# Patient Record
Sex: Female | Born: 1965 | Race: Black or African American | Hispanic: No | Smoking: Former smoker
Health system: Southern US, Community
[De-identification: ages and names within clinical notes are randomized; demographics above are authoritative.]

## PROBLEM LIST (undated history)

## (undated) ENCOUNTER — Ambulatory Visit: Payer: Medicare Other | Source: Home / Self Care

## (undated) DIAGNOSIS — I5020 Unspecified systolic (congestive) heart failure: Secondary | ICD-10-CM

## (undated) DIAGNOSIS — I2510 Atherosclerotic heart disease of native coronary artery without angina pectoris: Secondary | ICD-10-CM

## (undated) DIAGNOSIS — I10 Essential (primary) hypertension: Secondary | ICD-10-CM

## (undated) DIAGNOSIS — E785 Hyperlipidemia, unspecified: Secondary | ICD-10-CM

## (undated) DIAGNOSIS — I471 Supraventricular tachycardia, unspecified: Secondary | ICD-10-CM

## (undated) DIAGNOSIS — I639 Cerebral infarction, unspecified: Secondary | ICD-10-CM

## (undated) HISTORY — PX: KNEE SURGERY: SHX244

## (undated) HISTORY — DX: Supraventricular tachycardia: I47.1

## (undated) HISTORY — PX: CARDIAC CATHETERIZATION: SHX172

## (undated) HISTORY — DX: Supraventricular tachycardia, unspecified: I47.10

---

## 2003-11-26 ENCOUNTER — Other Ambulatory Visit: Payer: Self-pay

## 2005-12-11 ENCOUNTER — Emergency Department: Payer: Self-pay | Admitting: Emergency Medicine

## 2007-06-30 ENCOUNTER — Ambulatory Visit: Payer: Self-pay | Admitting: General Practice

## 2007-10-03 ENCOUNTER — Inpatient Hospital Stay (HOSPITAL_COMMUNITY): Admission: AD | Admit: 2007-10-03 | Discharge: 2007-10-03 | Payer: Self-pay | Admitting: Obstetrics and Gynecology

## 2008-07-05 ENCOUNTER — Ambulatory Visit: Payer: Self-pay | Admitting: General Practice

## 2009-01-14 ENCOUNTER — Emergency Department: Payer: Self-pay | Admitting: Unknown Physician Specialty

## 2009-05-10 ENCOUNTER — Emergency Department (HOSPITAL_COMMUNITY): Admission: EM | Admit: 2009-05-10 | Discharge: 2009-05-10 | Payer: Self-pay | Admitting: Emergency Medicine

## 2009-07-11 ENCOUNTER — Ambulatory Visit: Payer: Self-pay | Admitting: General Practice

## 2010-11-24 ENCOUNTER — Emergency Department (HOSPITAL_COMMUNITY)
Admission: EM | Admit: 2010-11-24 | Discharge: 2010-11-24 | Disposition: A | Discharge status: Home / Self Care | Payer: BC Managed Care – PPO | Attending: Emergency Medicine | Admitting: Emergency Medicine

## 2010-11-24 ENCOUNTER — Emergency Department (HOSPITAL_COMMUNITY): Payer: BC Managed Care – PPO

## 2010-11-24 ENCOUNTER — Encounter (HOSPITAL_COMMUNITY): Payer: Self-pay | Admitting: Radiology

## 2010-11-24 DIAGNOSIS — F3289 Other specified depressive episodes: Secondary | ICD-10-CM | POA: Insufficient documentation

## 2010-11-24 DIAGNOSIS — E119 Type 2 diabetes mellitus without complications: Secondary | ICD-10-CM | POA: Insufficient documentation

## 2010-11-24 DIAGNOSIS — E785 Hyperlipidemia, unspecified: Secondary | ICD-10-CM | POA: Insufficient documentation

## 2010-11-24 DIAGNOSIS — F329 Major depressive disorder, single episode, unspecified: Secondary | ICD-10-CM | POA: Insufficient documentation

## 2010-11-24 DIAGNOSIS — Z79899 Other long term (current) drug therapy: Secondary | ICD-10-CM | POA: Insufficient documentation

## 2010-11-24 DIAGNOSIS — Z7982 Long term (current) use of aspirin: Secondary | ICD-10-CM | POA: Insufficient documentation

## 2010-11-24 DIAGNOSIS — R109 Unspecified abdominal pain: Secondary | ICD-10-CM | POA: Insufficient documentation

## 2010-11-24 DIAGNOSIS — R198 Other specified symptoms and signs involving the digestive system and abdomen: Secondary | ICD-10-CM | POA: Insufficient documentation

## 2010-11-24 LAB — CBC
HCT: 35.4 % — ABNORMAL LOW (ref 36.0–46.0)
Hemoglobin: 11.8 g/dL — ABNORMAL LOW (ref 12.0–15.0)
MCH: 27.4 pg (ref 26.0–34.0)
MCHC: 33.3 g/dL (ref 30.0–36.0)
MCV: 82.1 fL (ref 78.0–100.0)
Platelets: 367 10*3/uL (ref 150–400)
RBC: 4.31 MIL/uL (ref 3.87–5.11)
RDW: 13.6 % (ref 11.5–15.5)
WBC: 9.6 10*3/uL (ref 4.0–10.5)

## 2010-11-24 LAB — COMPREHENSIVE METABOLIC PANEL
ALT: 14 U/L (ref 0–35)
AST: 19 U/L (ref 0–37)
Albumin: 4 g/dL (ref 3.5–5.2)
Alkaline Phosphatase: 52 U/L (ref 39–117)
BUN: 4 mg/dL — ABNORMAL LOW (ref 6–23)
CO2: 26 mEq/L (ref 19–32)
Calcium: 8.9 mg/dL (ref 8.4–10.5)
Chloride: 105 mEq/L (ref 96–112)
Creatinine, Ser: 0.55 mg/dL (ref 0.4–1.2)
GFR calc Af Amer: >60 mL/min (ref >60)
GFR calc non Af Amer: >60 mL/min (ref >60)
Glucose, Bld: 137 mg/dL — ABNORMAL HIGH (ref 70–99)
Potassium: 3.8 mEq/L (ref 3.5–5.1)
Sodium: 137 mEq/L (ref 135–145)
Total Bilirubin: 0.3 mg/dL (ref 0.3–1.2)
Total Protein: 7.6 g/dL (ref 6.0–8.3)

## 2010-11-24 LAB — DIFFERENTIAL
Basophils Absolute: 0 10*3/uL (ref 0.0–0.1)
Basophils Relative: 0 % (ref 0–1)
Eosinophils Absolute: 0.3 10*3/uL (ref 0.0–0.7)
Eosinophils Relative: 3 % (ref 0–5)
Lymphocytes Relative: 22 % (ref 12–46)
Lymphs Abs: 2.2 10*3/uL (ref 0.7–4.0)
Monocytes Absolute: 0.5 10*3/uL (ref 0.1–1.0)
Monocytes Relative: 5 % (ref 3–12)
Neutro Abs: 6.6 10*3/uL (ref 1.7–7.7)
Neutrophils Relative %: 69 % (ref 43–77)

## 2010-11-24 LAB — URINE MICROSCOPIC-ADD ON

## 2010-11-24 LAB — URINALYSIS, ROUTINE W REFLEX MICROSCOPIC
Bilirubin Urine: NEGATIVE
Glucose, UA: NEGATIVE mg/dL
Ketones, ur: NEGATIVE mg/dL
Nitrite: NEGATIVE
Protein, ur: 30 mg/dL — AB
Specific Gravity, Urine: 1.017 (ref 1.005–1.030)
Urobilinogen, UA: 0.2 mg/dL (ref 0.0–1.0)
pH: 6 (ref 5.0–8.0)

## 2010-11-24 LAB — LIPASE, BLOOD: Lipase: 33 U/L (ref 11–59)

## 2010-11-24 LAB — PREGNANCY, URINE: Preg Test, Ur: NEGATIVE

## 2010-11-24 MED ORDER — IOHEXOL 300 MG/ML  SOLN
100.0000 mL | Freq: Once | INTRAMUSCULAR | Status: AC | PRN
  Administered 2010-11-24: 100 mL via INTRAVENOUS

## 2011-05-31 LAB — CBC
HCT: 38.4
Hemoglobin: 13.2
MCHC: 34.3
MCV: 84.3
Platelets: 427 — ABNORMAL HIGH
RBC: 4.55
RDW: 12.9
WBC: 9

## 2011-05-31 LAB — GC/CHLAMYDIA PROBE AMP, GENITAL
Chlamydia, DNA Probe: NEGATIVE
GC Probe Amp, Genital: NEGATIVE

## 2011-05-31 LAB — WET PREP, GENITAL
Clue Cells Wet Prep HPF POC: NONE SEEN
Trich, Wet Prep: NONE SEEN
Yeast Wet Prep HPF POC: NONE SEEN

## 2011-05-31 LAB — DIFFERENTIAL
Basophils Absolute: 0.1
Basophils Relative: 1
Eosinophils Absolute: 0.2
Eosinophils Relative: 3
Lymphocytes Relative: 20
Lymphs Abs: 1.8
Monocytes Absolute: 0.4
Monocytes Relative: 5
Neutro Abs: 6.5
Neutrophils Relative %: 72

## 2011-05-31 LAB — URINE MICROSCOPIC-ADD ON

## 2011-05-31 LAB — POCT PREGNANCY, URINE
Operator id: 11741
Preg Test, Ur: NEGATIVE

## 2011-05-31 LAB — URINALYSIS, ROUTINE W REFLEX MICROSCOPIC
Bilirubin Urine: NEGATIVE
Glucose, UA: NEGATIVE
Ketones, ur: NEGATIVE
Nitrite: NEGATIVE
Protein, ur: NEGATIVE
Specific Gravity, Urine: >1.03 — ABNORMAL HIGH
Urobilinogen, UA: 0.2
pH: 5.5

## 2011-07-02 ENCOUNTER — Ambulatory Visit: Payer: Self-pay

## 2012-06-30 ENCOUNTER — Ambulatory Visit: Payer: Self-pay

## 2014-01-18 ENCOUNTER — Emergency Department: Payer: Self-pay | Admitting: Emergency Medicine

## 2014-01-24 ENCOUNTER — Emergency Department: Payer: Self-pay | Admitting: Emergency Medicine

## 2015-06-21 ENCOUNTER — Encounter: Payer: Self-pay | Admitting: *Deleted

## 2015-06-21 ENCOUNTER — Emergency Department
Admission: EM | Admit: 2015-06-21 | Discharge: 2015-06-21 | Disposition: A | Discharge status: Home / Self Care | Payer: Self-pay | Attending: Emergency Medicine | Admitting: Emergency Medicine

## 2015-06-21 DIAGNOSIS — E119 Type 2 diabetes mellitus without complications: Secondary | ICD-10-CM | POA: Insufficient documentation

## 2015-06-21 DIAGNOSIS — Z87891 Personal history of nicotine dependence: Secondary | ICD-10-CM | POA: Insufficient documentation

## 2015-06-21 DIAGNOSIS — I10 Essential (primary) hypertension: Secondary | ICD-10-CM | POA: Insufficient documentation

## 2015-06-21 DIAGNOSIS — M62830 Muscle spasm of back: Secondary | ICD-10-CM | POA: Insufficient documentation

## 2015-06-21 HISTORY — DX: Essential (primary) hypertension: I10

## 2015-06-21 MED ORDER — DIAZEPAM 2 MG PO TABS: 2.0000 mg | ORAL_TABLET | Freq: Three times a day (TID) | ORAL | Status: AC | PRN

## 2015-06-21 MED ORDER — KETOROLAC TROMETHAMINE 60 MG/2ML IM SOLN
60.0000 mg | Freq: Once | INTRAMUSCULAR | Status: AC
  Administered 2015-06-21: 60 mg via INTRAMUSCULAR
  Filled 2015-06-21: qty 2

## 2015-06-21 MED ORDER — KETOROLAC TROMETHAMINE 10 MG PO TABS: 10.0000 mg | ORAL_TABLET | Freq: Four times a day (QID) | ORAL | Status: DC | PRN

## 2015-06-21 NOTE — ED Notes (Signed)
Pt reports pain in the middle of her back. Started last night while laying.

## 2015-06-21 NOTE — ED Provider Notes (Signed)
[facility] Emergency Department Provider Note ____________________________________________  Time seen: Approximately 5:07 PM  I have reviewed the triage vital signs and the nursing notes.   HISTORY  Chief Complaint Back Pain    HPI Stacey Richardson is a 49 y.o. female who presents to the emergency department for acute onset of right-sided mid back pain. She states that she was awakened by the pain last night, and the pain has not improved throughout the day. She denies injury. She denies increase in activity level. She states that the pain is worse with moving and lying down. She's never had this type of pain in the past.   Past Medical History  Diagnosis Date  . Diabetes mellitus   . Hypertension     There are no active problems to display for this patient.   Past Surgical History  Procedure Laterality Date  . Knee surgery      Current Outpatient Rx  Name  Route  Sig  Dispense  Refill  . diazepam (VALIUM) 2 MG tablet   Oral   Take 1 tablet (2 mg total) by mouth every 8 (eight) hours as needed for anxiety.   30 tablet   0   . ketorolac (TORADOL) 10 MG tablet   Oral   Take 1 tablet (10 mg total) by mouth every 6 (six) hours as needed.   20 tablet   0     Allergies Review of patient's allergies indicates no known allergies.  No family history on file.  Social History Social History  Substance Use Topics  . Smoking status: Former Research scientist (life sciences)  . Smokeless tobacco: None  . Alcohol Use: Yes    Review of Systems Constitutional: No recent illness. Eyes: No visual changes. ENT: No sore throat. Cardiovascular: Denies chest pain or palpitations. Respiratory: Denies shortness of breath. Gastrointestinal: No abdominal pain.  Genitourinary: Negative for dysuria. Musculoskeletal: Pain in right mid back. Skin: Negative for rash. Neurological: Negative for headaches, focal weakness or numbness. 10-point ROS otherwise  negative.  ____________________________________________   PHYSICAL EXAM:  VITAL SIGNS: ED Triage Vitals  Enc Vitals Group     BP 06/21/15 1709 148/92 mmHg     Pulse Rate 06/21/15 1709 107     Resp 06/21/15 1709 16     Temp 06/21/15 1709 98.1 F (36.7 C)     Temp Source 06/21/15 1709 Oral     SpO2 06/21/15 1709 99 %     Weight 06/21/15 1709 207 lb (93.895 kg)     Height 06/21/15 1709 5\' 6"  (1.676 m)     Head Cir --      Peak Flow --      Pain Score 06/21/15 1710 8     Pain Loc --      Pain Edu? --      Excl. in [place]? --     Constitutional: Alert and oriented. Well appearing and in no acute distress. Eyes: Conjunctivae are normal. EOMI. Head: Atraumatic. Nose: No congestion/rhinnorhea. Neck: No stridor.  Respiratory: Normal respiratory effort.   Musculoskeletal: Palpable muscle spasm noted in the trapezius area on the right in comparison with the left. Neurologic:  Normal speech and language. No gross focal neurologic deficits are appreciated. Speech is normal. No gait instability. Skin:  Skin is warm, dry and intact. Atraumatic. Psychiatric: Mood and affect are normal. Speech and behavior are normal.  ____________________________________________   LABS (all labs ordered are listed, but only abnormal results are displayed)  Labs Reviewed -  No data to display ____________________________________________  RADIOLOGY  Not indicated ____________________________________________   PROCEDURES  Procedure(s) performed: None   ____________________________________________   INITIAL IMPRESSION / ASSESSMENT AND PLAN / ED COURSE  Pertinent labs & imaging results that were available during my care of the patient were reviewed by me and considered in my medical decision making (see chart for details).  IM Toradol was given while in the emergency department. Patient is to take medications as prescribed and follow up with primary care for symptoms that are not improving over  the next 2 days. ____________________________________________   FINAL CLINICAL IMPRESSION(S) / ED DIAGNOSES  Final diagnoses:  Muscle spasm of back       Victorino Dike, FNP 06/21/15 1821  Nance Pear, MD 06/21/15 [phone]

## 2017-07-09 ENCOUNTER — Encounter (HOSPITAL_COMMUNITY): Payer: Self-pay | Admitting: Emergency Medicine

## 2017-07-09 ENCOUNTER — Emergency Department (HOSPITAL_COMMUNITY): Payer: Self-pay

## 2017-07-09 ENCOUNTER — Emergency Department (HOSPITAL_COMMUNITY)
Admission: EM | Admit: 2017-07-09 | Discharge: 2017-07-09 | Disposition: A | Discharge status: Home / Self Care | Payer: Self-pay | Attending: Emergency Medicine | Admitting: Emergency Medicine

## 2017-07-09 DIAGNOSIS — R42 Dizziness and giddiness: Secondary | ICD-10-CM

## 2017-07-09 DIAGNOSIS — E119 Type 2 diabetes mellitus without complications: Secondary | ICD-10-CM | POA: Insufficient documentation

## 2017-07-09 DIAGNOSIS — I10 Essential (primary) hypertension: Secondary | ICD-10-CM | POA: Insufficient documentation

## 2017-07-09 DIAGNOSIS — Z87891 Personal history of nicotine dependence: Secondary | ICD-10-CM | POA: Insufficient documentation

## 2017-07-09 LAB — BASIC METABOLIC PANEL
Anion gap: 10 (ref 5–15)
BUN: 8 mg/dL (ref 6–20)
CO2: 25 mmol/L (ref 22–32)
Calcium: 9.5 mg/dL (ref 8.9–10.3)
Chloride: 103 mmol/L (ref 101–111)
Creatinine, Ser: 0.56 mg/dL (ref 0.44–1.00)
GFR calc Af Amer: >60 mL/min (ref >60)
GFR calc non Af Amer: >60 mL/min (ref >60)
Glucose, Bld: 95 mg/dL (ref 65–99)
Potassium: 3.7 mmol/L (ref 3.5–5.1)
Sodium: 138 mmol/L (ref 135–145)

## 2017-07-09 LAB — CBC WITH DIFFERENTIAL/PLATELET
Basophils Absolute: 0 10*3/uL (ref 0.0–0.1)
Basophils Relative: 0 %
Eosinophils Absolute: 0.3 10*3/uL (ref 0.0–0.7)
Eosinophils Relative: 3 %
HCT: 33.9 % — ABNORMAL LOW (ref 36.0–46.0)
Hemoglobin: 10.8 g/dL — ABNORMAL LOW (ref 12.0–15.0)
Lymphocytes Relative: 29 %
Lymphs Abs: 2.8 10*3/uL (ref 0.7–4.0)
MCH: 24.8 pg — ABNORMAL LOW (ref 26.0–34.0)
MCHC: 31.9 g/dL (ref 30.0–36.0)
MCV: 77.9 fL — ABNORMAL LOW (ref 78.0–100.0)
Monocytes Absolute: 0.7 10*3/uL (ref 0.1–1.0)
Monocytes Relative: 7 %
Neutro Abs: 5.8 10*3/uL (ref 1.7–7.7)
Neutrophils Relative %: 61 %
Platelets: 443 10*3/uL — ABNORMAL HIGH (ref 150–400)
RBC: 4.35 MIL/uL (ref 3.87–5.11)
RDW: 14.2 % (ref 11.5–15.5)
WBC: 9.6 10*3/uL (ref 4.0–10.5)

## 2017-07-09 LAB — URINALYSIS, ROUTINE W REFLEX MICROSCOPIC
Bilirubin Urine: NEGATIVE
Glucose, UA: NEGATIVE mg/dL
Hgb urine dipstick: NEGATIVE
Ketones, ur: NEGATIVE mg/dL
Nitrite: NEGATIVE
Protein, ur: NEGATIVE mg/dL
Specific Gravity, Urine: 1.02 (ref 1.005–1.030)
pH: 6 (ref 5.0–8.0)

## 2017-07-09 LAB — CBG MONITORING, ED: Glucose-Capillary: 133 mg/dL — ABNORMAL HIGH (ref 65–99)

## 2017-07-09 LAB — TROPONIN I: Troponin I: <0.03 ng/mL (ref <0.03)

## 2017-07-09 LAB — PREGNANCY, URINE: Preg Test, Ur: NEGATIVE

## 2017-07-09 NOTE — ED Triage Notes (Signed)
Pt reports dizziness x3 days. Pt reports has been on blood pressure medication in the past but was taken off it "years ago". Pt reports high blood pressure readings for last several weeks. Pt denies pain,shortness of breath. nad noted.

## 2017-07-09 NOTE — Discharge Instructions (Signed)
Take your usual prescriptions as previously directed.  Take your blood pressure only once per day, a few days per week, either in the morning or in the evening before you go to bed.  Always sit quietly for at least 15 to 20 minutes before taking your blood pressure.  Keep a diary of your blood pressures to show your doctor at your follow up office visit.  Call your regular medical doctor tomorrow morning to schedule a follow up appointment in the next 2 to 3 days.  Return to the Emergency Department immediately sooner if worsening.

## 2017-07-09 NOTE — ED Provider Notes (Signed)
[facility] EMERGENCY DEPARTMENT Provider Note   CSN: [id number] Arrival date & time: 07/09/17  1743     History   Chief Complaint Chief Complaint  Patient presents with  . Dizziness    HPI Stacey Richardson is a 51 y.o. female.  HPI  Pt was seen at 1830. Per pt, c/o gradual onset and persistence of multiple intermittent episodes of "high blood pressure" for the past several weeks. Pt states she "used to be on BP meds," but stopped taking them several years ago. 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    Past Medical History:  Diagnosis Date  . Diabetes mellitus   . Hypertension     There are no active problems to display for this patient.   Past Surgical History:  Procedure Laterality Date  . KNEE SURGERY      OB History    No data available       Home Medications    Prior to Admission medications   Medication Sig Start Date End Date Taking? Authorizing Provider  ketorolac (TORADOL) 10 MG tablet Take 1 tablet (10 mg total) by mouth every 6 (six) hours as needed. 06/21/15   Victorino Dike, FNP    Family History History reviewed. No pertinent family history.  Social History Social History  Substance Use Topics  . Smoking status: Former Research scientist (life sciences)  . Smokeless tobacco: Never Used  . Alcohol use Yes     Allergies   Patient has no known allergies.   Review of Systems Review of Systems ROS: Statement: All systems negative except as marked or noted in the HPI; Constitutional: Negative for fever and chills. ; ; Eyes: Negative for eye pain, redness and discharge. ; ; ENMT: Negative for ear pain, hoarseness, nasal  congestion, sinus pressure and sore throat. ; ; Cardiovascular: Negative for chest pain, palpitations, diaphoresis, dyspnea and peripheral edema. ; ; Respiratory: Negative for cough, wheezing and stridor. ; ; Gastrointestinal: Negative for nausea, vomiting, diarrhea, abdominal pain, blood in stool, hematemesis, jaundice and rectal bleeding. . ; ; Genitourinary: Negative for dysuria, flank pain and hematuria. ; ; Musculoskeletal: Negative for back pain and neck pain. Negative for swelling and trauma.; ; Skin: Negative for pruritus, rash, abrasions, blisters, bruising and skin lesion.; ; Neuro: +lightheadedness. Negative for headache and neck stiffness. Negative for weakness, altered level of consciousness, altered mental status, extremity weakness, paresthesias, involuntary movement, seizure and syncope.       Physical Exam Updated Vital Signs BP (!) 154/83 (BP Location: Right Arm)   Pulse (!) 103   Temp 98.5 F (36.9 C) (Oral)   Resp 18   Ht 5\' 5"  (1.651 m)   Wt 89.4 kg (197 lb)   LMP 06/29/2017   SpO2 100%   BMI 32.78 kg/m   BP (!) 141/80 (BP Location: Right Arm)   Pulse (!) 104   Temp 98.2 F (36.8 C) (Oral)   Resp 18   Ht 5\' 5"  (1.651 m)   Wt 89.4 kg (197 lb)   LMP 06/29/2017   SpO2 98%   BMI 32.78 kg/m    Physical Exam 1835: Physical examination:  Nursing  notes reviewed; Vital signs and O2 SAT reviewed;  Constitutional: Well developed, Well nourished, Well hydrated, In no acute distress; Head:  Normocephalic, atraumatic; Eyes: EOMI, PERRL, No scleral icterus; ENMT: TM's clear bilat. Mouth and pharynx normal, Mucous membranes moist; Neck: Supple, Full range of motion, No lymphadenopathy; Cardiovascular: Regular rate and rhythm, No gallop; Respiratory: Breath sounds clear & equal bilaterally, No wheezes.  Speaking full sentences with ease, Normal respiratory effort/excursion; Chest: Nontender, Movement normal; Abdomen: Soft, Nontender, Nondistended, Normal bowel sounds;  Genitourinary: No CVA tenderness; Extremities: Pulses normal, No tenderness, No edema, No calf edema or asymmetry.; Neuro: AA&Ox3, Major CN grossly intact.  No facial droop. Speech clear. No gross focal motor or sensory deficits in extremities.; Skin: Color normal, Warm, Dry.   ED Treatments / Results  Labs (all labs ordered are listed, but only abnormal results are displayed)   EKG  EKG Interpretation None       Radiology   Procedures Procedures (including critical care time)  Medications Ordered in ED Medications - No data to display   Initial Impression / Assessment and Plan / ED Course  I have reviewed the triage vital signs and the nursing notes.  Pertinent labs & imaging results that were available during my care of the patient were reviewed by me and considered in my medical decision making (see chart for details).  MDM Reviewed: previous chart, nursing note and vitals Reviewed previous: labs and ECG Interpretation: labs, ECG, x-ray and CT scan   ED ECG REPORT   Date: 07/09/2017  Rate: 109  Rhythm: sinus tachycardia  QRS Axis: normal  Intervals: normal  ST/T Wave abnormalities: normal  Conduction Disutrbances:none  Narrative Interpretation:   Old EKG Reviewed: changes noted; compared to EKG dated 11/26/2003, rate is faster. I have personally reviewed the EKG tracing and agree with the computerized printout as noted.   Results for orders placed or performed during the hospital encounter of 79/02/40  Basic metabolic panel  Result Value Ref Range   Sodium 138 135 - 145 mmol/L   Potassium 3.7 3.5 - 5.1 mmol/L   Chloride 103 101 - 111 mmol/L   CO2 25 22 - 32 mmol/L   Glucose, Bld 95 65 - 99 mg/dL   BUN 8 6 - 20 mg/dL   Creatinine, Ser 0.56 0.44 - 1.00 mg/dL   Calcium 9.5 8.9 - 10.3 mg/dL   GFR calc non Af Amer >60 >60 mL/min   GFR calc Af Amer >60 >60 mL/min   Anion gap 10 5 - 15  CBC with Differential  Result Value Ref Range   WBC 9.6 4.0 - 10.5  K/uL   RBC 4.35 3.87 - 5.11 MIL/uL   Hemoglobin 10.8 (L) 12.0 - 15.0 g/dL   HCT 33.9 (L) 36.0 - 46.0 %   MCV 77.9 (L) 78.0 - 100.0 fL   MCH 24.8 (L) 26.0 - 34.0 pg   MCHC 31.9 30.0 - 36.0 g/dL   RDW 14.2 11.5 - 15.5 %   Platelets 443 (H) 150 - 400 K/uL   Neutrophils Relative % 61 %   Neutro Abs 5.8 1.7 - 7.7 K/uL   Lymphocytes Relative 29 %   Lymphs Abs 2.8 0.7 - 4.0 K/uL   Monocytes Relative 7 %   Monocytes Absolute 0.7 0.1 - 1.0 K/uL   Eosinophils Relative 3 %   Eosinophils Absolute 0.3 0.0 - 0.7 K/uL   Basophils Relative 0 %   Basophils Absolute 0.0 0.0 - 0.1 K/uL  Troponin I  Result Value Ref Range   Troponin I <0.03 <0.03 ng/mL  Urinalysis, Routine w reflex microscopic  Result Value Ref Range   Color, Urine YELLOW YELLOW   APPearance HAZY (A) CLEAR   Specific Gravity, Urine 1.020 1.005 - 1.030   pH 6.0 5.0 - 8.0   Glucose, UA NEGATIVE NEGATIVE mg/dL   Hgb urine dipstick NEGATIVE NEGATIVE   Bilirubin Urine NEGATIVE NEGATIVE   Ketones, ur NEGATIVE NEGATIVE mg/dL   Protein, ur NEGATIVE NEGATIVE mg/dL   Nitrite NEGATIVE NEGATIVE   Leukocytes, UA TRACE (A) NEGATIVE   RBC / HPF 0-5 0 - 5 RBC/hpf   WBC, UA 0-5 0 - 5 WBC/hpf   Bacteria, UA RARE (A) NONE SEEN   Squamous Epithelial / LPF 0-5 (A) NONE SEEN   Mucus PRESENT   Pregnancy, urine  Result Value Ref Range   Preg Test, Ur NEGATIVE NEGATIVE  CBG monitoring, ED  Result Value Ref Range   Glucose-Capillary 133 (H) 65 - 99 mg/dL   Dg Chest 2 View Result Date: 07/09/2017 CLINICAL DATA:  51 year old female with hypertension and chest complaints. EXAM: CHEST  2 VIEW COMPARISON:  None. FINDINGS: The heart size and mediastinal contours are within normal limits. Both lungs are clear. The visualized skeletal structures are unremarkable. IMPRESSION: No active cardiopulmonary disease. Electronically Signed   By: Anner Crete M.D.   On: 07/09/2017 19:42   Ct Head Wo Contrast Result Date: 07/09/2017 CLINICAL DATA:  51 y/o   F; 3 days of dizziness. EXAM: CT HEAD WITHOUT CONTRAST TECHNIQUE: Contiguous axial images were obtained from the base of the skull through the vertex without intravenous contrast. COMPARISON:  None. FINDINGS: Brain: No evidence of acute infarction, hemorrhage, hydrocephalus, extra-axial collection or mass lesion/mass effect. Vascular: No hyperdense vessel or unexpected calcification. Skull: Normal. Negative for fracture or focal lesion. Sinuses/Orbits: No acute finding. Other: None. IMPRESSION: No acute intracranial abnormality identified. Unremarkable CT of the head. Electronically Signed   By: Kristine Garbe M.D.   On: 07/09/2017 19:47    2100:  BP stable in ED. Workup reassuring. Long d/w pt regarding BP control, including, but not limited to:  Checking home BP monitor at doctor's office to compare readings, not taking BP multiple times per day/every day, taking same time of day after a period of sitting quietly, etc. Pt verb understanding, states she is ready to go home now. Dx and testing d/w pt.  Questions answered.  Verb understanding, agreeable to d/c home with outpt f/u.   Final Clinical Impressions(s) / ED Diagnoses   Final diagnoses:  None    New Prescriptions New Prescriptions   No medications on file     Francine Graven, DO 07/13/17 1322

## 2018-10-06 ENCOUNTER — Other Ambulatory Visit: Payer: Self-pay | Admitting: Family Medicine

## 2018-10-06 DIAGNOSIS — Z1231 Encounter for screening mammogram for malignant neoplasm of breast: Secondary | ICD-10-CM

## 2019-01-20 ENCOUNTER — Ambulatory Visit: Payer: Medicaid Other

## 2019-01-22 ENCOUNTER — Other Ambulatory Visit: Payer: Self-pay

## 2019-01-22 ENCOUNTER — Inpatient Hospital Stay (HOSPITAL_COMMUNITY)
Admission: EM | Admit: 2019-01-22 | Discharge: 2019-01-24 | DRG: 195 | Disposition: A | Discharge status: Home / Self Care | Payer: Self-pay | Attending: Family Medicine | Admitting: Family Medicine

## 2019-01-22 ENCOUNTER — Emergency Department (HOSPITAL_COMMUNITY): Payer: Self-pay

## 2019-01-22 ENCOUNTER — Encounter (HOSPITAL_COMMUNITY): Payer: Self-pay | Admitting: Emergency Medicine

## 2019-01-22 DIAGNOSIS — Z87891 Personal history of nicotine dependence: Secondary | ICD-10-CM

## 2019-01-22 DIAGNOSIS — A419 Sepsis, unspecified organism: Secondary | ICD-10-CM

## 2019-01-22 DIAGNOSIS — J189 Pneumonia, unspecified organism: Principal | ICD-10-CM | POA: Diagnosis present

## 2019-01-22 DIAGNOSIS — E119 Type 2 diabetes mellitus without complications: Secondary | ICD-10-CM

## 2019-01-22 DIAGNOSIS — I10 Essential (primary) hypertension: Secondary | ICD-10-CM | POA: Diagnosis present

## 2019-01-22 DIAGNOSIS — R0902 Hypoxemia: Secondary | ICD-10-CM | POA: Diagnosis present

## 2019-01-22 DIAGNOSIS — Z79899 Other long term (current) drug therapy: Secondary | ICD-10-CM

## 2019-01-22 DIAGNOSIS — Z20828 Contact with and (suspected) exposure to other viral communicable diseases: Secondary | ICD-10-CM | POA: Diagnosis present

## 2019-01-22 LAB — SARS CORONAVIRUS 2 BY RT PCR (HOSPITAL ORDER, PERFORMED IN ~~LOC~~ HOSPITAL LAB): SARS Coronavirus 2: NEGATIVE

## 2019-01-22 LAB — COMPREHENSIVE METABOLIC PANEL
ALT: 14 U/L (ref 0–44)
AST: 14 U/L — ABNORMAL LOW (ref 15–41)
Albumin: 4.1 g/dL (ref 3.5–5.0)
Alkaline Phosphatase: 52 U/L (ref 38–126)
Anion gap: 11 (ref 5–15)
BUN: 8 mg/dL (ref 6–20)
CO2: 20 mmol/L — ABNORMAL LOW (ref 22–32)
Calcium: 9.1 mg/dL (ref 8.9–10.3)
Chloride: 106 mmol/L (ref 98–111)
Creatinine, Ser: 0.54 mg/dL (ref 0.44–1.00)
GFR calc Af Amer: >60 mL/min (ref >60)
GFR calc non Af Amer: >60 mL/min (ref >60)
Glucose, Bld: 100 mg/dL — ABNORMAL HIGH (ref 70–99)
Potassium: 3.7 mmol/L (ref 3.5–5.1)
Sodium: 137 mmol/L (ref 135–145)
Total Bilirubin: 0.3 mg/dL (ref 0.3–1.2)
Total Protein: 7.6 g/dL (ref 6.5–8.1)

## 2019-01-22 LAB — GLUCOSE, CAPILLARY: Glucose-Capillary: 136 mg/dL — ABNORMAL HIGH (ref 70–99)

## 2019-01-22 LAB — CBC WITH DIFFERENTIAL/PLATELET
Abs Immature Granulocytes: 0.15 10*3/uL — ABNORMAL HIGH (ref 0.00–0.07)
Basophils Absolute: 0.1 10*3/uL (ref 0.0–0.1)
Basophils Relative: 1 %
Eosinophils Absolute: 0.3 10*3/uL (ref 0.0–0.5)
Eosinophils Relative: 4 %
HCT: 34.9 % — ABNORMAL LOW (ref 36.0–46.0)
Hemoglobin: 9.5 g/dL — ABNORMAL LOW (ref 12.0–15.0)
Immature Granulocytes: 2 %
Lymphocytes Relative: 19 %
Lymphs Abs: 1.4 10*3/uL (ref 0.7–4.0)
MCH: 20.5 pg — ABNORMAL LOW (ref 26.0–34.0)
MCHC: 27.2 g/dL — ABNORMAL LOW (ref 30.0–36.0)
MCV: 75.4 fL — ABNORMAL LOW (ref 80.0–100.0)
Monocytes Absolute: 0.4 10*3/uL (ref 0.1–1.0)
Monocytes Relative: 6 %
Neutro Abs: 5 10*3/uL (ref 1.7–7.7)
Neutrophils Relative %: 68 %
Platelets: 465 10*3/uL — ABNORMAL HIGH (ref 150–400)
RBC: 4.63 MIL/uL (ref 3.87–5.11)
RDW: 25.2 % — ABNORMAL HIGH (ref 11.5–15.5)
WBC: 7.3 10*3/uL (ref 4.0–10.5)
nRBC: 0 % (ref 0.0–0.2)

## 2019-01-22 LAB — URINALYSIS, ROUTINE W REFLEX MICROSCOPIC
Bilirubin Urine: NEGATIVE
Glucose, UA: NEGATIVE mg/dL
Hgb urine dipstick: NEGATIVE
Ketones, ur: NEGATIVE mg/dL
Leukocytes,Ua: NEGATIVE
Nitrite: NEGATIVE
Protein, ur: NEGATIVE mg/dL
Specific Gravity, Urine: 1.033 — ABNORMAL HIGH (ref 1.005–1.030)
pH: 5 (ref 5.0–8.0)

## 2019-01-22 LAB — LACTIC ACID, PLASMA: Lactic Acid, Venous: 1.2 mmol/L (ref 0.5–1.9)

## 2019-01-22 LAB — TROPONIN I: Troponin I: <0.03 ng/mL (ref <0.03)

## 2019-01-22 LAB — SEDIMENTATION RATE: Sed Rate: 12 mm/hr (ref 0–22)

## 2019-01-22 LAB — D-DIMER, QUANTITATIVE: D-Dimer, Quant: 1.7 ug/mL-FEU — ABNORMAL HIGH (ref 0.00–0.50)

## 2019-01-22 LAB — C-REACTIVE PROTEIN: CRP: <0.8 mg/dL (ref <1.0)

## 2019-01-22 LAB — BRAIN NATRIURETIC PEPTIDE: B Natriuretic Peptide: 150 pg/mL — ABNORMAL HIGH (ref 0.0–100.0)

## 2019-01-22 LAB — STREP PNEUMONIAE URINARY ANTIGEN: Strep Pneumo Urinary Antigen: NEGATIVE

## 2019-01-22 LAB — PROCALCITONIN: Procalcitonin: <0.1 ng/mL

## 2019-01-22 MED ORDER — IOHEXOL 350 MG/ML SOLN
75.0000 mL | Freq: Once | INTRAVENOUS | Status: AC | PRN
  Administered 2019-01-22: 75 mL via INTRAVENOUS

## 2019-01-22 MED ORDER — INSULIN ASPART 100 UNIT/ML ~~LOC~~ SOLN: 0.0000 [IU] | Freq: Every day | SUBCUTANEOUS | Status: DC

## 2019-01-22 MED ORDER — SODIUM CHLORIDE 0.9 % IV SOLN
1.0000 g | INTRAVENOUS | Status: DC
  Administered 2019-01-23: 1 g via INTRAVENOUS
  Filled 2019-01-22: qty 10
  Filled 2019-01-22: qty 1

## 2019-01-22 MED ORDER — SODIUM CHLORIDE 0.9 % IV SOLN
1.0000 g | Freq: Once | INTRAVENOUS | Status: AC
  Administered 2019-01-22: 1 g via INTRAVENOUS
  Filled 2019-01-22: qty 10

## 2019-01-22 MED ORDER — SODIUM CHLORIDE 0.9 % IV SOLN
500.0000 mg | INTRAVENOUS | Status: DC
  Administered 2019-01-23: 500 mg via INTRAVENOUS
  Filled 2019-01-22: qty 500

## 2019-01-22 MED ORDER — ENOXAPARIN SODIUM 40 MG/0.4ML ~~LOC~~ SOLN
40.0000 mg | SUBCUTANEOUS | Status: DC
  Administered 2019-01-22 – 2019-01-23 (×2): 40 mg via SUBCUTANEOUS
  Filled 2019-01-22 (×2): qty 0.4

## 2019-01-22 MED ORDER — INSULIN ASPART 100 UNIT/ML ~~LOC~~ SOLN
0.0000 [IU] | Freq: Three times a day (TID) | SUBCUTANEOUS | Status: DC
  Administered 2019-01-23: 2 [IU] via SUBCUTANEOUS

## 2019-01-22 MED ORDER — AZITHROMYCIN 250 MG PO TABS
500.0000 mg | ORAL_TABLET | Freq: Once | ORAL | Status: AC
  Administered 2019-01-22: 500 mg via ORAL
  Filled 2019-01-22: qty 2

## 2019-01-22 NOTE — H&P (Signed)
History and Physical  Stacey Richardson IZT:[id number] DOB: [birth year] DOA: 01/22/2019  Referring physician: Dr Sabra Heck, ED physician PCP: Theotis Burrow, MD  Outpatient Specialists:   Patient Coming From: home  Chief Complaint: SOB, cough  HPI: Stacey Richardson is a 53 y.o. female with a history of diabetes, hypertension who presents to the hospital with mild shortness of breath and cough that started this morning and is rapidly progressed.  Shortness of breath worse with exertion and improved with rest.  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  There have been no reported cases at her work.  She denies any close contacts with known COVID 19 patients.  Cough is mildly productive.  She reports no fever, ills, nausea, vomiting, diarrhea, constipation, diminished sense of smell or taste.  Does report fatigue.  Emergency Department Course: The emergency department, her oxygen saturation ranges from 88 to 94% on room air.  With exertion or long conversations, her oxygen saturation decreases to 88 to 92%.  COVID-19 test negative.  Chest x-ray positive for bilateral pneumonia.  CTA done which shows multifocal pneumonia with groundglass opacities.  Additionally, no PE was seen.  White count is 7.3.  D-dimer slightly elevated.  Review of Systems:   Pt denies any fevers, chills, nausea, vomiting, diarrhea, constipation, abdominal pain, shortness of breath, dyspnea on exertion, orthopnea, cough, wheezing, palpitations, headache, vision changes, lightheadedness, dizziness, melena, rectal bleeding.  Review of systems are otherwise negative  Past Medical History:  Diagnosis Date   Diabetes mellitus    Hypertension    Past Surgical History:  Procedure Laterality Date   KNEE SURGERY     Social History:  reports that she has quit smoking. She has never used smokeless tobacco. She reports current alcohol use. She  reports that she does not use drugs. Patient lives at home  No Known Allergies  History reviewed. No pertinent family history.  No family history of lung disease.  Prior to Admission medications   Medication Sig Start Date End Date Taking? Authorizing Provider  ketorolac (TORADOL) 10 MG tablet Take 1 tablet (10 mg total) by mouth every 6 (six) hours as needed. 06/21/15   Victorino Dike, FNP    Physical Exam: BP (!) 120/97 (BP Location: Left Arm)    Pulse (!) 102    Temp 98.4 F (36.9 C) (Oral)    Resp 18    Ht 5' 5" (1.651 m)    Wt 89.8 kg    LMP 12/30/2018 (Approximate)    SpO2 94%    BMI 32.95 kg/m    General: Middle-aged black female. Awake and alert and oriented x3. No acute cardiopulmonary distress.   HEENT: Normocephalic atraumatic.  Right and left ears normal in appearance.  Pupils equal, round, reactive to light. Extraocular muscles are intact. Sclerae anicteric and noninjected.  Moist mucosal membranes. No mucosal lesions.   Neck: Neck supple without lymphadenopathy. No carotid bruits. No masses palpated.   Cardiovascular: Regular rate with normal S1-S2 sounds. No murmurs, rubs, gallops auscultated. No JVD.   Respiratory: Rales in bases bilaterally.  No wheezing or rhonchi.  No accessory muscle use.  Abdomen: Soft, nontender, nondistended. Active bowel sounds. No masses or hepatosplenomegaly   Skin: No rashes, lesions, or ulcerations.  Dry, warm to touch. 2+ dorsalis pedis and radial pulses.  Musculoskeletal: No calf or leg pain. All major joints not erythematous nontender.  No  upper or lower joint deformation.  Good ROM.  No contractures   Psychiatric: Intact judgment and insight. Pleasant and cooperative.  Neurologic: No focal neurological deficits. Strength is 5/5 and symmetric in upper and lower extremities.  Cranial nerves II through XII are grossly intact.           Labs on Admission: I have personally reviewed following labs and imaging  studies  CBC: Recent Labs  Lab 01/22/19 1302  WBC 7.3  NEUTROABS 5.0  HGB 9.5*  HCT 34.9*  MCV 75.4*  PLT 983*   Basic Metabolic Panel: Recent Labs  Lab 01/22/19 1302  NA 137  K 3.7  CL 106  CO2 20*  GLUCOSE 100*  BUN 8  CREATININE 0.54  CALCIUM 9.1   GFR: Estimated Creatinine Clearance: 92.1 mL/min (by C-G formula based on SCr of 0.54 mg/dL). Liver Function Tests: Recent Labs  Lab 01/22/19 1302  AST 14*  ALT 14  ALKPHOS 52  BILITOT 0.3  PROT 7.6  ALBUMIN 4.1   No results for input(s): LIPASE, AMYLASE in the last 168 hours. No results for input(s): AMMONIA in the last 168 hours. Coagulation Profile: No results for input(s): INR, PROTIME in the last 168 hours. Cardiac Enzymes: Recent Labs  Lab 01/22/19 1302  TROPONINI <0.03   BNP (last 3 results) No results for input(s): PROBNP in the last 8760 hours. HbA1C: No results for input(s): HGBA1C in the last 72 hours. CBG: No results for input(s): GLUCAP in the last 168 hours. Lipid Profile: No results for input(s): CHOL, HDL, LDLCALC, TRIG, CHOLHDL, LDLDIRECT in the last 72 hours. Thyroid Function Tests: No results for input(s): TSH, T4TOTAL, FREET4, T3FREE, THYROIDAB in the last 72 hours. Anemia Panel: No results for input(s): VITAMINB12, FOLATE, FERRITIN, TIBC, IRON, RETICCTPCT in the last 72 hours. Urine analysis:    Component Value Date/Time   COLORURINE YELLOW 07/09/2017 1920   APPEARANCEUR HAZY (A) 07/09/2017 1920   LABSPEC 1.020 07/09/2017 1920   PHURINE 6.0 07/09/2017 1920   GLUCOSEU NEGATIVE 07/09/2017 1920   HGBUR NEGATIVE 07/09/2017 1920   BILIRUBINUR NEGATIVE 07/09/2017 1920   KETONESUR NEGATIVE 07/09/2017 1920   PROTEINUR NEGATIVE 07/09/2017 1920   UROBILINOGEN 0.2 11/24/2010 1509   NITRITE NEGATIVE 07/09/2017 1920   LEUKOCYTESUR TRACE (A) 07/09/2017 1920   Sepsis Labs: _0 (procalcitonin:4,lacticidven:4) ) Recent Results (from the past 240 hour(s))  SARS Coronavirus 2  (CEPHEID- Performed in [facility] hospital lab), Hosp Order     Status: None   Collection Time: 01/22/19  1:03 PM  Result Value Ref Range Status   SARS Coronavirus 2 NEGATIVE NEGATIVE Final    Comment: (NOTE) If result is NEGATIVE SARS-CoV-2 target nucleic acids are NOT DETECTED. The SARS-CoV-2 RNA is generally detectable in upper and lower  respiratory specimens during the acute phase of infection. The lowest  concentration of SARS-CoV-2 viral copies this assay can detect is 250  copies / mL. A negative result does not preclude SARS-CoV-2 infection  and should not be used as the sole basis for treatment or other  patient management decisions.  A negative result may occur with  improper specimen collection / handling, submission of specimen other  than nasopharyngeal swab, presence of viral mutation(s) within the  areas targeted by this assay, and inadequate number of viral copies  (<250 copies / mL). A negative result must be combined with clinical  observations, patient history, and epidemiological information. If result is POSITIVE SARS-CoV-2 target nucleic acids are DETECTED. The SARS-CoV-2 RNA is generally  detectable in upper and lower  respiratory specimens dur ing the acute phase of infection.  Positive  results are indicative of active infection with SARS-CoV-2.  Clinical  correlation with patient history and other diagnostic information is  necessary to determine patient infection status.  Positive results do  not rule out bacterial infection or co-infection with other viruses. If result is PRESUMPTIVE POSTIVE SARS-CoV-2 nucleic acids MAY BE PRESENT.   A presumptive positive result was obtained on the submitted specimen  and confirmed on repeat testing.  While 2019 novel coronavirus  (SARS-CoV-2) nucleic acids may be present in the submitted sample  additional confirmatory testing may be necessary for epidemiological  and / or clinical management purposes  to differentiate  between  SARS-CoV-2 and other Sarbecovirus currently known to infect humans.  If clinically indicated additional testing with an alternate test  methodology [phone]) is advised. The SARS-CoV-2 RNA is generally  detectable in upper and lower respiratory sp ecimens during the acute  phase of infection. The expected result is Negative. Fact Sheet for Patients:  StrictlyIdeas.no Fact Sheet for Healthcare Providers: BankingDealers.co.za This test is not yet approved or cleared by the [place] FDA and has been authorized for detection and/or diagnosis of SARS-CoV-2 by FDA under an Emergency Use Authorization (EUA).  This EUA will remain in effect (meaning this test can be used) for the duration of the COVID-19 declaration under Section 564(b)(1) of the Act, 21 U.S.C. section 360bbb-3(b)(1), unless the authorization is terminated or revoked sooner. Performed at [facility], [address], [place], [place] [address]   Blood Culture (routine x 2)     Status: None (Preliminary result)   Collection Time: 01/22/19  3:25 PM  Result Value Ref Range Status   Specimen Description BLOOD RIGHT ARM  Final   Special Requests   Final    BOTTLES DRAWN AEROBIC AND ANAEROBIC Blood Culture adequate volume Performed at [facility], [address] [place], [place] [address]    Culture PENDING  Incomplete   Report Status PENDING  Incomplete  Blood Culture (routine x 2)     Status: None (Preliminary result)   Collection Time: 01/22/19  3:26 PM  Result Value Ref Range Status   Specimen Description LEFT ANTECUBITAL  Final   Special Requests   Final    BOTTLES DRAWN AEROBIC AND ANAEROBIC Blood Culture adequate volume Performed at [facility], [address], [place], [place] [address]    Culture PENDING  Incomplete   Report Status PENDING  Incomplete     Radiological Exams on Admission: Ct Angio Chest Pe W And/or Wo Contrast  Result Date:  01/22/2019 CLINICAL DATA:  Shortness of breath EXAM: CT ANGIOGRAPHY CHEST WITH CONTRAST TECHNIQUE: Multidetector CT imaging of the chest was performed using the standard protocol during bolus administration of intravenous contrast. Multiplanar CT image reconstructions and MIPs were obtained to evaluate the vascular anatomy. CONTRAST:  59m OMNIPAQUE IOHEXOL 350 MG/ML SOLN COMPARISON:  Chest radiograph Jan 22, 2019 FINDINGS: Cardiovascular: There is no demonstrable pulmonary embolus. There is no appreciable thoracic aortic aneurysm or dissection. The visualized great vessels. Note that the right innominate and left common carotid arteries arise as a common trunk, an anatomic variant. Heart is mildly enlarged. There is no pericardial effusion or pericardial thickening. Mediastinum/Nodes: Thyroid appears normal. There are multiple subcentimeter axillary lymph nodes. There are occasional subcentimeter mediastinal lymph nodes, a few of which contain mild calcification. There is no frank adenopathy in the thoracic region by size criteria. No  esophageal lesions are evident. Lungs/Pleura: There is extensive airspace opacity with ground-glass type appearance throughout the right upper lobe as well as in both lower lobes, more on the right than on the left. There is no appreciable pleural effusion or pleural thickening. Upper Abdomen: Visualized upper abdominal structures appear normal. Musculoskeletal: There are no blastic or lytic bone lesions. There is no evident chest wall lesion. Review of the MIP images confirms the above findings. IMPRESSION: 1. No demonstrable pulmonary embolus. No thoracic aortic aneurysm or dissection. Mild cardiac enlargement. 2. Multifocal pneumonia, more severe on the right than on the left, with ground-glass type opacity throughout the regions of infiltrate. No appreciable volume loss or consolidation. Viral as well as bacterial pneumonitis can present in this manner; both types of organisms may  be present concurrently. 3. Multiple subcentimeter lymph nodes, particularly in the axillary regions without adenopathy by size criteria. Electronically Signed   By: Lowella Grip III M.D.   On: 01/22/2019 15:01   Dg Chest Port 1 View  Result Date: 01/22/2019 CLINICAL DATA:  Cough and shortness of breath for the past 2 weeks. EXAM: PORTABLE CHEST 1 VIEW COMPARISON:  Chest x-ray dated July 09, 2017. FINDINGS: Mild cardiomegaly. Normal mediastinal contours. Normal pulmonary vascularity. Hazy opacity in the right mid and lower lung. No pleural effusion or pneumothorax. No acute osseous abnormality. IMPRESSION: Hazy opacity in the right mid and lower lung, concerning for pneumonia. Electronically Signed   By: Titus Dubin M.D.   On: 01/22/2019 13:39    EKG: Independently reviewed.  Discussed tachycardia with low voltage.  No acute ST changes.  Assessment/Plan: Principal Problem:   Multifocal pneumonia Active Problems:   Type 2 diabetes mellitus without complication ([place])   Hypertension    This patient was discussed with the ED physician, including pertinent vitals, physical exam findings, labs, and imaging.  We also discussed care given by the ED provider.  1. Multifocal pneumonia a. Observation due to rapid progression and mild hypoxia b. Despite the patient's COVID-19 test being negative, I am concerned that this is a false negative.  Will repeat this to feed COVID-19 test at [facility].  Will transport the patient to [facility] long for cohorting suspected individuals. Check CRP, ESR Respiratory virus panel Antibiotics: Rocephin and azithromycin Robitussin Blood cultures drawn in the emergency department Sputum cultures CBC, d-dimer, CRP, ESR tomorrow Strep antigen by urine Influenza screen 2. Type 2 diabetes a. Hold metformin secondary to IV contrast b. Sliding scale insulin 3. Hypertension a. Continue home medication  DVT prophylaxis: Lovenox Consultants:  None Code Status: Full code Family Communication: None Disposition Plan: She should be able to return home following admission at this point.   Truett Mainland, DO

## 2019-01-22 NOTE — ED Notes (Signed)
[facility] arrived to transport patient.  

## 2019-01-22 NOTE — ED Triage Notes (Signed)
C/o SOB this am.  SOB is better now, however was started on new medication 4/23 Amlodipine by PCP.  Denies any or issues or pain.

## 2019-01-22 NOTE — ED Provider Notes (Signed)
[facility] EMERGENCY DEPARTMENT Provider Note   CSN: [id number] Arrival date & time: 01/22/19  1225    History   Chief Complaint Chief Complaint  Patient presents with  . Shortness of Breath    HPI Stacey Richardson is a 53 y.o. female.     HPI  The patient is a pleasant 53 year old female, she has a known history of diabetes and hypertension currently taking metformin, baby aspirin and amlodipine.  She presents to the hospital with shortness of breath today.  She reports this initially occurred a couple of weeks ago but she brushed it off and did not seek any help, and improved spontaneously and she has done well for the last couple of weeks however today she has noticed that she is more short of breath when she lays down, she has had some intermittent cough with some phlegm and presents very tearful, tachypneic and states that she is worried that her lungs are filling up with fluid though she cannot give me a reason why.  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  She denies fevers, chills, nausea, vomiting, diarrhea and has had a normal appetite.  She denies any swelling of her legs and up until this morning she had been doing fine.  She has no exertional symptoms, no history of pulmonary embolism or risk factors for pulmonary embolism either.  She reports her blood sugars have been ranging less than 200 for the last couple of weeks and she is taking her metformin as prescribed.  Past Medical History:  Diagnosis Date  . Diabetes mellitus   . Hypertension     There are no active problems to display for this patient.   Past Surgical History:  Procedure Laterality Date  . KNEE SURGERY       OB History   No obstetric history on file.      Home Medications    Prior to Admission medications   Medication Sig Start Date End Date Taking? Authorizing Provider  ketorolac (TORADOL) 10 MG tablet Take 1 tablet (10 mg total) by mouth every 6 (six) hours as needed. 06/21/15   Victorino Dike, FNP    Family History History reviewed. No pertinent family history.  Social History Social History   Tobacco Use  . Smoking status: Former Research scientist (life sciences)  . Smokeless tobacco: Never Used  Substance Use Topics  . Alcohol use: Yes  . Drug use: No     Allergies   Patient has no known allergies.   Review of Systems Review of Systems  All other systems reviewed and are negative.    Physical Exam Updated Vital Signs BP (!) 120/97 (BP Location: Left Arm)   Pulse (!) 102   Temp 98.4 F (36.9 C) (Oral)   Resp 18   Ht 1.651 m (5\' 5" )   Wt 89.8 kg   LMP 12/30/2018 (Approximate)   SpO2 94%   BMI 32.95 kg/m   Physical Exam Vitals signs and nursing note reviewed.  Constitutional:      General: She is not in acute distress.    Appearance: She is well-developed.     Comments: Anxious appearing and intermittently tearful  HENT:     Head: Normocephalic and atraumatic.     Mouth/Throat:  Pharynx: No oropharyngeal exudate.  Eyes:     General: No scleral icterus.       Right eye: No discharge.        Left eye: No discharge.     Conjunctiva/sclera: Conjunctivae normal.     Pupils: Pupils are equal, round, and reactive to light.  Neck:     Musculoskeletal: Normal range of motion and neck supple.     Thyroid: No thyromegaly.     Vascular: No JVD.  Cardiovascular:     Rate and Rhythm: Regular rhythm. Tachycardia present.     Heart sounds: Normal heart sounds. No murmur. No friction rub. No gallop.      Comments: Heart rate of 110, ranges between 101 115, sinus rhythm, occasional PVCs on monitor Pulmonary:     Effort: Pulmonary effort is normal. No respiratory distress.     Breath sounds: Normal breath sounds. No stridor. No  wheezing, rhonchi or rales.  Abdominal:     General: Bowel sounds are normal. There is no distension.     Palpations: Abdomen is soft. There is no mass.     Tenderness: There is no abdominal tenderness.  Musculoskeletal: Normal range of motion.        General: No tenderness.  Lymphadenopathy:     Cervical: No cervical adenopathy.  Skin:    General: Skin is warm and dry.     Findings: No erythema or rash.  Neurological:     Mental Status: She is alert.     Coordination: Coordination normal.  Psychiatric:        Behavior: Behavior normal.      ED Treatments / Results  Labs (all labs ordered are listed, but only abnormal results are displayed) Labs Reviewed  SARS CORONAVIRUS 2 ([facility])  CBC WITH DIFFERENTIAL/PLATELET  D-DIMER, QUANTITATIVE (NOT AT [facility])  TROPONIN I  COMPREHENSIVE METABOLIC PANEL    EKG None  Radiology No results found.  Procedures .Critical Care Performed by: Noemi Chapel, MD Authorized by: Noemi Chapel, MD   Critical care provider statement:    Critical care time (minutes):  35   Critical care time was exclusive of:  Separately billable procedures and treating other patients and teaching time   Critical care was necessary to treat or prevent imminent or life-threatening deterioration of the following conditions:  Sepsis and respiratory failure   Critical care was time spent personally by me on the following activities:  Blood draw for specimens, development of treatment plan with patient or surrogate, discussions with consultants, evaluation of patient's response to treatment, examination of patient, obtaining history from patient or surrogate, ordering and performing treatments and interventions, ordering and review of laboratory studies, ordering and review of radiographic studies, pulse oximetry, re-evaluation of patient's condition and review of old charts   (including critical care time)   Medications Ordered in ED Medications - No data to display   Initial Impression / Assessment and Plan / ED Course  I have reviewed the triage vital signs and the nursing notes.  Pertinent labs & imaging results that were available during my care of the patient were reviewed by me and considered in my medical decision making (see chart for details).  Clinical Course as of Jan 21 1525  Fri Jan 22, 2019  1451 I have personally looked at the CT scan, there is some asymmetric hazy opacities throughout both lungs right greater than left, this appears to be groundglass in appearance.   [BM]  Clinical Course User Index [BM] Noemi Chapel, MD       There is no JVD, no peripheral edema, no rales and no wheezing.  She does appear to be tachycardic, she is subjectively dyspneic and has an oxygen ranging between 92 and 94%.  Given the tachycardia with a slight hypoxia she will need an x-ray and possibly a PE work-up.  Also given that she works in a healthcare facility she may need testing for coronavirus.  The patient is agreeable to this plan.  She is not hypotensive and there is no signs of acute ischemia on her EKG.  The patient has had persistent hypoxia ranging between 86 and 94%, because of tachycardia and hypoxia and tachypnea I have opted to activate a code sepsis.  There is no leukocytosis, coronavirus is negative however her CT scan looks fairly consistent with this with groundglass opacities diffusely through both lungs but right greater than left.  This could be nothing more than a pneumonia however without leukocytosis or fever it is unclear whether this is going to be viral or bacterial.  I discussed the care with the hospitalist, Dr. Nehemiah Settle who will assist with admission and ongoing treatment of this patient.  She definitely meets criteria for sepsis though she is not in shock.    Final Clinical Impressions(s) / ED Diagnoses   Final diagnoses:  Sepsis, due to unspecified  organism, unspecified whether acute organ dysfunction present [facility])  Community acquired pneumonia, unspecified laterality      Noemi Chapel, MD 01/22/19 1528

## 2019-01-23 LAB — CBC
HCT: 34.8 % — ABNORMAL LOW (ref 36.0–46.0)
Hemoglobin: 9.2 g/dL — ABNORMAL LOW (ref 12.0–15.0)
MCH: 20.4 pg — ABNORMAL LOW (ref 26.0–34.0)
MCHC: 26.4 g/dL — ABNORMAL LOW (ref 30.0–36.0)
MCV: 77 fL — ABNORMAL LOW (ref 80.0–100.0)
Platelets: 485 10*3/uL — ABNORMAL HIGH (ref 150–400)
RBC: 4.52 MIL/uL (ref 3.87–5.11)
RDW: 25.9 % — ABNORMAL HIGH (ref 11.5–15.5)
WBC: 8.6 10*3/uL (ref 4.0–10.5)
nRBC: 0 % (ref 0.0–0.2)

## 2019-01-23 LAB — BASIC METABOLIC PANEL
Anion gap: 10 (ref 5–15)
BUN: 9 mg/dL (ref 6–20)
CO2: 22 mmol/L (ref 22–32)
Calcium: 9 mg/dL (ref 8.9–10.3)
Chloride: 107 mmol/L (ref 98–111)
Creatinine, Ser: 0.65 mg/dL (ref 0.44–1.00)
GFR calc Af Amer: >60 mL/min (ref >60)
GFR calc non Af Amer: >60 mL/min (ref >60)
Glucose, Bld: 114 mg/dL — ABNORMAL HIGH (ref 70–99)
Potassium: 3.7 mmol/L (ref 3.5–5.1)
Sodium: 139 mmol/L (ref 135–145)

## 2019-01-23 LAB — GLUCOSE, CAPILLARY
Glucose-Capillary: 116 mg/dL — ABNORMAL HIGH (ref 70–99)
Glucose-Capillary: 142 mg/dL — ABNORMAL HIGH (ref 70–99)
Glucose-Capillary: 104 mg/dL — ABNORMAL HIGH (ref 70–99)
Glucose-Capillary: 114 mg/dL — ABNORMAL HIGH (ref 70–99)

## 2019-01-23 LAB — C-REACTIVE PROTEIN: CRP: <0.8 mg/dL (ref <1.0)

## 2019-01-23 LAB — INFLUENZA PANEL BY PCR (TYPE A & B)
Influenza A By PCR: NEGATIVE
Influenza B By PCR: NEGATIVE

## 2019-01-23 LAB — SEDIMENTATION RATE: Sed Rate: 16 mm/hr (ref 0–22)

## 2019-01-23 LAB — PROCALCITONIN: Procalcitonin: <0.1 ng/mL

## 2019-01-23 LAB — D-DIMER, QUANTITATIVE: D-Dimer, Quant: 1.06 ug/mL-FEU — ABNORMAL HIGH (ref 0.00–0.50)

## 2019-01-23 LAB — HIV ANTIBODY (ROUTINE TESTING W REFLEX): HIV Screen 4th Generation wRfx: NONREACTIVE

## 2019-01-23 NOTE — Progress Notes (Signed)
PROGRESS NOTE    Stacey Richardson  PRF:[id number] DOB: [birth year] DOA: 01/22/2019 PCP: Theotis Burrow, MD   Brief Narrative:  Stacey Richardson is a 53 year old African-American female with a past medical history of type 2 diabetes mellitus and hypertension who presented to [facility] emergency department with mild shortness of breath and cough for past couple of days.  She works as Quarry manager at a nursing home but denied any close contact with COVID-19 positive patients.  Cough is mildly productive.  No fever, nausea, vomiting diarrhea or any other complaint.  She was tested negative for COVID-19 however chest x-ray showed bilateral groundglass opacity and pneumonia this was confirmed on CT chest as well.  Despite of negative COVID-19, due to having high suspicion, she was transferred to [facility] under hospitalist service for further evaluation.  He was started on IV Rocephin and Zithromax.  Influenza and respiratory viral panel were ordered but they are not done for some reason.  Consultants:   None  Procedures:   None  Antimicrobials:   IV Rocephin and Zithromax started on 01/22/2019   Subjective: Patient seen and examined.  She states that she feels much better today.  Denies any shortness of breath or any fever.  Objective: Vitals:   01/22/19 1242 01/22/19 1815 01/22/19 2049 01/23/19 1100  BP:  (!) 142/62 135/79 120/65  Pulse:  (!) 111 98 100  Resp:  (!) 21 20 18   Temp:  99.3 F (37.4 C) 98.6 F (37 C) 98.7 F (37.1 C)  TempSrc:  Oral Oral Oral  SpO2:  97% 98% 100%  Weight: 89.8 kg     Height: 5\' 5"  (1.651 m)       Intake/Output Summary (Last 24 hours) at 01/23/2019 1459 Last data filed at 01/23/2019 0900 Gross per 24 hour  Intake 460 ml  Output --  Net 460 ml   Filed Weights   01/22/19 1242  Weight: 89.8 kg    Examination:  General exam: Appears calm and comfortable  Respiratory system: Clear to auscultation. Respiratory effort  normal. Cardiovascular system: S1 & S2 heard, RRR. No JVD, murmurs, rubs, gallops or clicks. No pedal edema. Gastrointestinal system: Abdomen is nondistended, soft and nontender. No organomegaly or masses felt. Normal bowel sounds heard. Central nervous system: Alert and oriented. No focal neurological deficits. Extremities: Symmetric 5 x 5 power. Skin: No rashes, lesions or ulcers Psychiatry: Judgement and insight appear normal. Mood & affect appropriate.    Data Reviewed: I have personally reviewed following labs and imaging studies  CBC: Recent Labs  Lab 01/22/19 1302 01/23/19 0601  WBC 7.3 8.6  NEUTROABS 5.0  --   HGB 9.5* 9.2*  HCT 34.9* 34.8*  MCV 75.4* 77.0*  PLT 465* 935*   Basic Metabolic Panel: Recent Labs  Lab 01/22/19 1302 01/23/19 0601  NA 137 139  K 3.7 3.7  CL 106 107  CO2 20* 22  GLUCOSE 100* 114*  BUN 8 9  CREATININE 0.54 0.65  CALCIUM 9.1 9.0   GFR: Estimated Creatinine Clearance: 92.1 mL/min (by C-G formula based on SCr of 0.65 mg/dL). Liver Function Tests: Recent Labs  Lab 01/22/19 1302  AST 14*  ALT 14  ALKPHOS 52  BILITOT 0.3  PROT 7.6  ALBUMIN 4.1   No results for input(s): LIPASE, AMYLASE in the last 168 hours. No results for input(s): AMMONIA in the last 168 hours. Coagulation Profile: No results for input(s): INR, PROTIME in the last 168 hours. Cardiac Enzymes: Recent  Labs  Lab 01/22/19 1302  TROPONINI <0.03   BNP (last 3 results) No results for input(s): PROBNP in the last 8760 hours. HbA1C: No results for input(s): HGBA1C in the last 72 hours. CBG: Recent Labs  Lab 01/22/19 2044 01/23/19 0759 01/23/19 1158  GLUCAP 136* 116* 142*   Lipid Profile: No results for input(s): CHOL, HDL, LDLCALC, TRIG, CHOLHDL, LDLDIRECT in the last 72 hours. Thyroid Function Tests: No results for input(s): TSH, T4TOTAL, FREET4, T3FREE, THYROIDAB in the last 72 hours. Anemia Panel: No results for input(s): VITAMINB12, FOLATE, FERRITIN,  TIBC, IRON, RETICCTPCT in the last 72 hours. Sepsis Labs: Recent Labs  Lab 01/22/19 1521 01/22/19 1814 01/23/19 0601  PROCALCITON  --  <0.10 <0.10  LATICACIDVEN 1.2  --   --     Recent Results (from the past 240 hour(s))  SARS Coronavirus 2 (CEPHEID- Performed in [place] hospital lab), Hosp Order     Status: None   Collection Time: 01/22/19  1:03 PM  Result Value Ref Range Status   SARS Coronavirus 2 NEGATIVE NEGATIVE Final    Comment: (NOTE) If result is NEGATIVE SARS-CoV-2 target nucleic acids are NOT DETECTED. The SARS-CoV-2 RNA is generally detectable in upper and lower  respiratory specimens during the acute phase of infection. The lowest  concentration of SARS-CoV-2 viral copies this assay can detect is 250  copies / mL. A negative result does not preclude SARS-CoV-2 infection  and should not be used as the sole basis for treatment or other  patient management decisions.  A negative result may occur with  improper specimen collection / handling, submission of specimen other  than nasopharyngeal swab, presence of viral mutation(s) within the  areas targeted by this assay, and inadequate number of viral copies  (<250 copies / mL). A negative result must be combined with clinical  observations, patient history, and epidemiological information. If result is POSITIVE SARS-CoV-2 target nucleic acids are DETECTED. The SARS-CoV-2 RNA is generally detectable in upper and lower  respiratory specimens dur ing the acute phase of infection.  Positive  results are indicative of active infection with SARS-CoV-2.  Clinical  correlation with patient history and other diagnostic information is  necessary to determine patient infection status.  Positive results do  not rule out bacterial infection or co-infection with other viruses. If result is PRESUMPTIVE POSTIVE SARS-CoV-2 nucleic acids MAY BE PRESENT.   A presumptive positive result was obtained on the submitted specimen  and  confirmed on repeat testing.  While 2019 novel coronavirus  (SARS-CoV-2) nucleic acids may be present in the submitted sample  additional confirmatory testing may be necessary for epidemiological  and / or clinical management purposes  to differentiate between  SARS-CoV-2 and other Sarbecovirus currently known to infect humans.  If clinically indicated additional testing with an alternate test  methodology [phone]) is advised. The SARS-CoV-2 RNA is generally  detectable in upper and lower respiratory sp ecimens during the acute  phase of infection. The expected result is Negative. Fact Sheet for Patients:  StrictlyIdeas.no Fact Sheet for Healthcare Providers: BankingDealers.co.za This test is not yet approved or cleared by the [place] FDA and has been authorized for detection and/or diagnosis of SARS-CoV-2 by FDA under an Emergency Use Authorization (EUA).  This EUA will remain in effect (meaning this test can be used) for the duration of the COVID-19 declaration under Section 564(b)(1) of the Act, 21 U.S.C. section 360bbb-3(b)(1), unless the authorization is terminated or revoked sooner. Performed at [facility],  [address], [place], [place] [address]   Blood Culture (routine x 2)     Status: None (Preliminary result)   Collection Time: 01/22/19  3:25 PM  Result Value Ref Range Status   Specimen Description BLOOD RIGHT ARM  Final   Special Requests   Final    BOTTLES DRAWN AEROBIC AND ANAEROBIC Blood Culture adequate volume   Culture   Final    NO GROWTH < 24 HOURS Performed at [facility], [address] [place], [place] [address]    Report Status PENDING  Incomplete  Blood Culture (routine x 2)     Status: None (Preliminary result)   Collection Time: 01/22/19  3:26 PM  Result Value Ref Range Status   Specimen Description LEFT ANTECUBITAL  Final   Special Requests   Final    BOTTLES DRAWN AEROBIC AND ANAEROBIC Blood  Culture adequate volume   Culture   Final    NO GROWTH < 24 HOURS Performed at [facility], [address], [place], [place] [address]    Report Status PENDING  Incomplete      Radiology Studies: Ct Angio Chest Pe W And/or Wo Contrast  Result Date: 01/22/2019 CLINICAL DATA:  Shortness of breath EXAM: CT ANGIOGRAPHY CHEST WITH CONTRAST TECHNIQUE: Multidetector CT imaging of the chest was performed using the standard protocol during bolus administration of intravenous contrast. Multiplanar CT image reconstructions and MIPs were obtained to evaluate the vascular anatomy. CONTRAST:  40mL OMNIPAQUE IOHEXOL 350 MG/ML SOLN COMPARISON:  Chest radiograph Jan 22, 2019 FINDINGS: Cardiovascular: There is no demonstrable pulmonary embolus. There is no appreciable thoracic aortic aneurysm or dissection. The visualized great vessels. Note that the right innominate and left common carotid arteries arise as a common trunk, an anatomic variant. Heart is mildly enlarged. There is no pericardial effusion or pericardial thickening. Mediastinum/Nodes: Thyroid appears normal. There are multiple subcentimeter axillary lymph nodes. There are occasional subcentimeter mediastinal lymph nodes, a few of which contain mild calcification. There is no frank adenopathy in the thoracic region by size criteria. No esophageal lesions are evident. Lungs/Pleura: There is extensive airspace opacity with ground-glass type appearance throughout the right upper lobe as well as in both lower lobes, more on the right than on the left. There is no appreciable pleural effusion or pleural thickening. Upper Abdomen: Visualized upper abdominal structures appear normal. Musculoskeletal: There are no blastic or lytic bone lesions. There is no evident chest wall lesion. Review of the MIP images confirms the above findings. IMPRESSION: 1. No demonstrable pulmonary embolus. No thoracic aortic aneurysm or dissection. Mild cardiac enlargement. 2. Multifocal  pneumonia, more severe on the right than on the left, with ground-glass type opacity throughout the regions of infiltrate. No appreciable volume loss or consolidation. Viral as well as bacterial pneumonitis can present in this manner; both types of organisms may be present concurrently. 3. Multiple subcentimeter lymph nodes, particularly in the axillary regions without adenopathy by size criteria. Electronically Signed   By: Lowella Grip III M.D.   On: 01/22/2019 15:01   Dg Chest Port 1 View  Result Date: 01/22/2019 CLINICAL DATA:  Cough and shortness of breath for the past 2 weeks. EXAM: PORTABLE CHEST 1 VIEW COMPARISON:  Chest x-ray dated July 09, 2017. FINDINGS: Mild cardiomegaly. Normal mediastinal contours. Normal pulmonary vascularity. Hazy opacity in the right mid and lower lung. No pleural effusion or pneumothorax. No acute osseous abnormality. IMPRESSION: Hazy opacity in the right mid and lower lung, concerning for pneumonia. Electronically Signed  By: Titus Dubin M.D.   On: 01/22/2019 13:39    Scheduled Meds:  enoxaparin (LOVENOX) injection  40 mg Subcutaneous Q24H   insulin aspart  0-15 Units Subcutaneous TID WC   insulin aspart  0-5 Units Subcutaneous QHS   Continuous Infusions:  azithromycin     cefTRIAXone (ROCEPHIN)  IV 1 g (01/23/19 1451)     LOS: 0 days   Assessment & Plan:   Principal Problem:   Multifocal pneumonia Active Problems:   Type 2 diabetes mellitus without complication (HCC)   Hypertension  Multifocal community-acquired pneumonia: Patient is not hypoxic.  Afebrile.  No leukocytosis.  Tested negative for COVID-19 x2 so far.  We will continue IV Rocephin and Zithromax and follow cultures and continue antibiotics.  Will check for respiratory viral panel and influenza.  Type 2 diabetes mellitus: Blood sugar stable.  Continue sliding scale screen.  Continue to hold metformin.  Hypertension: Controlled.  Continue current medications.  DVT  prophylaxis: Lovenox Code Status: Full code Family Communication: Discussed with patient Disposition Plan: Potential discharge in next 1 to 2 days  Time spent: 29 minutes  Darliss Cheney, MD [facility] Pager [phone]  If 7PM-7AM, please contact night-coverage [email or web address] Password [facility] 01/23/2019, 2:59 PM

## 2019-01-24 ENCOUNTER — Inpatient Hospital Stay (HOSPITAL_COMMUNITY): Payer: Self-pay

## 2019-01-24 LAB — RESPIRATORY PANEL BY PCR
Adenovirus: NOT DETECTED
Adenovirus: NOT DETECTED
Bordetella pertussis: NOT DETECTED
Bordetella pertussis: NOT DETECTED
Chlamydophila pneumoniae: NOT DETECTED
Chlamydophila pneumoniae: NOT DETECTED
Coronavirus 229E: NOT DETECTED
Coronavirus 229E: NOT DETECTED
Coronavirus HKU1: NOT DETECTED
Coronavirus HKU1: NOT DETECTED
Coronavirus NL63: NOT DETECTED
Coronavirus NL63: NOT DETECTED
Coronavirus OC43: NOT DETECTED
Coronavirus OC43: NOT DETECTED
Influenza A: NOT DETECTED
Influenza A: NOT DETECTED
Influenza B: NOT DETECTED
Influenza B: NOT DETECTED
Metapneumovirus: NOT DETECTED
Metapneumovirus: NOT DETECTED
Mycoplasma pneumoniae: NOT DETECTED
Mycoplasma pneumoniae: NOT DETECTED
Parainfluenza Virus 1: NOT DETECTED
Parainfluenza Virus 1: NOT DETECTED
Parainfluenza Virus 2: NOT DETECTED
Parainfluenza Virus 2: NOT DETECTED
Parainfluenza Virus 3: NOT DETECTED
Parainfluenza Virus 3: NOT DETECTED
Parainfluenza Virus 4: NOT DETECTED
Parainfluenza Virus 4: NOT DETECTED
Respiratory Syncytial Virus: NOT DETECTED
Respiratory Syncytial Virus: NOT DETECTED
Rhinovirus / Enterovirus: NOT DETECTED
Rhinovirus / Enterovirus: NOT DETECTED

## 2019-01-24 LAB — COMPREHENSIVE METABOLIC PANEL
ALT: 13 U/L (ref 0–44)
AST: 15 U/L (ref 15–41)
Albumin: 4 g/dL (ref 3.5–5.0)
Alkaline Phosphatase: 51 U/L (ref 38–126)
Anion gap: 7 (ref 5–15)
BUN: 9 mg/dL (ref 6–20)
CO2: 25 mmol/L (ref 22–32)
Calcium: 8.8 mg/dL — ABNORMAL LOW (ref 8.9–10.3)
Chloride: 106 mmol/L (ref 98–111)
Creatinine, Ser: 0.57 mg/dL (ref 0.44–1.00)
GFR calc Af Amer: >60 mL/min (ref >60)
GFR calc non Af Amer: >60 mL/min (ref >60)
Glucose, Bld: 147 mg/dL — ABNORMAL HIGH (ref 70–99)
Potassium: 3.8 mmol/L (ref 3.5–5.1)
Sodium: 138 mmol/L (ref 135–145)
Total Bilirubin: 0.6 mg/dL (ref 0.3–1.2)
Total Protein: 7.5 g/dL (ref 6.5–8.1)

## 2019-01-24 LAB — CBC WITH DIFFERENTIAL/PLATELET
Abs Immature Granulocytes: 0.06 10*3/uL (ref 0.00–0.07)
Basophils Absolute: 0.1 10*3/uL (ref 0.0–0.1)
Basophils Relative: 1 %
Eosinophils Absolute: 0.4 10*3/uL (ref 0.0–0.5)
Eosinophils Relative: 6 %
HCT: 34.2 % — ABNORMAL LOW (ref 36.0–46.0)
Hemoglobin: 9.3 g/dL — ABNORMAL LOW (ref 12.0–15.0)
Immature Granulocytes: 1 %
Lymphocytes Relative: 26 %
Lymphs Abs: 1.6 10*3/uL (ref 0.7–4.0)
MCH: 20.8 pg — ABNORMAL LOW (ref 26.0–34.0)
MCHC: 27.2 g/dL — ABNORMAL LOW (ref 30.0–36.0)
MCV: 76.5 fL — ABNORMAL LOW (ref 80.0–100.0)
Monocytes Absolute: 0.5 10*3/uL (ref 0.1–1.0)
Monocytes Relative: 8 %
Neutro Abs: 3.6 10*3/uL (ref 1.7–7.7)
Neutrophils Relative %: 58 %
Platelets: 425 10*3/uL — ABNORMAL HIGH (ref 150–400)
RBC: 4.47 MIL/uL (ref 3.87–5.11)
RDW: 26.1 % — ABNORMAL HIGH (ref 11.5–15.5)
WBC: 6.2 10*3/uL (ref 4.0–10.5)
nRBC: 0 % (ref 0.0–0.2)

## 2019-01-24 LAB — PROCALCITONIN: Procalcitonin: <0.1 ng/mL

## 2019-01-24 LAB — GLUCOSE, CAPILLARY
Glucose-Capillary: 96 mg/dL (ref 70–99)
Glucose-Capillary: 130 mg/dL — ABNORMAL HIGH (ref 70–99)

## 2019-01-24 LAB — MAGNESIUM: Magnesium: 1.9 mg/dL (ref 1.7–2.4)

## 2019-01-24 MED ORDER — CEFDINIR 300 MG PO CAPS: 300.0000 mg | ORAL_CAPSULE | Freq: Two times a day (BID) | ORAL | 0 refills | Status: AC

## 2019-01-24 MED ORDER — AZITHROMYCIN 250 MG PO TABS
500.0000 mg | ORAL_TABLET | Freq: Every day | ORAL | Status: DC
  Administered 2019-01-24: 500 mg via ORAL
  Filled 2019-01-24: qty 2

## 2019-01-24 NOTE — Discharge Instructions (Signed)
Community-Acquired Pneumonia, Adult  Pneumonia is an infection of the lungs. It causes swelling in the airways of the lungs. Mucus and fluid may also build up inside the airways.  One type of pneumonia can happen while a person is in a hospital. A different type can happen when a person is not in a hospital (community-acquired pneumonia).   What are the causes?    This condition is caused by germs (viruses, bacteria, or fungi). Some types of germs can be passed from one person to another. This can happen when you breathe in droplets from the cough or sneeze of an infected person.  What increases the risk?  You are more likely to develop this condition if you:   Have a long-term (chronic) disease, such as:  ? Chronic obstructive pulmonary disease (COPD).  ? Asthma.  ? Cystic fibrosis.  ? Congestive heart failure.  ? Diabetes.  ? Kidney disease.   Have HIV.   Have sickle cell disease.   Have had your spleen removed.   Do not take good care of your teeth and mouth (poor dental hygiene).   Have a medical condition that increases the risk of breathing in droplets from your own mouth and nose.   Have a weakened body defense system (immune system).   Are a smoker.   Travel to areas where the germs that cause this illness are common.   Are around certain animals or the places they live.  What are the signs or symptoms?   A dry cough.   A wet (productive) cough.   Fever.   Sweating.   Chest pain. This often happens when breathing deeply or coughing.   Fast breathing or trouble breathing.   Shortness of breath.   Shaking chills.   Feeling tired (fatigue).   Muscle aches.  How is this treated?  Treatment for this condition depends on many things. Most adults can be treated at home. In some cases, treatment must happen in a hospital. Treatment may include:   Medicines given by mouth or through an IV tube.   Being given extra oxygen.   Respiratory therapy.  In rare cases, treatment for very bad pneumonia  may include:   Using a machine to help you breathe.   Having a procedure to remove fluid from around your lungs.  Follow these instructions at home:  Medicines   Take over-the-counter and prescription medicines only as told by your doctor.  ? Only take cough medicine if you are losing sleep.   If you were prescribed an antibiotic medicine, take it as told by your doctor. Do not stop taking the antibiotic even if you start to feel better.  General instructions     Sleep with your head and neck raised (elevated). You can do this by sleeping in a recliner or by putting a few pillows under your head.   Rest as needed. Get at least 8 hours of sleep each night.   Drink enough water to keep your pee (urine) pale yellow.   Eat a healthy diet that includes plenty of vegetables, fruits, whole grains, low-fat dairy products, and lean protein.   Do not use any products that contain nicotine or tobacco. These include cigarettes, e-cigarettes, and chewing tobacco. If you need help quitting, ask your doctor.   Keep all follow-up visits as told by your doctor. This is important.  How is this prevented?  A shot (vaccine) can help prevent pneumonia. Shots are often suggested for:   People   older than 51 years of age.   People older than 51 years of age who:  ? Are having cancer treatment.  ? Have long-term (chronic) lung disease.  ? Have problems with their body's defense system.  You may also prevent pneumonia if you take these actions:   Get the flu (influenza) shot every year.   Go to the dentist as often as told.   Wash your hands often. If you cannot use soap and water, use hand sanitizer.  Contact a doctor if:   You have a fever.   You lose sleep because your cough medicine does not help.  Get help right away if:   You are short of breath and it gets worse.   You have more chest pain.   Your sickness gets worse. This is very serious if:  ? You are an older adult.  ? Your body's defense system is weak.   You  cough up blood.  Summary   Pneumonia is an infection of the lungs.   Most adults can be treated at home. Some will need treatment in a hospital.   Drink enough water to keep your pee pale yellow.   Get at least 8 hours of sleep each night.  This information is not intended to replace advice given to you by your health care provider. Make sure you discuss any questions you have with your health care provider.  Document Released: 02/12/2008 Document Revised: 04/23/2018 Document Reviewed: 04/23/2018  Elsevier Interactive Patient Education  2019 [employer]vier Inc[employer]

## 2019-01-24 NOTE — Plan of Care (Signed)
Discharge instructions reviewed with patient, questions answered, verbalized understanding.  Patient transported to main entrance to be taken home by significant other.

## 2019-01-24 NOTE — Progress Notes (Signed)
PHARMACIST - PHYSICIAN COMMUNICATION DR:  Doristine Bosworth CONCERNING: Antibiotic IV to Oral Route Change Policy  RECOMMENDATION: This patient is receiving Zithromax by the intravenous route.  Based on criteria approved by the Pharmacy and Therapeutics Committee, the antibiotic(s) is/are being converted to the equivalent oral dose form(s).   DESCRIPTION: These criteria include:  Patient being treated for a respiratory tract infection, urinary tract infection, cellulitis or clostridium difficile associated diarrhea if on metronidazole  The patient is not neutropenic and does not exhibit a GI malabsorption state  The patient is eating (either orally or via tube) and/or has been taking other orally administered medications for a least 24 hours  The patient is improving clinically and has a Tmax < 100.5  If you have questions about this conversion, please contact the Pharmacy Department  []   [phone] )  [facility] []   [phone] )  [facility] []   [phone] )  [facility] []   [phone] )  [facility] [x]   [phone] )  [place], PharmD, BCPS 01/24/2019@7 :36 AM

## 2019-01-24 NOTE — Plan of Care (Signed)
Pt had a good appetite this shift and denied pain.

## 2019-01-24 NOTE — Discharge Summary (Signed)
Physician Discharge Summary  [name] ERX:[id number] DOB: 04-27-1966 DOA: 01/22/2019  PCP: Theotis Burrow, MD  Admit date: 01/22/2019 Discharge date: 01/24/2019  Admitted From: Home Disposition: Home  Recommendations for Outpatient Follow-up:  1. Follow up with PCP in 1-2 weeks 2. Please obtain BMP/CBC in one week 3. Please follow up on the following pending results:  Home Health: None Equipment/Devices: None  Discharge Condition: Stable CODE STATUS: Full code Diet recommendation: Cardiac diet  Subjective: Patient seen and examined.  She has no complaints.  Denies any shortness of breath.  Brief/Interim Summary: Stacey Richardson is a 53 year old African-American female with a past medical history of type 2 diabetes mellitus and hypertension who presented to [facility] emergency department with mild shortness of breath and cough for past couple of days.  She works as Quarry manager at a nursing home but denied any close contact with COVID-19 positive patients.  Cough was mildly productive.  No fever, nausea, vomiting diarrhea or any other complaint.  She was tested negative for COVID-19 however chest x-ray showed bilateral groundglass opacity and pneumonia and this was confirmed on CT chest as well.  Despite of negative COVID-19, due to having high suspicion, she was transferred to [facility] long hospital under hospitalist service for further evaluation.    She was started on IV Rocephin and Zithromax.  Influenza and respiratory viral panel were negative as well.  She was tested again for COVID-19 and was negative again.  She did not require any oxygen.  She remained hemodynamically stable and afebrile.  Repeat chest x-ray today shows significant improvement and she has no symptoms so she will be discharged today.  About to prescribe her 7 days of cefdinir and she will follow with PCP within 7 days.  Discharge Diagnoses:  Principal Problem:   Multifocal pneumonia Active Problems:   Type 2  diabetes mellitus without complication [facility])   Hypertension    Discharge Instructions  Discharge Instructions    Discharge patient   Complete by:  As directed    Discharge disposition:  01-Home or Self Care   Discharge patient date:  01/24/2019     Allergies as of 01/24/2019   No Known Allergies     Medication List    TAKE these medications   amLODipine 10 MG tablet Commonly known as:  NORVASC Take 10 mg by mouth daily.   aspirin EC 81 MG tablet Take 81 mg by mouth daily.   cefdinir 300 MG capsule Commonly known as:  OMNICEF Take 1 capsule (300 mg total) by mouth 2 (two) times daily for 7 days.   ferrous sulfate 325 (65 FE) MG tablet Take 325 mg by mouth 3 (three) times daily with meals.   ketorolac 10 MG tablet Commonly known as:  TORADOL Take 1 tablet (10 mg total) by mouth every 6 (six) hours as needed.   metFORMIN 500 MG tablet Commonly known as:  GLUCOPHAGE Take 1,000 mg by mouth 2 (two) times daily with a meal.   multivitamin tablet Take 1 tablet by mouth daily.      Follow-up Information    [name], [name], MD Follow up in 1 week(s).   Specialty:  Family Medicine Contact information: [place] [address] [phone]          No Known Allergies  Consultations: None   Procedures/Studies: Ct Angio Chest Pe W And/or Wo Contrast  Result Date: 01/22/2019 CLINICAL DATA:  Shortness of breath EXAM: CT ANGIOGRAPHY CHEST WITH  CONTRAST TECHNIQUE: Multidetector CT imaging of the chest was performed using the standard protocol during bolus administration of intravenous contrast. Multiplanar CT image reconstructions and MIPs were obtained to evaluate the vascular anatomy. CONTRAST:  53mL OMNIPAQUE IOHEXOL 350 MG/ML SOLN COMPARISON:  Chest radiograph Jan 22, 2019 FINDINGS: Cardiovascular: There is no demonstrable pulmonary embolus. There is no appreciable thoracic aortic aneurysm or dissection. The visualized great vessels.  Note that the right innominate and left common carotid arteries arise as a common trunk, an anatomic variant. Heart is mildly enlarged. There is no pericardial effusion or pericardial thickening. Mediastinum/Nodes: Thyroid appears normal. There are multiple subcentimeter axillary lymph nodes. There are occasional subcentimeter mediastinal lymph nodes, a few of which contain mild calcification. There is no frank adenopathy in the thoracic region by size criteria. No esophageal lesions are evident. Lungs/Pleura: There is extensive airspace opacity with ground-glass type appearance throughout the right upper lobe as well as in both lower lobes, more on the right than on the left. There is no appreciable pleural effusion or pleural thickening. Upper Abdomen: Visualized upper abdominal structures appear normal. Musculoskeletal: There are no blastic or lytic bone lesions. There is no evident chest wall lesion. Review of the MIP images confirms the above findings. IMPRESSION: 1. No demonstrable pulmonary embolus. No thoracic aortic aneurysm or dissection. Mild cardiac enlargement. 2. Multifocal pneumonia, more severe on the right than on the left, with ground-glass type opacity throughout the regions of infiltrate. No appreciable volume loss or consolidation. Viral as well as bacterial pneumonitis can present in this manner; both types of organisms may be present concurrently. 3. Multiple subcentimeter lymph nodes, particularly in the axillary regions without adenopathy by size criteria. Electronically Signed   By: Lowella Grip III M.D.   On: 01/22/2019 15:01   Dg Chest Port 1 View  Result Date: 01/22/2019 CLINICAL DATA:  Cough and shortness of breath for the past 2 weeks. EXAM: PORTABLE CHEST 1 VIEW COMPARISON:  Chest x-ray dated July 09, 2017. FINDINGS: Mild cardiomegaly. Normal mediastinal contours. Normal pulmonary vascularity. Hazy opacity in the right mid and lower lung. No pleural effusion or  pneumothorax. No acute osseous abnormality. IMPRESSION: Hazy opacity in the right mid and lower lung, concerning for pneumonia. Electronically Signed   By: Titus Dubin M.D.   On: 01/22/2019 13:39      Discharge Exam: Vitals:   01/23/19 2137 01/24/19 0449  BP: 136/77 112/75  Pulse: 97 84  Resp: 16 16  Temp: 98.5 F (36.9 C) 97.8 F (36.6 C)  SpO2: 100% 99%   Vitals:   01/22/19 2049 01/23/19 1100 01/23/19 2137 01/24/19 0449  BP: 135/79 120/65 136/77 112/75  Pulse: 98 100 97 84  Resp: 20 18 16 16   Temp: 98.6 F (37 C) 98.7 F (37.1 C) 98.5 F (36.9 C) 97.8 F (36.6 C)  TempSrc: Oral Oral Oral Oral  SpO2: 98% 100% 100% 99%  Weight:      Height:        General: Pt is alert, awake, not in acute distress Cardiovascular: RRR, S1/S2 +, no rubs, no gallops Respiratory: CTA bilaterally, no wheezing, no rhonchi Abdominal: Soft, NT, ND, bowel sounds + Extremities: no edema, no cyanosis    The results of significant diagnostics from this hospitalization (including imaging, microbiology, ancillary and laboratory) are listed below for reference.     Microbiology: Recent Results (from the past 240 hour(s))  SARS Coronavirus 2 (CEPHEID- Performed in [place] hospital lab), [facility]  Status: None   Collection Time: 01/22/19  1:03 PM  Result Value Ref Range Status   SARS Coronavirus 2 NEGATIVE NEGATIVE Final    Comment: (NOTE) If result is NEGATIVE SARS-CoV-2 target nucleic acids are NOT DETECTED. The SARS-CoV-2 RNA is generally detectable in upper and lower  respiratory specimens during the acute phase of infection. The lowest  concentration of SARS-CoV-2 viral copies this assay can detect is 250  copies / mL. A negative result does not preclude SARS-CoV-2 infection  and should not be used as the sole basis for treatment or other  patient management decisions.  A negative result may occur with  improper specimen collection / handling, submission of specimen other   than nasopharyngeal swab, presence of viral mutation(s) within the  areas targeted by this assay, and inadequate number of viral copies  (<250 copies / mL). A negative result must be combined with clinical  observations, patient history, and epidemiological information. If result is POSITIVE SARS-CoV-2 target nucleic acids are DETECTED. The SARS-CoV-2 RNA is generally detectable in upper and lower  respiratory specimens dur ing the acute phase of infection.  Positive  results are indicative of active infection with SARS-CoV-2.  Clinical  correlation with patient history and other diagnostic information is  necessary to determine patient infection status.  Positive results do  not rule out bacterial infection or co-infection with other viruses. If result is PRESUMPTIVE POSTIVE SARS-CoV-2 nucleic acids MAY BE PRESENT.   A presumptive positive result was obtained on the submitted specimen  and confirmed on repeat testing.  While 2019 novel coronavirus  (SARS-CoV-2) nucleic acids may be present in the submitted sample  additional confirmatory testing may be necessary for epidemiological  and / or clinical management purposes  to differentiate between  SARS-CoV-2 and other Sarbecovirus currently known to infect humans.  If clinically indicated additional testing with an alternate test  methodology [phone]) is advised. The SARS-CoV-2 RNA is generally  detectable in upper and lower respiratory sp ecimens during the acute  phase of infection. The expected result is Negative. Fact Sheet for Patients:  StrictlyIdeas.no Fact Sheet for Healthcare Providers: BankingDealers.co.za This test is not yet approved or cleared by the [place] FDA and has been authorized for detection and/or diagnosis of SARS-CoV-2 by FDA under an Emergency Use Authorization (EUA).  This EUA will remain in effect (meaning this test can be used) for the duration of  the COVID-19 declaration under Section 564(b)(1) of the Act, 21 U.S.C. section 360bbb-3(b)(1), unless the authorization is terminated or revoked sooner. Performed at [facility], [address] [place], [place] [address]   Blood Culture (routine x 2)     Status: None (Preliminary result)   Collection Time: 01/22/19  3:25 PM  Result Value Ref Range Status   Specimen Description BLOOD RIGHT ARM  Final   Special Requests   Final    BOTTLES DRAWN AEROBIC AND ANAEROBIC Blood Culture adequate volume   Culture   Final    NO GROWTH 2 DAYS Performed at [facility], [address], [place],  [address]    Report Status PENDING  Incomplete  Blood Culture (routine x 2)     Status: None (Preliminary result)   Collection Time: 01/22/19  3:26 PM  Result Value Ref Range Status   Specimen Description LEFT ANTECUBITAL  Final   Special Requests   Final    BOTTLES DRAWN AEROBIC AND ANAEROBIC Blood Culture adequate volume   Culture   Final  NO GROWTH 2 DAYS Performed at [facility], [address] [place], [place] [address]    Report Status PENDING  Incomplete  Respiratory Panel by PCR     Status: None   Collection Time: 01/23/19  3:10 PM  Result Value Ref Range Status   Adenovirus NOT DETECTED NOT DETECTED Final   Coronavirus 229E NOT DETECTED NOT DETECTED Final    Comment: (NOTE) The Coronavirus on the Respiratory Panel, DOES NOT test for the novel  Coronavirus (2019 nCoV)    Coronavirus HKU1 NOT DETECTED NOT DETECTED Final   Coronavirus NL63 NOT DETECTED NOT DETECTED Final   Coronavirus OC43 NOT DETECTED NOT DETECTED Final   Metapneumovirus NOT DETECTED NOT DETECTED Final   Rhinovirus / Enterovirus NOT DETECTED NOT DETECTED Final   Influenza A NOT DETECTED NOT DETECTED Final   Influenza B NOT DETECTED NOT DETECTED Final   Parainfluenza Virus 1 NOT DETECTED NOT DETECTED Final   Parainfluenza Virus 2 NOT DETECTED NOT DETECTED Final   Parainfluenza Virus 3 NOT DETECTED NOT DETECTED  Final   Parainfluenza Virus 4 NOT DETECTED NOT DETECTED Final   Respiratory Syncytial Virus NOT DETECTED NOT DETECTED Final   Bordetella pertussis NOT DETECTED NOT DETECTED Final   Chlamydophila pneumoniae NOT DETECTED NOT DETECTED Final   Mycoplasma pneumoniae NOT DETECTED NOT DETECTED Final    Comment: Performed at [facility], [place]. [address], [place], [place] [address]     Labs: BNP (last 3 results) Recent Labs    01/22/19 1521  BNP 732.2*   Basic Metabolic Panel: Recent Labs  Lab 01/22/19 1302 01/23/19 0601 01/24/19 1017  NA 137 139 138  K 3.7 3.7 3.8  CL 106 107 106  CO2 20* 22 25  GLUCOSE 100* 114* 147*  BUN 8 9 9   CREATININE 0.54 0.65 0.57  CALCIUM 9.1 9.0 8.8*  MG  --   --  1.9   Liver Function Tests: Recent Labs  Lab 01/22/19 1302 01/24/19 1017  AST 14* 15  ALT 14 13  ALKPHOS 52 51  BILITOT 0.3 0.6  PROT 7.6 7.5  ALBUMIN 4.1 4.0   No results for input(s): LIPASE, AMYLASE in the last 168 hours. No results for input(s): AMMONIA in the last 168 hours. CBC: Recent Labs  Lab 01/22/19 1302 01/23/19 0601 01/24/19 1017  WBC 7.3 8.6 6.2  NEUTROABS 5.0  --  PENDING  HGB 9.5* 9.2* 9.3*  HCT 34.9* 34.8* 34.2*  MCV 75.4* 77.0* 76.5*  PLT 465* 485* 425*   Cardiac Enzymes: Recent Labs  Lab 01/22/19 1302  TROPONINI <0.03   BNP: Invalid input(s): POCBNP CBG: Recent Labs  Lab 01/23/19 0759 01/23/19 1158 01/23/19 1652 01/23/19 2134 01/24/19 0744  GLUCAP 116* 142* 104* 114* 96   D-Dimer Recent Labs    01/22/19 1302 01/23/19 0601  DDIMER 1.70* 1.06*   Hgb A1c No results for input(s): HGBA1C in the last 72 hours. Lipid Profile No results for input(s): CHOL, HDL, LDLCALC, TRIG, CHOLHDL, LDLDIRECT in the last 72 hours. Thyroid function studies No results for input(s): TSH, T4TOTAL, T3FREE, THYROIDAB in the last 72 hours.  Invalid input(s): FREET3 Anemia work up No results for input(s): VITAMINB12, FOLATE, FERRITIN, TIBC, IRON,  RETICCTPCT in the last 72 hours. Urinalysis    Component Value Date/Time   COLORURINE STRAW (A) 01/22/2019 1455   APPEARANCEUR CLEAR 01/22/2019 1455   LABSPEC 1.033 (H) 01/22/2019 1455   PHURINE 5.0 01/22/2019 1455   GLUCOSEU NEGATIVE 01/22/2019 1455   HGBUR  NEGATIVE 01/22/2019 1455   [place] 01/22/2019 1455   [place] 01/22/2019 1455   PROTEINUR NEGATIVE 01/22/2019 1455   UROBILINOGEN 0.2 11/24/2010 1509   NITRITE NEGATIVE 01/22/2019 1455   LEUKOCYTESUR NEGATIVE 01/22/2019 1455   Sepsis Labs Invalid input(s): PROCALCITONIN,  WBC,  LACTICIDVEN Microbiology Recent Results (from the past 240 hour(s))  SARS Coronavirus 2 (CEPHEID- Performed in [place] hospital lab), Hosp Order     Status: None   Collection Time: 01/22/19  1:03 PM  Result Value Ref Range Status   SARS Coronavirus 2 NEGATIVE NEGATIVE Final    Comment: (NOTE) If result is NEGATIVE SARS-CoV-2 target nucleic acids are NOT DETECTED. The SARS-CoV-2 RNA is generally detectable in upper and lower  respiratory specimens during the acute phase of infection. The lowest  concentration of SARS-CoV-2 viral copies this assay can detect is 250  copies / mL. A negative result does not preclude SARS-CoV-2 infection  and should not be used as the sole basis for treatment or other  patient management decisions.  A negative result may occur with  improper specimen collection / handling, submission of specimen other  than nasopharyngeal swab, presence of viral mutation(s) within the  areas targeted by this assay, and inadequate number of viral copies  (<250 copies / mL). A negative result must be combined with clinical  observations, patient history, and epidemiological information. If result is POSITIVE SARS-CoV-2 target nucleic acids are DETECTED. The SARS-CoV-2 RNA is generally detectable in upper and lower  respiratory specimens dur ing the acute phase of infection.  Positive  results are indicative  of active infection with SARS-CoV-2.  Clinical  correlation with patient history and other diagnostic information is  necessary to determine patient infection status.  Positive results do  not rule out bacterial infection or co-infection with other viruses. If result is PRESUMPTIVE POSTIVE SARS-CoV-2 nucleic acids MAY BE PRESENT.   A presumptive positive result was obtained on the submitted specimen  and confirmed on repeat testing.  While 2019 novel coronavirus  (SARS-CoV-2) nucleic acids may be present in the submitted sample  additional confirmatory testing may be necessary for epidemiological  and / or clinical management purposes  to differentiate between  SARS-CoV-2 and other Sarbecovirus currently known to infect humans.  If clinically indicated additional testing with an alternate test  methodology [phone]) is advised. The SARS-CoV-2 RNA is generally  detectable in upper and lower respiratory sp ecimens during the acute  phase of infection. The expected result is Negative. Fact Sheet for Patients:  StrictlyIdeas.no Fact Sheet for Healthcare Providers: BankingDealers.co.za This test is not yet approved or cleared by the [place] FDA and has been authorized for detection and/or diagnosis of SARS-CoV-2 by FDA under an Emergency Use Authorization (EUA).  This EUA will remain in effect (meaning this test can be used) for the duration of the COVID-19 declaration under Section 564(b)(1) of the Act, 21 U.S.C. section 360bbb-3(b)(1), unless the authorization is terminated or revoked sooner. Performed at [facility], [address], [place], [place] [address]   Blood Culture (routine x 2)     Status: None (Preliminary result)   Collection Time: 01/22/19  3:25 PM  Result Value Ref Range Status   Specimen Description BLOOD RIGHT ARM  Final   Special Requests   Final    BOTTLES DRAWN AEROBIC AND ANAEROBIC Blood Culture adequate volume    Culture   Final    NO GROWTH 2 DAYS Performed at [facility], [address],  [place], [place] [address]    Report Status PENDING  Incomplete  Blood Culture (routine x 2)     Status: None (Preliminary result)   Collection Time: 01/22/19  3:26 PM  Result Value Ref Range Status   Specimen Description LEFT ANTECUBITAL  Final   Special Requests   Final    BOTTLES DRAWN AEROBIC AND ANAEROBIC Blood Culture adequate volume   Culture   Final    NO GROWTH 2 DAYS Performed at [facility], [address] [place], [place] [address]    Report Status PENDING  Incomplete  Respiratory Panel by PCR     Status: None   Collection Time: 01/23/19  3:10 PM  Result Value Ref Range Status   Adenovirus NOT DETECTED NOT DETECTED Final   Coronavirus 229E NOT DETECTED NOT DETECTED Final    Comment: (NOTE) The Coronavirus on the Respiratory Panel, DOES NOT test for the novel  Coronavirus (2019 nCoV)    Coronavirus HKU1 NOT DETECTED NOT DETECTED Final   Coronavirus NL63 NOT DETECTED NOT DETECTED Final   Coronavirus OC43 NOT DETECTED NOT DETECTED Final   Metapneumovirus NOT DETECTED NOT DETECTED Final   Rhinovirus / Enterovirus NOT DETECTED NOT DETECTED Final   Influenza A NOT DETECTED NOT DETECTED Final   Influenza B NOT DETECTED NOT DETECTED Final   Parainfluenza Virus 1 NOT DETECTED NOT DETECTED Final   Parainfluenza Virus 2 NOT DETECTED NOT DETECTED Final   Parainfluenza Virus 3 NOT DETECTED NOT DETECTED Final   Parainfluenza Virus 4 NOT DETECTED NOT DETECTED Final   Respiratory Syncytial Virus NOT DETECTED NOT DETECTED Final   Bordetella pertussis NOT DETECTED NOT DETECTED Final   Chlamydophila pneumoniae NOT DETECTED NOT DETECTED Final   Mycoplasma pneumoniae NOT DETECTED NOT DETECTED Final    Comment: Performed at [facility], [place] [address] [place], [place] [address]     Time coordinating discharge: 30 minutes  SIGNED:   Darliss Cheney, MD  [facility] 01/24/2019, 11:30  AM Pager [phone]  If 7PM-7AM, please contact night-coverage [email or web address] Password [email or web address]

## 2019-01-27 LAB — CULTURE, BLOOD (ROUTINE X 2)
Culture: NO GROWTH
Culture: NO GROWTH
Special Requests: ADEQUATE
Special Requests: ADEQUATE

## 2019-02-23 ENCOUNTER — Other Ambulatory Visit: Payer: Self-pay

## 2019-02-24 ENCOUNTER — Ambulatory Visit: Payer: Self-pay | Attending: Oncology

## 2019-03-02 ENCOUNTER — Ambulatory Visit: Payer: Medicaid Other

## 2020-06-12 ENCOUNTER — Emergency Department: Payer: 59

## 2020-06-12 ENCOUNTER — Observation Stay
Admission: EM | Admit: 2020-06-12 | Discharge: 2020-06-13 | Disposition: A | Discharge status: Home / Self Care | Payer: 59 | Attending: Internal Medicine | Admitting: Internal Medicine

## 2020-06-12 ENCOUNTER — Other Ambulatory Visit: Payer: Self-pay

## 2020-06-12 ENCOUNTER — Observation Stay: Payer: 59

## 2020-06-12 ENCOUNTER — Encounter: Payer: Self-pay | Admitting: Family Medicine

## 2020-06-12 DIAGNOSIS — Z20822 Contact with and (suspected) exposure to covid-19: Secondary | ICD-10-CM | POA: Diagnosis not present

## 2020-06-12 DIAGNOSIS — Z7982 Long term (current) use of aspirin: Secondary | ICD-10-CM | POA: Diagnosis not present

## 2020-06-12 DIAGNOSIS — Z7984 Long term (current) use of oral hypoglycemic drugs: Secondary | ICD-10-CM | POA: Diagnosis not present

## 2020-06-12 DIAGNOSIS — R739 Hyperglycemia, unspecified: Secondary | ICD-10-CM

## 2020-06-12 DIAGNOSIS — E1165 Type 2 diabetes mellitus with hyperglycemia: Secondary | ICD-10-CM | POA: Diagnosis not present

## 2020-06-12 DIAGNOSIS — Z79899 Other long term (current) drug therapy: Secondary | ICD-10-CM | POA: Insufficient documentation

## 2020-06-12 DIAGNOSIS — G459 Transient cerebral ischemic attack, unspecified: Secondary | ICD-10-CM | POA: Diagnosis not present

## 2020-06-12 DIAGNOSIS — R29810 Facial weakness: Secondary | ICD-10-CM | POA: Diagnosis present

## 2020-06-12 DIAGNOSIS — I10 Essential (primary) hypertension: Secondary | ICD-10-CM | POA: Diagnosis not present

## 2020-06-12 DIAGNOSIS — Z87891 Personal history of nicotine dependence: Secondary | ICD-10-CM | POA: Diagnosis not present

## 2020-06-12 DIAGNOSIS — I639 Cerebral infarction, unspecified: Secondary | ICD-10-CM | POA: Diagnosis not present

## 2020-06-12 HISTORY — DX: Hyperlipidemia, unspecified: E78.5

## 2020-06-12 HISTORY — DX: Unspecified systolic (congestive) heart failure: I50.20

## 2020-06-12 HISTORY — DX: Cerebral infarction, unspecified: I63.9

## 2020-06-12 LAB — CBC
HCT: 38.4 % (ref 36.0–46.0)
Hemoglobin: 12.6 g/dL (ref 12.0–15.0)
MCH: 27.9 pg (ref 26.0–34.0)
MCHC: 32.8 g/dL (ref 30.0–36.0)
MCV: 85.1 fL (ref 80.0–100.0)
Platelets: 404 10*3/uL — ABNORMAL HIGH (ref 150–400)
RBC: 4.51 MIL/uL (ref 3.87–5.11)
RDW: 12.6 % (ref 11.5–15.5)
WBC: 9.1 10*3/uL (ref 4.0–10.5)
nRBC: 0 % (ref 0.0–0.2)

## 2020-06-12 LAB — COMPREHENSIVE METABOLIC PANEL
ALT: 13 U/L (ref 0–44)
AST: 17 U/L (ref 15–41)
Albumin: 4.2 g/dL (ref 3.5–5.0)
Alkaline Phosphatase: 49 U/L (ref 38–126)
Anion gap: 13 (ref 5–15)
BUN: 10 mg/dL (ref 6–20)
CO2: 21 mmol/L — ABNORMAL LOW (ref 22–32)
Calcium: 9.3 mg/dL (ref 8.9–10.3)
Chloride: 100 mmol/L (ref 98–111)
Creatinine, Ser: 0.74 mg/dL (ref 0.44–1.00)
GFR calc Af Amer: >60 mL/min (ref >60)
GFR calc non Af Amer: >60 mL/min (ref >60)
Glucose, Bld: 248 mg/dL — ABNORMAL HIGH (ref 70–99)
Potassium: 3.4 mmol/L — ABNORMAL LOW (ref 3.5–5.1)
Sodium: 134 mmol/L — ABNORMAL LOW (ref 135–145)
Total Bilirubin: 0.8 mg/dL (ref 0.3–1.2)
Total Protein: 8 g/dL (ref 6.5–8.1)

## 2020-06-12 LAB — URINE DRUG SCREEN, QUALITATIVE (ARMC ONLY)
Amphetamines, Ur Screen: NOT DETECTED
Barbiturates, Ur Screen: NOT DETECTED
Benzodiazepine, Ur Scrn: NOT DETECTED
Cannabinoid 50 Ng, Ur ~~LOC~~: NOT DETECTED
Cocaine Metabolite,Ur ~~LOC~~: NOT DETECTED
MDMA (Ecstasy)Ur Screen: NOT DETECTED
Methadone Scn, Ur: NOT DETECTED
Opiate, Ur Screen: NOT DETECTED
Phencyclidine (PCP) Ur S: NOT DETECTED
Tricyclic, Ur Screen: NOT DETECTED

## 2020-06-12 LAB — RESPIRATORY PANEL BY RT PCR (FLU A&B, COVID)
Influenza A by PCR: NEGATIVE
Influenza B by PCR: NEGATIVE
SARS Coronavirus 2 by RT PCR: NEGATIVE

## 2020-06-12 LAB — TSH: TSH: 1.251 u[IU]/mL (ref 0.350–4.500)

## 2020-06-12 LAB — GLUCOSE, CAPILLARY
Glucose-Capillary: 209 mg/dL — ABNORMAL HIGH (ref 70–99)
Glucose-Capillary: 205 mg/dL — ABNORMAL HIGH (ref 70–99)

## 2020-06-12 LAB — TROPONIN I (HIGH SENSITIVITY)
Troponin I (High Sensitivity): 5 ng/L (ref <18)
Troponin I (High Sensitivity): 4 ng/L (ref <18)

## 2020-06-12 LAB — DIFFERENTIAL
Abs Immature Granulocytes: 0.13 10*3/uL — ABNORMAL HIGH (ref 0.00–0.07)
Basophils Absolute: 0.1 10*3/uL (ref 0.0–0.1)
Basophils Relative: 1 %
Eosinophils Absolute: 0.3 10*3/uL (ref 0.0–0.5)
Eosinophils Relative: 3 %
Immature Granulocytes: 1 %
Lymphocytes Relative: 24 %
Lymphs Abs: 2.2 10*3/uL (ref 0.7–4.0)
Monocytes Absolute: 0.6 10*3/uL (ref 0.1–1.0)
Monocytes Relative: 7 %
Neutro Abs: 5.9 10*3/uL (ref 1.7–7.7)
Neutrophils Relative %: 64 %

## 2020-06-12 LAB — PROTIME-INR
INR: 0.9 (ref 0.8–1.2)
Prothrombin Time: 12.2 seconds (ref 11.4–15.2)

## 2020-06-12 LAB — APTT: aPTT: 28 seconds (ref 24–36)

## 2020-06-12 LAB — MAGNESIUM: Magnesium: 1.4 mg/dL — ABNORMAL LOW (ref 1.7–2.4)

## 2020-06-12 LAB — POCT PREGNANCY, URINE: Preg Test, Ur: NEGATIVE

## 2020-06-12 LAB — HEMOGLOBIN A1C
Hgb A1c MFr Bld: 7 % — ABNORMAL HIGH (ref 4.8–5.6)
Mean Plasma Glucose: 154.2 mg/dL

## 2020-06-12 MED ORDER — SENNOSIDES-DOCUSATE SODIUM 8.6-50 MG PO TABS: 1.0000 | ORAL_TABLET | Freq: Every evening | ORAL | Status: DC | PRN

## 2020-06-12 MED ORDER — POTASSIUM CHLORIDE CRYS ER 20 MEQ PO TBCR
40.0000 meq | EXTENDED_RELEASE_TABLET | Freq: Once | ORAL | Status: AC
  Administered 2020-06-12: 40 meq via ORAL
  Filled 2020-06-12: qty 2

## 2020-06-12 MED ORDER — ADULT MULTIVITAMIN W/MINERALS CH
1.0000 | ORAL_TABLET | Freq: Every day | ORAL | Status: DC
  Administered 2020-06-13: 1 via ORAL
  Filled 2020-06-12: qty 1

## 2020-06-12 MED ORDER — ASPIRIN 81 MG PO CHEW
81.0000 mg | CHEWABLE_TABLET | Freq: Every day | ORAL | Status: DC
  Administered 2020-06-13: 81 mg via ORAL
  Filled 2020-06-12: qty 1

## 2020-06-12 MED ORDER — ASPIRIN 325 MG PO TABS
325.0000 mg | ORAL_TABLET | Freq: Once | ORAL | Status: AC
  Administered 2020-06-12: 325 mg via ORAL
  Filled 2020-06-12 (×2): qty 1

## 2020-06-12 MED ORDER — ACETAMINOPHEN 650 MG RE SUPP: 650.0000 mg | RECTAL | Status: DC | PRN

## 2020-06-12 MED ORDER — SODIUM CHLORIDE 0.9% FLUSH: 3.0000 mL | Freq: Once | INTRAVENOUS | Status: DC

## 2020-06-12 MED ORDER — FERROUS SULFATE 325 (65 FE) MG PO TABS
325.0000 mg | ORAL_TABLET | Freq: Three times a day (TID) | ORAL | Status: DC
  Administered 2020-06-13 (×3): 325 mg via ORAL
  Filled 2020-06-12 (×5): qty 1

## 2020-06-12 MED ORDER — LACTATED RINGERS IV BOLUS
1000.0000 mL | Freq: Once | INTRAVENOUS | Status: AC
  Administered 2020-06-12: 1000 mL via INTRAVENOUS

## 2020-06-12 MED ORDER — STROKE: EARLY STAGES OF RECOVERY BOOK: Freq: Once | Status: DC

## 2020-06-12 MED ORDER — GADOBUTROL 1 MMOL/ML IV SOLN
8.0000 mL | Freq: Once | INTRAVENOUS | Status: AC | PRN
  Administered 2020-06-12: 8 mL via INTRAVENOUS

## 2020-06-12 MED ORDER — ACETAMINOPHEN 160 MG/5ML PO SOLN
650.0000 mg | ORAL | Status: DC | PRN
  Filled 2020-06-12: qty 20.3

## 2020-06-12 MED ORDER — AMLODIPINE BESYLATE 5 MG PO TABS
10.0000 mg | ORAL_TABLET | Freq: Every day | ORAL | Status: DC
  Administered 2020-06-12: 10 mg via ORAL
  Filled 2020-06-12: qty 2

## 2020-06-12 MED ORDER — LABETALOL HCL 5 MG/ML IV SOLN
10.0000 mg | Freq: Once | INTRAVENOUS | Status: AC
  Administered 2020-06-12: 10 mg via INTRAVENOUS
  Filled 2020-06-12: qty 4

## 2020-06-12 MED ORDER — SODIUM CHLORIDE 0.9 % IV SOLN: INTRAVENOUS | Status: DC

## 2020-06-12 MED ORDER — ASPIRIN 325 MG PO TABS: 325.0000 mg | ORAL_TABLET | Freq: Every day | ORAL | Status: DC

## 2020-06-12 MED ORDER — ASPIRIN EC 81 MG PO TBEC: 81.0000 mg | DELAYED_RELEASE_TABLET | Freq: Every day | ORAL | Status: DC

## 2020-06-12 MED ORDER — MAGNESIUM SULFATE IN D5W 1-5 GM/100ML-% IV SOLN
1.0000 g | Freq: Once | INTRAVENOUS | Status: AC
  Administered 2020-06-13: 1 g via INTRAVENOUS
  Filled 2020-06-12: qty 100

## 2020-06-12 MED ORDER — INSULIN ASPART 100 UNIT/ML ~~LOC~~ SOLN
0.0000 [IU] | SUBCUTANEOUS | Status: DC
  Administered 2020-06-12 (×2): 5 [IU] via SUBCUTANEOUS
  Administered 2020-06-13: 3 [IU] via SUBCUTANEOUS
  Administered 2020-06-13: 2 [IU] via SUBCUTANEOUS
  Administered 2020-06-13 (×3): 3 [IU] via SUBCUTANEOUS
  Filled 2020-06-12 (×7): qty 1

## 2020-06-12 MED ORDER — ACETAMINOPHEN 325 MG PO TABS: 650.0000 mg | ORAL_TABLET | ORAL | Status: DC | PRN

## 2020-06-12 NOTE — ED Notes (Signed)
Pt to CT scan at this time.
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
## 2020-06-12 NOTE — ED Notes (Signed)
Pt talking on cell phone to family without diff.  Pt tearful at times, states i'm stressed.

## 2020-06-12 NOTE — ED Notes (Signed)
poct pregnancy Negative 
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
## 2020-06-12 NOTE — Progress Notes (Signed)
CODE STROKE- PHARMACY COMMUNICATION   Time CODE STROKE called/page received:[id number]  Time response to CODE STROKE was made (in person or via phone): followed chart  Time Stroke Kit retrieved from [place] (only if needed): NA  Name of Provider/Nurse contacted: no tPA per neurology note, symptoms fully resolved   Tawnya Crook ,PharmD Clinical Pharmacist  06/12/2020  7:56 PM

## 2020-06-12 NOTE — ED Notes (Signed)
Pt alert  Speech clear.  Iv fluids infusing  nsr on monitor.

## 2020-06-12 NOTE — ED Notes (Signed)
Pt to ED 26 RN Amy at bedside, MD Tamala Julian at bedside, [place] at bedside starting IV

## 2020-06-12 NOTE — ED Notes (Signed)
Report off to chrissy rn  

## 2020-06-12 NOTE — ED Notes (Signed)
Pt transported to MRI 
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
## 2020-06-12 NOTE — ED Notes (Signed)
Pt to xray after meds given.

## 2020-06-13 ENCOUNTER — Other Ambulatory Visit: Payer: Self-pay

## 2020-06-13 ENCOUNTER — Observation Stay (HOSPITAL_BASED_OUTPATIENT_CLINIC_OR_DEPARTMENT_OTHER)
Admit: 2020-06-13 | Discharge: 2020-06-13 | Disposition: A | Discharge status: Home / Self Care | Payer: 59 | Attending: Neurology | Admitting: Neurology

## 2020-06-13 ENCOUNTER — Encounter: Payer: Self-pay | Admitting: Family Medicine

## 2020-06-13 DIAGNOSIS — I10 Essential (primary) hypertension: Secondary | ICD-10-CM | POA: Diagnosis not present

## 2020-06-13 DIAGNOSIS — I6389 Other cerebral infarction: Secondary | ICD-10-CM

## 2020-06-13 DIAGNOSIS — R739 Hyperglycemia, unspecified: Secondary | ICD-10-CM

## 2020-06-13 DIAGNOSIS — I429 Cardiomyopathy, unspecified: Secondary | ICD-10-CM | POA: Diagnosis not present

## 2020-06-13 DIAGNOSIS — I639 Cerebral infarction, unspecified: Secondary | ICD-10-CM | POA: Diagnosis not present

## 2020-06-13 DIAGNOSIS — E785 Hyperlipidemia, unspecified: Secondary | ICD-10-CM

## 2020-06-13 DIAGNOSIS — I340 Nonrheumatic mitral (valve) insufficiency: Secondary | ICD-10-CM | POA: Diagnosis not present

## 2020-06-13 LAB — URINALYSIS, DIPSTICK ONLY
Bilirubin Urine: NEGATIVE
Glucose, UA: 150 mg/dL — AB
Hgb urine dipstick: NEGATIVE
Ketones, ur: 20 mg/dL — AB
Nitrite: NEGATIVE
Protein, ur: 30 mg/dL — AB
Specific Gravity, Urine: 1.026 (ref 1.005–1.030)
pH: 5 (ref 5.0–8.0)

## 2020-06-13 LAB — GLUCOSE, CAPILLARY
Glucose-Capillary: 147 mg/dL — ABNORMAL HIGH (ref 70–99)
Glucose-Capillary: 156 mg/dL — ABNORMAL HIGH (ref 70–99)
Glucose-Capillary: 158 mg/dL — ABNORMAL HIGH (ref 70–99)
Glucose-Capillary: 161 mg/dL — ABNORMAL HIGH (ref 70–99)
Glucose-Capillary: 167 mg/dL — ABNORMAL HIGH (ref 70–99)

## 2020-06-13 LAB — SEDIMENTATION RATE: Sed Rate: 35 mm/hr — ABNORMAL HIGH (ref 0–30)

## 2020-06-13 LAB — ECHOCARDIOGRAM COMPLETE: S' Lateral: 4.99 cm

## 2020-06-13 LAB — LIPID PANEL
Cholesterol: 293 mg/dL — ABNORMAL HIGH (ref 0–200)
HDL: 60 mg/dL (ref >40)
LDL Cholesterol: 207 mg/dL — ABNORMAL HIGH (ref 0–99)
Total CHOL/HDL Ratio: 4.9 RATIO
Triglycerides: 128 mg/dL (ref <150)
VLDL: 26 mg/dL (ref 0–40)

## 2020-06-13 LAB — RPR: RPR Ser Ql: NONREACTIVE

## 2020-06-13 LAB — HIV ANTIBODY (ROUTINE TESTING W REFLEX): HIV Screen 4th Generation wRfx: NONREACTIVE

## 2020-06-13 MED ORDER — CARVEDILOL 3.125 MG PO TABS
3.1250 mg | ORAL_TABLET | Freq: Two times a day (BID) | ORAL | 0 refills | Status: DC
Start: 2020-06-13 — End: 2020-06-19

## 2020-06-13 MED ORDER — CLOPIDOGREL BISULFATE 75 MG PO TABS
300.0000 mg | ORAL_TABLET | Freq: Once | ORAL | Status: AC
  Administered 2020-06-13: 300 mg via ORAL
  Filled 2020-06-13: qty 4

## 2020-06-13 MED ORDER — MELATONIN 5 MG PO TABS
5.0000 mg | ORAL_TABLET | Freq: Every day | ORAL | Status: DC
  Administered 2020-06-13: 5 mg via ORAL
  Filled 2020-06-13 (×2): qty 1

## 2020-06-13 MED ORDER — CLOPIDOGREL BISULFATE 75 MG PO TABS: 75.0000 mg | ORAL_TABLET | Freq: Every day | ORAL | Status: DC

## 2020-06-13 MED ORDER — CARVEDILOL 3.125 MG PO TABS
3.1250 mg | ORAL_TABLET | Freq: Two times a day (BID) | ORAL | Status: DC
  Administered 2020-06-13: 3.125 mg via ORAL
  Filled 2020-06-13: qty 1

## 2020-06-13 MED ORDER — LOSARTAN POTASSIUM 25 MG PO TABS
25.0000 mg | ORAL_TABLET | Freq: Every day | ORAL | 0 refills | Status: DC
Start: 2020-06-13 — End: 2020-07-12

## 2020-06-13 MED ORDER — ROSUVASTATIN CALCIUM 10 MG PO TABS
40.0000 mg | ORAL_TABLET | Freq: Every day | ORAL | Status: DC
  Administered 2020-06-13: 40 mg via ORAL
  Filled 2020-06-13: qty 2

## 2020-06-13 MED ORDER — ROSUVASTATIN CALCIUM 40 MG PO TABS
40.0000 mg | ORAL_TABLET | Freq: Every day | ORAL | 0 refills | Status: DC
Start: 2020-06-14 — End: 2020-07-18

## 2020-06-13 MED ORDER — CLOPIDOGREL BISULFATE 75 MG PO TABS
75.0000 mg | ORAL_TABLET | Freq: Every day | ORAL | 0 refills | Status: DC
Start: 2020-06-14 — End: 2020-12-13

## 2020-06-13 MED ORDER — LOSARTAN POTASSIUM 25 MG PO TABS
12.5000 mg | ORAL_TABLET | Freq: Every day | ORAL | Status: DC
  Administered 2020-06-13: 12.5 mg via ORAL
  Filled 2020-06-13: qty 1

## 2020-06-13 NOTE — Progress Notes (Signed)
*  PRELIMINARY RESULTS* Echocardiogram 2D Echocardiogram has been performed.  Sherrie Sport 06/13/2020, 8:37 AM
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
## 2020-06-13 NOTE — ED Notes (Signed)
Dr. Ara Kussmaul at bedside.

## 2020-06-13 NOTE — Discharge Instructions (Signed)

## 2020-06-13 NOTE — ED Notes (Signed)
This RN spoke with Roselyn Reef RN to read chart and call back with questions

## 2020-06-13 NOTE — Progress Notes (Addendum)
Neurology Progress Note  Patient ID: Stacey Richardson is a 54 y.o. with PMHx of  has a past medical history of Diabetes mellitus and Hypertension.  Initially consulted for: Code stroke for aphasia  Major interval events:  -Work-up complete  Subjective: -Feels speech has largely improved, has some residual mild slowness of speech that she subjectively notices  Exam: Vitals:   06/13/20 0500 06/13/20 0600  BP: 135/73 128/72  Pulse: 83 80  Resp: 19 12  Temp:    SpO2: 99% 99%   Gen: In bed, comfortable  Resp: non-labored breathing, no grossly audible wheezing Cardiac: Perfusing extremities well  Abd: soft, nt  Neuro: MS: Alert, awake, oriented to person/place/time/situation, able to follow complex commands, able to name and repeat, perhaps very mildly effortful speech at times CN: Face symmetric, tongue midline, external ocular movements grossly intact Motor: No pronator drift, moving all extremities equally  Pertinent Labs/Imaging: Utox negative   Lab Results  Component Value Date   CHOL 293 (H) 06/13/2020   HDL 60 06/13/2020   LDLCALC 207 (H) 06/13/2020   TRIG 128 06/13/2020   CHOLHDL 4.9 06/13/2020   Lab Results  Component Value Date   TSH 1.251 06/12/2020   Lab Results  Component Value Date   ESRSEDRATE 35 (H) 06/13/2020   Lab Results  Component Value Date   HGBA1C 7.0 (H) 06/12/2020   HIV and RPR both negative  MRI brain and MRA head and neck personally reviewed Agree with radiology that the small ICA stenosis bilaterally is most likely artifactual; she does not appear to have significant atherosclerosis.  Key image showing bright DWI lesion on the left with ADC correlate on the right   ECHO pending   Impression: This is a 53 year old woman with uncontrolled hyperlipidemia (cholesterol 293 and LDL 207), hypertension, and reasonably controlled diabetes (A1c 7.0% this admission), who presented with rapidly resolving symptoms for which tPA was not given due  to minimal symptoms.  The very small stroke on her MRI brain matches her currently asymptomatic examination.  The location of the stroke and description of the event suggests an embolic source, more likely cardioembolic than atheroembolic given her vessel imaging, and ECHO with EF of 35 to 40% with mildly dilated left ventricle.  However given no atrial fibrillation has been captured yet and she does not have any other indication for anticoagulation, will additionally load with Plavix and continue this medication for a 21-day course per CHANCE/POINT trials   Unfortunately her ECHO was technically challenging, without a clear view of atrial size  Recommendations: -300 mg Plavix load, then 75 mg daily for a 21-day course of DAPT -Continue aspirin 81 mg indefinitely -30-day event monitor at discharge -Continue Crestor 40 mg nightly -Consider referral to lipid clinic if her lipids are refractory to Crestor and dietary changes -Patient extensively counseled on lifestyle modifications by myself today -Outpatient stroke follow-up with neurology -Outpatient sleep study given reporting snoring (sleep apnea can contribute to stroke risk)  Lesleigh Noe MD-PhD [facility] [phone]

## 2020-06-13 NOTE — Discharge Summary (Signed)
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 06/13/20 0600 06/13/20 0940 06/13/20 1117  BP: 135/73 128/72 (!) 144/82 136/86  Pulse: 83 80  89  Resp: 19 12 (!) 23 19  Temp:    98.8 F (37.1 C)  TempSrc:    Oral  SpO2: 99% 99%  100%  Weight:    88.5 kg  Height:    5\' 5"  (1.651 m)    General: Pt is alert, awake, not in acute distress Cardiovascular: RRR, S1/S2 +, no rubs, no gallops Respiratory: CTA bilaterally, no wheezing, no rhonchi Abdominal: Soft, NT, ND, bowel sounds + Extremities: no edema, no cyanosis   The results of significant diagnostics from this hospitalization (including imaging, microbiology, ancillary and laboratory) are listed below for reference.    Microbiology: Recent Results (from the past 240 hour(s))  Respiratory Panel by RT PCR (Flu A&B, Covid) - Nasopharyngeal Swab     Status: None   Collection Time: 06/12/20  5:04 PM   Specimen: Nasopharyngeal Swab  Result Value Ref Range Status   SARS Coronavirus 2 by RT PCR NEGATIVE NEGATIVE Final    Comment: (NOTE) SARS-CoV-2 target nucleic acids are NOT DETECTED.  The SARS-CoV-2 RNA is  generally detectable in upper respiratoy specimens during the acute phase of infection. The lowest concentration of SARS-CoV-2 viral copies this assay can detect is 131 copies/mL. A negative result does not preclude SARS-Cov-2 infection and should not be used as the sole basis for treatment or other patient management decisions. A negative result may occur with  improper specimen collection/handling, submission of specimen other than nasopharyngeal swab, presence of viral mutation(s) within the areas targeted by this assay, and inadequate number of viral copies (<131 copies/mL). A negative result must be combined with clinical observations, patient history, and epidemiological information. The expected result is Negative.  Fact Sheet for Patients:  PinkCheek.be  Fact Sheet for Healthcare Providers:  GravelBags.it  This test is no t yet approved or cleared by the [place] FDA and  has been authorized for detection and/or diagnosis  of SARS-CoV-2 by FDA under an Emergency Use Authorization (EUA). This EUA will remain  in effect (meaning this test can be used) for the duration of the COVID-19 declaration under Section 564(b)(1) of the Act, 21 U.S.C. section 360bbb-3(b)(1), unless the authorization is terminated or revoked sooner.     Influenza A by PCR NEGATIVE NEGATIVE Final   Influenza B by PCR NEGATIVE NEGATIVE Final    Comment: (NOTE) The Xpert Xpress SARS-CoV-2/FLU/RSV assay is intended as an aid in  the diagnosis of influenza from Nasopharyngeal swab specimens and  should not be used as a sole basis for treatment. Nasal washings and  aspirates are unacceptable for Xpert Xpress SARS-CoV-2/FLU/RSV  testing.  Fact Sheet for Patients: PinkCheek.be  Fact Sheet for Healthcare Providers: GravelBags.it  This test is not yet approved or cleared by the [place] FDA and  has been authorized for detection and/or diagnosis of SARS-CoV-2 by  FDA under an Emergency Use Authorization (EUA). This EUA will remain  in effect (meaning this test can be used) for the duration of the  Covid-19 declaration under Section 564(b)(1) of the Act, 21  U.S.C. section 360bbb-3(b)(1), unless the authorization is  terminated or revoked. Performed at [facility], [address] [place], [place] [address]      Labs: BNP (last 3 results) No results for input(s): BNP in the last 8760 hours. Basic Metabolic Panel: Recent Labs  Lab 06/12/20 1704  NA 134*  K 3.4*  CL 100  CO2 21*  GLUCOSE 248*  BUN 10  CREATININE 0.74  CALCIUM 9.3  MG 1.4*   Liver Function Tests: Recent Labs  Lab 06/12/20 1704  AST 17  ALT 13  ALKPHOS 49  BILITOT 0.8  PROT 8.0  ALBUMIN 4.2   No results for input(s): LIPASE, AMYLASE in the last 168 hours. No results for input(s): AMMONIA in the last 168 hours. CBC: Recent Labs  Lab 06/12/20 1704  WBC 9.1  NEUTROABS 5.9  HGB 12.6  HCT 38.4  MCV 85.1  PLT 404*   Cardiac Enzymes: No results for input(s): CKTOTAL, CKMB, CKMBINDEX, TROPONINI in the last 168 hours. BNP: Invalid input(s): POCBNP CBG: Recent Labs  Lab 06/12/20 1936 06/13/20 0011 06/13/20 0315 06/13/20 0935 06/13/20 1117  GLUCAP 205* 156* 161* 147* 167*   D-Dimer No results for input(s): DDIMER in the last 72 hours. Hgb A1c Recent Labs    06/12/20 1704  HGBA1C 7.0*   Lipid Profile Recent Labs    06/13/20 0318  CHOL 293*  HDL 60  LDLCALC 207*  TRIG 128  CHOLHDL 4.9   Thyroid function studies Recent Labs    06/12/20 1704  TSH 1.251   Anemia work up No results for input(s): VITAMINB12, FOLATE, FERRITIN, TIBC, IRON, RETICCTPCT in the last 72 hours. Urinalysis    Component Value Date/Time   COLORURINE YELLOW (A) 06/12/2020 1704   APPEARANCEUR TURBID (A) 06/12/2020 1704   LABSPEC 1.026 06/12/2020 1704   PHURINE 5.0  06/12/2020 1704   GLUCOSEU 150 (A) 06/12/2020 1704   HGBUR NEGATIVE 06/12/2020 1704   BILIRUBINUR NEGATIVE 06/12/2020 1704   KETONESUR 20 (A) 06/12/2020 1704   PROTEINUR 30 (A) 06/12/2020 1704   UROBILINOGEN 0.2 11/24/2010 1509   NITRITE NEGATIVE 06/12/2020 1704   LEUKOCYTESUR TRACE (A) 06/12/2020 1704   Sepsis Labs Invalid input(s): PROCALCITONIN,  WBC,  LACTICIDVEN Microbiology Recent Results (from the past 240 hour(s))  Respiratory Panel by RT PCR (Flu A&B, Covid) - Nasopharyngeal Swab  Status: None   Collection Time: 06/12/20  5:04 PM   Specimen: Nasopharyngeal Swab  Result Value Ref Range Status   SARS Coronavirus 2 by RT PCR NEGATIVE NEGATIVE Final    Comment: (NOTE) SARS-CoV-2 target nucleic acids are NOT DETECTED.  The SARS-CoV-2 RNA is generally detectable in upper respiratoy specimens during the acute phase of infection. The lowest concentration of SARS-CoV-2 viral copies this assay can detect is 131 copies/mL. A negative result does not preclude SARS-Cov-2 infection and should not be used as the sole basis for treatment or other patient management decisions. A negative result may occur with  improper specimen collection/handling, submission of specimen other than nasopharyngeal swab, presence of viral mutation(s) within the areas targeted by this assay, and inadequate number of viral copies (<131 copies/mL). A negative result must be combined with clinical observations, patient history, and epidemiological information. The expected result is Negative.  Fact Sheet for Patients:  PinkCheek.be  Fact Sheet for Healthcare Providers:  GravelBags.it  This test is no t yet approved or cleared by the [place] FDA and  has been authorized for detection and/or diagnosis of SARS-CoV-2 by FDA under an Emergency Use Authorization (EUA). This EUA will remain  in effect (meaning this test can be used) for the  duration of the COVID-19 declaration under Section 564(b)(1) of the Act, 21 U.S.C. section 360bbb-3(b)(1), unless the authorization is terminated or revoked sooner.     Influenza A by PCR NEGATIVE NEGATIVE Final   Influenza B by PCR NEGATIVE NEGATIVE Final    Comment: (NOTE) The Xpert Xpress SARS-CoV-2/FLU/RSV assay is intended as an aid in  the diagnosis of influenza from Nasopharyngeal swab specimens and  should not be used as a sole basis for treatment. Nasal washings and  aspirates are unacceptable for Xpert Xpress SARS-CoV-2/FLU/RSV  testing.  Fact Sheet for Patients: PinkCheek.be  Fact Sheet for Healthcare Providers: GravelBags.it  This test is not yet approved or cleared by the [place] FDA and  has been authorized for detection and/or diagnosis of SARS-CoV-2 by  FDA under an Emergency Use Authorization (EUA). This EUA will remain  in effect (meaning this test can be used) for the duration of the  Covid-19 declaration under Section 564(b)(1) of the Act, 21  U.S.C. section 360bbb-3(b)(1), unless the authorization is  terminated or revoked. Performed at [facility], [address] [place], [place] [address]     Time coordinating discharge: Over 30 minutes  SIGNED:  Lorella Nimrod, MD  [facility] 06/13/2020, 4:50 PM  If 7PM-7AM, please contact night-coverage [email or web address]  This record has been created using Systems analyst. Errors have been sought and corrected,but may not always be located. Such creation errors do not reflect on the standard of care.

## 2020-06-14 ENCOUNTER — Telehealth: Payer: Self-pay

## 2020-06-14 NOTE — Telephone Encounter (Signed)
-----   Message from Rise Mu, PA-C sent at 06/13/2020  4:55 PM EDT ----- Please schedule patient for TCM follow-up in 1 to 2 weeks with APP or Dr. Saunders Revel.

## 2020-06-14 NOTE — Progress Notes (Cosign Needed)
[place]  Physical Therapy Certification  Patient Details  Name: Stacey Richardson MRN: [id number] Date of Birth: 08-21-1966 Medical Diagnosis: Active Problems:   TIA (transient ischemic attack)   Hyperglycemia   Cerebrovascular accident (CVA) ([place])  Visit Diagnosis: PT Visit Diagnosis: Other symptoms and signs involving the nervous system (R29.898) PT Problem: Decreased balance, Decreased mobility  Goals:  Eval only.  Duration: Services will be provided through the following date: 06/14/20  Frequency:  Eval only.  Amount: one treatment session per day unless otherwise indicated.    Certification Start Date: 64/02/8031 Certification End Date: 06/14/20   PT Treatments/Interventions: Gait training, Functional mobility training, Therapeutic activities, Neuromuscular re-education, Patient/family education  Ileene Allie H. Owens Shark, PT, DPT, NCS 06/14/20, 8:49 AM [phone]

## 2020-06-14 NOTE — Evaluation (Signed)
Physical Therapy Evaluation Patient Details Name: Stacey Richardson MRN: [id number] DOB: [birth year] Today's Date: 06/14/2020   History of Present Illness  Pt is a 54 y/o F with PMH: DM and HTN. 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 Pt also found to have elevated lipid levels.  Clinical Impression  Upon evaluation, patient alert and oriented; follows commands and demonstrates good effort with mobility tasks.  Reports improvement in speech since admission and feels R UE strength/control has returned to baseline.  Bilat UE/LE strength and ROM grossly symmetrical and WFL; no focal weakness, sensory or coordination deficit noted with isolated testing.  Able to complete bed mobility with indep; sit/stand, basic transfers and gait (200') without assist device, indep.  Demonstrates reciprocal stepping pattern with no buckling, LOB or safety concern; good cadence, gait speed and overall mechanics/fluidity; completes dynamic gait components without difficulty Appears to be at baseline level of functional ability; no acute PT needs identified at this time.  Patient voiced agreement.  Will complete initial order; please re-consult should needs change.    Follow Up Recommendations No PT follow up    Equipment Recommendations       Recommendations for Other Services       Precautions / Restrictions Precautions Precautions: None Restrictions Weight Bearing Restrictions: No      Mobility  Bed Mobility Overal bed mobility: Independent                Transfers Overall transfer level: Independent Equipment used: None             General transfer comment: good strength, power and control  Ambulation/Gait Ambulation/Gait assistance:  Independent Gait Distance (Feet): 200 Feet Assistive device: None       General Gait Details: reciprocal stepping pattern with no buckling, LOB or safety concern; good cadence, gait speed and overall mechanics/fluidity; completes dynamic gait components without difficulty  Stairs            Wheelchair Mobility    Modified Rankin (Stroke Patients Only)       Balance Overall balance assessment: Independent               Single Leg Stance - Right Leg: 15 Single Leg Stance - Left Leg: 15     [place]berg - Eyes Opened: 30 [place]berg - Eyes Closed: 30                 Pertinent Vitals/Pain Pain Assessment: No/denies pain    Home Living Family/patient expects to be discharged to:: Private residence Living Arrangements: Spouse/significant other Available Help at Discharge: Family Type of Home: House Home Access: Stairs to enter Entrance Stairs-Rails: Right;Left;Can reach both Technical brewer of Steps: 8 Home Layout: One level Home Equipment: None      Prior Function Level of Independence: Independent         Comments: 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, drives, performs all aspects of self care and mobility indep     Hand Dominance   Dominant Hand: Right    Extremity/Trunk Assessment   Upper Extremity Assessment Upper Extremity Assessment: Overall WFL for tasks assessed    Lower Extremity Assessment Lower Extremity Assessment: Overall WFL for tasks assessed (grossly 4+ to 5/5 throughout; no focal weakness, sensory or coordination deficit appreciated)  Communication   Communication: No difficulties  Cognition Arousal/Alertness: Awake/alert Behavior During Therapy: WFL for tasks assessed/performed Overall Cognitive Status: Within Functional Limits for tasks assessed                                        General Comments      Exercises     Assessment/Plan    PT Assessment Patent does not need any further PT services   PT Problem List         PT Treatment Interventions      PT Goals (Current goals can be found in the Care Plan section)  Acute Rehab PT Goals Patient Stated Goal: to go home and start working on a healthier lifestyle PT Goal Formulation: All assessment and education complete, DC therapy Time For Goal Achievement: 06/14/20 Potential to Achieve Goals: Good    Frequency     Barriers to discharge        Co-evaluation               AM-PAC PT "6 Clicks" Mobility  Outcome Measure Help needed turning from your back to your side while in a flat bed without using bedrails?: None Help needed moving from lying on your back to sitting on the side of a flat bed without using bedrails?: None Help needed moving to and from a bed to a chair (including a wheelchair)?: None Help needed standing up from a chair using your arms (e.g., wheelchair or bedside chair)?: None Help needed to walk in hospital room?: None Help needed climbing 3-5 steps with a railing? : None 6 Click Score: 24    End of Session Equipment Utilized During Treatment: Gait belt Activity Tolerance: Patient tolerated treatment well Patient left: in bed;with call bell/phone within reach   PT Visit Diagnosis: Other symptoms and signs involving the nervous system (R29.898)    Time: 8916-9450 PT Time Calculation (min) (ACUTE ONLY): 14 min   Charges:   PT Evaluation $PT Eval Low Complexity: 1 Low          Hiilei Gerst H. Owens Shark, PT, DPT, NCS 06/14/20, 8:48 AM [phone]  Late-entry documentation added 06/14/2020 to reflect session on 06/13/2020

## 2020-06-14 NOTE — Telephone Encounter (Signed)
l mom to schedule hosp f/u in 1-2 weeks

## 2020-06-19 ENCOUNTER — Other Ambulatory Visit: Payer: Self-pay

## 2020-06-19 ENCOUNTER — Ambulatory Visit: Payer: 59 | Admitting: Internal Medicine

## 2020-06-19 ENCOUNTER — Ambulatory Visit (INDEPENDENT_AMBULATORY_CARE_PROVIDER_SITE_OTHER): Payer: 59

## 2020-06-19 ENCOUNTER — Encounter: Payer: Self-pay | Admitting: Internal Medicine

## 2020-06-19 VITALS — BP 142/94 | HR 99 | Ht 65.0 in | Wt 199.0 lb

## 2020-06-19 DIAGNOSIS — I5020 Unspecified systolic (congestive) heart failure: Secondary | ICD-10-CM | POA: Diagnosis not present

## 2020-06-19 DIAGNOSIS — I639 Cerebral infarction, unspecified: Secondary | ICD-10-CM

## 2020-06-19 DIAGNOSIS — I10 Essential (primary) hypertension: Secondary | ICD-10-CM

## 2020-06-19 DIAGNOSIS — E785 Hyperlipidemia, unspecified: Secondary | ICD-10-CM

## 2020-06-19 DIAGNOSIS — I429 Cardiomyopathy, unspecified: Secondary | ICD-10-CM | POA: Diagnosis not present

## 2020-06-19 MED ORDER — CARVEDILOL 6.25 MG PO TABS: 6.2500 mg | ORAL_TABLET | Freq: Two times a day (BID) | ORAL | 1 refills | Status: DC

## 2020-06-19 NOTE — Patient Instructions (Signed)
Medication Instructions:  Your physician has recommended you make the following change in your medication:  1- INCREASE Carvedilol 6.25 mg by mouth two times a day.  *If you need a refill on your cardiac medications before your next appointment, please call your pharmacy*  Lab Work: none If you have labs (blood work) drawn today and your tests are completely normal, you will receive your results only by: Marland Kitchen MyChart Message (if you have MyChart) OR . A paper copy in the mail If you have any lab test that is abnormal or we need to change your treatment, we will call you to review the results.   Testing/Procedures:  ZIO MONITOR FOR 28 DAYS - You will wear 2 monitors for 14 days each. The second will be mailed to your house.  Your physician has recommended that you wear a Zio monitor. This monitor is a medical device that records the heart's electrical activity. Doctors most often use these monitors to diagnose arrhythmias. Arrhythmias are problems with the speed or rhythm of the heartbeat. The monitor is a small device applied to your chest. You can wear one while you do your normal daily activities. While wearing this monitor if you have any symptoms to push the button and record what you felt. Once you have worn this monitor for the period of time provider prescribed (Usually 14 days), you will return the monitor device in the postage paid box. Once it is returned they will download the data collected and provide [place] with a report which the provider will then review and we will call you with those results. Important tips:  1. Avoid showering during the first 24 hours of wearing the monitor. 2. Avoid excessive sweating to help maximize wear time. 3. Do not submerge the device, no hot tubs, and no swimming pools. 4. Keep any lotions or oils away from the patch. 5. After 24 hours you may shower with the patch on. Take brief showers with your back facing the shower head.  6. Do not remove patch  once it has been placed because that will interrupt data and decrease adhesive wear time. 7. Push the button when you have any symptoms and write down what you were feeling. 8. Once you have completed wearing your monitor, remove and place into box which has postage paid and place in your outgoing mailbox.  9. If for some reason you have misplaced your box then call our office and we can provide another box and/or mail it off for you.   Follow-Up: At [facility], you and your health needs are our priority.  As part of our continuing mission to provide you with exceptional heart care, we have created designated Provider Care Teams.  These Care Teams include your primary Cardiologist (physician) and Advanced Practice Providers (APPs -  Physician Assistants and Nurse Practitioners) who all work together to provide you with the care you need, when you need it.  We recommend signing up for the patient portal called "MyChart".  Sign up information is provided on this After Visit Summary.  MyChart is used to connect with patients for Virtual Visits (Telemedicine).  Patients are able to view lab/test results, encounter notes, upcoming appointments, etc.  Non-urgent messages can be sent to your provider as well.   To learn more about what you can do with MyChart, go to [email or web address].    Your next appointment:   6 week(s)  The format for your next appointment:   In Person  Provider:  You may see DR Harrell Gave END or one of the following Advanced Practice Providers on your designated Care Team:    Murray Hodgkins, NP  Christell Faith, PA-C  Marrianne Mood, PA-C  Cadence Conway, [place]

## 2020-06-19 NOTE — Progress Notes (Signed)
Follow-up Outpatient Visit Date: 06/19/2020  Primary Care Provider: Theotis Burrow, [place] [id number]  Chief Complaint: Follow-up heart failure and stroke  HPI:  Stacey Richardson is a 54 y.o. female with history of recent CVA, newly diagnosed HFrEF, hypertension, hyperlipidemia, type 2 diabetes mellitus, who presents for follow-up of HFrEF and stroke.  She was hospitalized earlier this month with acute speech difficulty and dysphagia.  She was diagnosed with a left frontal lobe infarct.  As part of her stroke work-up, echocardiogram revealed reduced LVEF of 35-40%.  Stacey Richardson was asymptomatic from a heart standpoint.  She was started on evidence-based heart failure therapy with carvedilol and losartan.  She was also discharged on secondary stroke prevention with aspirin, clopidogrel, and rosuvastatin.  Today, Stacey Richardson reports that she continues to struggle with her recent diagnoses and is nervous about whether she can return to work.  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  For the last several months, she reports shortness of breath with prolonged walking, which she is more aware of following her stroke last week.  She felt as though she was breathing heavily walking from her car to the office today.  She previously attributed that to deconditioning.  Stacey Richardson also reports occasional episodes during which she feels as though she cannot get enough air into her chest.  She denies frank chest pain, palpitations, lightheadedness, orthopnea, PND edema, and new neurologic deficits.  She is tolerating her recently started medications well.  --------------------------------------------------------------------------------------------------  Cardiovascular History & Procedures: Cardiovascular Problems:  Cardiomyopathy  Stroke  Risk Factors:  Stroke, hypertension, hyperlipidemia, diabetes mellitus,  and obesity  Cath/PCI:  None  CV Surgery:  None  EP Procedures and Devices:  None  Non-Invasive Evaluation(s):  TTE (06/13/2020): Mildly dilated left ventricle with mild LVH and LVEF of 35-40%.  Normal RV size and function.  Mild to moderate mitral regurgitation.  Recent CV Pertinent Labs: Lab Results  Component Value Date   CHOL 293 (H) 06/13/2020   HDL 60 06/13/2020   LDLCALC 207 (H) 06/13/2020   TRIG 128 06/13/2020   CHOLHDL 4.9 06/13/2020   INR 0.9 06/12/2020   BNP 150.0 (H) 01/22/2019   K 3.4 (L) 06/12/2020   MG 1.4 (L) 06/12/2020   BUN 10 06/12/2020   CREATININE 0.74 06/12/2020    Past medical and surgical history were reviewed and updated in EPIC.  Current Meds  Medication Sig  . aspirin EC 81 MG tablet Take 81 mg by mouth daily.   . carvedilol (COREG) 3.125 MG tablet Take 1 tablet (3.125 mg total) by mouth 2 (two) times daily with a meal.  . clopidogrel (PLAVIX) 75 MG tablet Take 1 tablet (75 mg total) by mouth daily.  . ferrous sulfate 325 (65 FE) MG tablet Take 325 mg by mouth 3 (three) times daily with meals.   Marland Kitchen losartan (COZAAR) 25 MG tablet Take 1 tablet (25 mg total) by mouth daily.  . metFORMIN (GLUCOPHAGE) 500 MG tablet Take 1,000 mg by mouth 2 (two) times daily with a meal.   . Multiple Vitamin (MULTIVITAMIN) tablet Take 1 tablet by mouth daily.  . rosuvastatin (CRESTOR) 40 MG tablet Take 1 tablet (40 mg total) by mouth daily.    Allergies: Lisinopril  Social History   Tobacco Use  . Smoking status: Former Research scientist (life sciences)  . Smokeless tobacco: Never Used  Substance Use Topics  . Alcohol  use: Yes  . Drug use: No    Family History  Problem Relation Age of Onset  . Heart disease Mother        a. ?valve    Review of Systems: A 12-system review of systems was performed and was negative except as noted in the HPI.  --------------------------------------------------------------------------------------------------  Physical Exam: BP (!) 142/94    Pulse 99   Ht 5\' 5"  (1.651 m)   Wt 199 lb (90.3 kg)   BMI 33.12 kg/m   General: NAD. HEENT: No conjunctival pallor or scleral icterus. Facemask in place. Neck: Supple without lymphadenopathy, thyromegaly, JVD, or HJR. Lungs: Normal work of breathing. Clear to auscultation bilaterally without wheezes or crackles. Heart: Regular rate and rhythm without murmurs, rubs, or gallops. Abd: Bowel sounds present. Soft, NT/ND. Ext: No lower extremity edema. Radial, PT, and DP pulses are 2+ bilaterally.  EKG: Normal sinus rhythm with left atrial enlargement and poor R wave progression.  Compared with prior tracing from 06/12/2020, heart rate has decreased.  PVCs are no longer present.  Lab Results  Component Value Date   WBC 9.1 06/12/2020   HGB 12.6 06/12/2020   HCT 38.4 06/12/2020   MCV 85.1 06/12/2020   PLT 404 (H) 06/12/2020    Lab Results  Component Value Date   NA 134 (L) 06/12/2020   K 3.4 (L) 06/12/2020   CL 100 06/12/2020   CO2 21 (L) 06/12/2020   BUN 10 06/12/2020   CREATININE 0.74 06/12/2020   GLUCOSE 248 (H) 06/12/2020   ALT 13 06/12/2020    Lab Results  Component Value Date   CHOL 293 (H) 06/13/2020   HDL 60 06/13/2020   LDLCALC 207 (H) 06/13/2020   TRIG 128 06/13/2020   CHOLHDL 4.9 06/13/2020    --------------------------------------------------------------------------------------------------  ASSESSMENT AND PLAN: HFrEF and cardiomyopathy: Stacey Richardson reports exertional dyspnea with modest activity such as walking across the parking lot, consistent with NYHA class III heart failure.  She appears euvolemic on examination today and I suspect some degree of deconditioning is contributing to her symptoms.  She is tolerating carvedilol and losartan well.  Given mildly elevated blood pressure today as well as upper normal heart rate, we have agreed to increase carvedilol to 6.25 mg twice daily.  We will plan to check a basic metabolic panel at in about a week at the  patient's convenience to ensure stable renal function.  Once she has recovered from her acute stroke, we will need to proceed with ischemia testing.  I would favor cardiac CTA, though we may need to consider catheterization if we are unable to lower her heart rate enough to facilitate CTA.  Stroke: Unifocal left frontal stroke led to recent hospitalization.  Clear etiology was not identified during hospital stay.  EKG today shows sinus rhythm.  We have discussed work-up of cryptogenic stroke and will begin with back to back 14-day event monitors to exclude atrial fibrillation.  If this is unrevealing, we will need to consider TEE.  In the meantime, Stacey Richardson should continue aspirin and clopidogrel under the direction of neurology for secondary prevention, as well as blood pressure control and statin therapy.  Hypertension: Blood pressure mildly elevated today (goal < 130/80).  We will increase carvedilol as outlined above and continue with current dose of losartan.  Hyperlipidemia: Continue rosuvastatin 40 mg daily for target LDL less than 70.  Follow-up: Return to clinic in 6 weeks.  [name], MD 06/19/2020 3:09 PM

## 2020-06-20 ENCOUNTER — Other Ambulatory Visit: Payer: Self-pay | Admitting: Family Medicine

## 2020-06-20 ENCOUNTER — Encounter: Payer: Self-pay | Admitting: Internal Medicine

## 2020-06-20 DIAGNOSIS — Z1231 Encounter for screening mammogram for malignant neoplasm of breast: Secondary | ICD-10-CM

## 2020-06-20 DIAGNOSIS — I5020 Unspecified systolic (congestive) heart failure: Secondary | ICD-10-CM | POA: Insufficient documentation

## 2020-06-20 DIAGNOSIS — I429 Cardiomyopathy, unspecified: Secondary | ICD-10-CM | POA: Insufficient documentation

## 2020-06-20 DIAGNOSIS — E785 Hyperlipidemia, unspecified: Secondary | ICD-10-CM | POA: Insufficient documentation

## 2020-06-21 ENCOUNTER — Telehealth: Payer: Self-pay | Admitting: *Deleted

## 2020-06-21 DIAGNOSIS — Z79899 Other long term (current) drug therapy: Secondary | ICD-10-CM

## 2020-06-21 DIAGNOSIS — I10 Essential (primary) hypertension: Secondary | ICD-10-CM

## 2020-06-21 NOTE — Telephone Encounter (Signed)
-----   Message from [name], MD sent at 06/20/2020  7:34 PM EDT ----- Hi Carolann Brazell,I forgot to ask if we can get a BMP in about 1 week for Ms. Boykin Reaper.  Would you mind reaching out to see if she can accommodate this, given recent addition of losartan?  Thanks.Gerald Stabs

## 2020-06-21 NOTE — Telephone Encounter (Signed)
No answer. Left message to call back.  Lab order entered.

## 2020-06-21 NOTE — Telephone Encounter (Signed)
Patient calling back and verbalized understanding to get lab work sometime the first of next week at the [facility].

## 2020-06-29 ENCOUNTER — Other Ambulatory Visit: Payer: Self-pay

## 2020-06-29 ENCOUNTER — Telehealth: Payer: Self-pay | Admitting: Internal Medicine

## 2020-06-29 ENCOUNTER — Other Ambulatory Visit
Admission: RE | Admit: 2020-06-29 | Discharge: 2020-06-29 | Disposition: A | Discharge status: Home / Self Care | Payer: 59 | Attending: Internal Medicine | Admitting: Internal Medicine

## 2020-06-29 DIAGNOSIS — I10 Essential (primary) hypertension: Secondary | ICD-10-CM | POA: Diagnosis present

## 2020-06-29 DIAGNOSIS — Z79899 Other long term (current) drug therapy: Secondary | ICD-10-CM | POA: Insufficient documentation

## 2020-06-29 LAB — BASIC METABOLIC PANEL
Anion gap: 9 (ref 5–15)
BUN: 8 mg/dL (ref 6–20)
CO2: 24 mmol/L (ref 22–32)
Calcium: 8.9 mg/dL (ref 8.9–10.3)
Chloride: 104 mmol/L (ref 98–111)
Creatinine, Ser: 0.63 mg/dL (ref 0.44–1.00)
GFR, Estimated: >60 mL/min (ref >60)
Glucose, Bld: 127 mg/dL — ABNORMAL HIGH (ref 70–99)
Potassium: 4.2 mmol/L (ref 3.5–5.1)
Sodium: 137 mmol/L (ref 135–145)

## 2020-06-29 NOTE — Telephone Encounter (Signed)
Pt c/o Shortness Of Breath: STAT if SOB developed within the last 24 hours or pt is noticeably SOB on the phone  1. Are you currently SOB (can you hear that pt is SOB on the phone)? Yes, in the office  2. How long have you been experiencing SOB? About 1 week  3. Are you SOB when sitting or when up moving around? Mos of the time  4. Are you currently experiencing any other symptoms? 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 Patient states Dr. Saunders Revel told her to reach out if she had any concerns or issues relating to work and he would help to maybe take her out of work. Patient is hoping she can be written out of work for two weeks if at all possible  Please advise

## 2020-06-29 NOTE — Telephone Encounter (Signed)
Called patient back. She is still having residual symptoms that are affecting her ability to do activities of daily living. Tonight at 7 pm is supposed to be her first day back to work since hospitalization and she does not feel she will be able to perform her duties. She is dizzy at time with vision blurry. She recalls she did not have this prior to the "mini stroke." BP has been running in the 140's/ 70-80's, HR in the 80-90's. Experiences shortness of breath with exertion.  At last office visit with Dr End, they discussed patient's return to work date.  Dr End asked if she needed more time and at that time patient felt she would be able to return to work. Now patient feels she is not able to yet. She would like at least 2 more weeks to evaluation. She sees Neurology next week.  APP has opening this afternoon that I mentioned may be good for reevaluation. However, patient seems stable at this time. Routing to Dr End to advise.

## 2020-06-29 NOTE — Telephone Encounter (Signed)
Please move up Ms. Streety's f/u appointment with me or an APP to 2 weeks from now.  She should stay out of work until then; we can draft a letter with this recommendation.  In regard to her double vision, she should seek immediate medical attention if it worsens at all or she develops other neurologic deficits.  She should f/u as scheduled with her neurologist for ongoing management of her stroke.  [name], MD [facility]

## 2020-06-30 ENCOUNTER — Telehealth: Payer: Self-pay | Admitting: Internal Medicine

## 2020-06-30 NOTE — Telephone Encounter (Signed)
Patient states she needs a work note for her to be out another 2 weeks, states she is still experiencing some shortness of breath.

## 2020-06-30 NOTE — Telephone Encounter (Signed)
Spoke with the patient. Patient made aware of Dr. Darnelle Bos response and recommendation. Patients appt with CW moved up to 07/12/20. Work letter drafted and has been left at the front desk for the patient to be picked up.  Patient sts that she is scheduled to see her neurologist next week. Her blurry vision has been stable and is improce when she wears her glasses. Patient voiced appreciation for the assistance.   [name], MD to Annia Belt, RN     06/29/20 7:09 PM Note Please move up Ms. Kanan's f/u appointment with me or an APP to 2 weeks from now.  She should stay out of work until then; we can draft a letter with this recommendation.  In regard to her double vision, she should seek immediate medical attention if it worsens at all or she develops other neurologic deficits.  She should f/u as scheduled with her neurologist for ongoing management of her stroke.  [name], MD [facility]

## 2020-07-03 NOTE — Telephone Encounter (Signed)
Patient calling back. She verbalized understanding of the recommendations from Dr End. She is going to the neurologist tomorrow. Appointment was rescheduled. She mentioned she is still feeling dizzy and wonders if its from the medications. Advised her to discuss with neurologist tomorrow and call us if any other questions or concerns arise.

## 2020-07-03 NOTE — Telephone Encounter (Signed)
No answer. Left message to call back.   

## 2020-07-05 ENCOUNTER — Telehealth: Payer: Self-pay | Admitting: Internal Medicine

## 2020-07-05 DIAGNOSIS — Z8673 Personal history of transient ischemic attack (TIA), and cerebral infarction without residual deficits: Secondary | ICD-10-CM | POA: Insufficient documentation

## 2020-07-05 NOTE — Telephone Encounter (Signed)
Received FMLA forms from [employer] [facility] patient about signing release and fee Placed in binder

## 2020-07-06 DIAGNOSIS — Z0279 Encounter for issue of other medical certificate: Secondary | ICD-10-CM

## 2020-07-06 NOTE — Telephone Encounter (Signed)
FMLA forms have been received, patient payment has been received and ROI has been signed. FMLA forms placed in nurse box

## 2020-07-07 NOTE — Telephone Encounter (Signed)
Forms received and placed in Dr Darnelle Bos office basket to review and complete when in office again.

## 2020-07-11 ENCOUNTER — Telehealth: Payer: Self-pay | Admitting: Internal Medicine

## 2020-07-11 ENCOUNTER — Emergency Department: Payer: 59

## 2020-07-11 ENCOUNTER — Encounter: Payer: Self-pay | Admitting: *Deleted

## 2020-07-11 ENCOUNTER — Other Ambulatory Visit: Payer: Self-pay

## 2020-07-11 DIAGNOSIS — Z7982 Long term (current) use of aspirin: Secondary | ICD-10-CM | POA: Diagnosis not present

## 2020-07-11 DIAGNOSIS — I110 Hypertensive heart disease with heart failure: Secondary | ICD-10-CM | POA: Insufficient documentation

## 2020-07-11 DIAGNOSIS — I5089 Other heart failure: Secondary | ICD-10-CM | POA: Diagnosis not present

## 2020-07-11 DIAGNOSIS — E119 Type 2 diabetes mellitus without complications: Secondary | ICD-10-CM | POA: Diagnosis not present

## 2020-07-11 DIAGNOSIS — Z7902 Long term (current) use of antithrombotics/antiplatelets: Secondary | ICD-10-CM | POA: Insufficient documentation

## 2020-07-11 DIAGNOSIS — Z8673 Personal history of transient ischemic attack (TIA), and cerebral infarction without residual deficits: Secondary | ICD-10-CM | POA: Insufficient documentation

## 2020-07-11 DIAGNOSIS — Z7984 Long term (current) use of oral hypoglycemic drugs: Secondary | ICD-10-CM | POA: Diagnosis not present

## 2020-07-11 DIAGNOSIS — Z87891 Personal history of nicotine dependence: Secondary | ICD-10-CM | POA: Diagnosis not present

## 2020-07-11 DIAGNOSIS — R42 Dizziness and giddiness: Secondary | ICD-10-CM | POA: Diagnosis present

## 2020-07-11 LAB — BASIC METABOLIC PANEL
Anion gap: 12 (ref 5–15)
BUN: 11 mg/dL (ref 6–20)
CO2: 25 mmol/L (ref 22–32)
Calcium: 9.6 mg/dL (ref 8.9–10.3)
Chloride: 102 mmol/L (ref 98–111)
Creatinine, Ser: 0.57 mg/dL (ref 0.44–1.00)
GFR, Estimated: >60 mL/min (ref >60)
Glucose, Bld: 194 mg/dL — ABNORMAL HIGH (ref 70–99)
Potassium: 3.8 mmol/L (ref 3.5–5.1)
Sodium: 139 mmol/L (ref 135–145)

## 2020-07-11 LAB — CBC
HCT: 37.6 % (ref 36.0–46.0)
Hemoglobin: 12.3 g/dL (ref 12.0–15.0)
MCH: 28.2 pg (ref 26.0–34.0)
MCHC: 32.7 g/dL (ref 30.0–36.0)
MCV: 86.2 fL (ref 80.0–100.0)
Platelets: 371 10*3/uL (ref 150–400)
RBC: 4.36 MIL/uL (ref 3.87–5.11)
RDW: 13.2 % (ref 11.5–15.5)
WBC: 7.8 10*3/uL (ref 4.0–10.5)
nRBC: 0 % (ref 0.0–0.2)

## 2020-07-11 LAB — TROPONIN I (HIGH SENSITIVITY)
Troponin I (High Sensitivity): 4 ng/L (ref <18)
Troponin I (High Sensitivity): 4 ng/L (ref <18)

## 2020-07-11 NOTE — Telephone Encounter (Signed)
Pt c/o medication issue:  1. Name of Medication: losartan 25MG  - carvedilol 6.25 MG - clopidogrel 75 MG - rosuvastatin 40 MG   2. How are you currently taking this medication (dosage and times per day)?   3. Are you having a reaction (difficulty breathing--STAT)? Dizzy   4. What is your medication issue? Patient calling, states all these medications are making her dizzy, she feels like she may pass out but it comes and goes.  Would like to know if she should go to ER.  Has not been able to check HR or BP.  Please call to discuss.

## 2020-07-11 NOTE — Telephone Encounter (Signed)
Received completed forms from Dr End and faxed to [facility] Informed patient of completion - will be in office to pick up copy tomorrow

## 2020-07-11 NOTE — ED Triage Notes (Signed)
Pt to triage via wheelchair.  Pt has dizziness for 3-4 days.  Pt had a stroke 2 weeks ago and started on new meds by dr end.  Pt now having dizziness and states I feel like i'm going to pass out.  Pt alert  Speech clear. New meds are crestor,plavix,cozaar,coreg.

## 2020-07-12 ENCOUNTER — Emergency Department
Admission: EM | Admit: 2020-07-12 | Discharge: 2020-07-12 | Disposition: A | Discharge status: Home / Self Care | Payer: 59 | Attending: Emergency Medicine | Admitting: Emergency Medicine

## 2020-07-12 ENCOUNTER — Emergency Department: Payer: 59

## 2020-07-12 ENCOUNTER — Encounter: Payer: Self-pay | Admitting: Family

## 2020-07-12 ENCOUNTER — Ambulatory Visit: Payer: 59 | Admitting: Family

## 2020-07-12 VITALS — BP 148/76 | HR 98 | Ht 65.0 in | Wt 197.0 lb

## 2020-07-12 DIAGNOSIS — I441 Atrioventricular block, second degree: Secondary | ICD-10-CM

## 2020-07-12 DIAGNOSIS — R42 Dizziness and giddiness: Secondary | ICD-10-CM

## 2020-07-12 DIAGNOSIS — I10 Essential (primary) hypertension: Secondary | ICD-10-CM

## 2020-07-12 DIAGNOSIS — R55 Syncope and collapse: Secondary | ICD-10-CM

## 2020-07-12 DIAGNOSIS — E785 Hyperlipidemia, unspecified: Secondary | ICD-10-CM

## 2020-07-12 DIAGNOSIS — I429 Cardiomyopathy, unspecified: Secondary | ICD-10-CM

## 2020-07-12 DIAGNOSIS — E1165 Type 2 diabetes mellitus with hyperglycemia: Secondary | ICD-10-CM

## 2020-07-12 DIAGNOSIS — I5020 Unspecified systolic (congestive) heart failure: Secondary | ICD-10-CM

## 2020-07-12 DIAGNOSIS — Z8673 Personal history of transient ischemic attack (TIA), and cerebral infarction without residual deficits: Secondary | ICD-10-CM

## 2020-07-12 MED ORDER — LOSARTAN POTASSIUM 50 MG PO TABS: 50.0000 mg | ORAL_TABLET | Freq: Every day | ORAL | 0 refills | Status: DC

## 2020-07-12 MED ORDER — LORAZEPAM 2 MG/ML IJ SOLN
0.5000 mg | Freq: Once | INTRAMUSCULAR | Status: AC
  Administered 2020-07-12: 0.5 mg via INTRAVENOUS
  Filled 2020-07-12: qty 1

## 2020-07-12 MED ORDER — LORAZEPAM 1 MG PO TABS: 1.0000 mg | ORAL_TABLET | Freq: Once | ORAL | Status: DC

## 2020-07-12 NOTE — H&P (View-Only) (Signed)
Office Visit    Patient Name: Stacey Richardson Date of Encounter: 07/12/2020  Primary Care Provider:  Theotis Burrow, MD Primary Cardiologist:  No primary care provider on file. Electrophysiologist:  None   Chief Complaint    Stacey Richardson is a 54 y.o. female with a hx of CVA, HFrEF, HTN, hyperlipidemia, DM2 presents today for lightheadedness and near syncope  Past Medical History    Past Medical History:  Diagnosis Date  . CVA (cerebral vascular accident) ([place])   . Diabetes mellitus   . HFrEF (heart failure with reduced ejection fraction) ([place])   . HLD (hyperlipidemia)   . Hypertension    Past Surgical History:  Procedure Laterality Date  . KNEE SURGERY      Allergies  Allergies  Allergen Reactions  . Lisinopril Cough    History of Present Illness    Stacey Richardson is a 54 y.o. female with a hx of CVA, HFrEF, HTN, HLD, DM 2 last seen 06/19/2020 by Dr. Saunders Revel.  Hospitalized 06/12/2020 with acute speech difficulty and dysphagia.  Diagnosed with a left frontal lobe infarct.  Echo cardiogram revealed reduced LVEF 35-40%.  She was asymptomatic from a heart standpoint.  She was started on carvedilol, losartan, aspirin, clopidogrel, rosuvastatin.  She was seen in clinic 06/19/2020 by Dr. Saunders Revel.  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  She noted multiple month history of shortness of breath with prolonged walking.  She reported NYHA class III symptoms.  As her blood pressure was mildly elevated with upper normal heart rate her carvedilol was increased to 6.25 mg twice daily.  She will require ischemia testing but was deferred until recovery from acute stroke.  A 14-day ZIO monitor was placed with recommended dictation for 2 back-to-back 14-day monitors.  She called the office 06/29/2020 noting shortness of breath as well as dizziness.  She  requested letter to remain out of work and this was provided. She called the office again 07/11/2020 noting that all of her medications are making her dizzy with sensation that she is about to pass out but it comes and goes.  She then presented to the ED for evaluation.  MRI with small subacute appearing white matter infarct in left corona radiata new compared to previous, in same vicinity as prior ischemia as well as tiny area of cortical encephalomalacia corresponding to left operculum infarct last month. Reviewed by neurology with notation of subacute stroke recommended for outpatient follow up and to continue aspirin.   Tells me her dizziness precipitates her stroke diagnosis. Tells me the dizziness is like a lightheaded and near-syncopal experience. Happens both at rest and with activity. It is more noticeable with movement.   She stopped drinking soda one week ago. She has an occasional glass of tea. Drinks one bottle of water per day. She tells me she has been eating out a lot. We discussed that inadequate fluid intake might precipitate lightheadedness. Discussed <2L fluid restriction. She is walking around the track for exercise and was able to do this a few days ago and felt well.   Her BP at home has been 140-145 on her arm cuff.   Endorses lots of stress associated with her recent diagnosis.  Questions whether or not she needs to be on long-term disability. We discussed need for additional testing and will continue with plan for  short term FMLA.   Reviewed preliminary ZIO report with single episode of Wenckebach which was triggered episode. No evidence of atrial fibrillation or significant pause. She does snore but generally wakes feeling well rested. We discussed possibility of future sleep study.   EKGs/Labs/Other Studies Reviewed:   The following studies were reviewed today: MRI 07-23-2020 IMPRESSION: 1. Small subacute appearing white matter infarct in the left corona radiata is new since  06/12/2020, in the same vicinity as the prior ischemia. No associated hemorrhage or mass effect.   2. Tiny area of cortical encephalomalacia corresponding to the left operculum infarct last month.   3. No other acute intracranial abnormality.  CT head 07/23/2020 IMPRESSION: Negative head CT. The small cortical infarct on recent brain MRI is too small for CT visualization.  MRI 07/23/20 IMPRESSION: 1. Small subacute appearing white matter infarct in the left corona radiata is new since 06/12/2020, in the same vicinity as the prior ischemia. No associated hemorrhage or mass effect.   2. Tiny area of cortical encephalomalacia corresponding to the left operculum infarct last month.   3. No other acute intracranial abnormality.  EKG:  EKG is ordered today.  The ekg ordered today demonstrates NSR 98 bpm with LVH and repolarization abnormality. No acute ST/T wave changes.   Recent Labs: 06/12/2020: ALT 13; Magnesium 1.4; TSH 1.251 07/11/2020: BUN 11; Creatinine, Ser 0.57; Hemoglobin 12.3; Platelets 371; Potassium 3.8; Sodium 139  Recent Lipid Panel    Component Value Date/Time   CHOL 293 (H) 06/13/2020 0318   TRIG 128 06/13/2020 0318   HDL 60 06/13/2020 0318   CHOLHDL 4.9 06/13/2020 0318   VLDL 26 06/13/2020 0318   LDLCALC 207 (H) 06/13/2020 0318   Home Medications   Current Meds  Medication Sig  . aspirin EC 81 MG tablet Take 81 mg by mouth daily.   . clopidogrel (PLAVIX) 75 MG tablet Take 1 tablet (75 mg total) by mouth daily.  . ferrous sulfate 325 (65 FE) MG tablet Take 325 mg by mouth 3 (three) times daily with meals.   . metFORMIN (GLUCOPHAGE) 500 MG tablet Take 1,000 mg by mouth 2 (two) times daily with a meal.   . Multiple Vitamin (MULTIVITAMIN) tablet Take 1 tablet by mouth daily.  . rosuvastatin (CRESTOR) 40 MG tablet Take 1 tablet (40 mg total) by mouth daily.  . [DISCONTINUED] carvedilol (COREG) 6.25 MG tablet Take 1 tablet (6.25 mg total) by mouth 2 (two) times daily.   . [DISCONTINUED] losartan (COZAAR) 25 MG tablet Take 1 tablet (25 mg total) by mouth daily.     Review of Systems     Review of Systems  Constitutional: Positive for malaise/fatigue. Negative for chills and fever.  Cardiovascular: Positive for dyspnea on exertion and near-syncope. Negative for chest pain, irregular heartbeat, leg swelling, orthopnea, palpitations and syncope.  Respiratory: Negative for cough, shortness of breath and wheezing.   Gastrointestinal: Negative for melena, nausea and vomiting.  Genitourinary: Negative for hematuria.  Neurological: Positive for dizziness and light-headedness. Negative for weakness.  Psychiatric/Behavioral: The patient is nervous/anxious.    All other systems reviewed and are otherwise negative except as noted above.  Physical Exam    VS:  BP (!) 148/76 (BP Location: Left Arm, Patient Position: Sitting, Cuff Size: Normal)   Pulse 98   Ht 5\' 5"  (1.651 m)   Wt 197 lb (89.4 kg)   LMP 06/27/2020 (Approximate)   SpO2 98%   BMI 32.78 kg/m  , BMI Body mass index is  32.78 kg/m.  Wt Readings from Last 3 Encounters:  07/12/20 197 lb (89.4 kg)  07/11/20 197 lb (89.4 kg)  06/19/20 199 lb (90.3 kg)    GEN: Well nourished, overweight, well developed, in no acute distress. HEENT: normal. Neck: Supple, no JVD, carotid bruits, or masses. Cardiac: RRR, no murmurs, rubs, or gallops. No clubbing, cyanosis, edema.  Radials/DP/PT 2+ and equal bilaterally.  Respiratory:  Respirations regular and unlabored, clear to auscultation bilaterally. GI: Soft, nontender, nondistended. MS: No deformity or atrophy. Skin: Warm and dry, no rash. Neuro:  Strength and sensation are intact. Psych: Normal affect. Anxious.   Assessment & Plan   1. HFrEF and cardiomyopathy- Echo 06/13/20 EF 35-40%, mild LVH, LV mildly dilated, mild to moderate MR in setting of CVA. Euvolemic and well compensated on exam. Long discussion regarding sodium restriction and <2L fluid per day.  GDMT includes Losartan. Carvedilol discontinued due to Wenckebach. No indication for loop diuretic at this time. Will defer escalation of GDMT at this time in order to proceed with stroke workup. Will need ischemia eval by cardiac CTA or cardiac cath once stroke workup complete to assess for ischemic etiology of reduced EF.   2. Stroke- Admitted 0/4/21 with right sided facial drooping and MRI showing small 63mm left frontal lobe infarct. Neurology recommended DAPTx3 weeks and cardiac event monitor. Initial 2 week ZIO monitor preliminary report with predominantly NSR though [place] 1 Wenckebach) was also noted. No evidence of atrial fib/flutter. She was wearing her second monitor though it was removed in ED today for MRI. She presented with dizziness, CT head unremarkable,  MRI with small subacute appearing white matter infarct in left corona radiata new compared to previous, in same vicinity as prior ischemia as well as tiny area of cortical encephalomalacia corresponding to left operculum infarct last month. Encouraged to schedule prompt follow up with neurology. Due to recurrent subacute stroke will plan for TEE to rule out PFO. Case discussed with Dr. Saunders Revel in clinic.  3. Wenckebach / Lightheadedness / Near Syncope - Noted on preliminary report of ZIO monitor associated with patient triggered event and HR 40. Stop Carvedilol. She wore her second monitor for 9 days and was removed for MRI today - she will mail back - will defer additional monitor until results received. Anticipate this is contributory to dizziness though does not explain every episode of dizziness. Will need to continue follow up with neurology and consideration of referral to ENT.  4. HTN- BP elevated. Stop Carvedilol due to Wenkebach. Increase Losartan to 50mg  daily. Continue to monitor BP at home with BP goal <130/80.   5. HLD- Continue Rosuvastatin 40mg  daily. LDL goal <70 due to history of CVA. 06/13/20 LDL 207. Will need repeat lipid  panel at next clinic visit. 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  6. DM2- Continue to follow with PCP  Due to need for further testing her FMLA paperwork was updated and faxed to indicate return to work date 07/31/20 as this will allow for her to complete her TEE as well as follow up appointment in clinic.   Disposition: TEE scheduled for 07/18/20. Follow up after TEE with Dr. Saunders Revel or APP  Signed, Loel Dubonnet, NP 07/12/2020, 4:52 PM [place]

## 2020-07-12 NOTE — Telephone Encounter (Signed)
Patient currently in ED.

## 2020-07-12 NOTE — Progress Notes (Signed)
Office Visit    Patient Name: Stacey Richardson Date of Encounter: 07/12/2020  Primary Care Provider:  Theotis Burrow, MD Primary Cardiologist:  No primary care provider on file. Electrophysiologist:  None   Chief Complaint    Stacey Richardson is a 54 y.o. female with a hx of CVA, HFrEF, HTN, hyperlipidemia, DM2 presents today for lightheadedness and near syncope  Past Medical History    Past Medical History:  Diagnosis Date  . CVA (cerebral vascular accident) ([place])   . Diabetes mellitus   . HFrEF (heart failure with reduced ejection fraction) ([place])   . HLD (hyperlipidemia)   . Hypertension    Past Surgical History:  Procedure Laterality Date  . KNEE SURGERY      Allergies  Allergies  Allergen Reactions  . Lisinopril Cough    History of Present Illness    Stacey Richardson is a 54 y.o. female with a hx of CVA, HFrEF, HTN, HLD, DM 2 last seen 06/19/2020 by Dr. Saunders Revel.  Hospitalized 06/12/2020 with acute speech difficulty and dysphagia.  Diagnosed with a left frontal lobe infarct.  Echo cardiogram revealed reduced LVEF 35-40%.  She was asymptomatic from a heart standpoint.  She was started on carvedilol, losartan, aspirin, clopidogrel, rosuvastatin.  She was seen in clinic 06/19/2020 by Dr. Saunders Revel.  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  She noted multiple month history of shortness of breath with prolonged walking.  She reported NYHA class III symptoms.  As her blood pressure was mildly elevated with upper normal heart rate her carvedilol was increased to 6.25 mg twice daily.  She will require ischemia testing but was deferred until recovery from acute stroke.  A 14-day ZIO monitor was placed with recommended dictation for 2 back-to-back 14-day monitors.  She called the office 06/29/2020 noting shortness of breath as well as dizziness.  She  requested letter to remain out of work and this was provided. She called the office again 07/11/2020 noting that all of her medications are making her dizzy with sensation that she is about to pass out but it comes and goes.  She then presented to the ED for evaluation.  MRI with small subacute appearing white matter infarct in left corona radiata new compared to previous, in same vicinity as prior ischemia as well as tiny area of cortical encephalomalacia corresponding to left operculum infarct last month. Reviewed by neurology with notation of subacute stroke recommended for outpatient follow up and to continue aspirin.   Tells me her dizziness precipitates her stroke diagnosis. Tells me the dizziness is like a lightheaded and near-syncopal experience. Happens both at rest and with activity. It is more noticeable with movement.   She stopped drinking soda one week ago. She has an occasional glass of tea. Drinks one bottle of water per day. She tells me she has been eating out a lot. We discussed that inadequate fluid intake might precipitate lightheadedness. Discussed <2L fluid restriction. She is walking around the track for exercise and was able to do this a few days ago and felt well.   Her BP at home has been 140-145 on her arm cuff.   Endorses lots of stress associated with her recent diagnosis.  Questions whether or not she needs to be on long-term disability. We discussed need for additional testing and will continue with plan for  short term FMLA.   Reviewed preliminary ZIO report with single episode of Wenckebach which was triggered episode. No evidence of atrial fibrillation or significant pause. She does snore but generally wakes feeling well rested. We discussed possibility of future sleep study.   EKGs/Labs/Other Studies Reviewed:   The following studies were reviewed today: MRI August 01, 2020 IMPRESSION: 1. Small subacute appearing white matter infarct in the left corona radiata is new since  06/12/2020, in the same vicinity as the prior ischemia. No associated hemorrhage or mass effect.   2. Tiny area of cortical encephalomalacia corresponding to the left operculum infarct last month.   3. No other acute intracranial abnormality.  CT head 2020-08-01 IMPRESSION: Negative head CT. The small cortical infarct on recent brain MRI is too small for CT visualization.  MRI 08/01/2020 IMPRESSION: 1. Small subacute appearing white matter infarct in the left corona radiata is new since 06/12/2020, in the same vicinity as the prior ischemia. No associated hemorrhage or mass effect.   2. Tiny area of cortical encephalomalacia corresponding to the left operculum infarct last month.   3. No other acute intracranial abnormality.  EKG:  EKG is ordered today.  The ekg ordered today demonstrates NSR 98 bpm with LVH and repolarization abnormality. No acute ST/T wave changes.   Recent Labs: 06/12/2020: ALT 13; Magnesium 1.4; TSH 1.251 07/11/2020: BUN 11; Creatinine, Ser 0.57; Hemoglobin 12.3; Platelets 371; Potassium 3.8; Sodium 139  Recent Lipid Panel    Component Value Date/Time   CHOL 293 (H) 06/13/2020 0318   TRIG 128 06/13/2020 0318   HDL 60 06/13/2020 0318   CHOLHDL 4.9 06/13/2020 0318   VLDL 26 06/13/2020 0318   LDLCALC 207 (H) 06/13/2020 0318   Home Medications   Current Meds  Medication Sig  . aspirin EC 81 MG tablet Take 81 mg by mouth daily.   . clopidogrel (PLAVIX) 75 MG tablet Take 1 tablet (75 mg total) by mouth daily.  . ferrous sulfate 325 (65 FE) MG tablet Take 325 mg by mouth 3 (three) times daily with meals.   . metFORMIN (GLUCOPHAGE) 500 MG tablet Take 1,000 mg by mouth 2 (two) times daily with a meal.   . Multiple Vitamin (MULTIVITAMIN) tablet Take 1 tablet by mouth daily.  . rosuvastatin (CRESTOR) 40 MG tablet Take 1 tablet (40 mg total) by mouth daily.  . [DISCONTINUED] carvedilol (COREG) 6.25 MG tablet Take 1 tablet (6.25 mg total) by mouth 2 (two) times daily.   . [DISCONTINUED] losartan (COZAAR) 25 MG tablet Take 1 tablet (25 mg total) by mouth daily.     Review of Systems     Review of Systems  Constitutional: Positive for malaise/fatigue. Negative for chills and fever.  Cardiovascular: Positive for dyspnea on exertion and near-syncope. Negative for chest pain, irregular heartbeat, leg swelling, orthopnea, palpitations and syncope.  Respiratory: Negative for cough, shortness of breath and wheezing.   Gastrointestinal: Negative for melena, nausea and vomiting.  Genitourinary: Negative for hematuria.  Neurological: Positive for dizziness and light-headedness. Negative for weakness.  Psychiatric/Behavioral: The patient is nervous/anxious.    All other systems reviewed and are otherwise negative except as noted above.  Physical Exam    VS:  BP (!) 148/76 (BP Location: Left Arm, Patient Position: Sitting, Cuff Size: Normal)   Pulse 98   Ht 5\' 5"  (1.651 m)   Wt 197 lb (89.4 kg)   LMP 06/27/2020 (Approximate)   SpO2 98%   BMI 32.78 kg/m  , BMI Body mass index is  32.78 kg/m.  Wt Readings from Last 3 Encounters:  07/12/20 197 lb (89.4 kg)  07/11/20 197 lb (89.4 kg)  06/19/20 199 lb (90.3 kg)    GEN: Well nourished, overweight, well developed, in no acute distress. HEENT: normal. Neck: Supple, no JVD, carotid bruits, or masses. Cardiac: RRR, no murmurs, rubs, or gallops. No clubbing, cyanosis, edema.  Radials/DP/PT 2+ and equal bilaterally.  Respiratory:  Respirations regular and unlabored, clear to auscultation bilaterally. GI: Soft, nontender, nondistended. MS: No deformity or atrophy. Skin: Warm and dry, no rash. Neuro:  Strength and sensation are intact. Psych: Normal affect. Anxious.   Assessment & Plan   1. HFrEF and cardiomyopathy- Echo 06/13/20 EF 35-40%, mild LVH, LV mildly dilated, mild to moderate MR in setting of CVA. Euvolemic and well compensated on exam. Long discussion regarding sodium restriction and <2L fluid per day.  GDMT includes Losartan. Carvedilol discontinued due to Wenckebach. No indication for loop diuretic at this time. Will defer escalation of GDMT at this time in order to proceed with stroke workup. Will need ischemia eval by cardiac CTA or cardiac cath once stroke workup complete to assess for ischemic etiology of reduced EF.   2. Stroke- Admitted 0/4/21 with right sided facial drooping and MRI showing small 58mm left frontal lobe infarct. Neurology recommended DAPTx3 weeks and cardiac event monitor. Initial 2 week ZIO monitor preliminary report with predominantly NSR though [place] 1 Wenckebach) was also noted. No evidence of atrial fib/flutter. She was wearing her second monitor though it was removed in ED today for MRI. She presented with dizziness, CT head unremarkable,  MRI with small subacute appearing white matter infarct in left corona radiata new compared to previous, in same vicinity as prior ischemia as well as tiny area of cortical encephalomalacia corresponding to left operculum infarct last month. Encouraged to schedule prompt follow up with neurology. Due to recurrent subacute stroke will plan for TEE to rule out PFO. Case discussed with Dr. Saunders Revel in clinic.  3. Wenckebach / Lightheadedness / Near Syncope - Noted on preliminary report of ZIO monitor associated with patient triggered event and HR 40. Stop Carvedilol. She wore her second monitor for 9 days and was removed for MRI today - she will mail back - will defer additional monitor until results received. Anticipate this is contributory to dizziness though does not explain every episode of dizziness. Will need to continue follow up with neurology and consideration of referral to ENT.  4. HTN- BP elevated. Stop Carvedilol due to Wenkebach. Increase Losartan to 50mg  daily. Continue to monitor BP at home with BP goal <130/80.   5. HLD- Continue Rosuvastatin 40mg  daily. LDL goal <70 due to history of CVA. 06/13/20 LDL 207. Will need repeat lipid  panel at next clinic visit. 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  6. DM2- Continue to follow with PCP  Due to need for further testing her FMLA paperwork was updated and faxed to indicate return to work date 07/31/20 as this will allow for her to complete her TEE as well as follow up appointment in clinic.   Disposition: TEE scheduled for 07/18/20. Follow up after TEE with Dr. Saunders Revel or APP  Signed, Loel Dubonnet, NP 07/12/2020, 4:52 PM [place]

## 2020-07-12 NOTE — Telephone Encounter (Signed)
Patient added to APP schedule today.

## 2020-07-12 NOTE — ED Notes (Signed)
Patient transported to CT 

## 2020-07-12 NOTE — ED Notes (Signed)
EDP in room, pt c/o dizziness, had CVA in Oct 2021, pt reports dizziness has worsened over the past week. Speech clear, pt denies any balance issues.

## 2020-07-12 NOTE — ED Notes (Signed)
Returned from CT.

## 2020-07-12 NOTE — Patient Instructions (Addendum)
Medication Instructions:  DISCONTINUE carvedilol. INCREASE losartan to 50mg  daily.  *If you need a refill on your cardiac medications before your next appointment, please call your pharmacy*   Lab Work: None today.  If you have labs (blood work) drawn today and your tests are completely normal, you will receive your results only by: Marland Kitchen MyChart Message (if you have MyChart) OR . A paper copy in the mail If you have any lab test that is abnormal or we need to change your treatment, we will call you to review the results.   Testing/Procedures: You are scheduled for a TEE on 07/18/2020 at 7:30 AM with Dr. Saunders Revel. Please arrive at the [facility] at 6:30 AM a.m. on the day of your procedure.  DIET INSTRUCTIONS:  Nothing to eat or drink after midnight except your medications with a sip of water.       1) Labs: COVID testing 07/17/2020 at [place] thru only  2) Medications:  YOU MAY TAKE ALL of your remaining medications with a small amount of water.  3) Must have a responsible person to drive you home.  4) Bring a current list of your medications and current insurance cards.    If you have any questions after you get home, please call the office at [phone]   Follow-Up: At [facility], you and your health needs are our priority.  As part of our continuing mission to provide you with exceptional heart care, we have created designated Provider Care Teams.  These Care Teams include your primary Cardiologist (physician) and Advanced Practice Providers (APPs -  Physician Assistants and Nurse Practitioners) who all work together to provide you with the care you need, when you need it.  We recommend signing up for the patient portal called "MyChart".  Sign up information is provided on this After Visit Summary.  MyChart is used to connect with patients for Virtual Visits (Telemedicine).  Patients are able to view lab/test results, encounter notes, upcoming  appointments, etc.  Non-urgent messages can be sent to your provider as well.   To learn more about what you can do with MyChart, go to [email or web address].    Your next appointment:   Follow-up with Dr. Saunders Revel after TEE.  The format for your next appointment:   In Person  Provider:   You may see Dr. Harrell Gave End or one of the following Advanced Practice Providers on your designated Care Team:    Murray Hodgkins, NP  Christell Faith, PA-C  Marrianne Mood, PA-C  Cadence Kathlen Mody, [place]    Other Instructions  Recommend eating a low salt diet.   Recommend drinking less than 2 liters (64 ounces) of fluid per day.

## 2020-07-12 NOTE — ED Notes (Signed)
MRI called, informed patient has ZIO XT cardiac monitor on that is not compatible with MRI. Dr. Owens Shark also notified.

## 2020-07-12 NOTE — ED Notes (Signed)
ZIO XT cardiac monitor removed by Dr. Owens Shark to proceed with MRI.

## 2020-07-12 NOTE — ED Provider Notes (Signed)
[facility] Emergency Department Provider Note  ____________________________________________   First MD Initiated Contact with Patient 07/12/20 0408     (approximate)  I have reviewed the triage vital signs and the nursing notes.   HISTORY  Chief Complaint Dizziness    HPI Stacey Richardson is a 54 y.o. female with below list of previous medical conditions including hypertension diabetes heart failure hyperlipidemia and recent CVA on 06/12/2020 presents to the emergency department secondary to dizziness over the last 3 to 4 days.  Patient denies any weakness numbness gait instability or visual changes.  Patient states that when the dizziness occurs that she feels like "she is going to pass out".  Patient states that these episodes of dizziness preceded her stroke and is currently wearing a Holter monitor for which she is to follow-up with Dr. Saunders Revel cardiologist today.  Patient denies any chest pain or shortness of breath.  No lower extremity pain or swelling.       Past Medical History:  Diagnosis Date  . CVA (cerebral vascular accident) ([place])   . Diabetes mellitus   . HFrEF (heart failure with reduced ejection fraction) ([place])   . HLD (hyperlipidemia)   . Hypertension     Patient Active Problem List   Diagnosis Date Noted  . HFrEF (heart failure with reduced ejection fraction) ([place]) 06/20/2020  . Cardiomyopathy ([place]) 06/20/2020  . Hyperlipidemia LDL goal <70 06/20/2020  . Hyperglycemia   . Cerebrovascular accident (CVA) ([place])   . TIA (transient ischemic attack) 06/12/2020  . Multifocal pneumonia 01/22/2019  . Type 2 diabetes mellitus without complication ([place])   . Essential hypertension     Past Surgical History:  Procedure Laterality Date  . KNEE SURGERY      Prior to Admission medications   Medication Sig Start Date End Date Taking? Authorizing Provider  aspirin EC 81 MG tablet Take 81 mg by mouth daily.     [provider]   carvedilol (COREG) 6.25 MG tablet Take 1 tablet (6.25 mg total) by mouth 2 (two) times daily. 06/19/20   End, Harrell Gave, MD  clopidogrel (PLAVIX) 75 MG tablet Take 1 tablet (75 mg total) by mouth daily. 06/14/20   Lorella Nimrod, MD  ferrous sulfate 325 (65 FE) MG tablet Take 325 mg by mouth 3 (three) times daily with meals.     [provider]  losartan (COZAAR) 25 MG tablet Take 1 tablet (25 mg total) by mouth daily. 06/13/20   Lorella Nimrod, MD  metFORMIN (GLUCOPHAGE) 500 MG tablet Take 1,000 mg by mouth 2 (two) times daily with a meal.  09/08/13   [provider]  Multiple Vitamin (MULTIVITAMIN) tablet Take 1 tablet by mouth daily.    [provider]  rosuvastatin (CRESTOR) 40 MG tablet Take 1 tablet (40 mg total) by mouth daily. 06/14/20   Lorella Nimrod, MD    Allergies Lisinopril  Family History  Problem Relation Age of Onset  . Heart disease Mother        a. ?valve    Social History Social History   Tobacco Use  . Smoking status: Former Research scientist (life sciences)  . Smokeless tobacco: Never Used  Substance Use Topics  . Alcohol use: Yes  . Drug use: No    Review of Systems Constitutional: No fever/chills Eyes: No visual changes. ENT: No sore throat. Cardiovascular: Denies chest pain. Respiratory: Denies shortness of breath. Gastrointestinal: No abdominal pain.  No nausea, no vomiting.  No diarrhea.  No constipation. Genitourinary: Negative  for dysuria. Musculoskeletal: Negative for neck pain.  Negative for back pain. Integumentary: Negative for rash. Neurological: Positive for dizziness.  Negative for headaches, focal weakness or numbness.   ____________________________________________   PHYSICAL EXAM:  VITAL SIGNS: ED Triage Vitals  Enc Vitals Group     BP 07/11/20 1745 135/74     Pulse Rate 07/11/20 1745 82     Resp 07/11/20 1745 18     Temp 07/11/20 1745 98.9 F (37.2 C)     Temp Source 07/11/20 1745 Oral     SpO2 07/11/20 1745 100 %      Weight 07/11/20 1746 89.4 kg (197 lb)     Height 07/11/20 1746 1.651 m (5\' 5" )     Head Circumference --      Peak Flow --      Pain Score 07/11/20 1745 0     Pain Loc --      Pain Edu? --      Excl. in [place]? --     Constitutional: Alert and oriented.  Eyes: Conjunctivae are normal.  Head: Atraumatic. Mouth/Throat: Patient is wearing a mask. Neck: No stridor.  No meningeal signs.   Cardiovascular: Normal rate, regular rhythm. Good peripheral circulation. Grossly normal heart sounds. Respiratory: Normal respiratory effort.  No retractions. Gastrointestinal: Soft and nontender. No distention.  Musculoskeletal: No lower extremity tenderness nor edema. No gross deformities of extremities. Neurologic:  Normal speech and language. No gross focal neurologic deficits are appreciated.  Skin:  Skin is warm, dry and intact. Psychiatric: Mood and affect are normal. Speech and behavior are normal.  ____________________________________________   LABS (all labs ordered are listed, but only abnormal results are displayed)  Labs Reviewed  BASIC METABOLIC PANEL - Abnormal; Notable for the following components:      Result Value   Glucose, Bld 194 (*)    All other components within normal limits  CBC  TROPONIN I (HIGH SENSITIVITY)  TROPONIN I (HIGH SENSITIVITY)   ____________________________________________  EKG  ED ECG REPORT I, [place] N Astaria Nanez, the attending physician, personally viewed and interpreted this ECG.   Date: 07/12/2020  EKG Time: 5:39 PM  Rate: 87  Rhythm: Normal sinus rhythm  Axis: Normal  Intervals: Normal  ST&T Change: None    RADIOLOGY I, [place] N Jessiah Wojnar, personally viewed and evaluated these images (plain radiographs) as part of my medical decision making, as well as reviewing the written report by the radiologist.  ED MD interpretation: No active cardiopulmonary disease noted on chest x-ray per radiologist  Negative head CT per radiologist  Official  radiology report(s): DG Chest 2 View  Result Date: 07/11/2020 CLINICAL DATA:  Dizziness. EXAM: CHEST - 2 VIEW COMPARISON:  June 12, 2020. FINDINGS: Stable cardiomediastinal silhouette. No pneumothorax or pleural effusion is noted. Both lungs are clear. The visualized skeletal structures are unremarkable. IMPRESSION: No active cardiopulmonary disease. Electronically Signed   By: Marijo Conception M.D.   On: 07/11/2020 18:13   CT Head Wo Contrast  Result Date: 07/12/2020 CLINICAL DATA:  Nonspecific dizziness for 3-4 days. EXAM: CT HEAD WITHOUT CONTRAST TECHNIQUE: Contiguous axial images were obtained from the base of the skull through the vertex without intravenous contrast. COMPARISON:  Brain MRI from 06/12/2020 FINDINGS: Brain: No evidence of acute infarction, hemorrhage, hydrocephalus, extra-axial collection or mass lesion/mass effect. The small cortical infarct on comparison brain MRI is not detectable by CT. Vascular: No hyperdense vessel or unexpected calcification. Skull: Normal. Negative for fracture or focal lesion. Sinuses/Orbits: No acute  finding. IMPRESSION: Negative head CT. The small cortical infarct on recent brain MRI is too small for CT visualization. Electronically Signed   By: Monte Fantasia M.D.   On: 07/12/2020 05:51      Procedures   ____________________________________________   INITIAL IMPRESSION / MDM / [place] / ED COURSE  As part of my medical decision making, I reviewed the following data within the electronic MEDICAL RECORD NUMBER   54 year old female presented with above-stated history and physical exam secondary to dizziness following a recent small stroke on 06/12/2020.  Differential diagnosis including but not limited to arrhythmia, new CVA versus progression of previous CVA, vertigo.  Laboratory data unremarkable with exception of an elevated glucose of 194.  Troponin negative x2.  CT head revealed no acute findings.  Plan to obtain an MRI of the brain for  further evaluation. If MRI were negative I would encourage patient to follow-up with Dr. Saunders Revel as she is planning to do today.  Patient's care transferred to Dr. Corky Downs MRI pending ____________________________________________  FINAL CLINICAL IMPRESSION(S) / ED DIAGNOSES  Final diagnoses:  Dizziness     MEDICATIONS GIVEN DURING THIS VISIT:  Medications  LORazepam (ATIVAN) injection 0.5 mg (0.5 mg Intravenous Given 07/12/20 0458)     ED Discharge Orders    None      *Please note:  Angline L Schey was evaluated in Emergency Department on 07/12/2020 for the symptoms described in the history of present illness. She was evaluated in the context of the global COVID-19 pandemic, which necessitated consideration that the patient might be at risk for infection with the SARS-CoV-2 virus that causes COVID-19. Institutional protocols and algorithms that pertain to the evaluation of patients at risk for COVID-19 are in a state of rapid change based on information released by regulatory bodies including the CDC and federal and state organizations. These policies and algorithms were followed during the patient's care in the ED.  Some ED evaluations and interventions may be delayed as a result of limited staffing during and after the pandemic.*  Note:  This document was prepared using Dragon voice recognition software and may include unintentional dictation errors.   Gregor Hams, MD 07/12/20 [phone]

## 2020-07-12 NOTE — ED Notes (Signed)
Pt transported to MRI 

## 2020-07-13 ENCOUNTER — Telehealth: Payer: Self-pay | Admitting: Internal Medicine

## 2020-07-13 NOTE — Telephone Encounter (Signed)
Attempted to schedule.  LMOV to call office.    Needs fu TEE with End

## 2020-07-13 NOTE — ED Provider Notes (Signed)
Noted MRI finding. D/W Dr. Rory Percy of Neurology. He notes subacute stroke and patient has recently had stroke workup. Not the cause of her dizziness which has been going on for over a month. He recommends cardiology f/u which she has today and to continue aspirin. He does not feel she requires admission given recent workup nor additional medications at this time.    Lavonia Drafts, MD 07/13/20 [phone]

## 2020-07-13 NOTE — Telephone Encounter (Signed)
Spoke to patient.  States she just saw her neurologist and they asked why the cardiologist took her off Plavix.  Neurologist was concerned. Patient said the "carvedilol" was stopped yesterday.  She and neurologist thought that was the Plavix. Clarified that is not the Plavix and the generic for Plavix is Clopidogrel. Advised patient she should still be taking Plavix at this time. She verbalized understanding and will call her neurologist back.

## 2020-07-13 NOTE — Telephone Encounter (Signed)
Patient calling States her neurology doctor is concerned and wants to know why she is off Plavix medication Please call to discuss

## 2020-07-13 NOTE — Telephone Encounter (Signed)
Patients account has been posted for $29.00 fee and patient has been mailed a receipt

## 2020-07-14 ENCOUNTER — Other Ambulatory Visit: Payer: Self-pay

## 2020-07-14 ENCOUNTER — Emergency Department
Admission: EM | Admit: 2020-07-14 | Discharge: 2020-07-14 | Disposition: A | Discharge status: Home / Self Care | Payer: 59 | Attending: Emergency Medicine | Admitting: Emergency Medicine

## 2020-07-14 DIAGNOSIS — E119 Type 2 diabetes mellitus without complications: Secondary | ICD-10-CM | POA: Diagnosis not present

## 2020-07-14 DIAGNOSIS — Z87891 Personal history of nicotine dependence: Secondary | ICD-10-CM | POA: Diagnosis not present

## 2020-07-14 DIAGNOSIS — Z79899 Other long term (current) drug therapy: Secondary | ICD-10-CM | POA: Diagnosis not present

## 2020-07-14 DIAGNOSIS — Z7901 Long term (current) use of anticoagulants: Secondary | ICD-10-CM | POA: Insufficient documentation

## 2020-07-14 DIAGNOSIS — I10 Essential (primary) hypertension: Secondary | ICD-10-CM | POA: Insufficient documentation

## 2020-07-14 DIAGNOSIS — Z7984 Long term (current) use of oral hypoglycemic drugs: Secondary | ICD-10-CM | POA: Insufficient documentation

## 2020-07-14 DIAGNOSIS — Z7982 Long term (current) use of aspirin: Secondary | ICD-10-CM | POA: Insufficient documentation

## 2020-07-14 LAB — URINALYSIS, COMPLETE (UACMP) WITH MICROSCOPIC
Bacteria, UA: NONE SEEN
Bilirubin Urine: NEGATIVE
Glucose, UA: NEGATIVE mg/dL
Ketones, ur: 5 mg/dL — AB
Leukocytes,Ua: NEGATIVE
Nitrite: NEGATIVE
Protein, ur: NEGATIVE mg/dL
Specific Gravity, Urine: 1.021 (ref 1.005–1.030)
pH: 5 (ref 5.0–8.0)

## 2020-07-14 LAB — CBC
HCT: 38.5 % (ref 36.0–46.0)
Hemoglobin: 12.2 g/dL (ref 12.0–15.0)
MCH: 27.4 pg (ref 26.0–34.0)
MCHC: 31.7 g/dL (ref 30.0–36.0)
MCV: 86.3 fL (ref 80.0–100.0)
Platelets: 336 10*3/uL (ref 150–400)
RBC: 4.46 MIL/uL (ref 3.87–5.11)
RDW: 13.2 % (ref 11.5–15.5)
WBC: 9.1 10*3/uL (ref 4.0–10.5)
nRBC: 0 % (ref 0.0–0.2)

## 2020-07-14 LAB — BASIC METABOLIC PANEL
Anion gap: 13 (ref 5–15)
BUN: 12 mg/dL (ref 6–20)
CO2: 20 mmol/L — ABNORMAL LOW (ref 22–32)
Calcium: 9.6 mg/dL (ref 8.9–10.3)
Chloride: 101 mmol/L (ref 98–111)
Creatinine, Ser: 0.86 mg/dL (ref 0.44–1.00)
GFR, Estimated: >60 mL/min (ref >60)
Glucose, Bld: 170 mg/dL — ABNORMAL HIGH (ref 70–99)
Potassium: 4.1 mmol/L (ref 3.5–5.1)
Sodium: 134 mmol/L — ABNORMAL LOW (ref 135–145)

## 2020-07-14 LAB — POC URINE PREG, ED: Preg Test, Ur: NEGATIVE

## 2020-07-14 NOTE — ED Provider Notes (Signed)
[facility] Emergency Department Provider Note   ____________________________________________   First MD Initiated Contact with Patient 07/14/20 0750     (approximate)  I have reviewed the triage vital signs and the nursing notes.   HISTORY  Chief Complaint Hypertension    HPI Stacey Richardson is a 54 y.o. female with a stated past medical history of CVA, type 2 diabetes, hyperlipidemia, and hypertension who presents for hypertension.  Patient states that at approximately 430 this morning she felt "jittery" and was concerned "I am having another stroke".  Patient states that her blood pressure had systolics in the 425Z at home.  Patient states that she takes all of her medications on time and as prescribed.  Patient denies any weakness/numbness/paresthesias in any extremity, change in speech or vision, or hearing changes         Past Medical History:  Diagnosis Date  . CVA (cerebral vascular accident) ([place])   . Diabetes mellitus   . HFrEF (heart failure with reduced ejection fraction) ([place])   . HLD (hyperlipidemia)   . Hypertension     Patient Active Problem List   Diagnosis Date Noted  . HFrEF (heart failure with reduced ejection fraction) ([place]) 06/20/2020  . Cardiomyopathy ([place]) 06/20/2020  . Hyperlipidemia LDL goal <70 06/20/2020  . Hyperglycemia   . Cerebrovascular accident (CVA) ([place])   . TIA (transient ischemic attack) 06/12/2020  . Multifocal pneumonia 01/22/2019  . Type 2 diabetes mellitus without complication ([place])   . Essential hypertension     Past Surgical History:  Procedure Laterality Date  . KNEE SURGERY      Prior to Admission medications   Medication Sig Start Date End Date Taking? Authorizing Provider  Acetaminophen-Caff-Pyrilamine (MIDOL COMPLETE) 500-60-15 MG TABS Take 1-2 tablets by mouth every 8 (eight) hours as needed (menstrual cramps).    [provider]  aspirin EC 81 MG tablet Take 81 mg by mouth  daily.     [provider]  clopidogrel (PLAVIX) 75 MG tablet Take 1 tablet (75 mg total) by mouth daily. 06/14/20   Lorella Nimrod, MD  ferrous sulfate 325 (65 FE) MG tablet Take 325 mg by mouth 3 (three) times daily with meals.     [provider]  losartan (COZAAR) 50 MG tablet Take 1 tablet (50 mg total) by mouth daily. 07/12/20 10/10/20  Loel Dubonnet, NP  metFORMIN (GLUCOPHAGE) 500 MG tablet Take 1,000 mg by mouth 2 (two) times daily with a meal.  09/08/13   [provider]  rosuvastatin (CRESTOR) 40 MG tablet Take 1 tablet (40 mg total) by mouth daily. 06/14/20   Lorella Nimrod, MD  tetrahydrozoline 0.05 % ophthalmic solution Place 1-2 drops into both eyes 3 (three) times daily as needed (irritated eyes.).    [provider]    Allergies Lisinopril  Family History  Problem Relation Age of Onset  . Heart disease Mother        a. ?valve    Social History Social History   Tobacco Use  . Smoking status: Former Research scientist (life sciences)  . Smokeless tobacco: Never Used  Substance Use Topics  . Alcohol use: Yes  . Drug use: No    Review of Systems Constitutional: No fever/chills Eyes: No visual changes. ENT: No sore throat. Cardiovascular: Denies chest pain. Respiratory: Denies shortness of breath. Gastrointestinal: No abdominal pain.  No nausea, no vomiting.  No diarrhea. Genitourinary: Negative for dysuria. Musculoskeletal: Negative for acute arthralgias Skin: Negative for rash. Neurological: Negative  for headaches, weakness/numbness/paresthesias in any extremity Psychiatric: Negative for suicidal ideation/homicidal ideation   ____________________________________________   PHYSICAL EXAM:  VITAL SIGNS: ED Triage Vitals  Enc Vitals Group     BP 07/14/20 0708 (!) 161/83     Pulse Rate 07/14/20 0708 (!) 105     Resp 07/14/20 0708 17     Temp 07/14/20 0711 99 F (37.2 C)     Temp Source 07/14/20 0708 Oral     SpO2 07/14/20 0708 100 %     Weight  07/14/20 0708 197 lb (89.4 kg)     Height 07/14/20 0708 5\' 5"  (1.651 m)     Head Circumference --      Peak Flow --      Pain Score 07/14/20 0708 0     Pain Loc --      Pain Edu? --      Excl. in [place]? --    Constitutional: Alert and oriented. Well appearing and in no acute distress. Eyes: Conjunctivae are normal. PERRL. Head: Atraumatic. Nose: No congestion/rhinnorhea. Mouth/Throat: Mucous membranes are moist. Neck: No stridor Cardiovascular: Grossly normal heart sounds.  Good peripheral circulation. Respiratory: Normal respiratory effort.  No retractions. Gastrointestinal: Soft and nontender. No distention. Musculoskeletal: No obvious deformities Neurologic:  Normal speech and language. No gross focal neurologic deficits are appreciated. Skin:  Skin is warm and dry. No rash noted. Psychiatric: Mood and affect are normal. Speech and behavior are normal.  ____________________________________________   LABS (all labs ordered are listed, but only abnormal results are displayed)  Labs Reviewed  BASIC METABOLIC PANEL - Abnormal; Notable for the following components:      Result Value   Sodium 134 (*)    CO2 20 (*)    Glucose, Bld 170 (*)    All other components within normal limits  URINALYSIS, COMPLETE (UACMP) WITH MICROSCOPIC - Abnormal; Notable for the following components:   Color, Urine YELLOW (*)    APPearance CLEAR (*)    Hgb urine dipstick SMALL (*)    Ketones, ur 5 (*)    All other components within normal limits  CBC  POC URINE PREG, ED  CBG MONITORING, ED   ____________________________________________  EKG  ED ECG REPORT I, Naaman Plummer, the attending physician, personally viewed and interpreted this ECG.  Date: 07/14/2020 EKG Time: 0711 Rate: 102 Rhythm: normal sinus rhythm QRS Axis: normal Intervals: normal ST/T Wave abnormalities: normal Narrative Interpretation: no evidence of acute ischemia  PROCEDURES  Procedure(s) performed (including  Critical Care):  .1-3 Lead EKG Interpretation Performed by: Naaman Plummer, MD Authorized by: Naaman Plummer, MD     Interpretation: normal     ECG rate:  89   ECG rate assessment: normal     Rhythm: sinus rhythm     Ectopy: none     Conduction: normal       ____________________________________________   INITIAL IMPRESSION / ASSESSMENT AND PLAN / ED COURSE  As part of my medical decision making, I reviewed the following data within the [place] notes reviewed and incorporated, Labs reviewed, EKG interpreted, Old chart reviewed, Radiograph reviewed and Notes from prior ED visits reviewed and incorporated        Presents to the emergency department complaining of high blood pressure. Patient is otherwise asymptomatic without confusion, chest pain, hematuria, or SOB. - Nonadherence to antihypertensive regimen DDx: CV, AMI, heart failure, renal infarction or failure or other end organ damage.  Disposition: Discussed with patient  their elevated blood pressure and need for close outpatient management of their hypertension. Will provide a prescription for the patients previous antihypertensive medication and arrange for the patient to follow up in a primary care clinic      ____________________________________________   FINAL CLINICAL IMPRESSION(S) / ED DIAGNOSES  Final diagnoses:  Primary hypertension     ED Discharge Orders    None       Note:  This document was prepared using Dragon voice recognition software and may include unintentional dictation errors.   Naaman Plummer, MD 07/14/20 [phone]

## 2020-07-14 NOTE — ED Triage Notes (Signed)
Pt c/o waking around 430am this morning feeling "jittery" "I hope im not having another stroke". States her b/op was elevated at home. Denies any weakness or numbness, no changes with speech or vision, just feeling a little light headed,.

## 2020-07-14 NOTE — ED Notes (Signed)
Pt states that this morning her blood pressure was high, pt reports diastolic BP 051, pt was concerned due to recent stroke.

## 2020-07-15 ENCOUNTER — Other Ambulatory Visit: Payer: Self-pay

## 2020-07-15 ENCOUNTER — Encounter (HOSPITAL_COMMUNITY): Payer: Self-pay | Admitting: *Deleted

## 2020-07-15 ENCOUNTER — Emergency Department (HOSPITAL_COMMUNITY)
Admission: EM | Admit: 2020-07-15 | Discharge: 2020-07-15 | Disposition: A | Discharge status: Home / Self Care | Payer: 59 | Attending: Emergency Medicine | Admitting: Emergency Medicine

## 2020-07-15 ENCOUNTER — Emergency Department (HOSPITAL_COMMUNITY): Payer: 59

## 2020-07-15 DIAGNOSIS — Z87891 Personal history of nicotine dependence: Secondary | ICD-10-CM | POA: Insufficient documentation

## 2020-07-15 DIAGNOSIS — E1165 Type 2 diabetes mellitus with hyperglycemia: Secondary | ICD-10-CM | POA: Diagnosis not present

## 2020-07-15 DIAGNOSIS — Z7901 Long term (current) use of anticoagulants: Secondary | ICD-10-CM | POA: Insufficient documentation

## 2020-07-15 DIAGNOSIS — Z7982 Long term (current) use of aspirin: Secondary | ICD-10-CM | POA: Diagnosis not present

## 2020-07-15 DIAGNOSIS — Z79899 Other long term (current) drug therapy: Secondary | ICD-10-CM | POA: Insufficient documentation

## 2020-07-15 DIAGNOSIS — I110 Hypertensive heart disease with heart failure: Secondary | ICD-10-CM | POA: Insufficient documentation

## 2020-07-15 DIAGNOSIS — Z7984 Long term (current) use of oral hypoglycemic drugs: Secondary | ICD-10-CM | POA: Insufficient documentation

## 2020-07-15 DIAGNOSIS — R0602 Shortness of breath: Secondary | ICD-10-CM | POA: Diagnosis present

## 2020-07-15 DIAGNOSIS — Z91010 Allergy to peanuts: Secondary | ICD-10-CM | POA: Diagnosis not present

## 2020-07-15 DIAGNOSIS — I509 Heart failure, unspecified: Secondary | ICD-10-CM | POA: Insufficient documentation

## 2020-07-15 LAB — CBC WITH DIFFERENTIAL/PLATELET
Abs Immature Granulocytes: 0.05 10*3/uL (ref 0.00–0.07)
Basophils Absolute: 0.1 10*3/uL (ref 0.0–0.1)
Basophils Relative: 1 %
Eosinophils Absolute: 0.2 10*3/uL (ref 0.0–0.5)
Eosinophils Relative: 2 %
HCT: 39.5 % (ref 36.0–46.0)
Hemoglobin: 12.5 g/dL (ref 12.0–15.0)
Immature Granulocytes: 1 %
Lymphocytes Relative: 21 %
Lymphs Abs: 1.8 10*3/uL (ref 0.7–4.0)
MCH: 27.8 pg (ref 26.0–34.0)
MCHC: 31.6 g/dL (ref 30.0–36.0)
MCV: 88 fL (ref 80.0–100.0)
Monocytes Absolute: 0.6 10*3/uL (ref 0.1–1.0)
Monocytes Relative: 8 %
Neutro Abs: 5.8 10*3/uL (ref 1.7–7.7)
Neutrophils Relative %: 67 %
Platelets: 341 10*3/uL (ref 150–400)
RBC: 4.49 MIL/uL (ref 3.87–5.11)
RDW: 13.2 % (ref 11.5–15.5)
WBC: 8.6 10*3/uL (ref 4.0–10.5)
nRBC: 0 % (ref 0.0–0.2)

## 2020-07-15 LAB — COMPREHENSIVE METABOLIC PANEL
ALT: 15 U/L (ref 0–44)
AST: 14 U/L — ABNORMAL LOW (ref 15–41)
Albumin: 4.3 g/dL (ref 3.5–5.0)
Alkaline Phosphatase: 48 U/L (ref 38–126)
Anion gap: 11 (ref 5–15)
BUN: 10 mg/dL (ref 6–20)
CO2: 22 mmol/L (ref 22–32)
Calcium: 9.3 mg/dL (ref 8.9–10.3)
Chloride: 101 mmol/L (ref 98–111)
Creatinine, Ser: 0.56 mg/dL (ref 0.44–1.00)
GFR, Estimated: >60 mL/min (ref >60)
Glucose, Bld: 108 mg/dL — ABNORMAL HIGH (ref 70–99)
Potassium: 3.8 mmol/L (ref 3.5–5.1)
Sodium: 134 mmol/L — ABNORMAL LOW (ref 135–145)
Total Bilirubin: 0.6 mg/dL (ref 0.3–1.2)
Total Protein: 7.9 g/dL (ref 6.5–8.1)

## 2020-07-15 NOTE — Discharge Instructions (Addendum)
Follow-up with your family doctor this week for recheck 

## 2020-07-15 NOTE — ED Triage Notes (Signed)
C/o shortness of breath, very upset over  recent stroke and the procedures that are to follow

## 2020-07-15 NOTE — ED Notes (Signed)
PT educated with DC instructions and verbalized complete understanding of plan of care and denies questions at this time. PT VSS NAD PT remains a/o x 4 skin warm dry intact. Pt had no IV established. Pt self ambulated to exit with steady gait to be transported by family.

## 2020-07-15 NOTE — ED Provider Notes (Signed)
[facility] EMERGENCY DEPARTMENT Provider Note   CSN: [id number] Arrival date & time: 07/15/20  1755     History Chief Complaint  Patient presents with  . Shortness of Breath    Stacey Richardson is a 54 y.o. female.  Patient had some shortness of breath earlier today and also felt funny in her head.  She is recently had a stroke  The history is provided by the patient and a significant other.  Shortness of Breath Severity:  Mild Onset quality:  Sudden Timing:  Unable to specify Progression:  Improving Chronicity:  Recurrent Context: activity   Relieved by:  Nothing Worsened by:  Nothing Ineffective treatments:  None tried Associated symptoms: no abdominal pain, no chest pain, no cough, no headaches and no rash   Risk factors: no recent alcohol use        Past Medical History:  Diagnosis Date  . CVA (cerebral vascular accident) ([place])   . Diabetes mellitus   . HFrEF (heart failure with reduced ejection fraction) ([place])   . HLD (hyperlipidemia)   . Hypertension     Patient Active Problem List   Diagnosis Date Noted  . HFrEF (heart failure with reduced ejection fraction) ([place]) 06/20/2020  . Cardiomyopathy ([place]) 06/20/2020  . Hyperlipidemia LDL goal <70 06/20/2020  . Hyperglycemia   . Cerebrovascular accident (CVA) ([place])   . TIA (transient ischemic attack) 06/12/2020  . Multifocal pneumonia 01/22/2019  . Type 2 diabetes mellitus without complication ([place])   . Essential hypertension     Past Surgical History:  Procedure Laterality Date  . KNEE SURGERY       OB History   No obstetric history on file.     Family History  Problem Relation Age of Onset  . Heart disease Mother        a. ?valve    Social History   Tobacco Use  . Smoking status: Former Research scientist (life sciences)  . Smokeless tobacco: Never Used  Substance Use Topics  . Alcohol use: Yes  . Drug use: No    Home Medications Prior to Admission medications   Medication Sig Start Date End Date Taking?  Authorizing Provider  Acetaminophen-Caff-Pyrilamine (MIDOL COMPLETE) 500-60-15 MG TABS Take 1-2 tablets by mouth every 8 (eight) hours as needed (menstrual cramps).   Yes [provider]  aspirin EC 81 MG tablet Take 81 mg by mouth daily.    Yes [provider]  clopidogrel (PLAVIX) 75 MG tablet Take 1 tablet (75 mg total) by mouth daily. 06/14/20  Yes Lorella Nimrod, MD  ferrous sulfate 325 (65 FE) MG tablet Take 325 mg by mouth 3 (three) times daily with meals.    Yes [provider]  losartan (COZAAR) 50 MG tablet Take 1 tablet (50 mg total) by mouth daily. 07/12/20 10/10/20 Yes Loel Dubonnet, NP  metFORMIN (GLUCOPHAGE) 500 MG tablet Take 1,000 mg by mouth 2 (two) times daily with a meal.  09/08/13  Yes [provider]  rosuvastatin (CRESTOR) 40 MG tablet Take 1 tablet (40 mg total) by mouth daily. 06/14/20  Yes Lorella Nimrod, MD  tetrahydrozoline 0.05 % ophthalmic solution Place 1-2 drops into both eyes 3 (three) times daily as needed (irritated eyes.).   Yes [provider]    Allergies    Peanut butter flavor and Lisinopril  Review of Systems   Review of Systems  Constitutional: Negative for appetite change and fatigue.  HENT: Negative for congestion, ear discharge and sinus pressure.  Mild dyspnea  Eyes: Negative for discharge.  Respiratory: Positive for shortness of breath. Negative for cough.   Cardiovascular: Negative for chest pain.  Gastrointestinal: Negative for abdominal pain and diarrhea.  Genitourinary: Negative for frequency and hematuria.  Musculoskeletal: Negative for back pain.  Skin: Negative for rash.  Neurological: Negative for seizures and headaches.  Psychiatric/Behavioral: Negative for hallucinations.    Physical Exam Updated Vital Signs BP 127/77 (BP Location: Right Arm)   Pulse 79   Temp (!) 97.4 F (36.3 C) (Temporal)   Resp (!) 24   LMP 06/27/2020 (Approximate)   SpO2 100%   Physical Exam Vitals  reviewed.  Constitutional:      Appearance: She is well-developed.  HENT:     Head: Normocephalic.     Nose: Nose normal.  Eyes:     General: No scleral icterus.    Conjunctiva/sclera: Conjunctivae normal.  Neck:     Thyroid: No thyromegaly.  Cardiovascular:     Rate and Rhythm: Normal rate and regular rhythm.     Heart sounds: No murmur heard.  No friction rub. No gallop.   Pulmonary:     Breath sounds: No stridor. No wheezing or rales.  Chest:     Chest wall: No tenderness.  Abdominal:     General: There is no distension.     Tenderness: There is no abdominal tenderness. There is no rebound.  Musculoskeletal:        General: Normal range of motion.     Cervical back: Neck supple.  Lymphadenopathy:     Cervical: No cervical adenopathy.  Skin:    Findings: No erythema or rash.  Neurological:     Mental Status: She is alert and oriented to person, place, and time.     Motor: No abnormal muscle tone.     Coordination: Coordination normal.  Psychiatric:        Behavior: Behavior normal.     ED Results / Procedures / Treatments   Labs (all labs ordered are listed, but only abnormal results are displayed) Labs Reviewed  COMPREHENSIVE METABOLIC PANEL - Abnormal; Notable for the following components:      Result Value   Sodium 134 (*)    Glucose, Bld 108 (*)    AST 14 (*)    All other components within normal limits  CBC WITH DIFFERENTIAL/PLATELET    EKG None  Radiology DG Chest 2 View  Result Date: 07/15/2020 CLINICAL DATA:  Shortness of breath EXAM: CHEST - 2 VIEW COMPARISON:  July 11, 2020 FINDINGS: The heart size and mediastinal contours are within normal limits. Both lungs are clear. The visualized skeletal structures are unremarkable. IMPRESSION: No active cardiopulmonary disease. Electronically Signed   By: Prudencio Pair M.D.   On: 07/15/2020 22:28   CT Head Wo Contrast  Result Date: 07/15/2020 CLINICAL DATA:  Shortness of breath dizziness and anxiety  EXAM: CT HEAD WITHOUT CONTRAST TECHNIQUE: Contiguous axial images were obtained from the base of the skull through the vertex without intravenous contrast. COMPARISON:  July 12, 2020 FINDINGS: Brain: No evidence of acute territorial infarction, hemorrhage, hydrocephalus,extra-axial collection or mass lesion/mass effect. Normal gray-white differentiation. Ventricles are normal in size and contour. Vascular: No hyperdense vessel or unexpected calcification. Skull: The skull is intact. No fracture or focal lesion identified. Sinuses/Orbits: The visualized paranasal sinuses and mastoid air cells are clear. The orbits and globes intact. Other: None IMPRESSION: The recent prior left corona radiata infarct is not seen on this examination. No acute intracranial  pathology. Electronically Signed   By: Prudencio Pair M.D.   On: 07/15/2020 22:30    Procedures Procedures (including critical care time)  Medications Ordered in ED Medications - No data to display  ED Course  I have reviewed the triage vital signs and the nursing notes.  Pertinent labs & imaging results that were available during my care of the patient were reviewed by me and considered in my medical decision making (see chart for details).    MDM Rules/Calculators/A&P                          CBC chemistries CT head and chest x-ray are unremarkable.  Patient is no longer short of breath or has had funny feeling in her head.  Suspect stress related symptoms     This patient presents to the ED for concern of shortness of breath this involves an extensive number of treatment options, and is a complaint that carries with it a high risk of complications and morbidity.  The differential diagnosis includes pneumonia   Lab Tests:   I Ordered, reviewed, and interpreted labs, which included CBC chemistries unremarkable  Medicines ordered:     Imaging Studies ordered:   I ordered imaging studies which included CT head chest x-ray  negative  I independently visualized and interpreted imaging which showed negative  Additional history obtained:   Additional history obtained from records  Previous records obtained and reviewed.  Consultations Obtained:     Reevaluation:  After the interventions stated above, I reevaluated the patient and found improved  Critical Interventions:  .   Final Clinical Impression(s) / ED Diagnoses Final diagnoses:  SOB (shortness of breath)    Rx / DC Orders ED Discharge Orders    None       Milton Ferguson, MD 07/16/20 [phone]

## 2020-07-17 ENCOUNTER — Other Ambulatory Visit: Payer: Self-pay

## 2020-07-17 ENCOUNTER — Other Ambulatory Visit
Admission: RE | Admit: 2020-07-17 | Discharge: 2020-07-17 | Disposition: A | Discharge status: Home / Self Care | Payer: 59 | Source: Ambulatory Visit | Attending: Internal Medicine | Admitting: Internal Medicine

## 2020-07-17 DIAGNOSIS — Z01818 Encounter for other preprocedural examination: Secondary | ICD-10-CM | POA: Diagnosis present

## 2020-07-17 DIAGNOSIS — Z20822 Contact with and (suspected) exposure to covid-19: Secondary | ICD-10-CM | POA: Diagnosis not present

## 2020-07-18 ENCOUNTER — Ambulatory Visit (HOSPITAL_BASED_OUTPATIENT_CLINIC_OR_DEPARTMENT_OTHER)
Admission: RE | Admit: 2020-07-18 | Discharge: 2020-07-18 | Disposition: A | Discharge status: Home / Self Care | Payer: 59 | Source: Home / Self Care | Attending: Family | Admitting: Family

## 2020-07-18 ENCOUNTER — Other Ambulatory Visit: Payer: Self-pay

## 2020-07-18 ENCOUNTER — Encounter
Admission: RE | Disposition: A | Discharge status: Home / Self Care | Payer: Self-pay | Source: Home / Self Care | Attending: Internal Medicine

## 2020-07-18 ENCOUNTER — Telehealth: Payer: Self-pay | Admitting: Internal Medicine

## 2020-07-18 ENCOUNTER — Ambulatory Visit
Admission: RE | Admit: 2020-07-18 | Discharge: 2020-07-18 | Disposition: A | Discharge status: Home / Self Care | Payer: 59 | Attending: Internal Medicine | Admitting: Internal Medicine

## 2020-07-18 ENCOUNTER — Encounter: Payer: Self-pay | Admitting: Internal Medicine

## 2020-07-18 DIAGNOSIS — Z7902 Long term (current) use of antithrombotics/antiplatelets: Secondary | ICD-10-CM | POA: Insufficient documentation

## 2020-07-18 DIAGNOSIS — I441 Atrioventricular block, second degree: Secondary | ICD-10-CM | POA: Diagnosis not present

## 2020-07-18 DIAGNOSIS — I6389 Other cerebral infarction: Secondary | ICD-10-CM | POA: Diagnosis not present

## 2020-07-18 DIAGNOSIS — Z7982 Long term (current) use of aspirin: Secondary | ICD-10-CM | POA: Diagnosis not present

## 2020-07-18 DIAGNOSIS — E119 Type 2 diabetes mellitus without complications: Secondary | ICD-10-CM | POA: Diagnosis not present

## 2020-07-18 DIAGNOSIS — E785 Hyperlipidemia, unspecified: Secondary | ICD-10-CM | POA: Diagnosis not present

## 2020-07-18 DIAGNOSIS — Z8673 Personal history of transient ischemic attack (TIA), and cerebral infarction without residual deficits: Secondary | ICD-10-CM | POA: Insufficient documentation

## 2020-07-18 DIAGNOSIS — I639 Cerebral infarction, unspecified: Secondary | ICD-10-CM

## 2020-07-18 DIAGNOSIS — Z888 Allergy status to other drugs, medicaments and biological substances status: Secondary | ICD-10-CM | POA: Diagnosis not present

## 2020-07-18 DIAGNOSIS — I700 Atherosclerosis of aorta: Secondary | ICD-10-CM | POA: Diagnosis not present

## 2020-07-18 DIAGNOSIS — Z79899 Other long term (current) drug therapy: Secondary | ICD-10-CM | POA: Insufficient documentation

## 2020-07-18 DIAGNOSIS — Z7984 Long term (current) use of oral hypoglycemic drugs: Secondary | ICD-10-CM | POA: Diagnosis not present

## 2020-07-18 DIAGNOSIS — R42 Dizziness and giddiness: Secondary | ICD-10-CM | POA: Insufficient documentation

## 2020-07-18 DIAGNOSIS — I340 Nonrheumatic mitral (valve) insufficiency: Secondary | ICD-10-CM | POA: Insufficient documentation

## 2020-07-18 DIAGNOSIS — I5022 Chronic systolic (congestive) heart failure: Secondary | ICD-10-CM | POA: Insufficient documentation

## 2020-07-18 DIAGNOSIS — I110 Hypertensive heart disease with heart failure: Secondary | ICD-10-CM | POA: Insufficient documentation

## 2020-07-18 HISTORY — PX: TEE WITHOUT CARDIOVERSION: SHX5443

## 2020-07-18 LAB — SARS CORONAVIRUS 2 (TAT 6-24 HRS): SARS Coronavirus 2: NEGATIVE

## 2020-07-18 SURGERY — ECHOCARDIOGRAM, TRANSESOPHAGEAL
Anesthesia: Moderate Sedation

## 2020-07-18 MED ORDER — SODIUM CHLORIDE 0.9 % IV SOLN: INTRAVENOUS | Status: DC

## 2020-07-18 MED ORDER — MIDAZOLAM HCL 5 MG/5ML IJ SOLN
INTRAMUSCULAR | Status: AC | PRN
  Administered 2020-07-18 (×2): 1 mg via INTRAVENOUS
  Administered 2020-07-18: 2 mg via INTRAVENOUS

## 2020-07-18 MED ORDER — LIDOCAINE VISCOUS HCL 2 % MT SOLN
OROMUCOSAL | Status: AC
  Filled 2020-07-18: qty 15

## 2020-07-18 MED ORDER — MIDAZOLAM HCL 5 MG/5ML IJ SOLN
INTRAMUSCULAR | Status: AC
  Filled 2020-07-18: qty 5

## 2020-07-18 MED ORDER — SODIUM CHLORIDE FLUSH 0.9 % IV SOLN
INTRAVENOUS | Status: AC
  Filled 2020-07-18: qty 10

## 2020-07-18 MED ORDER — FENTANYL CITRATE (PF) 100 MCG/2ML IJ SOLN
INTRAMUSCULAR | Status: AC
  Filled 2020-07-18: qty 2

## 2020-07-18 MED ORDER — FENTANYL CITRATE (PF) 100 MCG/2ML IJ SOLN
INTRAMUSCULAR | Status: AC | PRN
Start: 2020-07-18 — End: 2020-07-18
  Administered 2020-07-18 (×3): 25 ug via INTRAVENOUS

## 2020-07-18 NOTE — Interval H&P Note (Signed)
History and Physical Interval Note:  07/18/2020 7:47 AM  Stacey Richardson  has presented today for surgery, with the diagnosis of stroke.  The various methods of treatment have been discussed with the patient and family. After consideration of risks, benefits and other options for treatment, the patient has consented to  Procedure(s): TRANSESOPHAGEAL ECHOCARDIOGRAM (TEE) (N/A) as a surgical intervention.  The patient's history has been reviewed, patient examined, no change in status, stable for surgery.  I have reviewed the patient's chart and labs.  Questions were answered to the patient's satisfaction.     [name]

## 2020-07-18 NOTE — CV Procedure (Signed)
    Transesophageal Echocardiogram Note  Stacey Richardson [id number] 09-26-1965  Procedure: Transesophageal Echocardiogram Indications: Cryptogenic stroke  Procedure Details Consent: Obtained Time Out: Verified patient identification, verified procedure, site/side was marked, verified correct patient position, special equipment/implants available, Radiology Safety Procedures followed,  medications/allergies/relevent history reviewed, required imaging and test results available.  Performed  Medications:  During this procedure the patient is administered a total of Versed 4 mg and Fentanyl 75 mcg  to achieve and maintain moderate conscious sedation.  The patient's heart rate, blood pressure, and oxygen saturation are monitored continuously during the procedure. The period of conscious sedation is 19 minutes, of which I was present face-to-face 100% of this time.  Left Ventrical:  Moderately dilated with LVEF 30-35%.  Mitral Valve: Normal with mild MR.  Aortic Valve: Trileaflet.  No AI.  Tricuspid Valve: Normal with trivial TR.  Pulmonic Valve: Not well-visualized.  No PR  Left Atrium/ Left atrial appendage: No thrombus  Atrial septum: Possible tunnel but no interatrial shunt by color Doppler or bubble study.  Aorta: Normal caliber.  Mild plaquing of descending aorta.   Complications: No apparent complications Patient did tolerate procedure well.   [name], MD 07/18/2020, 8:30 AM

## 2020-07-18 NOTE — Progress Notes (Signed)
*  PRELIMINARY RESULTS* Echocardiogram Echocardiogram Transesophageal has been performed.  Sherrie Sport 07/18/2020, 8:38 AM

## 2020-07-18 NOTE — Telephone Encounter (Signed)
Patient states she had a heart procedure and asks if she should take her heart medications , please call and advise.

## 2020-07-19 ENCOUNTER — Encounter: Payer: Self-pay | Admitting: Internal Medicine

## 2020-07-19 NOTE — Telephone Encounter (Signed)
Stacey Dubonnet, NP  07/19/2020 8:14 AM EST     TEE shows known moderately decreased heart pumping function. She did have plaque on her descending aorta which her Atorvastatin will help prevent from progressing. Her bubble study was negative which is a good result.   There is another test called a 'transcranial doppler' to be considered which would be performed by neurology but there is no need to do this urgently and it can be discussed at her follow up 07/26/20. I have forwarded these results to her neurologist office.  Discussed with Dr. Saunders Revel - recommend she continue her current medications until her upcoming visit 07/26/20. Please confirm that she discontinued her Carvedilol (Coreg) and continued her Clopidogrel (Plavix) - there was some confusion after her office visit.     Results called to pt. Pt verbalized understanding. The results answered her questions about the medications.

## 2020-07-20 ENCOUNTER — Other Ambulatory Visit: Payer: Self-pay

## 2020-07-20 ENCOUNTER — Emergency Department: Payer: 59

## 2020-07-20 ENCOUNTER — Emergency Department
Admission: EM | Admit: 2020-07-20 | Discharge: 2020-07-20 | Disposition: A | Discharge status: Home / Self Care | Payer: 59 | Attending: Emergency Medicine | Admitting: Emergency Medicine

## 2020-07-20 DIAGNOSIS — Z87891 Personal history of nicotine dependence: Secondary | ICD-10-CM | POA: Diagnosis not present

## 2020-07-20 DIAGNOSIS — E1165 Type 2 diabetes mellitus with hyperglycemia: Secondary | ICD-10-CM | POA: Diagnosis not present

## 2020-07-20 DIAGNOSIS — Z79899 Other long term (current) drug therapy: Secondary | ICD-10-CM | POA: Diagnosis not present

## 2020-07-20 DIAGNOSIS — Z91010 Allergy to peanuts: Secondary | ICD-10-CM | POA: Insufficient documentation

## 2020-07-20 DIAGNOSIS — R42 Dizziness and giddiness: Secondary | ICD-10-CM | POA: Diagnosis present

## 2020-07-20 DIAGNOSIS — I509 Heart failure, unspecified: Secondary | ICD-10-CM | POA: Diagnosis not present

## 2020-07-20 DIAGNOSIS — Z7982 Long term (current) use of aspirin: Secondary | ICD-10-CM | POA: Insufficient documentation

## 2020-07-20 DIAGNOSIS — Z7901 Long term (current) use of anticoagulants: Secondary | ICD-10-CM | POA: Diagnosis not present

## 2020-07-20 DIAGNOSIS — Z7984 Long term (current) use of oral hypoglycemic drugs: Secondary | ICD-10-CM | POA: Diagnosis not present

## 2020-07-20 DIAGNOSIS — I110 Hypertensive heart disease with heart failure: Secondary | ICD-10-CM | POA: Diagnosis not present

## 2020-07-20 LAB — BASIC METABOLIC PANEL
Anion gap: 9 (ref 5–15)
BUN: 11 mg/dL (ref 6–20)
CO2: 24 mmol/L (ref 22–32)
Calcium: 9.6 mg/dL (ref 8.9–10.3)
Chloride: 101 mmol/L (ref 98–111)
Creatinine, Ser: 0.84 mg/dL (ref 0.44–1.00)
GFR, Estimated: >60 mL/min (ref >60)
Glucose, Bld: 149 mg/dL — ABNORMAL HIGH (ref 70–99)
Potassium: 3.9 mmol/L (ref 3.5–5.1)
Sodium: 134 mmol/L — ABNORMAL LOW (ref 135–145)

## 2020-07-20 LAB — BRAIN NATRIURETIC PEPTIDE: B Natriuretic Peptide: 26.5 pg/mL (ref 0.0–100.0)

## 2020-07-20 LAB — CBC
HCT: 37.3 % (ref 36.0–46.0)
Hemoglobin: 12.4 g/dL (ref 12.0–15.0)
MCH: 28.4 pg (ref 26.0–34.0)
MCHC: 33.2 g/dL (ref 30.0–36.0)
MCV: 85.6 fL (ref 80.0–100.0)
Platelets: 310 10*3/uL (ref 150–400)
RBC: 4.36 MIL/uL (ref 3.87–5.11)
RDW: 13.2 % (ref 11.5–15.5)
WBC: 7.8 10*3/uL (ref 4.0–10.5)
nRBC: 0 % (ref 0.0–0.2)

## 2020-07-20 LAB — TROPONIN I (HIGH SENSITIVITY): Troponin I (High Sensitivity): 3 ng/L (ref <18)

## 2020-07-20 NOTE — ED Triage Notes (Addendum)
Pt comes via POV from home with c/o SOB, Dizziness and some chest soreness. Pt states she feels like she is not getting enough oxygen to her head.  Pt states this has been going on for couple hours today.  Pt denies any numbness, tingling or blurred vision. Pt states she takes Plavix and aspirin.

## 2020-07-20 NOTE — ED Provider Notes (Signed)
[facility] Emergency Department Provider Note  ____________________________________________   First MD Initiated Contact with Patient 07/20/20 1731     (approximate)  I have reviewed the triage vital signs and the nursing notes.   HISTORY  Chief Complaint Shortness of Breath and Chest Pain   HPI Stacey Richardson is a 54 y.o. female with past medical history of HTN, HDL, DM, and recent CVA without residual deficits as well as diagnosis of heart failure with reduced ejection fraction who presents for assessment of approximately 1 month of intermittent episodes of lightheadedness.  Patient states that she is coming emergency room today because she had an episode that lasted a couple hours where she felt like she might pass out but this resolved and she is feeling back to baseline.  She states these episodes usually last a couple of hours and have been occurring on almost every other day basis since she was discharged October 4.  She denies any other acute symptoms including headache, earache, sore throat, vision changes, vertigo, chest pain, cough, shortness of breath, nausea, vomiting, diarrhea, dysuria, abdominal pain, back pain, rash, focal extremity weakness numbness or tingling, EtOH use, drug use, tobacco abuse.  No clear alleviating aggravating factors.         Past Medical History:  Diagnosis Date  . CVA (cerebral vascular accident) ([place])   . Diabetes mellitus   . HFrEF (heart failure with reduced ejection fraction) ([place])   . HLD (hyperlipidemia)   . Hypertension     Patient Active Problem List   Diagnosis Date Noted  . HFrEF (heart failure with reduced ejection fraction) ([place]) 06/20/2020  . Cardiomyopathy ([place]) 06/20/2020  . Hyperlipidemia LDL goal <70 06/20/2020  . Hyperglycemia   . Cryptogenic stroke ([place])   . TIA (transient ischemic attack) 06/12/2020  . Multifocal pneumonia 01/22/2019  . Type 2 diabetes mellitus without complication ([place])    . Essential hypertension     Past Surgical History:  Procedure Laterality Date  . KNEE SURGERY    . TEE WITHOUT CARDIOVERSION N/A 07/18/2020   Procedure: TRANSESOPHAGEAL ECHOCARDIOGRAM (TEE);  Surgeon: [name], MD;  Location: [facility] ORS;  Service: Cardiovascular;  Laterality: N/A;    Prior to Admission medications   Medication Sig Start Date End Date Taking? Authorizing Provider  Acetaminophen-Caff-Pyrilamine (MIDOL COMPLETE) 500-60-15 MG TABS Take 1-2 tablets by mouth every 8 (eight) hours as needed (menstrual cramps).    [provider]  aspirin EC 81 MG tablet Take 81 mg by mouth daily.     [provider]  atorvastatin (LIPITOR) 40 MG tablet Take 40 mg by mouth.    [provider]  clopidogrel (PLAVIX) 75 MG tablet Take 1 tablet (75 mg total) by mouth daily. 06/14/20   Lorella Nimrod, MD  ferrous sulfate 325 (65 FE) MG tablet Take 325 mg by mouth 3 (three) times daily with meals.     [provider]  losartan (COZAAR) 50 MG tablet Take 1 tablet (50 mg total) by mouth daily. 07/12/20 10/10/20  Loel Dubonnet, NP  metFORMIN (GLUCOPHAGE) 500 MG tablet Take 1,000 mg by mouth 2 (two) times daily with a meal.  09/08/13   [provider]  tetrahydrozoline 0.05 % ophthalmic solution Place 1-2 drops into both eyes 3 (three) times daily as needed (irritated eyes.).    [provider]    Allergies Peanut butter flavor and Lisinopril  Family History  Problem Relation Age of Onset  . Heart disease Mother  a. ?valve    Social History Social History   Tobacco Use  . Smoking status: Former Research scientist (life sciences)  . Smokeless tobacco: Never Used  Vaping Use  . Vaping Use: Never used  Substance Use Topics  . Alcohol use: Yes    Comment: social  . Drug use: No    Review of Systems  Review of Systems  Constitutional: Negative for chills and fever.  HENT: Negative for sore throat.   Eyes: Negative for pain.  Respiratory: Negative for  cough and stridor.   Cardiovascular: Negative for chest pain.  Gastrointestinal: Negative for vomiting.  Genitourinary: Negative for dysuria.  Musculoskeletal: Negative for myalgias.  Skin: Negative for rash.  Neurological: Positive for dizziness. Negative for seizures, loss of consciousness and headaches.  Psychiatric/Behavioral: Negative for suicidal ideas.  All other systems reviewed and are negative.     ____________________________________________   PHYSICAL EXAM:  VITAL SIGNS: ED Triage Vitals  Enc Vitals Group     BP 07/20/20 1611 136/71     Pulse Rate 07/20/20 1611 94     Resp 07/20/20 1611 18     Temp 07/20/20 1611 98.9 F (37.2 C)     Temp Source 07/20/20 1611 Oral     SpO2 07/20/20 1611 99 %     Weight 07/20/20 1605 198 lb (89.8 kg)     Height 07/20/20 1605 5\' 5"  (1.651 m)     Head Circumference --      Peak Flow --      Pain Score 07/20/20 1605 3     Pain Loc --      Pain Edu? --      Excl. in [place]? --    Vitals:   07/20/20 1843 07/20/20 1845  BP: (!) 148/79 (!) 142/88  Pulse: 88 95  Resp: 13 18  Temp:    SpO2: 100% 98%   Physical Exam Vitals and nursing note reviewed.  Constitutional:      General: She is not in acute distress.    Appearance: She is well-developed.  HENT:     Head: Normocephalic and atraumatic.     Right Ear: External ear normal.     Left Ear: External ear normal.     Nose: Nose normal.     Mouth/Throat:     Mouth: Mucous membranes are moist.  Eyes:     Conjunctiva/sclera: Conjunctivae normal.  Cardiovascular:     Rate and Rhythm: Normal rate and regular rhythm.     Heart sounds: No murmur heard.   Pulmonary:     Effort: Pulmonary effort is normal. No respiratory distress.     Breath sounds: Normal breath sounds.  Abdominal:     Palpations: Abdomen is soft.     Tenderness: There is no abdominal tenderness.  Musculoskeletal:     Cervical back: Neck supple.     Right lower leg: No edema.     Left lower leg: No edema.   Skin:    General: Skin is warm and dry.     Capillary Refill: Capillary refill takes less than 2 seconds.  Neurological:     Mental Status: She is alert and oriented to person, place, and time.  Psychiatric:        Mood and Affect: Mood normal.     Cranial nerves II through XII grossly intact.  No pronator drift.  No finger dysmetria.  Symmetric 5/5 strength of all extremities.  Sensation intact to light touch in all extremities.  Unremarkable unassisted gait.  ____________________________________________   LABS (all labs ordered are listed, but only abnormal results are displayed)  Labs Reviewed  BASIC METABOLIC PANEL - Abnormal; Notable for the following components:      Result Value   Sodium 134 (*)    Glucose, Bld 149 (*)    All other components within normal limits  CBC  BRAIN NATRIURETIC PEPTIDE  TROPONIN I (HIGH SENSITIVITY)   ____________________________________________  EKG  Sinus rhythm with a ventricular 95, normal axis, unremarkable intervals, isolated nonspecific T wave change in lead III with no other evidence of acute ischemia or other significant arrhythmia. ____________________________________________  RADIOLOGY  ED MD interpretation: Stable cardiomegaly without overt edema, effusion, pneumothorax, focal consolidation, widened mediastinum, or other acute thoracic process.  Official radiology report(s): DG Chest 2 View  Result Date: 07/20/2020 CLINICAL DATA:  54 year old with acute onset of shortness of breath, dizziness and chest pain. EXAM: CHEST - 2 VIEW COMPARISON:  07/15/2020 and earlier, including CTA chest 01/22/2019. FINDINGS: Cardiac silhouette mildly enlarged, unchanged. Hilar and mediastinal contours otherwise unremarkable. Lungs clear. Bronchovascular markings normal. Pulmonary vascularity normal. No visible pleural effusions. No pneumothorax. Visualized bony thorax unremarkable. IMPRESSION: Stable mild cardiomegaly. No acute cardiopulmonary  disease. Electronically Signed   By: Evangeline Dakin M.D.   On: 07/20/2020 16:47   CT Head Wo Contrast  Result Date: 07/20/2020 CLINICAL DATA:  Dizziness, recent CVA EXAM: CT HEAD WITHOUT CONTRAST TECHNIQUE: Contiguous axial images were obtained from the base of the skull through the vertex without intravenous contrast. COMPARISON:  07/15/2020 FINDINGS: Brain: Subtle hypodensity within the left corona radiata consistent with previous infarct. No signs of acute infarct or hemorrhage. The lateral ventricles and midline structures are unremarkable. No acute extra-axial fluid collections. No mass effect. Vascular: No hyperdense vessel or unexpected calcification. Skull: Normal. Negative for fracture or focal lesion. Sinuses/Orbits: No acute finding. Other: None. IMPRESSION: 1. Hypodensity left corona radiata consistent with previous infarct. 2. No acute intracranial process. Electronically Signed   By: Randa Ngo M.D.   On: 07/20/2020 16:55    ____________________________________________   PROCEDURES  Procedure(s) performed (including Critical Care):  .1-3 Lead EKG Interpretation Performed by: Lucrezia Starch, MD Authorized by: Lucrezia Starch, MD     Interpretation: normal     ECG rate assessment: normal     Rhythm: sinus rhythm     Ectopy: none     Conduction: normal       ____________________________________________   INITIAL IMPRESSION / ASSESSMENT AND PLAN / ED COURSE        Patient presents above-stated history exam for assessment of some lightheadedness has been intermittent since she was discharged from the hospital in early October.  Patient states she currently feels at baseline but is concerned because she does not know what is causing the symptoms.  On arrival patient is afebrile hemodynamically stable.  She has a nonfocal neuro exam and a rate of 98 on the cardiac monitor is in sinus rhythm.  Differential includes but is not limited to CVA, ACS, arrhythmia,  anemia, PE, metabolic derangements, heart failure exacerbation, dehydration, and pneumonia.  Very low suspicion for PE given episodic description of symptoms with no evidence of hypoxia, tachypnea, or any history of shortness of breath or chest pain.  No evidence of arrhythmia on ECG.  Low suspicion for ACS given patient denies any chest pain after routine EKG with nonelevated opponent and symptom onset greater than 3 hours prior to arrival.  CBC unremarkable without evidence of acute anemia.  CMP unremarkable for any evidence of significant electrolyte or metabolic derangements.  Low suspicion for heart failure exacerbation despite recent diagnosis with an EF of 30 to 35% noted on TTE obtained on 11/9 as patient does not appear grossly volume overloaded on exam and has no respiratory tantrums or evidence of overt edema on her chest x-ray.  Patient does not appear dehydrated as well today and she is not orthostatic.  Discussed patient's presentation work-up with on-call cardiologist Dr. Rayann Heman with some concern for possible arrhythmia contributing to presentation.  It is possible patient is having paroxysmal arrhythmia but I do not see an emergency room.  Given patient is currently asymptomatic with stable vital signs otherwise reassuring work-up we discussed discharge with plan for outpatient follow-up in the cardiology clinic tomorrow or patient can have a cardiac monitor placed.  I discussed this plan with the patient who agreed and stated she would avoid any driving or using any heavy machinery until an arrhythmia could be without her cardiologist.  Patient discharged stable condition per strict impressions advised discussed.  ____________________________________________   FINAL CLINICAL IMPRESSION(S) / ED DIAGNOSES  Final diagnoses:  Lightheaded    Medications - No data to display   ED Discharge Orders    None       Note:  This document was prepared using Dragon voice recognition  software and may include unintentional dictation errors.   Lucrezia Starch, MD 07/20/20 1859

## 2020-07-21 ENCOUNTER — Telehealth: Payer: Self-pay | Admitting: Internal Medicine

## 2020-07-21 NOTE — Telephone Encounter (Signed)
The patient already wore a monitor last month, which was notable only for a brief episode of Mobitz type 1 second-degree AV block. Beta blocker has since been discontinued. We will follow up as planned next week to discuss further work up.

## 2020-07-21 NOTE — Telephone Encounter (Signed)
Patient has already scheduled follow up with Dr End on 07/26/20. ED note from 07/20/20: Discussed patient's presentation work-up with on-call cardiologist Dr. Rayann Heman with some concern for possible arrhythmia contributing to presentation.  It is possible patient is having paroxysmal arrhythmia but I do not see an emergency room.  Given patient is currently asymptomatic with stable vital signs otherwise reassuring work-up we discussed discharge with plan for outpatient follow-up in the cardiology clinic tomorrow or patient can have a cardiac monitor placed.     Routing to Dr End for review and recommendations.

## 2020-07-21 NOTE — Telephone Encounter (Signed)
Patient was seen in the ed yesterday and per note a monitor was mentioned.    Patient calling to discuss and or schedule.    Please call.

## 2020-07-24 NOTE — Progress Notes (Signed)
Follow-up Outpatient Visit Date: 07/26/2020  Primary Care Provider: Theotis Burrow, MD [place] [id number]  Chief Complaint: Lightheadedness  HPI:  Stacey Richardson is a 54 y.o. female with history of recent strokes and concurrently diagnosed HFrEF, as well as hypertension, hyperlipidemia, type 2 diabetes mellitus, and obesity, who presents for follow-up of strokes and cardiomyopathy.  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  LVEF was still severely reduced.  She presented to the ED two days later complaining of lightheadedness, which has been a concern of hers over the last month.  She wore a monitor last month, which showed predominantly sinus rhythm and brief episodes of Wenckebach while on low-dose beta-blocker.  Today, Stacey Richardson is most concerned about continued episodes of lightheadedness. They are happening every other day on average, usually lasting about an hour. There are no clear precipitants, and they do not seem to be positional in nature. She feels as though she is not getting enough blood to her head and she is worried that she may pass out. This is led to several ED visits, as recently as yesterday (though she did not stay to be evaluated that time). She notes associated mild shortness of breath as well as occasional palpitations, though they do not occur consistently with her lightheadedness.  Stacey Richardson is also noted a few episodes of transient chest discomfort, that she describes as a pinch. It sometimes will wake her up at night, usually lasting only a few seconds. She tried walking 1/4 mile yesterday and was able to do this with only mild dyspnea and no chest pain or lightheadedness. She remains compliant with her medications. She is concerned that she may need to see a neurologist sooner than her currently scheduled follow-up in late  January. Stacey Richardson notes that her home blood pressures are typically in the 130's/70's.  She has been seen by her PCP since her hospitalization last month and was advised to see a behavioral health provider.  --------------------------------------------------------------------------------------------------  Past Medical History:  Diagnosis Date  . CVA (cerebral vascular accident) ([place])   . Diabetes mellitus   . HFrEF (heart failure with reduced ejection fraction) ([place])   . HLD (hyperlipidemia)   . Hypertension    Past Surgical History:  Procedure Laterality Date  . KNEE SURGERY    . TEE WITHOUT CARDIOVERSION N/A 07/18/2020   Procedure: TRANSESOPHAGEAL ECHOCARDIOGRAM (TEE);  Surgeon: [name], MD;  Location: [facility] ORS;  Service: Cardiovascular;  Laterality: N/A;    Current Meds  Medication Sig  . Acetaminophen-Caff-Pyrilamine (MIDOL COMPLETE) 500-60-15 MG TABS Take 1-2 tablets by mouth every 8 (eight) hours as needed (menstrual cramps).  Marland Kitchen aspirin EC 81 MG tablet Take 81 mg by mouth daily.   Marland Kitchen atorvastatin (LIPITOR) 80 MG tablet Take 80 mg by mouth at bedtime.  . carvedilol (COREG) 3.125 MG tablet Take 3.125 mg by mouth 2 (two) times daily.  . clopidogrel (PLAVIX) 75 MG tablet Take 1 tablet (75 mg total) by mouth daily.  . ferrous sulfate 325 (65 FE) MG tablet Take 325 mg by mouth 3 (three) times daily with meals.   Marland Kitchen losartan (COZAAR) 50 MG tablet Take 1 tablet (50 mg total) by mouth daily.  . metFORMIN (GLUCOPHAGE) 500 MG tablet Take 1,000 mg by mouth 2 (two) times daily with a meal.   . metFORMIN (GLUCOPHAGE-XR) 500 MG  24 hr tablet Take 1,000 mg by mouth 2 (two) times daily.  Marland Kitchen tetrahydrozoline 0.05 % ophthalmic solution Place 1-2 drops into both eyes 3 (three) times daily as needed (irritated eyes.).    Allergies: Peanut butter flavor and Lisinopril  Social History   Tobacco Use  . Smoking status: Former Research scientist (life sciences)  . Smokeless tobacco: Never Used  Vaping Use  . Vaping  Use: Never used  Substance Use Topics  . Alcohol use: Yes    Comment: social  . Drug use: No    Family History  Problem Relation Age of Onset  . Heart disease Mother        a. ?valve    Review of Systems: A 12-system review of systems was performed and was negative except as noted in the HPI.  --------------------------------------------------------------------------------------------------  Physical Exam: BP (!) 152/80 (BP Location: Left Arm, Patient Position: Sitting, Cuff Size: Normal)   Pulse 86   Ht 5\' 5"  (1.651 m)   Wt 192 lb (87.1 kg)   LMP 07/25/2020   SpO2 99%   BMI 31.95 kg/m    Position Blood pressure (mmHg) Heart rate (bpm)  Lying  145/86  84  Sitting  152/91  89  Standing  157/93  99  Standing (3 minutes)  154/96  99   General: NAD. Anxious appearing. Neck: No JVD or HJR. Lungs: Clear to auscultation bilateral without wheeze or crackles. Heart: Regular rate and rhythm without murmurs, rubs, or gallops. Abdomen: Soft, nontender, nondistended. Extremities: No lower extremity edema. 2+ radial pulses bilaterally.  EKG: Normal sinus rhythm without abnormality.  Lab Results  Component Value Date   WBC 7.7 07/25/2020   HGB 11.9 (L) 07/25/2020   HCT 37.3 07/25/2020   MCV 86.7 07/25/2020   PLT 311 07/25/2020    Lab Results  Component Value Date   NA 138 07/25/2020   K 3.9 07/25/2020   CL 105 07/25/2020   CO2 24 07/25/2020   BUN 10 07/25/2020   CREATININE 0.59 07/25/2020   GLUCOSE 227 (H) 07/25/2020   ALT 15 07/15/2020    Lab Results  Component Value Date   CHOL 293 (H) 06/13/2020   HDL 60 06/13/2020   LDLCALC 207 (H) 06/13/2020   TRIG 128 06/13/2020   CHOLHDL 4.9 06/13/2020    --------------------------------------------------------------------------------------------------  ASSESSMENT AND PLAN: Chronic HFrEF: Stacey Richardson appears euvolemic on exam today with NYHA class II symptoms.  Recent TEE confirmed LVEF 30-35%.  We will continue  losartan 50 mg daily; hopefully we can transition to [facility] in the future and potentially add spironolactone and empagliflozin in the next few weeks.  Beta blockers on hold at this time given brief episodes of Wenckebach noted on monitor last month.  We will proceed with right and left heart catheterization next week to exclude ischemic cardiomyopathy and to better understand her filling pressures and cardiac output.  I have reviewed the risks, indications, and alternatives to cardiac catheterization, possible angioplasty, and stenting with the patient. Risks include but are not limited to bleeding, infection, vascular injury, stroke, myocardial infection, arrhythmia, kidney injury, radiation-related injury in the case of prolonged fluoroscopy use, emergency cardiac surgery, and death. The patient understands the risks of serious complication is 1-2 in 3329 with diagnostic cardiac cath and 1-2% or less with angioplasty/stenting.  Cryptogenic stroke: No new neurologic deficits seen.  Recent TEE without evidence of intracardiac thrombus or shunt, though I remain somewhat suspicious for PFO given her age and recurrent strokes as well as possible tunnel  in the interatrial septum on TEE.  We will refer Stacey Richardson to Dr. Leonie Man for second opinion regarding her strokes as well as for transcranial Dopplers.  I will also refer her to EP for consideration of ILR.  We will continue aspirin, clopidogrel, and atorvastatin.  Hypertension: Blood pressure suboptimally controlled today but slightly better at home.  Borderline significant rise in heart rate during orthostatic VS today but no blood pressure drop.  We will continue losartan 50 mg daily without additional medication changes today.  BMP yesterday showed normal renal function and potassium.  Sodium restriction encouraged.  Lightheadedness: No clear evidence of orthostatic hypotension, though borderline significant rise in HR noted.  It is possible that anxiety is  also contributing to Stacey Richardson's random episodes of lightheadedness.  I have encouraged her to follow-up with her PCP and to consider his recommendation of consulting with a mental health provider.  Hyperlipidemia: Continue high-intensity statin therapy, with plan to repeat lipid panel in 4-6 weeks.  Follow-up: Return to clinic 2 weeks after cardiac catheterization.  [name], MD 07/26/2020 9:08 AM

## 2020-07-24 NOTE — H&P (View-Only) (Signed)
Follow-up Outpatient Visit Date: 07/26/2020  Primary Care Provider: Theotis Burrow, MD [place] [id number]  Chief Complaint: Lightheadedness  HPI:  Stacey Richardson is a 54 y.o. female with history of recent strokes and concurrently diagnosed HFrEF, as well as hypertension, hyperlipidemia, type 2 diabetes mellitus, and obesity, who presents for follow-up of strokes and cardiomyopathy.  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  LVEF was still severely reduced.  She presented to the ED two days later complaining of lightheadedness, which has been a concern of hers over the last month.  She wore a monitor last month, which showed predominantly sinus rhythm and brief episodes of Wenckebach while on low-dose beta-blocker.  Today, [name] is most concerned about continued episodes of lightheadedness. They are happening every other day on average, usually lasting about an hour. There are no clear precipitants, and they do not seem to be positional in nature. She feels as though she is not getting enough blood to her head and she is worried that she may pass out. This is led to several ED visits, as recently as yesterday (though she did not stay to be evaluated that time). She notes associated mild shortness of breath as well as occasional palpitations, though they do not occur consistently with her lightheadedness.  [name] is also noted a few episodes of transient chest discomfort, that she describes as a pinch. It sometimes will wake her up at night, usually lasting only a few seconds. She tried walking 1/4 mile yesterday and was able to do this with only mild dyspnea and no chest pain or lightheadedness. She remains compliant with her medications. She is concerned that she may need to see a neurologist sooner than her currently scheduled follow-up in late  January. [name] notes that her home blood pressures are typically in the 130's/70's.  She has been seen by her PCP since her hospitalization last month and was advised to see a behavioral health provider.  --------------------------------------------------------------------------------------------------  Past Medical History:  Diagnosis Date  . CVA (cerebral vascular accident) ([place])   . Diabetes mellitus   . HFrEF (heart failure with reduced ejection fraction) ([place])   . HLD (hyperlipidemia)   . Hypertension    Past Surgical History:  Procedure Laterality Date  . KNEE SURGERY    . TEE WITHOUT CARDIOVERSION N/A 07/18/2020   Procedure: TRANSESOPHAGEAL ECHOCARDIOGRAM (TEE);  Surgeon: [name], MD;  Location: [facility] ORS;  Service: Cardiovascular;  Laterality: N/A;    Current Meds  Medication Sig  . Acetaminophen-Caff-Pyrilamine (MIDOL COMPLETE) 500-60-15 MG TABS Take 1-2 tablets by mouth every 8 (eight) hours as needed (menstrual cramps).  Marland Kitchen aspirin EC 81 MG tablet Take 81 mg by mouth daily.   Marland Kitchen atorvastatin (LIPITOR) 80 MG tablet Take 80 mg by mouth at bedtime.  . carvedilol (COREG) 3.125 MG tablet Take 3.125 mg by mouth 2 (two) times daily.  . clopidogrel (PLAVIX) 75 MG tablet Take 1 tablet (75 mg total) by mouth daily.  . ferrous sulfate 325 (65 FE) MG tablet Take 325 mg by mouth 3 (three) times daily with meals.   Marland Kitchen losartan (COZAAR) 50 MG tablet Take 1 tablet (50 mg total) by mouth daily.  . metFORMIN (GLUCOPHAGE) 500 MG tablet Take 1,000 mg by mouth 2 (two) times daily with a meal.   . metFORMIN (GLUCOPHAGE-XR) 500 MG  24 hr tablet Take 1,000 mg by mouth 2 (two) times daily.  Marland Kitchen tetrahydrozoline 0.05 % ophthalmic solution Place 1-2 drops into both eyes 3 (three) times daily as needed (irritated eyes.).    Allergies: Peanut butter flavor and Lisinopril  Social History   Tobacco Use  . Smoking status: Former Research scientist (life sciences)  . Smokeless tobacco: Never Used  Vaping Use  . Vaping  Use: Never used  Substance Use Topics  . Alcohol use: Yes    Comment: social  . Drug use: No    Family History  Problem Relation Age of Onset  . Heart disease Mother        a. ?valve    Review of Systems: A 12-system review of systems was performed and was negative except as noted in the HPI.  --------------------------------------------------------------------------------------------------  Physical Exam: BP (!) 152/80 (BP Location: Left Arm, Patient Position: Sitting, Cuff Size: Normal)   Pulse 86   Ht 5\' 5"  (1.651 m)   Wt 192 lb (87.1 kg)   LMP 07/25/2020   SpO2 99%   BMI 31.95 kg/m    Position Blood pressure (mmHg) Heart rate (bpm)  Lying  145/86  84  Sitting  152/91  89  Standing  157/93  99  Standing (3 minutes)  154/96  99   General: NAD. Anxious appearing. Neck: No JVD or HJR. Lungs: Clear to auscultation bilateral without wheeze or crackles. Heart: Regular rate and rhythm without murmurs, rubs, or gallops. Abdomen: Soft, nontender, nondistended. Extremities: No lower extremity edema. 2+ radial pulses bilaterally.  EKG: Normal sinus rhythm without abnormality.  Lab Results  Component Value Date   WBC 7.7 07/25/2020   HGB 11.9 (L) 07/25/2020   HCT 37.3 07/25/2020   MCV 86.7 07/25/2020   PLT 311 07/25/2020    Lab Results  Component Value Date   NA 138 07/25/2020   K 3.9 07/25/2020   CL 105 07/25/2020   CO2 24 07/25/2020   BUN 10 07/25/2020   CREATININE 0.59 07/25/2020   GLUCOSE 227 (H) 07/25/2020   ALT 15 07/15/2020    Lab Results  Component Value Date   CHOL 293 (H) 06/13/2020   HDL 60 06/13/2020   LDLCALC 207 (H) 06/13/2020   TRIG 128 06/13/2020   CHOLHDL 4.9 06/13/2020    --------------------------------------------------------------------------------------------------  ASSESSMENT AND PLAN: Chronic HFrEF: [name] appears euvolemic on exam today with NYHA class II symptoms.  Recent TEE confirmed LVEF 30-35%.  We will continue  losartan 50 mg daily; hopefully we can transition to [facility] in the future and potentially add spironolactone and empagliflozin in the next few weeks.  Beta blockers on hold at this time given brief episodes of Wenckebach noted on monitor last month.  We will proceed with right and left heart catheterization next week to exclude ischemic cardiomyopathy and to better understand her filling pressures and cardiac output.  I have reviewed the risks, indications, and alternatives to cardiac catheterization, possible angioplasty, and stenting with the patient. Risks include but are not limited to bleeding, infection, vascular injury, stroke, myocardial infection, arrhythmia, kidney injury, radiation-related injury in the case of prolonged fluoroscopy use, emergency cardiac surgery, and death. The patient understands the risks of serious complication is 1-2 in 5366 with diagnostic cardiac cath and 1-2% or less with angioplasty/stenting.  Cryptogenic stroke: No new neurologic deficits seen.  Recent TEE without evidence of intracardiac thrombus or shunt, though I remain somewhat suspicious for PFO given her age and recurrent strokes as well as possible tunnel  in the interatrial septum on TEE.  We will refer Ms. Boykin Reaper to Dr. Leonie Man for second opinion regarding her strokes as well as for transcranial Dopplers.  I will also refer her to EP for consideration of ILR.  We will continue aspirin, clopidogrel, and atorvastatin.  Hypertension: Blood pressure suboptimally controlled today but slightly better at home.  Borderline significant rise in heart rate during orthostatic VS today but no blood pressure drop.  We will continue losartan 50 mg daily without additional medication changes today.  BMP yesterday showed normal renal function and potassium.  Sodium restriction encouraged.  Lightheadedness: No clear evidence of orthostatic hypotension, though borderline significant rise in HR noted.  It is possible that anxiety is  also contributing to Ms. Gellis's random episodes of lightheadedness.  I have encouraged her to follow-up with her PCP and to consider his recommendation of consulting with a mental health provider.  Hyperlipidemia: Continue high-intensity statin therapy, with plan to repeat lipid panel in 4-6 weeks.  Follow-up: Return to clinic 2 weeks after cardiac catheterization.  [name], MD 07/26/2020 9:08 AM

## 2020-07-24 NOTE — Telephone Encounter (Signed)
Patient notified and verbalized understanding of Dr Darnelle Bos recommendations and plan of care.

## 2020-07-25 ENCOUNTER — Other Ambulatory Visit: Payer: Self-pay

## 2020-07-25 ENCOUNTER — Emergency Department
Admission: EM | Admit: 2020-07-25 | Discharge: 2020-07-25 | Disposition: A | Discharge status: Home / Self Care | Payer: 59 | Attending: Emergency Medicine | Admitting: Emergency Medicine

## 2020-07-25 ENCOUNTER — Emergency Department: Payer: 59

## 2020-07-25 DIAGNOSIS — R42 Dizziness and giddiness: Secondary | ICD-10-CM | POA: Insufficient documentation

## 2020-07-25 DIAGNOSIS — Z5321 Procedure and treatment not carried out due to patient leaving prior to being seen by health care provider: Secondary | ICD-10-CM | POA: Insufficient documentation

## 2020-07-25 DIAGNOSIS — R000 Tachycardia, unspecified: Secondary | ICD-10-CM | POA: Insufficient documentation

## 2020-07-25 LAB — BASIC METABOLIC PANEL
Anion gap: 9 (ref 5–15)
BUN: 10 mg/dL (ref 6–20)
CO2: 24 mmol/L (ref 22–32)
Calcium: 8.7 mg/dL — ABNORMAL LOW (ref 8.9–10.3)
Chloride: 105 mmol/L (ref 98–111)
Creatinine, Ser: 0.59 mg/dL (ref 0.44–1.00)
GFR, Estimated: >60 mL/min (ref >60)
Glucose, Bld: 227 mg/dL — ABNORMAL HIGH (ref 70–99)
Potassium: 3.9 mmol/L (ref 3.5–5.1)
Sodium: 138 mmol/L (ref 135–145)

## 2020-07-25 LAB — CBC
HCT: 37.3 % (ref 36.0–46.0)
Hemoglobin: 11.9 g/dL — ABNORMAL LOW (ref 12.0–15.0)
MCH: 27.7 pg (ref 26.0–34.0)
MCHC: 31.9 g/dL (ref 30.0–36.0)
MCV: 86.7 fL (ref 80.0–100.0)
Platelets: 311 10*3/uL (ref 150–400)
RBC: 4.3 MIL/uL (ref 3.87–5.11)
RDW: 13.1 % (ref 11.5–15.5)
WBC: 7.7 10*3/uL (ref 4.0–10.5)
nRBC: 0 % (ref 0.0–0.2)

## 2020-07-25 NOTE — ED Triage Notes (Signed)
Pt reports that today when she walked a lap she started having dizziness, states she got home and checked her pulse and found it as 112, pt reports that she is concerned due to her cardiologist doing a TEE procedure and told she had a weak heart

## 2020-07-26 ENCOUNTER — Encounter: Payer: Self-pay | Admitting: Internal Medicine

## 2020-07-26 ENCOUNTER — Ambulatory Visit: Payer: 59 | Admitting: Internal Medicine

## 2020-07-26 VITALS — BP 152/80 | HR 86 | Ht 65.0 in | Wt 192.0 lb

## 2020-07-26 DIAGNOSIS — I5022 Chronic systolic (congestive) heart failure: Secondary | ICD-10-CM | POA: Insufficient documentation

## 2020-07-26 DIAGNOSIS — E785 Hyperlipidemia, unspecified: Secondary | ICD-10-CM

## 2020-07-26 DIAGNOSIS — R42 Dizziness and giddiness: Secondary | ICD-10-CM | POA: Diagnosis not present

## 2020-07-26 DIAGNOSIS — I10 Essential (primary) hypertension: Secondary | ICD-10-CM

## 2020-07-26 DIAGNOSIS — I639 Cerebral infarction, unspecified: Secondary | ICD-10-CM | POA: Diagnosis not present

## 2020-07-26 NOTE — Patient Instructions (Addendum)
Medication Instructions:  Your physician recommends that you continue on your current medications as directed. Please refer to the Current Medication list given to you today.  *If you need a refill on your cardiac medications before your next appointment, please call your pharmacy*  Lab Work: COVID PRE- TEST: You will need a COVID TEST prior to the procedure:  LOCATION: [address] [facility] site.  DATE/TIME:  On July 28, 2020 anytime between 7 am and 1 pm.   If you have labs (blood work) drawn today and your tests are completely normal, you will receive your results only by: Marland Kitchen MyChart Message (if you have MyChart) OR . A paper copy in the mail If you have any lab test that is abnormal or we need to change your treatment, we will call you to review the results.  Testing/Procedures:  You are scheduled for a Left and Right Cardiac Catheterization on Tuesday, November 23 with Dr. Harrell Gave End.  1. Please arrive at the [facility] at 8:30 AM (This time is one hour before your procedure to ensure your preparation). Free valet parking service is available.   Special note: Every effort is made to have your procedure done on time. Please understand that emergencies sometimes delay scheduled procedures.  2. Diet: Do not eat solid foods after midnight.  The patient may have clear liquids until 5am upon the day of the procedure.  3. Labs:  None  COVID PRE- TEST: You will need a COVID TEST prior to the procedure:  LOCATION: [address] [facility] Testing site.  DATE/TIME:  Friday, July 28, 2020 anytime between 7 am and 6 pm.  4. Medication instructions in preparation for your procedure:   Contrast Allergy: No  Do not take Diabetes Med Glucophage (Metformin) on the day of the procedure and HOLD 48 HOURS AFTER THE PROCEDURE.  On the morning of your procedure, take your Plavix/Clopidogrel and any morning medicines NOT listed above.  You may use  sips of water.  5. Plan for one night stay--bring personal belongings. 6. Bring a current list of your medications and current insurance cards. 7. You MUST have a responsible person to drive you home. 8. Someone MUST be with you the first 24 hours after you arrive home or your discharge will be delayed. 9. Please wear clothes that are easy to get on and off and wear slip-on shoes.  Thank you for allowing [place] to care for you!   -- [facility]    Follow-Up: At [facility], you and your health needs are our priority.  As part of our continuing mission to provide you with exceptional heart care, we have created designated Provider Care Teams.  These Care Teams include your primary Cardiologist (physician) and Advanced Practice Providers (APPs -  Physician Assistants and Nurse Practitioners) who all work together to provide you with the care you need, when you need it.  We recommend signing up for the patient portal called "MyChart".  Sign up information is provided on this After Visit Summary.  MyChart is used to connect with patients for Virtual Visits (Telemedicine).  Patients are able to view lab/test results, encounter notes, upcoming appointments, etc.  Non-urgent messages can be sent to your provider as well.   To learn more about what you can do with MyChart, go to [email or web address].    Your next appointment:   2 week(s) after cath  The format for your next appointment:   In  Person  Provider:   You may see DR Harrell Gave END or one of the following Advanced Practice Providers on your designated Care Team:    Murray Hodgkins, NP  Christell Faith, PA-C  Marrianne Mood, PA-C  Cadence Jasper, [place]  Laurann Montana, NP   You have been referred to Electrophysiology here in [place].   You have been referred to [facility] to be seen for recurrent strokes, transcranial doppler needed. Please call the number listed under the referral  to schedule an appointment.

## 2020-07-28 ENCOUNTER — Other Ambulatory Visit: Payer: Self-pay

## 2020-07-28 ENCOUNTER — Other Ambulatory Visit
Admission: RE | Admit: 2020-07-28 | Discharge: 2020-07-28 | Disposition: A | Discharge status: Home / Self Care | Payer: 59 | Source: Ambulatory Visit | Attending: Internal Medicine | Admitting: Internal Medicine

## 2020-07-28 DIAGNOSIS — Z01812 Encounter for preprocedural laboratory examination: Secondary | ICD-10-CM | POA: Insufficient documentation

## 2020-07-28 DIAGNOSIS — Z20822 Contact with and (suspected) exposure to covid-19: Secondary | ICD-10-CM | POA: Diagnosis not present

## 2020-07-29 LAB — SARS CORONAVIRUS 2 (TAT 6-24 HRS): SARS Coronavirus 2: NEGATIVE

## 2020-07-31 ENCOUNTER — Telehealth: Payer: Self-pay | Admitting: *Deleted

## 2020-07-31 DIAGNOSIS — I639 Cerebral infarction, unspecified: Secondary | ICD-10-CM

## 2020-07-31 NOTE — Telephone Encounter (Signed)
Stacey Richardson  Nara Paternoster, Eliberto Ivory, RN Dr. Leonie Man reviewed this referral and advised that it looked like the patient needs TCD Bubble Study which is done at [facility]. He advised that the patients cardiologist needs to order this. He did advise that if you want the patient seen as a new consult for cryptogenic stroke he would be happy to do so, however we need clarification as to what the patient is being referred for.  -------------------------------------------------------------------------------------  Order for TCD with Bubble entered. Message sent to precert. Await precert and then I will schedule.

## 2020-08-01 DIAGNOSIS — I509 Heart failure, unspecified: Secondary | ICD-10-CM

## 2020-08-02 ENCOUNTER — Encounter: Payer: Self-pay | Admitting: Internal Medicine

## 2020-08-02 ENCOUNTER — Ambulatory Visit: Payer: 59 | Admitting: Family

## 2020-08-02 ENCOUNTER — Inpatient Hospital Stay (HOSPITAL_COMMUNITY)
Admission: AD | Admit: 2020-08-02 | Discharge: 2020-08-11 | DRG: 236 | Disposition: A | Discharge status: Home / Self Care | Payer: 59 | Source: Other Acute Inpatient Hospital | Attending: Cardiology | Admitting: Cardiology

## 2020-08-02 ENCOUNTER — Other Ambulatory Visit: Payer: Self-pay

## 2020-08-02 ENCOUNTER — Inpatient Hospital Stay
Admission: RE | Admit: 2020-08-02 | Discharge: 2020-08-02 | DRG: 287 | Disposition: A | Discharge status: Other Acute Inpatient Hospital | Payer: 59 | Attending: Internal Medicine | Admitting: Internal Medicine

## 2020-08-02 ENCOUNTER — Encounter: Payer: Self-pay | Admitting: Cardiology

## 2020-08-02 ENCOUNTER — Other Ambulatory Visit: Payer: Self-pay | Admitting: *Deleted

## 2020-08-02 ENCOUNTER — Encounter
Admission: RE | Disposition: A | Discharge status: Other Acute Inpatient Hospital | Payer: Self-pay | Source: Home / Self Care | Attending: Internal Medicine

## 2020-08-02 DIAGNOSIS — I110 Hypertensive heart disease with heart failure: Secondary | ICD-10-CM | POA: Diagnosis present

## 2020-08-02 DIAGNOSIS — I959 Hypotension, unspecified: Secondary | ICD-10-CM | POA: Diagnosis not present

## 2020-08-02 DIAGNOSIS — Z8249 Family history of ischemic heart disease and other diseases of the circulatory system: Secondary | ICD-10-CM | POA: Diagnosis not present

## 2020-08-02 DIAGNOSIS — E8779 Other fluid overload: Secondary | ICD-10-CM | POA: Diagnosis not present

## 2020-08-02 DIAGNOSIS — E1159 Type 2 diabetes mellitus with other circulatory complications: Secondary | ICD-10-CM

## 2020-08-02 DIAGNOSIS — I639 Cerebral infarction, unspecified: Secondary | ICD-10-CM | POA: Diagnosis not present

## 2020-08-02 DIAGNOSIS — Z951 Presence of aortocoronary bypass graft: Secondary | ICD-10-CM

## 2020-08-02 DIAGNOSIS — I441 Atrioventricular block, second degree: Secondary | ICD-10-CM | POA: Diagnosis present

## 2020-08-02 DIAGNOSIS — Z7984 Long term (current) use of oral hypoglycemic drugs: Secondary | ICD-10-CM | POA: Diagnosis not present

## 2020-08-02 DIAGNOSIS — I2510 Atherosclerotic heart disease of native coronary artery without angina pectoris: Principal | ICD-10-CM | POA: Diagnosis present

## 2020-08-02 DIAGNOSIS — Z955 Presence of coronary angioplasty implant and graft: Secondary | ICD-10-CM

## 2020-08-02 DIAGNOSIS — Z09 Encounter for follow-up examination after completed treatment for conditions other than malignant neoplasm: Secondary | ICD-10-CM

## 2020-08-02 DIAGNOSIS — E785 Hyperlipidemia, unspecified: Secondary | ICD-10-CM | POA: Diagnosis present

## 2020-08-02 DIAGNOSIS — Z8673 Personal history of transient ischemic attack (TIA), and cerebral infarction without residual deficits: Secondary | ICD-10-CM

## 2020-08-02 DIAGNOSIS — I509 Heart failure, unspecified: Secondary | ICD-10-CM

## 2020-08-02 DIAGNOSIS — Z7982 Long term (current) use of aspirin: Secondary | ICD-10-CM

## 2020-08-02 DIAGNOSIS — E119 Type 2 diabetes mellitus without complications: Secondary | ICD-10-CM

## 2020-08-02 DIAGNOSIS — R1310 Dysphagia, unspecified: Secondary | ICD-10-CM | POA: Diagnosis present

## 2020-08-02 DIAGNOSIS — I5022 Chronic systolic (congestive) heart failure: Secondary | ICD-10-CM

## 2020-08-02 DIAGNOSIS — I5020 Unspecified systolic (congestive) heart failure: Secondary | ICD-10-CM | POA: Diagnosis present

## 2020-08-02 DIAGNOSIS — Z79899 Other long term (current) drug therapy: Secondary | ICD-10-CM | POA: Diagnosis not present

## 2020-08-02 DIAGNOSIS — I10 Essential (primary) hypertension: Secondary | ICD-10-CM | POA: Diagnosis present

## 2020-08-02 DIAGNOSIS — D62 Acute posthemorrhagic anemia: Secondary | ICD-10-CM | POA: Diagnosis not present

## 2020-08-02 DIAGNOSIS — Z91010 Allergy to peanuts: Secondary | ICD-10-CM

## 2020-08-02 DIAGNOSIS — I69391 Dysphagia following cerebral infarction: Secondary | ICD-10-CM

## 2020-08-02 DIAGNOSIS — Z87891 Personal history of nicotine dependence: Secondary | ICD-10-CM

## 2020-08-02 DIAGNOSIS — Z7902 Long term (current) use of antithrombotics/antiplatelets: Secondary | ICD-10-CM | POA: Diagnosis not present

## 2020-08-02 DIAGNOSIS — Z888 Allergy status to other drugs, medicaments and biological substances status: Secondary | ICD-10-CM

## 2020-08-02 DIAGNOSIS — I080 Rheumatic disorders of both mitral and aortic valves: Secondary | ICD-10-CM | POA: Diagnosis present

## 2020-08-02 DIAGNOSIS — E669 Obesity, unspecified: Secondary | ICD-10-CM | POA: Diagnosis present

## 2020-08-02 DIAGNOSIS — Z6831 Body mass index (BMI) 31.0-31.9, adult: Secondary | ICD-10-CM | POA: Diagnosis not present

## 2020-08-02 DIAGNOSIS — I255 Ischemic cardiomyopathy: Secondary | ICD-10-CM | POA: Diagnosis not present

## 2020-08-02 DIAGNOSIS — I25110 Atherosclerotic heart disease of native coronary artery with unstable angina pectoris: Secondary | ICD-10-CM | POA: Diagnosis not present

## 2020-08-02 DIAGNOSIS — Z01810 Encounter for preprocedural cardiovascular examination: Secondary | ICD-10-CM | POA: Diagnosis not present

## 2020-08-02 DIAGNOSIS — I429 Cardiomyopathy, unspecified: Secondary | ICD-10-CM

## 2020-08-02 DIAGNOSIS — Z9889 Other specified postprocedural states: Secondary | ICD-10-CM

## 2020-08-02 DIAGNOSIS — R42 Dizziness and giddiness: Secondary | ICD-10-CM | POA: Diagnosis not present

## 2020-08-02 HISTORY — DX: Atherosclerotic heart disease of native coronary artery without angina pectoris: I25.10

## 2020-08-02 HISTORY — PX: RIGHT/LEFT HEART CATH AND CORONARY ANGIOGRAPHY: CATH118266

## 2020-08-02 LAB — CBC
HCT: 32.3 % — ABNORMAL LOW (ref 36.0–46.0)
Hemoglobin: 10.2 g/dL — ABNORMAL LOW (ref 12.0–15.0)
MCH: 27.2 pg (ref 26.0–34.0)
MCHC: 31.6 g/dL (ref 30.0–36.0)
MCV: 86.1 fL (ref 80.0–100.0)
Platelets: 377 10*3/uL (ref 150–400)
RBC: 3.75 MIL/uL — ABNORMAL LOW (ref 3.87–5.11)
RDW: 13.1 % (ref 11.5–15.5)
WBC: 7.9 10*3/uL (ref 4.0–10.5)
nRBC: 0 % (ref 0.0–0.2)

## 2020-08-02 LAB — CREATININE, SERUM
Creatinine, Ser: 0.61 mg/dL (ref 0.44–1.00)
GFR, Estimated: >60 mL/min (ref >60)

## 2020-08-02 LAB — PREGNANCY, URINE: Preg Test, Ur: NEGATIVE

## 2020-08-02 LAB — GLUCOSE, CAPILLARY
Glucose-Capillary: 134 mg/dL — ABNORMAL HIGH (ref 70–99)
Glucose-Capillary: 174 mg/dL — ABNORMAL HIGH (ref 70–99)
Glucose-Capillary: 143 mg/dL — ABNORMAL HIGH (ref 70–99)
Glucose-Capillary: 184 mg/dL — ABNORMAL HIGH (ref 70–99)

## 2020-08-02 LAB — POCT ACTIVATED CLOTTING TIME: Activated Clotting Time: 175 seconds

## 2020-08-02 SURGERY — RIGHT/LEFT HEART CATH AND CORONARY ANGIOGRAPHY
Anesthesia: Moderate Sedation | Laterality: Bilateral

## 2020-08-02 MED ORDER — MIDAZOLAM HCL 2 MG/2ML IJ SOLN
INTRAMUSCULAR | Status: AC
  Filled 2020-08-02: qty 2

## 2020-08-02 MED ORDER — FERROUS SULFATE 325 (65 FE) MG PO TABS
325.0000 mg | ORAL_TABLET | Freq: Three times a day (TID) | ORAL | Status: DC
  Administered 2020-08-03 – 2020-08-07 (×10): 325 mg via ORAL
  Filled 2020-08-02 (×10): qty 1

## 2020-08-02 MED ORDER — NITROGLYCERIN 0.4 MG SL SUBL
0.4000 mg | SUBLINGUAL_TABLET | SUBLINGUAL | Status: DC | PRN
  Filled 2020-08-02: qty 1

## 2020-08-02 MED ORDER — IOHEXOL 300 MG/ML  SOLN
INTRAMUSCULAR | Status: DC | PRN
  Administered 2020-08-02: 65 mL

## 2020-08-02 MED ORDER — HEPARIN SODIUM (PORCINE) 1000 UNIT/ML IJ SOLN
INTRAMUSCULAR | Status: DC | PRN
  Administered 2020-08-02: 4500 [IU] via INTRAVENOUS

## 2020-08-02 MED ORDER — SODIUM CHLORIDE 0.9% FLUSH: 3.0000 mL | INTRAVENOUS | Status: DC | PRN

## 2020-08-02 MED ORDER — FENTANYL CITRATE (PF) 100 MCG/2ML IJ SOLN
INTRAMUSCULAR | Status: AC
  Filled 2020-08-02: qty 2

## 2020-08-02 MED ORDER — INSULIN ASPART 100 UNIT/ML ~~LOC~~ SOLN
0.0000 [IU] | SUBCUTANEOUS | Status: DC
  Administered 2020-08-02: 4 [IU] via SUBCUTANEOUS

## 2020-08-02 MED ORDER — ASPIRIN EC 81 MG PO TBEC
81.0000 mg | DELAYED_RELEASE_TABLET | Freq: Every day | ORAL | Status: DC
  Administered 2020-08-03 – 2020-08-05 (×3): 81 mg via ORAL
  Filled 2020-08-02 (×3): qty 1

## 2020-08-02 MED ORDER — SODIUM CHLORIDE 0.9 % IV SOLN: 250.0000 mL | INTRAVENOUS | Status: DC | PRN

## 2020-08-02 MED ORDER — NAPHAZOLINE-GLYCERIN 0.012-0.2 % OP SOLN: 1.0000 [drp] | Freq: Four times a day (QID) | OPHTHALMIC | Status: DC | PRN

## 2020-08-02 MED ORDER — HEPARIN SODIUM (PORCINE) 5000 UNIT/ML IJ SOLN
5000.0000 [IU] | Freq: Three times a day (TID) | INTRAMUSCULAR | Status: DC
  Administered 2020-08-02 – 2020-08-06 (×11): 5000 [IU] via SUBCUTANEOUS
  Filled 2020-08-02 (×12): qty 1

## 2020-08-02 MED ORDER — VERAPAMIL HCL 2.5 MG/ML IV SOLN
INTRAVENOUS | Status: AC
  Filled 2020-08-02: qty 2

## 2020-08-02 MED ORDER — FENTANYL CITRATE (PF) 100 MCG/2ML IJ SOLN
INTRAMUSCULAR | Status: DC | PRN
  Administered 2020-08-02 (×2): 25 ug via INTRAVENOUS

## 2020-08-02 MED ORDER — MELATONIN 3 MG PO TABS
6.0000 mg | ORAL_TABLET | Freq: Every evening | ORAL | Status: DC | PRN
  Administered 2020-08-02 – 2020-08-05 (×3): 6 mg via ORAL
  Filled 2020-08-02 (×4): qty 2

## 2020-08-02 MED ORDER — ONDANSETRON HCL 4 MG/2ML IJ SOLN: 4.0000 mg | Freq: Four times a day (QID) | INTRAMUSCULAR | Status: DC | PRN

## 2020-08-02 MED ORDER — SODIUM CHLORIDE 0.9% FLUSH: 3.0000 mL | Freq: Two times a day (BID) | INTRAVENOUS | Status: DC

## 2020-08-02 MED ORDER — LIDOCAINE HCL (PF) 1 % IJ SOLN
INTRAMUSCULAR | Status: DC | PRN
  Administered 2020-08-02: 5 mL
  Administered 2020-08-02: 12 mL

## 2020-08-02 MED ORDER — SODIUM CHLORIDE 0.9 % IV SOLN: INTRAVENOUS | Status: DC

## 2020-08-02 MED ORDER — ATORVASTATIN CALCIUM 80 MG PO TABS
80.0000 mg | ORAL_TABLET | Freq: Every day | ORAL | Status: DC
  Administered 2020-08-02 – 2020-08-11 (×9): 80 mg via ORAL
  Filled 2020-08-02 (×9): qty 1

## 2020-08-02 MED ORDER — HEPARIN (PORCINE) IN NACL 1000-0.9 UT/500ML-% IV SOLN
INTRAVENOUS | Status: AC
  Filled 2020-08-02: qty 1000

## 2020-08-02 MED ORDER — HEPARIN (PORCINE) IN NACL 2000-0.9 UNIT/L-% IV SOLN
INTRAVENOUS | Status: DC | PRN
  Administered 2020-08-02: 1000 mL

## 2020-08-02 MED ORDER — NITROGLYCERIN 1 MG/10 ML FOR IR/CATH LAB
INTRA_ARTERIAL | Status: AC
  Filled 2020-08-02: qty 10

## 2020-08-02 MED ORDER — LOSARTAN POTASSIUM 50 MG PO TABS
50.0000 mg | ORAL_TABLET | Freq: Every day | ORAL | Status: DC
  Administered 2020-08-02 – 2020-08-05 (×4): 50 mg via ORAL
  Filled 2020-08-02 (×4): qty 1

## 2020-08-02 MED ORDER — LIDOCAINE HCL (PF) 1 % IJ SOLN
INTRAMUSCULAR | Status: AC
  Filled 2020-08-02: qty 30

## 2020-08-02 MED ORDER — HEPARIN SODIUM (PORCINE) 1000 UNIT/ML IJ SOLN
INTRAMUSCULAR | Status: AC
  Filled 2020-08-02: qty 1

## 2020-08-02 MED ORDER — NITROGLYCERIN 1 MG/10 ML FOR IR/CATH LAB
INTRA_ARTERIAL | Status: DC | PRN
  Administered 2020-08-02: 200 ug

## 2020-08-02 MED ORDER — VERAPAMIL HCL 2.5 MG/ML IV SOLN
INTRAVENOUS | Status: DC | PRN
  Administered 2020-08-02: 2.5 mg via INTRA_ARTERIAL

## 2020-08-02 MED ORDER — ASPIRIN 81 MG PO CHEW
CHEWABLE_TABLET | ORAL | Status: AC
  Filled 2020-08-02: qty 1

## 2020-08-02 MED ORDER — MIDAZOLAM HCL 2 MG/2ML IJ SOLN
INTRAMUSCULAR | Status: DC | PRN
  Administered 2020-08-02: 1 mg via INTRAVENOUS
  Administered 2020-08-02: 2 mg via INTRAVENOUS

## 2020-08-02 MED ORDER — ACETAMINOPHEN 325 MG PO TABS
650.0000 mg | ORAL_TABLET | ORAL | Status: DC | PRN
  Administered 2020-08-02: 650 mg via ORAL
  Filled 2020-08-02: qty 2

## 2020-08-02 MED ORDER — ASPIRIN 81 MG PO CHEW: 81.0000 mg | CHEWABLE_TABLET | ORAL | Status: DC

## 2020-08-02 SURGICAL SUPPLY — 19 items
CANNULA 5F STIFF (CANNULA) ×2 IMPLANT
CATH 5F 110X4 TIG (CATHETERS) ×2 IMPLANT
CATH BALLN WEDGE 5F 110CM (CATHETERS) ×2 IMPLANT
CATH INFINITI 5FR JL4 (CATHETERS) ×2 IMPLANT
CATH INFINITI JR4 5F (CATHETERS) ×2 IMPLANT
DEVICE RAD TR BAND REGULAR (VASCULAR PRODUCTS) ×2 IMPLANT
GLIDESHEATH SLEND SS 6F .021 (SHEATH) ×2 IMPLANT
GUIDEWIRE ANGLED .035 180CM (WIRE) ×2 IMPLANT
GUIDEWIRE INQWIRE 1.5J.035X260 (WIRE) ×1 IMPLANT
INQWIRE 1.5J .035X260CM (WIRE) ×2
KIT MANI 3VAL PERCEP (MISCELLANEOUS) ×2 IMPLANT
NEEDLE PERC 18GX7CM (NEEDLE) ×2 IMPLANT
PACK CARDIAC CATH (CUSTOM PROCEDURE TRAY) ×2 IMPLANT
SHEATH AVANTI 5FR X 11CM (SHEATH) ×2 IMPLANT
SHEATH GLIDE SLENDER 4/5FR (SHEATH) ×2 IMPLANT
VALVE COPILOT STAT (MISCELLANEOUS) ×2 IMPLANT
WIRE G HI TQ BMW 190 (WIRE) ×2 IMPLANT
WIRE GUIDERIGHT .035X150 (WIRE) ×2 IMPLANT
WIRE HITORQ VERSACORE ST 145CM (WIRE) ×2 IMPLANT

## 2020-08-02 NOTE — Interval H&P Note (Signed)
History and Physical Interval Note:  08/02/2020 7:23 AM  Stacey Richardson  has presented today for surgery, with the diagnosis of chronic HFrEF.  The various methods of treatment have been discussed with the patient and family. After consideration of risks, benefits and other options for treatment, the patient has consented to  Procedure(s): RIGHT/LEFT HEART CATH AND CORONARY ANGIOGRAPHY (Bilateral) as a surgical intervention.  The patient's history has been reviewed, patient examined, no change in status, stable for surgery.  I have reviewed the patient's chart and labs.  Questions were answered to the patient's satisfaction.    Cath Lab Visit (complete for each Cath Lab visit)  Clinical Evaluation Leading to the Procedure:   ACS: No.  Non-ACS:    Anginal/Heart Failure Classification: NYHA III  Anti-ischemic medical therapy: No Therapy  Non-Invasive Test Results: No non-invasive testing performed (LVEF < 35% -> high risk)  Prior CABG: No previous CABG  Rolla Servidio

## 2020-08-02 NOTE — Progress Notes (Signed)
Patient awake/alet x4  Very nervous. Dr. Saunders Revel spoke to patient pre-op at length, questions asked and answered. Lungs CTA  No c/o's

## 2020-08-02 NOTE — Progress Notes (Signed)
Patient tolerated sandwich/gingerale without event. TR band off, right radial site c/d/i, +CMS to digits/hand/radial site.  Right femoral site dressing c/d/i, no s/s hematoma noted.  Pulses intact. Ambulated to bathroom without event. New PIV placed left hand #20g, 22g PIV removed LAC, "leaking" No c/o's at this time, waiting on bed assignment at [facility].

## 2020-08-02 NOTE — Progress Notes (Signed)
Report given to [facility].  Report given to RN Ayaba at [facility]: 6E. Patient aware, updated family.  No c/o's at this time.

## 2020-08-02 NOTE — Progress Notes (Signed)
Patient awake/alert, tearful, "I'm worried about surgery" Dr. Saunders Revel spoke to patient post procedure. Patient to be transferred to [facility] for further eval:  High risk PCI vs CABG. Patient aware, speaking to family. Last dose of Plavix yesterday am. No c/o's pain, shortness of breath.

## 2020-08-02 NOTE — H&P (Addendum)
Cardiology Admission History and Physical:   Patient ID: Stacey Richardson MRN: [id number]; DOB: May 12, 1966   Admission date: 08/02/2020  Primary Care Provider: Theotis Burrow, MD [facility] Cardiologist: [name], MD  [facility] Electrophysiologist:  None   Chief Complaint: Transfer from [facility] secondary to left main stenosis  Patient Profile:   Stacey Richardson is a 54 y.o. female with recent cryptogenic recurrent strokes with concurrently diagnosed HFrEF, DM2, HTN, HLD, chronic dizziness, and obesity who presented to [facility] on 08/02/2020 for [facility].  History of Present Illness:   Stacey Richardson was admitted to the hospital in early 06/2020 with speech difficulty and dysphagia.  She was found to have a left frontal lobe infarct.  Echo on 06/13/2020 showed an EF of 35 to 40%, mildly dilated LV cavity size, mild LVH, normal RV systolic function and ventricular cavity size, and mild to moderate mitral regurgitation.  She was placed on GDMT including carvedilol, losartan, aspirin, Plavix, and Crestor.  Subsequent Zio patch demonstrated a predominant rhythm of sinus with an average heart rate of 89 bpm with a range of 40 to 166 bpm, rare PACs and PVCs, episodes of Mobitz type I second-degree AV block were noted.  There were no sustained arrhythmias, prolonged pauses, or evidence of A. fib/flutter.  Patient triggered events corresponded to sinus rhythm, PACs, PVCs, and Mobitz type I second-degree AV block.  With noted Wenckebach beta-blockers were discontinued. She was seen in the ED on 07/12/2020 with dizziness. MRI of the brain showed a new small subacute appearing white matter infarct in the left corona radiata that was new when compared to her study from 06/12/2020 and in the same vicinity as the prior ischemia without associated hemorrhage or mass-effect. She underwent TEE on 07/18/2020 which showed a possible tunnel in the anterior atrial septum without obvious right-to-left shunt by color  Doppler or bubble study.  Despite evidence of intracardiac shunting the suspicion for PFO remained given her age and recurrent strokes as well as possible tunnel noted in the anterior atrial septum on TEE.  Her LVEF remained reduced at 30 to 35%.  Since her admission in 06/2020 she has been evaluated in the ED several times for ongoing episodes of dizziness/lightheadedness as well as mild shortness of breath, occasional palpitations, and transient chest discomfort.  She has been referred to neurology for further evaluation of her strokes as well as for consideration of transcranial Dopplers.    She underwent diagnostic R/LHC on 08/02/2020 to further evaluate her cardiomyopathy which demonstrated severe ostial left main stenosis estimated at approximately 80% with pressure dampening of 11F diagnostic catheter. There was no significant improvement with intracoronary nitroglycerin. Otherwise, there was mild nonobstructive CAD involving the LAD and dominant LCx. There were normal left and right heart filling pressures as well as normal cardiac output and index. It was recommended she be transferred to [facility] for cardiac surgery consult. If she was found to not be a surgical candidate her left main stenosis was felt to be treatable percutaneously though this would require hemodynamic support given her reduced LVEF and left dominant system. Her last dose of clopidogrel was 08/01/2020.   Past Medical History:  Diagnosis Date  . CAD (coronary artery disease)   . CVA (cerebral vascular accident) ([place])   . Diabetes mellitus   . HFrEF (heart failure with reduced ejection fraction) ([place])   . HLD (hyperlipidemia)   . Hypertension     Past Surgical History:  Procedure Laterality Date  . KNEE  SURGERY    . TEE WITHOUT CARDIOVERSION N/A 07/18/2020   Procedure: TRANSESOPHAGEAL ECHOCARDIOGRAM (TEE);  Surgeon: [name], MD;  Location: [facility] ORS;  Service: Cardiovascular;  Laterality: N/A;     Medications  Prior to Admission: Prior to Admission medications   Medication Sig Start Date Sarajane Fambrough Date Taking? Authorizing Provider  Acetaminophen-Caff-Pyrilamine (MIDOL COMPLETE) 500-60-15 MG TABS Take 1-2 tablets by mouth every 8 (eight) hours as needed (menstrual cramps).    [provider]  aspirin EC 81 MG tablet Take 81 mg by mouth daily.     [provider]  atorvastatin (LIPITOR) 80 MG tablet Take 80 mg by mouth at bedtime. 07/14/20   [provider]  carvedilol (COREG) 3.125 MG tablet Take 3.125 mg by mouth 2 (two) times daily. 06/22/20   [provider]  clopidogrel (PLAVIX) 75 MG tablet Take 1 tablet (75 mg total) by mouth daily. 06/14/20   Lorella Nimrod, MD  ferrous sulfate 325 (65 FE) MG tablet Take 325 mg by mouth 3 (three) times daily with meals.     [provider]  losartan (COZAAR) 50 MG tablet Take 1 tablet (50 mg total) by mouth daily. 07/12/20 10/10/20  Loel Dubonnet, NP  metFORMIN (GLUCOPHAGE) 500 MG tablet Take 1,000 mg by mouth 2 (two) times daily with a meal.  09/08/13   [provider]  metFORMIN (GLUCOPHAGE-XR) 500 MG 24 hr tablet Take 1,000 mg by mouth 2 (two) times daily. 06/08/20   [provider]  tetrahydrozoline 0.05 % ophthalmic solution Place 1-2 drops into both eyes 3 (three) times daily as needed (irritated eyes.).    [provider]     Allergies:    Allergies  Allergen Reactions  . Peanut Butter Flavor Swelling  . Lisinopril Cough    Social History:   Social History   Socioeconomic History  . Marital status: Divorced    Spouse name: Not on file  . Number of children: Not on file  . Years of education: Not on file  . Highest education level: Not on file  Occupational History  . Not on file  Tobacco Use  . Smoking status: Former Research scientist (life sciences)  . Smokeless tobacco: Never Used  Vaping Use  . Vaping Use: Never used  Substance and Sexual Activity  . Alcohol use: Yes    Comment: social  . Drug  use: No  . Sexual activity: Not on file  Other Topics Concern  . Not on file  Social History Narrative  . Not on file   Social Determinants of Health   Financial Resource Strain:   . Difficulty of Paying Living Expenses: Not on file  Food Insecurity:   . Worried About Charity fundraiser in the Last Year: Not on file  . Ran Out of Food in the Last Year: Not on file  Transportation Needs:   . Lack of Transportation (Medical): Not on file  . Lack of Transportation (Non-Medical): Not on file  Physical Activity:   . Days of Exercise per Week: Not on file  . Minutes of Exercise per Session: Not on file  Stress:   . Feeling of Stress : Not on file  Social Connections:   . Frequency of Communication with Friends and Family: Not on file  . Frequency of Social Gatherings with Friends and Family: Not on file  . Attends Religious Services: Not on file  . Active Member of Clubs or Organizations: Not on file  . Attends Archivist Meetings:  Not on file  . Marital Status: Not on file  Intimate Partner Violence:   . Fear of Current or Ex-Partner: Not on file  . Emotionally Abused: Not on file  . Physically Abused: Not on file  . Sexually Abused: Not on file    Family History:   The patient's family history includes Heart disease in her mother.    ROS:  Please see the history of present illness.  All other ROS reviewed and negative.     Physical Exam/Data:  BP: 149/87, Pulse: 103 bpm, TempL 97.9, SpO2: 100%  Last 3 Weights 08/02/2020 07/26/2020 07/25/2020  Weight (lbs) 195 lb 192 lb 198 lb  Weight (kg) 88.451 kg 87.091 kg 89.812 kg     General:  Well nourished, well developed, in no acute distress HEENT: Normal Lymph: No adenopathy Neck: No JVD Endocrine:  No thryomegaly Vascular: No carotid bruits; FA pulses 2+ bilaterally without bruits  Cardiac:  Normal S1, S2; RRR; no murmur  Lungs:  Clear to auscultation bilaterally, no wheezing, rhonchi or rales  Abd: Soft,  nontender, no hepatomegaly  Ext: No edema Musculoskeletal:  No deformities, BUE and BLE strength normal and equal Skin: Warm and dry  Neuro:  CNs 2-12 intact, no focal abnormalities noted Psych:  Normal affect    EKG:  The ECG that was done 07/26/2020 was personally reviewed and demonstrates NSR, 86 bpm, no acute ST-T changes  Relevant CV Studies:  2D echo 06/13/2020: 1. Left ventricular ejection fraction, by estimation, is 35 to 40%. The  left ventricle has moderately decreased function. Left ventricular  endocardial border not optimally defined to evaluate regional wall motion.  The left ventricular internal cavity  size was mildly dilated. There is mild left ventricular hypertrophy. Left  ventricular diastolic function could not be evaluated.  2. Right ventricular systolic function is normal. The right ventricular  size is normal.  3. The mitral valve is normal in structure. Mild to moderate mitral valve  regurgitation.  4. The aortic valve has an indeterminant number of cusps. Aortic valve  regurgitation not well assessed.  5. The inferior vena cava is normal in size with greater than 50%  respiratory variability, suggesting right atrial pressure of 3 mmHg. __________  Elwyn Reach patch 06/2020:  The patient was monitored for 13 days, 11 hours.  The predominant rhythm was sinus with an average rate of 89 bpm (range 40-166 bpm).  There were rare PAC's and PVC's.  Episodes of Mobitz type 1 second degree AV block were noted.  There was no sustained arrhythmia or prolonged pause, including no evidence of atrial fibrillation/flutter.  Patient triggered events correspond to sinus rhythm, PVC's, PAC's, and Mobitz type 1 second degree AV block.   Predominantly sinus rhythm with rare PAC's and PVC's, as well as episodes of Mobitz type 1 second degree AV block. __________  TEE 07/18/2020: 1. No evidence of intracardiac thrombus.  2. Question small tunnel in the interatrial  septum. No evidence of right  to left shunt by color Dopper or bubble study.  3. Left ventricular ejection fraction, by estimation, is 30 to 35%. The  left ventricle has moderately decreased function. The left ventricular  internal cavity size was moderately dilated.  4. Right ventricular systolic function is normal. The right ventricular  size is normal.  5. No left atrial/left atrial appendage thrombus was detected. The LAA  emptying velocity was 67 cm/s.  6. The mitral valve is normal in structure. Mild to moderate mitral valve  regurgitation.  7. The aortic valve is tricuspid. Aortic valve regurgitation is not  visualized.  8. There is mild (Grade II) plaque involving the descending aorta.  9. Agitated saline contrast bubble study was negative, with no evidence  of any interatrial shunt. __________  [facility] 08/02/2020: Conclusions: 1. Severe ostial LMCA stenosis (~80%) with pressure dampening of 23F diagnostic catheter.  There is no significant improvement with intracoronary nitroglycerin. 2. Mild, non-obstructive coronary artery disease involving LAD and dominant LCx. 3. Normal left and right heart filling pressures. 4. Normal Fick cardiac output/index. 5. Right radial artery loop.  Recommendations: 1. Transfer to [facility] for cardiac surgery consultation.  If the patient is not a surgical candidate, her LMCA stenosis is treatable percutaneously, though this would require hemodynamic support given reduced LVEF and left-dominant system. 2. Hold clopidogrel pending cardiac surgery evaluation. 3. Avoid right radial artery access for future catheterization. 4. Aggressive secondary prevention of coronary artery disease.    Laboratory Data:  High Sensitivity Troponin:   Recent Labs  Lab 07/11/20 1749 07/11/20 2037 07/20/20 1608  TROPONINIHS 4 4 3       ChemistryNo results for input(s): NA, K, CL, CO2, GLUCOSE, BUN, CREATININE, CALCIUM, GFRNONAA, GFRAA, ANIONGAP in the  last 168 hours.  No results for input(s): PROT, ALBUMIN, AST, ALT, ALKPHOS, BILITOT in the last 168 hours. HematologyNo results for input(s): WBC, RBC, HGB, HCT, MCV, MCH, MCHC, RDW, PLT in the last 168 hours. BNPNo results for input(s): BNP, PROBNP in the last 168 hours.  DDimer No results for input(s): DDIMER in the last 168 hours.   Radiology/Studies:  CARDIAC CATHETERIZATION  Result Date: 08/02/2020 Conclusions: 1. Severe ostial LMCA stenosis (~80%) with pressure dampening of 23F diagnostic catheter.  There is no significant improvement with intracoronary nitroglycerin. 2. Mild, non-obstructive coronary artery disease involving LAD and dominant LCx. 3. Normal left and right heart filling pressures. 4. Normal Fick cardiac output/index. 5. Right radial artery loop. Recommendations: 1. Transfer to [facility] for cardiac surgery consultation.  If the patient is not a surgical candidate, her LMCA stenosis is treatable percutaneously, though this would require hemodynamic support given reduced LVEF and left-dominant system. 2. Hold clopidogrel pending cardiac surgery evaluation. 3. Avoid right radial artery access for future catheterization. 4. Aggressive secondary prevention of coronary artery disease. [name], MD [facility]     Assessment and Plan:   1. CAD involving the native coronary arteries without angina: Currently chest pain-free.  Diagnostic R/LHC at [facility] on 08/02/2020 demonstrated severe ostial left main stenosis estimated at approximately 80% with pressure dampening and no significant improvement with intracoronary nitroglycerin with recommendation to be transferred to [facility] for consideration of CABG versus complex PCI.  Continue aspirin, atorvastatin, and losartan.  Plavix will be held while she is being considered for CABG with her last dose being on 08/01/2020. Recommend aggressive secondary prevention of CAD.  2. HFrEF secondary to ICM: She appears euvolemic and well  compensated with NYHA class II symptoms. R/LHC showed normal left and right heart filling pressures with normal cardiac output/index. Not currently on a beta-blocker secondary to noted Wenckebach on outpatient cardiac monitoring.  For now she remains on losartan with consideration for addition of spironolactone and empagliflozin with possible transitioning to [facility] in the future.  Daily weights and strict I's and O's recommended.  3. Recurrent cryptogenic strokes: No new neurologic deficits.  Outpatient cardiac monitoring without evidence of A. fib/flutter.  TEE without evidence of intracardiac thrombus or shunting though suspicion for PFO remains given  her age and recurrent strokes as well as possible tunnel noted in the interatrial septum on TEE.  She has been referred to neurology with consideration for transcranial Dopplers noted.  She has also been referred to EP for consideration of ILR.  While she is being considered for possible CABG we will hold her Plavix.  4. HTN: Blood pressure is mildly elevated in specials recovery. Continue losartan.   5. HLD: LDL of 207 from 06/2020 while not on statin therapy at that time.  Goal LDL less than 70.  Obtain fasting lipid panel with recommendation to escalate lipid therapy as indicated.  Currently remains on atorvastatin 80 mg daily.  6. DM2: Last A1c 7.0 from 06/2020.  Metformin on hold.  SSI.   7. Lightheadedness: Longstanding issue with no clear evidence of orthostasis at her last visit.  It has been felt that anxiety may be contributing to her intermittent random episodes of lightheadedness.  She has been referred to behavioral health by her PCP.        New York Heart Association (NYHA) Functional Class NYHA Class II   Severity of Illness: The appropriate patient status for this patient is INPATIENT. Inpatient status is judged to be reasonable and necessary in order to provide the required intensity of service to ensure the patient's safety.  The patient's presenting symptoms, physical exam findings, and initial radiographic and laboratory data in the context of their chronic comorbidities is felt to place them at high risk for further clinical deterioration. Furthermore, it is not anticipated that the patient will be medically stable for discharge from the hospital within 2 midnights of admission. The following factors support the patient status of inpatient.   " The patient's presenting symptoms include as above. " The worrisome physical exam findings include as above. " The initial radiographic and laboratory data are worrisome because of as above. " The chronic co-morbidities include as above.   * I certify that at the point of admission it is my clinical judgment that the patient will require inpatient hospital care spanning beyond 2 midnights from the point of admission due to high intensity of service, high risk for further deterioration and high frequency of surveillance required.*    For questions or updates, please contact [place] Please consult [email or web address] for contact info under     Signed, Christell Faith, PA-C  08/02/2020 9:42 AM    I have independently seen and examined the patient and agree with the findings and plan, as documented in the PA/NP's note, with the following additions/changes.  Stacey Richardson is a 54 year old woman with history of recent strokes and concurrently diagnosed HFrEF (LVEF 30-35% by TEE earlier this month), hypertension, hyperlipidemia, type 2 diabetes mellitus, and obesity.  She has been seen in the ED multiple times for lightheadedness and near syncope as well as shortness of breath.  She presented for right and left heart catheterization today for further evaluation of her cardiomyopathy.  This showed normal left and right heart filling pressures and normal cardiac output.  Coronary angiography revealed an 80% ostial left main coronary artery stenosis involving a dominant left coronary artery.   She has therefore been transferred to [facility] for surgical consultation and definitive management of her LMCA stenosis.  Stacey Richardson has been without chest pain but continues to note intermittent shortness of breath with modest activity consistent with NYHA class III heart failure.  She is currently only on losartan, as she was noted to have transient episode of Mobitz type  I second-degree AV block while on low-dose carvedilol.  Physical exam is notable for regular rate and rhythm without murmurs, rubs, or gallops.  Abdomen is soft and nontender.  There is no lower extremity edema.  Labs on 07/25/2020 are notable for potassium 3.9, CO2 24, BUN 10, creatinine 0.6, hemoglobin 11.9, platelets 311.  In summary, Stacey Richardson is a 55 year old woman with history of recently diagnosed HFrEF and NYHA class III symptoms, found to have severe stenosis involving ostium of left dominant LMCA.  She has been on dual antiplatelet therapy with aspirin and clopidogrel following her recent strokes.  Clopidogrel will be held.  Cardiac surgery consultation has been requested.  Given her low LVEF and diabetes, surgical revascularization may be preferable.  However, if she is felt to be a poor operative candidate, PCI to the LMCA could be undertaken.  This would likely require hemodynamic support, however, given reduced LVEF and left dominant system.  In the meantime, we will continue current dose of losartan.  We will defer rechallenging her with a beta-blocker for now given her history of Wenkebach.  I think it is reasonable to defer heparin infusion, given that she has not presented with ACS.  Secondary prevention with high intensity statin therapy will be continued.  We will hold metformin and plan to use sliding scale insulin while in the hospital.  Addition of empagliflozin at the time of discharge or as an outpatient should be considered as well.  [name], MD [facility]

## 2020-08-02 NOTE — Progress Notes (Signed)
All belongings with sent with patient to [facility]. Coat, purse, shoes, clothes, cell phone.

## 2020-08-03 DIAGNOSIS — I25110 Atherosclerotic heart disease of native coronary artery with unstable angina pectoris: Secondary | ICD-10-CM

## 2020-08-03 DIAGNOSIS — I255 Ischemic cardiomyopathy: Secondary | ICD-10-CM

## 2020-08-03 DIAGNOSIS — I5022 Chronic systolic (congestive) heart failure: Secondary | ICD-10-CM | POA: Diagnosis not present

## 2020-08-03 DIAGNOSIS — I2510 Atherosclerotic heart disease of native coronary artery without angina pectoris: Secondary | ICD-10-CM | POA: Diagnosis not present

## 2020-08-03 LAB — COMPREHENSIVE METABOLIC PANEL
ALT: 13 U/L (ref 0–44)
AST: 14 U/L — ABNORMAL LOW (ref 15–41)
Albumin: 3.3 g/dL — ABNORMAL LOW (ref 3.5–5.0)
Alkaline Phosphatase: 38 U/L (ref 38–126)
Anion gap: 8 (ref 5–15)
BUN: 7 mg/dL (ref 6–20)
CO2: 24 mmol/L (ref 22–32)
Calcium: 8.8 mg/dL — ABNORMAL LOW (ref 8.9–10.3)
Chloride: 107 mmol/L (ref 98–111)
Creatinine, Ser: 0.57 mg/dL (ref 0.44–1.00)
GFR, Estimated: >60 mL/min (ref >60)
Glucose, Bld: 117 mg/dL — ABNORMAL HIGH (ref 70–99)
Potassium: 3.8 mmol/L (ref 3.5–5.1)
Sodium: 139 mmol/L (ref 135–145)
Total Bilirubin: 0.3 mg/dL (ref 0.3–1.2)
Total Protein: 6.2 g/dL — ABNORMAL LOW (ref 6.5–8.1)

## 2020-08-03 LAB — GLUCOSE, CAPILLARY
Glucose-Capillary: 94 mg/dL (ref 70–99)
Glucose-Capillary: 94 mg/dL (ref 70–99)
Glucose-Capillary: 109 mg/dL — ABNORMAL HIGH (ref 70–99)
Glucose-Capillary: 193 mg/dL — ABNORMAL HIGH (ref 70–99)
Glucose-Capillary: 176 mg/dL — ABNORMAL HIGH (ref 70–99)

## 2020-08-03 LAB — CBC
HCT: 32.9 % — ABNORMAL LOW (ref 36.0–46.0)
Hemoglobin: 10.4 g/dL — ABNORMAL LOW (ref 12.0–15.0)
MCH: 27.5 pg (ref 26.0–34.0)
MCHC: 31.6 g/dL (ref 30.0–36.0)
MCV: 87 fL (ref 80.0–100.0)
Platelets: 383 10*3/uL (ref 150–400)
RBC: 3.78 MIL/uL — ABNORMAL LOW (ref 3.87–5.11)
RDW: 13.2 % (ref 11.5–15.5)
WBC: 6.8 10*3/uL (ref 4.0–10.5)
nRBC: 0 % (ref 0.0–0.2)

## 2020-08-03 LAB — LIPID PANEL
Cholesterol: 104 mg/dL (ref 0–200)
HDL: 32 mg/dL — ABNORMAL LOW (ref >40)
LDL Cholesterol: 60 mg/dL (ref 0–99)
Total CHOL/HDL Ratio: 3.3 RATIO
Triglycerides: 60 mg/dL (ref <150)
VLDL: 12 mg/dL (ref 0–40)

## 2020-08-03 MED ORDER — LORAZEPAM 1 MG PO TABS
1.0000 mg | ORAL_TABLET | Freq: Four times a day (QID) | ORAL | Status: DC | PRN
  Administered 2020-08-03 – 2020-08-05 (×4): 1 mg via ORAL
  Filled 2020-08-03 (×5): qty 1

## 2020-08-03 MED ORDER — HYDROXYZINE HCL 25 MG PO TABS: 25.0000 mg | ORAL_TABLET | Freq: Every day | ORAL | Status: DC | PRN

## 2020-08-03 MED ORDER — INSULIN ASPART 100 UNIT/ML ~~LOC~~ SOLN
0.0000 [IU] | Freq: Three times a day (TID) | SUBCUTANEOUS | Status: DC
  Administered 2020-08-03 – 2020-08-04 (×2): 2 [IU] via SUBCUTANEOUS
  Administered 2020-08-04: 1 [IU] via SUBCUTANEOUS
  Administered 2020-08-04: 2 [IU] via SUBCUTANEOUS
  Administered 2020-08-05: 5 [IU] via SUBCUTANEOUS
  Administered 2020-08-05: 2 [IU] via SUBCUTANEOUS

## 2020-08-03 NOTE — Progress Notes (Signed)
Progress Note  Patient Name: Stacey Richardson Date of Encounter: 08/03/2020  [facility] Cardiologist: [name], MD   Subjective   Very anxious. No active chest pain or dyspnea.  Inpatient Medications    Scheduled Meds: . aspirin EC  81 mg Oral Daily  . atorvastatin  80 mg Oral Daily  . ferrous sulfate  325 mg Oral TID WC  . heparin  5,000 Units Subcutaneous Q8H  . insulin aspart  0-24 Units Subcutaneous Q4H  . losartan  50 mg Oral Daily   Continuous Infusions:  PRN Meds: acetaminophen, hydrOXYzine, LORazepam, melatonin, naphazoline-glycerin, nitroGLYCERIN, ondansetron (ZOFRAN) IV   Vital Signs    Vitals:   08/02/20 2011 08/03/20 0010 08/03/20 0528 08/03/20 0534  BP: 120/61 117/73 122/70   Pulse: 75 70 91   Resp: 18 11 12    Temp: 97.6 F (36.4 C) 97.8 F (36.6 C) 98.1 F (36.7 C)   TempSrc: Oral Oral Tympanic   SpO2: 100% 99% 100%   Weight:    85.8 kg    Intake/Output Summary (Last 24 hours) at 08/03/2020 0742 Last data filed at 08/02/2020 2000 Gross per 24 hour  Intake 360 ml  Output --  Net 360 ml   Last 3 Weights 08/03/2020 08/02/2020 07/26/2020  Weight (lbs) 189 lb 1.6 oz 195 lb 192 lb  Weight (kg) 85.775 kg 88.451 kg 87.091 kg      Telemetry    NSR - Personally Reviewed  ECG    None today - Personally Reviewed  Physical Exam   GEN: No acute distress.   Neck: No JVD Cardiac: RRR, no murmurs, rubs, or gallops.  Respiratory: Clear to auscultation bilaterally. GI: Soft, nontender, non-distended  MS: No edema; No deformity. No hematoma in the right groin or right radial cath sites.  Neuro:  Nonfocal  Psych: Normal affect   Labs    High Sensitivity Troponin:   Recent Labs  Lab 07/11/20 1749 07/11/20 2037 07/20/20 1608  TROPONINIHS 4 4 3       Chemistry Recent Labs  Lab 08/02/20 1755 08/03/20 0042  NA  --  139  K  --  3.8  CL  --  107  CO2  --  24  GLUCOSE  --  117*  BUN  --  7  CREATININE 0.61 0.57  CALCIUM  --   8.8*  PROT  --  6.2*  ALBUMIN  --  3.3*  AST  --  14*  ALT  --  13  ALKPHOS  --  38  BILITOT  --  0.3  GFRNONAA >60 >60  ANIONGAP  --  8     Hematology Recent Labs  Lab 08/02/20 1755 08/03/20 0042  WBC 7.9 6.8  RBC 3.75* 3.78*  HGB 10.2* 10.4*  HCT 32.3* 32.9*  MCV 86.1 87.0  MCH 27.2 27.5  MCHC 31.6 31.6  RDW 13.1 13.2  PLT 377 383    BNPNo results for input(s): BNP, PROBNP in the last 168 hours.   DDimer No results for input(s): DDIMER in the last 168 hours.   Radiology    CARDIAC CATHETERIZATION  Result Date: 08/02/2020 Conclusions: 1. Severe ostial LMCA stenosis (~80%) with pressure dampening of 43F diagnostic catheter.  There is no significant improvement with intracoronary nitroglycerin. 2. Mild, non-obstructive coronary artery disease involving LAD and dominant LCx. 3. Normal left and right heart filling pressures. 4. Normal Fick cardiac output/index. 5. Right radial artery loop. Recommendations: 1. Transfer to [facility] for cardiac surgery consultation.  If the patient is not a surgical candidate, her LMCA stenosis is treatable percutaneously, though this would require hemodynamic support given reduced LVEF and left-dominant system. 2. Hold clopidogrel pending cardiac surgery evaluation. 3. Avoid right radial artery access for future catheterization. 4. Aggressive secondary prevention of coronary artery disease. [name], MD [facility]    Cardiac Studies   Echo 06/13/20: IMPRESSIONS    1. Left ventricular ejection fraction, by estimation, is 35 to 40%. The  left ventricle has moderately decreased function. Left ventricular  endocardial border not optimally defined to evaluate regional wall motion.  The left ventricular internal cavity  size was mildly dilated. There is mild left ventricular hypertrophy. Left  ventricular diastolic function could not be evaluated.  2. Right ventricular systolic function is normal. The right ventricular  size is  normal.  3. The mitral valve is normal in structure. Mild to moderate mitral valve  regurgitation.  4. The aortic valve has an indeterminant number of cusps. Aortic valve  regurgitation not well assessed.  5. The inferior vena cava is normal in size with greater than 50%  respiratory variability, suggesting right atrial pressure of 3 mmHg.   Event monitor 06/19/20: Study Highlights    The patient was monitored for 13 days, 11 hours.  The predominant rhythm was sinus with an average rate of 89 bpm (range 40-166 bpm).  There were rare PAC's and PVC's.  Episodes of Mobitz type 1 second degree AV block were noted.  There was no sustained arrhythmia or prolonged pause, including no evidence of atrial fibrillation/flutter.  Patient triggered events correspond to sinus rhythm, PVC's, PAC's, and Mobitz type 1 second degree AV block.   Predominantly sinus rhythm with rare PAC's and PVC's, as well as episodes of Mobitz type 1 second degree AV block.  TEE 07/18/20: IMPRESSIONS    1. No evidence of intracardiac thrombus.  2. Question small tunnel in the interatrial septum. No evidence of right  to left shunt by color Dopper or bubble study.  3. Left ventricular ejection fraction, by estimation, is 30 to 35%. The  left ventricle has moderately decreased function. The left ventricular  internal cavity size was moderately dilated.  4. Right ventricular systolic function is normal. The right ventricular  size is normal.  5. No left atrial/left atrial appendage thrombus was detected. The LAA  emptying velocity was 67 cm/s.  6. The mitral valve is normal in structure. Mild to moderate mitral valve  regurgitation.  7. The aortic valve is tricuspid. Aortic valve regurgitation is not  visualized.  8. There is mild (Grade II) plaque involving the descending aorta.  9. Agitated saline contrast bubble study was negative, with no evidence  of any interatrial shunt.   Cardiac cath  08/02/20:  RIGHT/LEFT HEART CATH AND CORONARY ANGIOGRAPHY  Conclusion  Conclusions: 1. Severe ostial LMCA stenosis (~80%) with pressure dampening of 37F diagnostic catheter.  There is no significant improvement with intracoronary nitroglycerin. 2. Mild, non-obstructive coronary artery disease involving LAD and dominant LCx. 3. Normal left and right heart filling pressures. 4. Normal Fick cardiac output/index. 5. Right radial artery loop.  Recommendations: 1. Transfer to [facility] for cardiac surgery consultation.  If the patient is not a surgical candidate, her LMCA stenosis is treatable percutaneously, though this would require hemodynamic support given reduced LVEF and left-dominant system. 2. Hold clopidogrel pending cardiac surgery evaluation. 3. Avoid right radial artery access for future catheterization. 4. Aggressive secondary prevention of coronary artery disease.  Christopher End,  MD [facility]  Recommendations  Antiplatelet/Anticoag Hold clopidogrel pending cardiac surgery evaluation.  Continue indefinite aspirin 81 mg daily.  Discharge Date In the absence of any other complications or medical issues, we expect the patient to be ready for discharge from a cath perspective on 08/02/2020. Transfer to [facility] for cardiac surgery evaluation.  Surgeon Notes    07/18/2020 8:36 AM CV Procedure signed by [name], MD  Indications  Chronic HFrEF (heart failure with reduced ejection fraction) ([place]) [I50.22 (ICD-10-CM)]  Procedural Details  Technical Details Indication: 54 y.o. year-old woman with history of recent strokes and concurrently diagnosed HFrEF, as well as hypertension, hyperlipidemia, type 2 diabetes mellitus, and obesity, presenting for evaluation of HFrEF and recurrent lightheadedness/near-syncope.  GFR: >60 ml/min  Procedure: The risks, benefits, complications, treatment options, and expected outcomes were discussed with the patient. The patient  and/or family concurred with the proposed plan, giving informed consent. The patient was sedated with IV midazolam and fentanyl. The right wrist and elbow were prepped and draped in a sterile fashion. 1% lidocaine was used for local anesthesia. Ultrasound was used to evaluate the right basilic artery. It was patent. A micropuncture needle was used to access the right basilic vein under ultrasound guidance. A 84F slender Glidesheath was inserted using modified Seldinger technique. Right heart catheterization was performed by advancing a 84F balloon-tipped catheter through the right heart chambers into the pulmonary capillary wedge position. Pressure measurements and oxygen saturations were obtained.  Ultrasound was used to evaluate the right radial artery. It was patent.  A micropuncture needle was used to access the right radial artery under ultrasound guidance.  Using the modified Seldinger access technique, a 22F slender Glidesheath was placed in the right radial artery. 3 mg Verapamil was given through the sheath. Heparin 4,500 units were administered. A right radial loop was encounter and was successfully crossed with a BMW wire.  However, I was unable to cross the loop with the catheter.  Decision was made to switch to femoral access.  The right groin was already prepped and draped in sterile fashion.  1% lidocaine was used for local anesthesia.  Ultrasound was used to evaluate the right common femoral artery. It was patent.   A micropuncture needle was used to access the right common femoral artery under ultrasound guidance.  Using modified Seldinger technique, a 84F sheath was placed in the right common femoral artery.  Placement was confirmed with a sheath angiogram.  Selective coronary angiography was performed using a 84F JL4 catheter to engage the left coronary artery and a 84F JR4 catheter to engage the right coronary artery. Intracoronary nitroglycerin was administered into the left coronary artery.   Left heart catheterization was performed using a 84F JR4 catheter. Left ventriculogram was not performed.  At the end of the procedure, the radial artery sheath was removed and a TR band applied to achieve patent hemostasis. There were no immediate complications. The patient was taken to the recovery area in stable condition.  Estimated blood loss <50 mL.   During this procedure medications were administered to achieve and maintain moderate conscious sedation while the patient's heart rate, blood pressure, and oxygen saturation were continuously monitored and I was present face-to-face 100% of this time.  Medications (Filter: Administrations occurring from 0728 to 0903 on 08/02/20) (important) Continuous medications are totaled by the amount administered until 08/02/20 0903.  midazolam (VERSED) injection (mg) Total dose:  3 mg Date/Time  Rate/Dose/Volume Action  08/02/20 0737  2 mg Given  0813  1 mg Given    fentaNYL (SUBLIMAZE) injection (mcg) Total dose:  50 mcg Date/Time  Rate/Dose/Volume Action  08/02/20 0737  25 mcg Given  0813  25 mcg Given    lidocaine (PF) (XYLOCAINE) 1 % injection (mL) Total volume:  17 mL Date/Time  Rate/Dose/Volume Action  08/02/20 0745  5 mL Given  0810  12 mL Given    verapamil (ISOPTIN) injection (mg) Total dose:  2.5 mg Date/Time  Rate/Dose/Volume Action  08/02/20 0755  2.5 mg Given    heparin sodium (porcine) injection (Units) Total dose:  4,500 Units Date/Time  Rate/Dose/Volume Action  08/02/20 0821  4,500 Units Given    nitroGLYCERIN 1 mg/10 mL (100 mcg/mL) - IR/CATH LAB (mcg) Total dose:  200 mcg Date/Time  Rate/Dose/Volume Action  08/02/20 0827  200 mcg Given    iohexol (OMNIPAQUE) 300 MG/ML solution (mL) Total volume:  65 mL Date/Time  Rate/Dose/Volume Action  08/02/20 0849  65 mL Given    Heparin (Porcine) in NaCl 2000-0.9 UNIT/L-% SOLN (mL) Total volume:  1,000 mL Date/Time  Rate/Dose/Volume Action  08/02/20 0857  1,000 mL  Given    Sedation Time  Sedation Time Physician-1: 1 hour 2 minutes 33 seconds  Contrast  Medication Name Total Dose  iohexol (OMNIPAQUE) 300 MG/ML solution 65 mL    Radiation/Fluoro  Fluoro time: 11.6 (min) DAP: 27.3 (Gycm2) Cumulative Air Kerma: 354 (mGy)  Complications  Complications documented before study signed (08/02/2020 6:56 AM)   No complications were associated with this study.  Documented by [name], MD - 08/02/2020 9:16 AM    Coronary Findings  Diagnostic Dominance: Left Left Main  Vessel is large.  Ost LM lesion is 80% stenosed. The lesion is eccentric.  Left Anterior Descending  Vessel is large. The vessel exhibits minimal luminal irregularities.  First Diagonal Branch  Vessel is small in size.  Second Diagonal Branch  Vessel is small in size.  Third Diagonal Branch  Vessel is small in size.  Left Circumflex  Vessel is large.  Prox Cx lesion is 25% stenosed.  Mid Cx lesion is 25% stenosed.  First Obtuse Marginal Branch  Vessel is small in size.  Second Obtuse Marginal Branch  Vessel is large in size.  Left Posterior Descending Artery  Vessel is small in size.  First Left Posterolateral Branch  Vessel is small in size.  Right Coronary Artery  Vessel is small. Vessel is angiographically normal.  Intervention  No interventions have been documented. Right Heart  Right Heart Pressures RA (mean): 8 mmHg RV (S/EDP): 32/8 mmHg PA (S/D, mean): 27/14 mmHg PCWP (mean): 15 mmHg  Ao sat: 99% PA sat: 75%  Fick CO: 6.0 L/min Fick CI: 3.1 L/min/m^2  Left Heart  Left Ventricle LV end diastolic pressure is normal. LVEDP 10 mmHg.  Aortic Valve There is no aortic valve stenosis.  Coronary Diagrams  Diagnostic Dominance: Left  Intervention  Implants   No implant documentation for this case.  Syngo Images  Show images for CARDIAC CATHETERIZATION Images on Long Term Storage  Show images for Anarie, [place] to Procedure  Log  Procedure Log    Hemo Data (last day) before discharge   AO Systolic Cath Pressure  AO Diastolic Cath Pressure  AO Mean Cath Pressure  LV Systolic Cath Pressure  LV End Diastolic  LV Systolic  LV End Diastolic  LV dP/dt  PA Systolic Cath Pressure  PA Diastolic Cath Pressure  PA Mean Cath Pressure  RA Wedge  A Wave  RA Wedge V Wave  RV Systolic Cath Pressure  RV Diastolic Cath Pressure  RV End Diastolic  RV Systolic  RV End Diastolic  RV dP/dt  PCW A Wave  PCW V Wave  PCW Mean  AO O2 Sat  PA O2 Sat  AO O2 Sat  Fick C.O.  Fick C.I.   --  --  --  --  --  127 mmHg  5 mmHg  1248 mmHg/sec  --  --  --  7 mmHg  6 mmHg  --  --  --  31 mmHg  10 mmHg  432 mmHg/sec  16 mmHg  18 mmHg  15 mmHg  --  --  --  6.04 L/min  3.09 L/min/m2   --  --  --  --  --  --  --  --  --  --  --  --  --  31 mmHg  3 mmHg  10 mmHg  --  --  --  --  --  --  --  --  --  --  --   --  --  --  --  --  --  --  --  25 mmHg  12 mmHg  18 mmHg  --  --  --  --  --  --  --  --  --  --  --  --  --  --  --  --   140  70 mmHg  96 mmHg  --  --  --  --  --  --  --  --  --  --  --  --  --  --  --  --  --  --  --  --  --  --  --  --   143  77 mmHg  89 mmHg  --  --  --  --  --  --  --  --  --  --  --  --  --  --  --  --  --  --  --  --  --  --  --  --   --  --  --  --  --  --  --  --  --  --  --  --  --  --  --  --  --  --  --  --  --  --  --  PA  --  --  --   140  72 mmHg  86 mmHg  --  --  --  --  --  --  --  --  --  --  --  --  --  --  --  --  --  --  --  --  --  --  --  --   --  --  --  --  --  --  --  --  --  --  --  --  --  --  --  --  --  --  --  --  --  --  98.7 %  --  SA  --  --   137  76 mmHg  100 mmHg  --  --  --  --  --  --  --  --  --  --  --  --  --  --  --  --  --  --  --  --  --  --  --  --   --  --  --  121 mmHg  2 mmHg  --  --  --  --  --  --  --  --  --  --  --  --  --  --  --  --  --  --  --  --  --  --   --  --  --  121 mmHg  5 mmHg  --  --  --  --  --  --  --  --  --  --  --  --  --  --  --  --  --  --  --  --  --  --   --  --   --  127 mmHg  5 mmHg  --  --  --  --  --  --  --  --  --  --  --  --  --  --  --  --  --  --  --  --  --  --   127  75 mmHg  98 mmHg  --  --                                                 Patient Profile     54 y.o. female with history of DM, cryptogenic CVA, HTN and HLD presents for surgical evaluation for left main CAD.   Assessment & Plan    1.  CAD with severe ostial Left  Main disease in a left dominant circulation. Otherwise no significant disease.  Currently chest pain-free.  Diagnostic R/LHC at [facility] on 08/02/2020 demonstrated severe ostial left main stenosis estimated at approximately 80% with pressure dampening and no significant improvement with intracoronary nitroglycerin with recommendation to be transferred to [facility] for consideration of CABG versus complex PCI.  Continue aspirin, atorvastatin, and losartan.  Plavix will be held while she is being considered for CABG with her last dose being on 08/01/2020. Recommend aggressive secondary prevention of CAD. Surgical consultation pending. She did have recent CVA but is now almost 8 weeks out from this.   2. HFrEF secondary to ICM: EF 35-40%. She appears euvolemic and well compensated with NYHA class II symptoms. R/LHC showed normal left and right heart filling pressures with normal cardiac output/index. Not currently on a beta-blocker secondary to noted Wenckebach on outpatient cardiac monitoring.  For now she remains on losartan with consideration for addition of spironolactone and empagliflozin with possible transitioning to [facility] in the future.  Daily weights and strict I's and O's recommended.  3. Recurrent cryptogenic strokes: last on October 4.  No new neurologic deficits.  Outpatient cardiac monitoring without evidence of A. fib/flutter.  TEE without evidence of intracardiac thrombus or shunting though suspicion for PFO remains given her age and recurrent strokes as well as possible tunnel noted in the interatrial septum on  TEE.  She has been referred to neurology with consideration for transcranial Dopplers noted.  She has also been referred to EP for consideration of ILR.  While she is being considered for possible CABG we will hold her Plavix.  4. HTN: Blood pressure is controlled.  Continue losartan.   5. HLD: LDL of 207 from 06/2020 while not on statin therapy at that time.  Goal LDL less than 70.  Currently remains on atorvastatin 80 mg daily. Repeat LDL 60 on therapy.  6. DM2: Last A1c 7.0 from 06/2020.  Metformin on hold.  SSI.    For questions or updates, please contact [place] Please consult [email or web address] for contact info under        Signed, Grey Rakestraw [place], MD  08/03/2020, 7:42 AM

## 2020-08-03 NOTE — Consult Note (Signed)
[address]       [place] [address]             [phone]        Bree L Regas [place] Medical Record #[id number] Date of Birth: Aug 17, 1966  Referring: No ref. provider found Primary Care: [name], [name], MD Primary Cardiologist:Christopher End, MD  Chief Complaint:   No chief complaint on file. Heart failure and history of stroke  History of Present Illness:     54 year old lady was in her usual state of health when she presented with cryptogenic strokes in October of this year.  Her work-up has been extensive without any clear explanation.  At the time she was found to have mildly depressed LV function.  She underwent left heart catheterization 2 days ago demonstrating 80% left main lesion.  She is transferred to [facility] for surgical consideration.  Her Plavix has been held.  Her TEE was not definitive for intracardiac thrombus or PFO.  She also had a Holter monitor that did not demonstrate A. fib or flutter.   Current Activity/ Functional Status: Patient will be independent with mobility/ambulation, transfers, ADL's, IADL's.   Zubrod Score: At the time of surgery this patient's most appropriate activity status/level should be described as: []     0    Normal activity, no symptoms []     1    Restricted in physical strenuous activity but ambulatory, able to do out light work []     2    Ambulatory and capable of self care, unable to do work activities, up and about                 more than 50%  Of the time                            []     3    Only limited self care, in bed greater than 50% of waking hours []     4    Completely disabled, no self care, confined to bed or chair []     5    Moribund  Past Medical History:  Diagnosis Date  . CAD (coronary artery disease)   . CVA (cerebral vascular accident) ([place])   . Diabetes mellitus   . HFrEF (heart failure with reduced ejection fraction) ([place])   . HLD (hyperlipidemia)   . Hypertension      Past Surgical History:  Procedure Laterality Date  . KNEE SURGERY    . TEE WITHOUT CARDIOVERSION N/A 07/18/2020   Procedure: TRANSESOPHAGEAL ECHOCARDIOGRAM (TEE);  Surgeon: [name], MD;  Location: [facility] ORS;  Service: Cardiovascular;  Laterality: N/A;    Social History   Tobacco Use  Smoking Status Former Smoker  Smokeless Tobacco Never Used    Social History   Substance and Sexual Activity  Alcohol Use Yes   Comment: social     Allergies  Allergen Reactions  . Peanut Butter Flavor Swelling  . Lisinopril Cough    Current Facility-Administered Medications  Medication Dose Route Frequency Provider Last Rate Last Admin  . acetaminophen (TYLENOL) tablet 650 mg  650 mg Oral Q4H PRN Kathyrn Drown D, NP   650 mg at 08/02/20 1750  . aspirin EC tablet 81 mg  81 mg Oral Daily Kathyrn Drown D, NP   81 mg at 08/03/20 0853  . atorvastatin (LIPITOR) tablet 80 mg  80 mg Oral Daily Kathyrn Drown  D, NP   80 mg at 08/03/20 0853  . ferrous sulfate tablet 325 mg  325 mg Oral TID WC Kathyrn Drown D, NP   325 mg at 08/03/20 0853  . heparin injection 5,000 Units  5,000 Units Subcutaneous Q8H Kathyrn Drown D, NP   5,000 Units at 08/03/20 0540  . hydrOXYzine (ATARAX/VISTARIL) tablet 25 mg  25 mg Oral Daily PRN Renae Fickle, MD      . insulin aspart (novoLOG) injection 0-24 Units  0-24 Units Subcutaneous Q4H Kathyrn Drown D, NP   4 Units at 08/02/20 2049  . LORazepam (ATIVAN) tablet 1 mg  1 mg Oral Q6H PRN Martinique, Peter M, MD   1 mg at 08/03/20 1038  . losartan (COZAAR) tablet 50 mg  50 mg Oral Daily Kathyrn Drown D, NP   50 mg at 08/03/20 0853  . melatonin tablet 6 mg  6 mg Oral QHS PRN Renae Fickle, MD   6 mg at 08/02/20 2217  . naphazoline-glycerin (CLEAR EYES REDNESS) ophth solution 1-2 drop  1-2 drop Both Eyes QID PRN Kathyrn Drown D, NP      . nitroGLYCERIN (NITROSTAT) SL tablet 0.4 mg  0.4 mg Sublingual Q5 Min x 3 PRN Kathyrn Drown D, NP      . ondansetron [facility])  injection 4 mg  4 mg Intravenous Q6H PRN Kathyrn Drown D, NP        Medications Prior to Admission  Medication Sig Dispense Refill Last Dose  . acetaminophen (TYLENOL) 500 MG tablet Take 500 mg by mouth every 6 (six) hours as needed for moderate pain or headache.   08/02/2020 at Unknown time  . aspirin EC 81 MG tablet Take 81 mg by mouth daily.    08/02/2020 at Unknown time  . atorvastatin (LIPITOR) 80 MG tablet Take 80 mg by mouth at bedtime.   08/01/2020 at Unknown time  . clopidogrel (PLAVIX) 75 MG tablet Take 1 tablet (75 mg total) by mouth daily. 90 tablet 0 08/01/2020 at Unknown time  . ferrous sulfate 325 (65 FE) MG tablet Take 325 mg by mouth 3 (three) times daily with meals.    08/01/2020 at Unknown time  . losartan (COZAAR) 50 MG tablet Take 1 tablet (50 mg total) by mouth daily. 90 tablet 0 08/01/2020 at Unknown time  . metFORMIN (GLUCOPHAGE-XR) 500 MG 24 hr tablet Take 1,000 mg by mouth 2 (two) times daily.   08/01/2020 at Unknown time  . tetrahydrozoline 0.05 % ophthalmic solution Place 1-2 drops into both eyes 3 (three) times daily as needed (irritated eyes.).   08/02/2020 at Unknown time  . Acetaminophen-Caff-Pyrilamine (MIDOL COMPLETE) 500-60-15 MG TABS Take 1-2 tablets by mouth every 8 (eight) hours as needed (menstrual cramps).     . carvedilol (COREG) 3.125 MG tablet Take 3.125 mg by mouth 2 (two) times daily.       Family History  Problem Relation Age of Onset  . Heart disease Mother        a. ?valve     Review of Systems:   ROS A comprehensive review of systems was negative.     Cardiac Review of Systems: Y or  [    ]= no  Chest Pain [    ]  Resting SOB [   ] Exertional SOB  [  ]  Orthopnea [  ]   Pedal Edema [   ]    Palpitations [  ] Syncope  [  ]   Presyncope [   ]  General Review of Systems: [Y] = yes [  ]=no Constitional: recent weight change [  ]; anorexia [  ]; fatigue [  ]; nausea [  ]; night sweats [  ]; fever [  ]; or chills [  ]                                                                Dental: Last Dentist visit:   Eye : blurred vision [  ]; diplopia [   ]; vision changes [  ];  Amaurosis fugax[  ]; Resp: cough [  ];  wheezing[  ];  hemoptysis[  ]; shortness of breath[  ]; paroxysmal nocturnal dyspnea[  ]; dyspnea on exertion[  ]; or orthopnea[  ];  GI:  gallstones[  ], vomiting[  ];  dysphagia[  ]; melena[  ];  hematochezia [  ]; heartburn[  ];   Hx of  Colonoscopy[  ]; GU: kidney stones [  ]; hematuria[  ];   dysuria [  ];  nocturia[  ];  history of     obstruction [  ]; urinary frequency [  ]             Skin: rash, swelling[  ];, hair loss[  ];  peripheral edema[  ];  or itching[  ]; Musculosketetal: myalgias[  ];  joint swelling[  ];  joint erythema[  ];  joint pain[  ];  back pain[  ];  Heme/Lymph: bruising[  ];  bleeding[  ];  anemia[  ];  Neuro: TIA[  ];  headaches[  ];  stroke[  ];  vertigo[  ];  seizures[  ];   paresthesias[  ];  difficulty walking[  ];  Psych:depression[  ]; anxiety[  ];  Endocrine: diabetes[  ];  thyroid dysfunction[  ];                  Physical Exam: BP 122/70 (BP Location: Left Arm)   Pulse 91   Temp 98.1 F (36.7 C) (Tympanic)   Resp 12   Wt 85.8 kg   LMP 07/25/2020 Comment: hcg neg today  SpO2 100%   BMI 31.47 kg/m    General appearance: alert and cooperative Head: Normocephalic, without obvious abnormality, atraumatic Neck: no adenopathy, no carotid bruit, no JVD, supple, symmetrical, trachea midline and thyroid not enlarged, symmetric, no tenderness/mass/nodules Lymph nodes: Cervical, supraclavicular, and axillary nodes normal. Resp: clear to auscultation bilaterally Cardio: Tachycardic and no murmurs GI: soft, non-tender; bowel sounds normal; no masses,  no organomegaly Extremities: extremities normal, atraumatic, no cyanosis or edema Neurologic: Alert and oriented X 3, normal strength and tone. Normal symmetric reflexes. Normal coordination and gait  Diagnostic Studies & Laboratory  data:     Recent Radiology Findings:   CARDIAC CATHETERIZATION  Result Date: 08/02/2020 Conclusions: 1. Severe ostial LMCA stenosis (~80%) with pressure dampening of 59F diagnostic catheter.  There is no significant improvement with intracoronary nitroglycerin. 2. Mild, non-obstructive coronary artery disease involving LAD and dominant LCx. 3. Normal left and right heart filling pressures. 4. Normal Fick cardiac output/index. 5. Right radial artery loop. Recommendations: 1. Transfer to [facility] for cardiac surgery consultation.  If the patient is not a surgical candidate, her LMCA stenosis is treatable percutaneously, though this would  require hemodynamic support given reduced LVEF and left-dominant system. 2. Hold clopidogrel pending cardiac surgery evaluation. 3. Avoid right radial artery access for future catheterization. 4. Aggressive secondary prevention of coronary artery disease. [name], MD [place]     I have independently reviewed the above radiologic studies and discussed with the patient   Recent Lab Findings: Lab Results  Component Value Date   WBC 6.8 08/03/2020   HGB 10.4 (L) 08/03/2020   HCT 32.9 (L) 08/03/2020   PLT 383 08/03/2020   GLUCOSE 117 (H) 08/03/2020   CHOL 104 08/03/2020   TRIG 60 08/03/2020   HDL 32 (L) 08/03/2020   LDLCALC 60 08/03/2020   ALT 13 08/03/2020   AST 14 (L) 08/03/2020   NA 139 08/03/2020   K 3.8 08/03/2020   CL 107 08/03/2020   CREATININE 0.57 08/03/2020   BUN 7 08/03/2020   CO2 24 08/03/2020   TSH 1.251 06/12/2020   INR 0.9 06/12/2020   HGBA1C 7.0 (H) 06/12/2020      Assessment / Plan:      54 year old lady with severe left main coronary artery disease and depressed LV function and a history of cryptogenic strokes.  She is here for a Plavix washout and awaiting surgery.  She appears to be a good candidate for surgery.  I am not sure that waiting till the first part of the week is advisable.  We will complete work-up  today and consider doing her operation over the weekend; she understands this recommendation and wishes to proceed.  She will contact her daughter this afternoon    I  spent 30 minutes counseling the patient face to face.   Jayme Cloud, MD 08/03/2020 11:47 AM

## 2020-08-04 ENCOUNTER — Encounter (HOSPITAL_COMMUNITY): Payer: Self-pay | Admitting: Cardiology

## 2020-08-04 ENCOUNTER — Inpatient Hospital Stay (HOSPITAL_COMMUNITY): Payer: 59

## 2020-08-04 DIAGNOSIS — I10 Essential (primary) hypertension: Secondary | ICD-10-CM

## 2020-08-04 DIAGNOSIS — I5022 Chronic systolic (congestive) heart failure: Secondary | ICD-10-CM | POA: Diagnosis not present

## 2020-08-04 DIAGNOSIS — Z01810 Encounter for preprocedural cardiovascular examination: Secondary | ICD-10-CM | POA: Diagnosis not present

## 2020-08-04 DIAGNOSIS — I639 Cerebral infarction, unspecified: Secondary | ICD-10-CM | POA: Diagnosis not present

## 2020-08-04 DIAGNOSIS — I2510 Atherosclerotic heart disease of native coronary artery without angina pectoris: Secondary | ICD-10-CM | POA: Diagnosis not present

## 2020-08-04 DIAGNOSIS — E119 Type 2 diabetes mellitus without complications: Secondary | ICD-10-CM

## 2020-08-04 DIAGNOSIS — I255 Ischemic cardiomyopathy: Secondary | ICD-10-CM | POA: Diagnosis not present

## 2020-08-04 DIAGNOSIS — E785 Hyperlipidemia, unspecified: Secondary | ICD-10-CM

## 2020-08-04 LAB — GLUCOSE, CAPILLARY
Glucose-Capillary: 127 mg/dL — ABNORMAL HIGH (ref 70–99)
Glucose-Capillary: 192 mg/dL — ABNORMAL HIGH (ref 70–99)
Glucose-Capillary: 171 mg/dL — ABNORMAL HIGH (ref 70–99)

## 2020-08-04 NOTE — Anesthesia Preprocedure Evaluation (Addendum)
Anesthesia Evaluation  Patient identified by MRN, date of birth, ID band Patient awake    Reviewed: Allergy & Precautions, NPO status , Patient's Chart, lab work & pertinent test results, reviewed documented beta blocker date and time   History of Anesthesia Complications Negative for: history of anesthetic complications  Airway Mallampati: II  TM Distance: >3 FB Neck ROM: Full    Dental  (+) Dental Advisory Given   Pulmonary former smoker,  07/28/2020 SARS coronavirus NEG   breath sounds clear to auscultation       Cardiovascular hypertension, Pt. on medications and Pt. on home beta blockers (-) angina+ CAD (1. Severe ostial LMCA stenosis (~80%) with pressure dampening of 54F diagnostic catheter.)   Rhythm:Regular Rate:Normal  07/18/2020 ECHO: EF 30-35%. The left ventricle has moderately decreased function. The left ventricular  internal cavity size was moderately dilated, mild-mod MR   Neuro/Psych CVA negative psych ROS   GI/Hepatic negative GI ROS, Neg liver ROS,   Endo/Other  diabetes (glu 132), Oral Hypoglycemic Agentsobese  Renal/GU negative Renal ROS     Musculoskeletal   Abdominal (+) + obese,   Peds  Hematology  (+) Blood dyscrasia (Hb 10.4), anemia ,   Anesthesia Other Findings   Reproductive/Obstetrics                            Anesthesia Physical Anesthesia Plan  ASA: IV  Anesthesia Plan: General   Post-op Pain Management:    Induction: Intravenous  PONV Risk Score and Plan: 3 and Treatment may vary due to age or medical condition  Airway Management Planned: Oral ETT  Additional Equipment: Arterial line, PA Cath, TEE and Ultrasound Guidance Line Placement  Intra-op Plan:   Post-operative Plan: Post-operative intubation/ventilation  Informed Consent: I have reviewed the patients History and Physical, chart, labs and discussed the procedure including the risks,  benefits and alternatives for the proposed anesthesia with the patient or authorized representative who has indicated his/her understanding and acceptance.     Dental advisory given  Plan Discussed with: CRNA and Surgeon  Anesthesia Plan Comments:        Anesthesia Quick Evaluation

## 2020-08-04 NOTE — Progress Notes (Signed)
Progress Note  Patient Name: Stacey Richardson Date of Encounter: 08/04/2020  [facility] Cardiologist: [name], MD   Subjective   Very anxious. No active chest pain or dyspnea.  Inpatient Medications    Scheduled Meds: . aspirin EC  81 mg Oral Daily  . atorvastatin  80 mg Oral Daily  . ferrous sulfate  325 mg Oral TID WC  . heparin  5,000 Units Subcutaneous Q8H  . insulin aspart  0-9 Units Subcutaneous TID WC  . losartan  50 mg Oral Daily   Continuous Infusions:  PRN Meds: acetaminophen, LORazepam, melatonin, naphazoline-glycerin, nitroGLYCERIN, ondansetron (ZOFRAN) IV   Vital Signs    Vitals:   08/03/20 2100 08/03/20 2200 08/04/20 0639 08/04/20 0814  BP: 112/73  122/77 131/77  Pulse: 93   (!) 104  Resp: 18  18 18   Temp: 97.8 F (36.6 C)  (!) 97.5 F (36.4 C) 98.1 F (36.7 C)  TempSrc: Oral  Oral Oral  SpO2: 100%  98% 100%  Weight:   86.2 kg   Height:  5\' 5"  (1.651 m)      Intake/Output Summary (Last 24 hours) at 08/04/2020 0840 Last data filed at 08/04/2020 0800 Gross per 24 hour  Intake 480 ml  Output --  Net 480 ml   Last 3 Weights 08/04/2020 08/03/2020 08/02/2020  Weight (lbs) 190 lb 189 lb 1.6 oz 195 lb  Weight (kg) 86.183 kg 85.775 kg 88.451 kg      Telemetry    NSR - Personally Reviewed  ECG    None today - Personally Reviewed  Physical Exam   GEN: resting in bed - NAD. Neck: No JVD or bruit Cardiac: RRR, no M/R/G.  Respiratory: CTAB, non-labored, no W/2R/R GI: soft, nt/nd/nabs MS: No C/c/e; cath sites normal Neuro:  no-focal Psych: Normal mood & affect   Labs    High Sensitivity Troponin:   Recent Labs  Lab 07/11/20 1749 07/11/20 2037 07/20/20 1608  TROPONINIHS 4 4 3       Chemistry Recent Labs  Lab 08/02/20 1755 08/03/20 0042  NA  --  139  K  --  3.8  CL  --  107  CO2  --  24  GLUCOSE  --  117*  BUN  --  7  CREATININE 0.61 0.57  CALCIUM  --  8.8*  PROT  --  6.2*  ALBUMIN  --  3.3*  AST  --  14*   ALT  --  13  ALKPHOS  --  38  BILITOT  --  0.3  GFRNONAA >60 >60  ANIONGAP  --  8     Hematology Recent Labs  Lab 08/02/20 1755 08/03/20 0042  WBC 7.9 6.8  RBC 3.75* 3.78*  HGB 10.2* 10.4*  HCT 32.3* 32.9*  MCV 86.1 87.0  MCH 27.2 27.5  MCHC 31.6 31.6  RDW 13.1 13.2  PLT 377 383    BNPNo results for input(s): BNP, PROBNP in the last 168 hours.   DDimer No results for input(s): DDIMER in the last 168 hours.   Radiology    No results found.  Cardiac Studies    Echo 06/13/20: EF 35-40%.  Moderate dysfunction.  Poor visualization of endocardial border -difficult to assess regional wall motion.  Mild LV dilation.  Mild LVH.  Unable to assess diastolic function.  Normal RV size and function.  Mild to moderate MR.    Event monitor 06/19/20: Study Highlights  The patient was monitored for 13 days, 11 hours.  The predominant rhythm was sinus with an average rate of 89 bpm (range 40-166 bpm).  There were rare PAC's and PVC's.  Episodes of Mobitz type 1 second degree AV block were noted.  There was no sustained arrhythmia or prolonged pause, including no evidence of atrial fibrillation/flutter.  Patient triggered events correspond to sinus rhythm, PVC's, PAC's, and Mobitz type 1 second degree AV block.   Predominantly sinus rhythm with rare PAC's and PVC's, as well as episodes of Mobitz type 1 second degree AV block.   TEE 07/18/20: No intracardiac thrombus.  Question small tunnel in the intra-atrial septum but no evidence of right-left shunt (negative bubble study).  EF estimated 30 to 35%.  Moderately dilated LV.  Normal RV.  No LAA thrombus-emptying velocity 67 cm/s.  Mild grade 2 plaque in the descending aorta.    Cardiac Cath 08/02/20:  RIGHT/LEFT HEART CATH AND CORONARY ANGIOGRAPHY  Conclusion 1. Severe ostial LMCA stenosis (~80%) with pressure dampening of 34F diagnostic catheter.  There is no significant improvement with intracoronary nitroglycerin. 2. Mild,  non-obstructive coronary artery disease involving LAD and dominant LCx. 3. Normal left and right heart filling pressures. 4. Normal Fick cardiac output/index. 5. Right radial artery loop.  Recommendations: 1. Transfer to [facility] for cardiac surgery consultation.  If the patient is not a surgical candidate, her LMCA stenosis is treatable percutaneously, though this would require hemodynamic support given reduced LVEF and left-dominant system. 2. Hold clopidogrel pending cardiac surgery evaluation. 3. Avoid right radial artery access for future catheterization. 4. Aggressive secondary prevention of coronary artery disease.  [name], MD [facility]  Recommendations  Antiplatelet/Anticoag Hold clopidogrel pending cardiac surgery evaluation.  Continue indefinite aspirin 81 mg daily.  Discharge Date In the absence of any other complications or medical issues, we expect the patient to be ready for discharge from a cath perspective on 08/02/2020. Transfer to [facility] for cardiac surgery evaluation.  Surgeon Notes No interventions have been documented. Right Heart  Right Heart Pressures RA (mean): 8 mmHg RV (S/EDP): 32/8 mmHg PA (S/D, mean): 27/14 mmHg PCWP (mean): 15 mmHg  Ao sat: 99% PA sat: 75%  Fick CO: 6.0 L/min Fick CI: 3.1 L/min/m^2  Left Heart  Left Ventricle LV end diastolic pressure is normal. LVEDP 10 mmHg.  Aortic Valve There is no aortic valve stenosis.  Coronary Diagrams  Diagnostic This dominance: Left   Patient Profile     54 y.o. female with history of DM, cryptogenic CVA, HTN and HLD presents for surgical evaluation for left main CAD.   Assessment & Plan    Principal Problem:   Left main coronary artery disease Active Problems:   Cardiomyopathy ([place])   Chronic HFrEF (heart failure with reduced ejection fraction) ([facility])   Type 2 diabetes mellitus without complication ([place])   Essential hypertension   Cryptogenic stroke ([facility])    Hyperlipidemia LDL goal <70   1.  Left main CAD:  Currently chest pain-free.   Diagnostic R/LHC at [facility] on 08/02/2020:  Severe ~80% ostial LM CAD (L Dominant System) ==> transferred to [facility] for CVTS Consultation v.s HIGH RISK PCI   Plavix currently on hold  Seen by Dr. Julien Girt yesterday.  Plan is CABG over the weekend to allow for short-term Plavix washout. 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   2.  HFrEF secondary to ICM: EF 35-40%.  Appears well compensated, euvolemic-baseline NYHA class II symptoms R/LHC showed normal Left and Right Heart Filling Pressures with normal Cardiac Output/Index.   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 Recurrent cryptogenic strokes: last on October 4.  No new neurologic deficits.    Outpatient cardiac monitoring without evidence of A. fib/flutter.  TEE without evidence of intracardiac thrombus or shunting though suspicion for PFO remains given her age and recurrent strokes as well as possible tunnel noted in the interatrial septum on TEE.    Referred to Neuro to [facility]   Consider ILR by EP.   Plavix being held for CABG (check P2Y12 Inhibition Assay)  4. HTN: Blood pressure is controlled.  Continue losartan with plans to potentially convert to [facility] prior to discharge.  5. HLD-goal LDL<70: LDL of 207 from 06/2020 while not on statin therapy at that time.    Now on 80 mg atorvastatin with repeat LDL down to 60.    6. DM2: Last A1c 7.0 from 06/2020.    On Metformin at home.  Currently on hold  SSI  Consider starting SGLT2 inhibitor on discharge given reduced EF, CAD and  DM-2   Dispo: Patient was seen yesterday by Dr. Julien Girt.  Felt to be a good candidate for surgery.  Also felt that prolonged delay for Plavix washout would be not advisable based on recent stroke.  Plan was to complete inpatient work-up and consider planning to operate over the weekend.   For questions or updates, please contact [place] Please consult [email or web address] for contact info under        Signed, Glenetta Hew, MD  08/04/2020, 8:40 AM

## 2020-08-04 NOTE — Progress Notes (Signed)
Pre-CABG testing has been completed. Preliminary results can be found in CV Proc through chart review.   08/04/20 5:17 PM Stacey Richardson RVT

## 2020-08-04 NOTE — Progress Notes (Signed)
TCTS PN  Stable day; her spirits seem better  Plan OR for CABG on Sunday.  Stacey Richardson Z. [name], [name]

## 2020-08-04 NOTE — Progress Notes (Signed)
[phone] Discussed sternal precautions and staying in the tube. Gave handout. Gave IS and pt demonstrated 2000 ml. Has OHS booklet. Gave care guide, and wrote down how to view pre op video. Pt stated daughter will be able to assist in care after discharge. Voiced understanding of ed. Emotional support given.  Will f/up after surgery. Graylon Good RN BSN 08/04/2020 10:02 AM

## 2020-08-05 ENCOUNTER — Inpatient Hospital Stay (HOSPITAL_COMMUNITY): Payer: 59

## 2020-08-05 DIAGNOSIS — I2510 Atherosclerotic heart disease of native coronary artery without angina pectoris: Secondary | ICD-10-CM | POA: Diagnosis not present

## 2020-08-05 LAB — ABO/RH: ABO/RH(D): B POS

## 2020-08-05 LAB — URINALYSIS, ROUTINE W REFLEX MICROSCOPIC
Bacteria, UA: NONE SEEN
Bilirubin Urine: NEGATIVE
Glucose, UA: NEGATIVE mg/dL
Ketones, ur: NEGATIVE mg/dL
Leukocytes,Ua: NEGATIVE
Nitrite: NEGATIVE
Protein, ur: NEGATIVE mg/dL
Specific Gravity, Urine: 1.013 (ref 1.005–1.030)
pH: 5 (ref 5.0–8.0)

## 2020-08-05 LAB — GLUCOSE, CAPILLARY
Glucose-Capillary: 254 mg/dL — ABNORMAL HIGH (ref 70–99)
Glucose-Capillary: 174 mg/dL — ABNORMAL HIGH (ref 70–99)
Glucose-Capillary: 97 mg/dL (ref 70–99)
Glucose-Capillary: 196 mg/dL — ABNORMAL HIGH (ref 70–99)

## 2020-08-05 LAB — BLOOD GAS, ARTERIAL
Acid-base deficit: 0.7 mmol/L (ref 0.0–2.0)
Bicarbonate: 23.2 mmol/L (ref 20.0–28.0)
Drawn by: 441371
FIO2: 21
O2 Saturation: 97.6 %
Patient temperature: 36.5
pCO2 arterial: 35.9 mmHg (ref 32.0–48.0)
pH, Arterial: 7.424 (ref 7.350–7.450)
pO2, Arterial: 93.8 mmHg (ref 83.0–108.0)

## 2020-08-05 LAB — APTT: aPTT: 35 seconds (ref 24–36)

## 2020-08-05 LAB — HEMOGLOBIN A1C
Hgb A1c MFr Bld: 6.2 % — ABNORMAL HIGH (ref 4.8–5.6)
Mean Plasma Glucose: 131.24 mg/dL

## 2020-08-05 LAB — PROTIME-INR
INR: 1 (ref 0.8–1.2)
Prothrombin Time: 12.6 seconds (ref 11.4–15.2)

## 2020-08-05 LAB — SURGICAL PCR SCREEN
MRSA, PCR: NEGATIVE
Staphylococcus aureus: NEGATIVE

## 2020-08-05 MED ORDER — PHENYLEPHRINE HCL-NACL 20-0.9 MG/250ML-% IV SOLN
30.0000 ug/min | INTRAVENOUS | Status: DC
  Administered 2020-08-06: 20 ug/min via INTRAVENOUS
  Filled 2020-08-05: qty 250

## 2020-08-05 MED ORDER — TRANEXAMIC ACID 1000 MG/10ML IV SOLN
1.5000 mg/kg/h | INTRAVENOUS | Status: DC
  Administered 2020-08-06: 1.5 mg/kg/h via INTRAVENOUS
  Filled 2020-08-05: qty 25

## 2020-08-05 MED ORDER — VANCOMYCIN HCL 1500 MG/300ML IV SOLN
1500.0000 mg | INTRAVENOUS | Status: DC
  Administered 2020-08-06: 1500 mg via INTRAVENOUS
  Filled 2020-08-05: qty 300

## 2020-08-05 MED ORDER — MILRINONE LACTATE IN DEXTROSE 20-5 MG/100ML-% IV SOLN
0.3000 ug/kg/min | INTRAVENOUS | Status: DC
  Filled 2020-08-05: qty 100

## 2020-08-05 MED ORDER — CHLORHEXIDINE GLUCONATE CLOTH 2 % EX PADS
6.0000 | MEDICATED_PAD | Freq: Once | CUTANEOUS | Status: AC
  Administered 2020-08-05: 6 via TOPICAL

## 2020-08-05 MED ORDER — METOPROLOL TARTRATE 12.5 MG HALF TABLET
12.5000 mg | ORAL_TABLET | Freq: Once | ORAL | Status: AC
  Administered 2020-08-06: 12.5 mg via ORAL
  Filled 2020-08-05: qty 1

## 2020-08-05 MED ORDER — PLASMA-LYTE 148 IV SOLN
INTRAVENOUS | Status: DC
  Filled 2020-08-05: qty 2.5

## 2020-08-05 MED ORDER — EPINEPHRINE HCL 5 MG/250ML IV SOLN IN NS
0.0000 ug/min | INTRAVENOUS | Status: DC
  Filled 2020-08-05: qty 250

## 2020-08-05 MED ORDER — TRANEXAMIC ACID (OHS) PUMP PRIME SOLUTION
2.0000 mg/kg | INTRAVENOUS | Status: DC
  Filled 2020-08-05: qty 1.7

## 2020-08-05 MED ORDER — BISACODYL 5 MG PO TBEC: 5.0000 mg | DELAYED_RELEASE_TABLET | Freq: Once | ORAL | Status: DC

## 2020-08-05 MED ORDER — TEMAZEPAM 15 MG PO CAPS: 15.0000 mg | ORAL_CAPSULE | Freq: Once | ORAL | Status: DC | PRN

## 2020-08-05 MED ORDER — CHLORHEXIDINE GLUCONATE CLOTH 2 % EX PADS
6.0000 | MEDICATED_PAD | Freq: Once | CUTANEOUS | Status: AC
  Administered 2020-08-06: 6 via TOPICAL

## 2020-08-05 MED ORDER — TRANEXAMIC ACID (OHS) BOLUS VIA INFUSION
15.0000 mg/kg | INTRAVENOUS | Status: DC
  Administered 2020-08-06: 1278 mg via INTRAVENOUS
  Filled 2020-08-05: qty 1278

## 2020-08-05 MED ORDER — INSULIN REGULAR(HUMAN) IN NACL 100-0.9 UT/100ML-% IV SOLN
INTRAVENOUS | Status: DC
  Administered 2020-08-06: 6.5 [IU]/h via INTRAVENOUS
  Filled 2020-08-05: qty 100

## 2020-08-05 MED ORDER — CHLORHEXIDINE GLUCONATE 0.12 % MT SOLN
15.0000 mL | Freq: Once | OROMUCOSAL | Status: AC
  Administered 2020-08-06: 15 mL via OROMUCOSAL
  Filled 2020-08-05: qty 15

## 2020-08-05 MED ORDER — MAGNESIUM SULFATE 50 % IJ SOLN
40.0000 meq | INTRAMUSCULAR | Status: DC
  Filled 2020-08-05: qty 9.85

## 2020-08-05 MED ORDER — NITROGLYCERIN IN D5W 200-5 MCG/ML-% IV SOLN
2.0000 ug/min | INTRAVENOUS | Status: DC
  Administered 2020-08-06: 5 ug/min via INTRAVENOUS
  Filled 2020-08-05: qty 250

## 2020-08-05 MED ORDER — POTASSIUM CHLORIDE 2 MEQ/ML IV SOLN
80.0000 meq | INTRAVENOUS | Status: DC
  Filled 2020-08-05: qty 40

## 2020-08-05 MED ORDER — SODIUM CHLORIDE 0.9 % IV SOLN
750.0000 mg | INTRAVENOUS | Status: DC
  Administered 2020-08-06: 750 mg via INTRAVENOUS
  Filled 2020-08-05: qty 750

## 2020-08-05 MED ORDER — SODIUM CHLORIDE 0.9 % IV SOLN
INTRAVENOUS | Status: DC
  Filled 2020-08-05: qty 30

## 2020-08-05 MED ORDER — NOREPINEPHRINE 4 MG/250ML-% IV SOLN
0.0000 ug/min | INTRAVENOUS | Status: DC
  Filled 2020-08-05: qty 250

## 2020-08-05 MED ORDER — DEXMEDETOMIDINE HCL IN NACL 400 MCG/100ML IV SOLN
0.1000 ug/kg/h | INTRAVENOUS | Status: DC
  Administered 2020-08-06: .3 ug/kg/h via INTRAVENOUS
  Filled 2020-08-05: qty 100

## 2020-08-05 MED ORDER — SODIUM CHLORIDE 0.9 % IV SOLN
1.5000 g | INTRAVENOUS | Status: DC
  Administered 2020-08-06: 1.5 g via INTRAVENOUS
  Filled 2020-08-05: qty 1.5

## 2020-08-05 NOTE — Progress Notes (Signed)
Progress Note  Patient Name: Stacey Richardson Date of Encounter: 08/05/2020  [facility] Cardiologist: [name], MD   Subjective   Very anxious but feeling well.  No chest pain or shortness of breath.  Became tachycardic this morning, but all sinus tachycardia.  Inpatient Medications    Scheduled Meds: . aspirin EC  81 mg Oral Daily  . atorvastatin  80 mg Oral Daily  . ferrous sulfate  325 mg Oral TID WC  . heparin  5,000 Units Subcutaneous Q8H  . insulin aspart  0-9 Units Subcutaneous TID WC  . losartan  50 mg Oral Daily   Continuous Infusions:  PRN Meds: acetaminophen, LORazepam, melatonin, naphazoline-glycerin, nitroGLYCERIN, ondansetron (ZOFRAN) IV   Vital Signs    Vitals:   08/04/20 1403 08/04/20 2100 08/04/20 2141 08/05/20 0629  BP: 123/69  119/71 123/76  Pulse: 89   75  Resp: 18  18 18   Temp: 98.1 F (36.7 C) 97.8 F (36.6 C)  97.6 F (36.4 C)  TempSrc: Oral Oral  Oral  SpO2: 100%   98%  Weight:    85.2 kg  Height:       No intake or output data in the 24 hours ending 08/05/20 0850 Last 3 Weights 08/05/2020 08/04/2020 08/03/2020  Weight (lbs) 187 lb 12.8 oz 190 lb 189 lb 1.6 oz  Weight (kg) 85.186 kg 86.183 kg 85.775 kg      Telemetry    Sinus rhythm with sinus tachycardia -personally reviewed  ECG    None new  Physical Exam   GEN: Well nourished, well developed, in no acute distress  HEENT: normal  Neck: no JVD, carotid bruits, or masses Cardiac: RRR; no murmurs, rubs, or gallops,no edema  Respiratory:  clear to auscultation bilaterally, normal work of breathing GI: soft, nontender, nondistended, + BS MS: no deformity or atrophy  Skin: warm and dry Neuro:  Strength and sensation are intact Psych: euthymic mood, full affect   Labs    High Sensitivity Troponin:   Recent Labs  Lab 07/11/20 1749 07/11/20 2037 07/20/20 1608  TROPONINIHS 4 4 3       Chemistry Recent Labs  Lab 08/02/20 1755 08/03/20 0042  NA  --  139   K  --  3.8  CL  --  107  CO2  --  24  GLUCOSE  --  117*  BUN  --  7  CREATININE 0.61 0.57  CALCIUM  --  8.8*  PROT  --  6.2*  ALBUMIN  --  3.3*  AST  --  14*  ALT  --  13  ALKPHOS  --  38  BILITOT  --  0.3  GFRNONAA >60 >60  ANIONGAP  --  8     Hematology Recent Labs  Lab 08/02/20 1755 08/03/20 0042  WBC 7.9 6.8  RBC 3.75* 3.78*  HGB 10.2* 10.4*  HCT 32.3* 32.9*  MCV 86.1 87.0  MCH 27.2 27.5  MCHC 31.6 31.6  RDW 13.1 13.2  PLT 377 383    BNPNo results for input(s): BNP, PROBNP in the last 168 hours.   DDimer No results for input(s): DDIMER in the last 168 hours.   Radiology    VAS US DOPPLER PRE CABG  Result Date: 08/04/2020 PREOPERATIVE VASCULAR EVALUATION  Indications:      Pre-CABG. Risk Factors:     Hypertension, Diabetes. Comparison Study: No prior studies. Performing Technologist: Carlos Levering RVT  Examination Guidelines: A complete evaluation includes B-mode imaging, spectral Doppler, color  Doppler, and power Doppler as needed of all accessible portions of each vessel. Bilateral testing is considered an integral part of a complete examination. Limited examinations for reoccurring indications may be performed as noted.  Right Carotid Findings: +----------+--------+--------+--------+-----------------------+--------+           PSV cm/sEDV cm/sStenosisDescribe               Comments +----------+--------+--------+--------+-----------------------+--------+ CCA Prox  101     20              smooth and heterogenous         +----------+--------+--------+--------+-----------------------+--------+ CCA Distal85      20              smooth and heterogenous         +----------+--------+--------+--------+-----------------------+--------+ ICA Prox  52      22              smooth and heterogenous         +----------+--------+--------+--------+-----------------------+--------+ ICA Distal88      26                                     tortuous  +----------+--------+--------+--------+-----------------------+--------+ ECA       59      7                                               +----------+--------+--------+--------+-----------------------+--------+ Portions of this table do not appear on this page. +----------+--------+-------+--------+------------+           PSV cm/sEDV cmsDescribeArm Pressure +----------+--------+-------+--------+------------+ Subclavian148                                 +----------+--------+-------+--------+------------+ +---------+--------+--+--------+--+---------+ VertebralPSV cm/s58EDV cm/s24Antegrade +---------+--------+--+--------+--+---------+ Left Carotid Findings: +----------+--------+--------+--------+-----------------------+--------+           PSV cm/sEDV cm/sStenosisDescribe               Comments +----------+--------+--------+--------+-----------------------+--------+ CCA Prox  117     30              smooth and heterogenous         +----------+--------+--------+--------+-----------------------+--------+ CCA Distal96      36              smooth and heterogenous         +----------+--------+--------+--------+-----------------------+--------+ ICA Prox  58      24              smooth and heterogenous         +----------+--------+--------+--------+-----------------------+--------+ ICA Distal89      41                                     tortuous +----------+--------+--------+--------+-----------------------+--------+ ECA       60      9                                               +----------+--------+--------+--------+-----------------------+--------+ +----------+--------+--------+--------+------------+ SubclavianPSV cm/sEDV cm/sDescribeArm Pressure +----------+--------+--------+--------+------------+  179                                  +----------+--------+--------+--------+------------+ +---------+--------+--+--------+-+---------+  VertebralPSV cm/s41EDV cm/s5Antegrade +---------+--------+--+--------+-+---------+  ABI Findings: +--------+------------------+-----+---------+--------+ Right   Rt Pressure (mmHg)IndexWaveform Comment  +--------+------------------+-----+---------+--------+ [id number]                    triphasic         +--------+------------------+-----+---------+--------+ PTA     133               0.98 triphasic         +--------+------------------+-----+---------+--------+ DP      155               1.14 triphasic         +--------+------------------+-----+---------+--------+ +--------+------------------+-----+---------+-------+ Left    Lt Pressure (mmHg)IndexWaveform Comment +--------+------------------+-----+---------+-------+ [id number]                    triphasic        +--------+------------------+-----+---------+-------+ PTA     133               0.98 triphasic        +--------+------------------+-----+---------+-------+ DP      154               1.13 triphasic        +--------+------------------+-----+---------+-------+ +-------+---------------+----------------+ ABI/TBIToday's ABI/TBIPrevious ABI/TBI +-------+---------------+----------------+ Right  1.14                            +-------+---------------+----------------+ Left   1.13                            +-------+---------------+----------------+  Right Doppler Findings: +--------+--------+-----+---------+--------+ Site    PressureIndexDoppler  Comments +--------+--------+-----+---------+--------+ [id number]          triphasic         +--------+--------+-----+---------+--------+ Radial               triphasic         +--------+--------+-----+---------+--------+ Ulnar                triphasic         +--------+--------+-----+---------+--------+  Left Doppler Findings: +--------+--------+-----+---------+--------+ Site    PressureIndexDoppler  Comments  +--------+--------+-----+---------+--------+ [id number]          triphasic         +--------+--------+-----+---------+--------+ Radial               triphasic         +--------+--------+-----+---------+--------+ Ulnar                triphasic         +--------+--------+-----+---------+--------+  Summary: Right Carotid: Velocities in the right ICA are consistent with a 1-39% stenosis. Left Carotid: Velocities in the left ICA are consistent with a 1-39% stenosis. Vertebrals: Bilateral vertebral arteries demonstrate antegrade flow. Right ABI: Resting right ankle-brachial index indicates noncompressible right lower extremity arteries. Left ABI: Resting left ankle-brachial index indicates noncompressible left lower extremity arteries. Right Upper Extremity: Doppler waveforms decrease >50% with right radial compression. Doppler waveform obliterate with right ulnar compression. Left Upper Extremity: Doppler waveform obliterate with left radial compression. Doppler waveform obliterate with left ulnar compression.    Preliminary     Cardiac Studies    Echo 06/13/20: EF 35-40%.  Moderate dysfunction.  Poor  visualization of endocardial border -difficult to assess regional wall motion.  Mild LV dilation.  Mild LVH.  Unable to assess diastolic function.  Normal RV size and function.  Mild to moderate MR.    Event monitor 06/19/20: Study Highlights  The patient was monitored for 13 days, 11 hours.  The predominant rhythm was sinus with an average rate of 89 bpm (range 40-166 bpm).  There were rare PAC's and PVC's.  Episodes of Mobitz type 1 second degree AV block were noted.  There was no sustained arrhythmia or prolonged pause, including no evidence of atrial fibrillation/flutter.  Patient triggered events correspond to sinus rhythm, PVC's, PAC's, and Mobitz type 1 second degree AV block.   Predominantly sinus rhythm with rare PAC's and PVC's, as well as episodes of Mobitz type 1 second  degree AV block.   TEE 07/18/20: No intracardiac thrombus.  Question small tunnel in the intra-atrial septum but no evidence of right-left shunt (negative bubble study).  EF estimated 30 to 35%.  Moderately dilated LV.  Normal RV.  No LAA thrombus-emptying velocity 67 cm/s.  Mild grade 2 plaque in the descending aorta.    Cardiac Cath 08/02/20:  RIGHT/LEFT HEART CATH AND CORONARY ANGIOGRAPHY  Conclusion 1. Severe ostial LMCA stenosis (~80%) with pressure dampening of 48F diagnostic catheter.  There is no significant improvement with intracoronary nitroglycerin. 2. Mild, non-obstructive coronary artery disease involving LAD and dominant LCx. 3. Normal left and right heart filling pressures. 4. Normal Fick cardiac output/index. 5. Right radial artery loop.  Recommendations: 1. Transfer to [facility] for cardiac surgery consultation.  If the patient is not a surgical candidate, her LMCA stenosis is treatable percutaneously, though this would require hemodynamic support given reduced LVEF and left-dominant system. 2. Hold clopidogrel pending cardiac surgery evaluation. 3. Avoid right radial artery access for future catheterization. 4. Aggressive secondary prevention of coronary artery disease.  [name], MD [facility]  Recommendations  Antiplatelet/Anticoag Hold clopidogrel pending cardiac surgery evaluation.  Continue indefinite aspirin 81 mg daily.  Discharge Date In the absence of any other complications or medical issues, we expect the patient to be ready for discharge from a cath perspective on 08/02/2020. Transfer to [facility] for cardiac surgery evaluation.  Surgeon Notes No interventions have been documented. Right Heart  Right Heart Pressures RA (mean): 8 mmHg RV (S/EDP): 32/8 mmHg PA (S/D, mean): 27/14 mmHg PCWP (mean): 15 mmHg  Ao sat: 99% PA sat: 75%  Fick CO: 6.0 L/min Fick CI: 3.1 L/min/m^2  Left Heart  Left Ventricle LV end diastolic pressure is  normal. LVEDP 10 mmHg.  Aortic Valve There is no aortic valve stenosis.  Coronary Diagrams  Diagnostic This dominance: Left   Patient Profile     54 y.o. female with history of DM, cryptogenic CVA, HTN and HLD presents for surgical evaluation for left main CAD.   Assessment & Plan    Principal Problem:   Left main coronary artery disease Active Problems:   Type 2 diabetes mellitus without complication (HCC)   Essential hypertension   Cryptogenic stroke (HCC)   Cardiomyopathy ([place])   Hyperlipidemia LDL goal <70   Chronic HFrEF (heart failure with reduced ejection fraction) ([place])   1.  Left main CAD: Currently chest pain-free.  Right and left heart catheterization showed a severe 80% ostial left main.  Plavix is on hold.  Cardiac surgery has been consulted and they are planning for CABG tomorrow morning.  We Ty Buntrock continue aspirin, losartan.  We Naziah Weckerly  hold beta-blocker as has had Wenckebach in the past.  She did have some tachycardia, but this all appeared to be sinus tachycardia, likely due to anxiety this morning.   2. HFrEF secondary to ICM: EF 35-40%.  Appears well compensated with NYHA class II symptoms.  Cressida Milford likely need Entresto postop.  We Jihaad Bruschi also potentially be able to start low-dose beta-blocker.  No obvious volume overload. 3. Recurrent cryptogenic strokes: Most recent on October 4.  No new neurologic deficits.    4. HTN: Blood pressure well controlled.   HLD-goal LDL<70: LDL of 207.  Continue high-dose atorvastatin.   DM2: Last A1c was 7.  Currently on metformin at home.  Continue with sliding scale insulin.  Nithya Meriweather likely need SGLT2 inhibitor at discharge.     For questions or updates, please contact [place] Please consult [email or web address] for contact info under        Signed, Babette Stum Meredith Leeds, MD  08/05/2020, 8:50 AM

## 2020-08-06 ENCOUNTER — Inpatient Hospital Stay (HOSPITAL_COMMUNITY): Payer: 59 | Admitting: Anesthesiology

## 2020-08-06 ENCOUNTER — Inpatient Hospital Stay (HOSPITAL_COMMUNITY): Payer: 59

## 2020-08-06 ENCOUNTER — Inpatient Hospital Stay (HOSPITAL_COMMUNITY)
Admission: AD | Disposition: A | Discharge status: Home / Self Care | Payer: Self-pay | Source: Other Acute Inpatient Hospital | Attending: Cardiology

## 2020-08-06 DIAGNOSIS — I25110 Atherosclerotic heart disease of native coronary artery with unstable angina pectoris: Secondary | ICD-10-CM | POA: Diagnosis not present

## 2020-08-06 HISTORY — PX: CLIPPING OF ATRIAL APPENDAGE: SHX5773

## 2020-08-06 HISTORY — PX: TEE WITHOUT CARDIOVERSION: SHX5443

## 2020-08-06 HISTORY — PX: CORONARY ARTERY BYPASS GRAFT: SHX141

## 2020-08-06 LAB — CBC
HCT: 33.6 % — ABNORMAL LOW (ref 36.0–46.0)
HCT: 30.7 % — ABNORMAL LOW (ref 36.0–46.0)
HCT: 30.7 % — ABNORMAL LOW (ref 36.0–46.0)
Hemoglobin: 10.5 g/dL — ABNORMAL LOW (ref 12.0–15.0)
Hemoglobin: 9.8 g/dL — ABNORMAL LOW (ref 12.0–15.0)
Hemoglobin: 9.8 g/dL — ABNORMAL LOW (ref 12.0–15.0)
MCH: 27.5 pg (ref 26.0–34.0)
MCH: 28 pg (ref 26.0–34.0)
MCH: 27.9 pg (ref 26.0–34.0)
MCHC: 31.3 g/dL (ref 30.0–36.0)
MCHC: 31.9 g/dL (ref 30.0–36.0)
MCHC: 31.9 g/dL (ref 30.0–36.0)
MCV: 88 fL (ref 80.0–100.0)
MCV: 87.7 fL (ref 80.0–100.0)
MCV: 87.5 fL (ref 80.0–100.0)
Platelets: 393 10*3/uL (ref 150–400)
Platelets: 275 10*3/uL (ref 150–400)
Platelets: 284 10*3/uL (ref 150–400)
RBC: 3.82 MIL/uL — ABNORMAL LOW (ref 3.87–5.11)
RBC: 3.5 MIL/uL — ABNORMAL LOW (ref 3.87–5.11)
RBC: 3.51 MIL/uL — ABNORMAL LOW (ref 3.87–5.11)
RDW: 13.2 % (ref 11.5–15.5)
RDW: 13.6 % (ref 11.5–15.5)
RDW: 13.5 % (ref 11.5–15.5)
WBC: 9.3 10*3/uL (ref 4.0–10.5)
WBC: 20.8 10*3/uL — ABNORMAL HIGH (ref 4.0–10.5)
WBC: 18.4 10*3/uL — ABNORMAL HIGH (ref 4.0–10.5)
nRBC: 0 % (ref 0.0–0.2)
nRBC: 0 % (ref 0.0–0.2)
nRBC: 0 % (ref 0.0–0.2)

## 2020-08-06 LAB — POCT I-STAT, CHEM 8
BUN: 7 mg/dL (ref 6–20)
BUN: 7 mg/dL (ref 6–20)
BUN: 6 mg/dL (ref 6–20)
BUN: 5 mg/dL — ABNORMAL LOW (ref 6–20)
Calcium, Ion: 1.28 mmol/L (ref 1.15–1.40)
Calcium, Ion: 1.26 mmol/L (ref 1.15–1.40)
Calcium, Ion: 1.08 mmol/L — ABNORMAL LOW (ref 1.15–1.40)
Calcium, Ion: 1.07 mmol/L — ABNORMAL LOW (ref 1.15–1.40)
Chloride: 102 mmol/L (ref 98–111)
Chloride: 104 mmol/L (ref 98–111)
Chloride: 104 mmol/L (ref 98–111)
Chloride: 103 mmol/L (ref 98–111)
Creatinine, Ser: 0.4 mg/dL — ABNORMAL LOW (ref 0.44–1.00)
Creatinine, Ser: 0.4 mg/dL — ABNORMAL LOW (ref 0.44–1.00)
Creatinine, Ser: 0.4 mg/dL — ABNORMAL LOW (ref 0.44–1.00)
Creatinine, Ser: 0.4 mg/dL — ABNORMAL LOW (ref 0.44–1.00)
Glucose, Bld: 178 mg/dL — ABNORMAL HIGH (ref 70–99)
Glucose, Bld: 112 mg/dL — ABNORMAL HIGH (ref 70–99)
Glucose, Bld: 96 mg/dL (ref 70–99)
Glucose, Bld: 164 mg/dL — ABNORMAL HIGH (ref 70–99)
HCT: 33 % — ABNORMAL LOW (ref 36.0–46.0)
HCT: 30 % — ABNORMAL LOW (ref 36.0–46.0)
HCT: 22 % — ABNORMAL LOW (ref 36.0–46.0)
HCT: 24 % — ABNORMAL LOW (ref 36.0–46.0)
Hemoglobin: 11.2 g/dL — ABNORMAL LOW (ref 12.0–15.0)
Hemoglobin: 10.2 g/dL — ABNORMAL LOW (ref 12.0–15.0)
Hemoglobin: 7.5 g/dL — ABNORMAL LOW (ref 12.0–15.0)
Hemoglobin: 8.2 g/dL — ABNORMAL LOW (ref 12.0–15.0)
Potassium: 4 mmol/L (ref 3.5–5.1)
Potassium: 3.9 mmol/L (ref 3.5–5.1)
Potassium: 4.4 mmol/L (ref 3.5–5.1)
Potassium: 4.3 mmol/L (ref 3.5–5.1)
Sodium: 139 mmol/L (ref 135–145)
Sodium: 140 mmol/L (ref 135–145)
Sodium: 141 mmol/L (ref 135–145)
Sodium: 142 mmol/L (ref 135–145)
TCO2: 26 mmol/L (ref 22–32)
TCO2: 27 mmol/L (ref 22–32)
TCO2: 26 mmol/L (ref 22–32)
TCO2: 26 mmol/L (ref 22–32)

## 2020-08-06 LAB — POCT I-STAT EG7
Acid-Base Excess: 0 mmol/L (ref 0.0–2.0)
Bicarbonate: 25.3 mmol/L (ref 20.0–28.0)
Calcium, Ion: 1.08 mmol/L — ABNORMAL LOW (ref 1.15–1.40)
HCT: 22 % — ABNORMAL LOW (ref 36.0–46.0)
Hemoglobin: 7.5 g/dL — ABNORMAL LOW (ref 12.0–15.0)
O2 Saturation: 76 %
Potassium: 3.7 mmol/L (ref 3.5–5.1)
Sodium: 142 mmol/L (ref 135–145)
TCO2: 27 mmol/L (ref 22–32)
pCO2, Ven: 41.7 mmHg — ABNORMAL LOW (ref 44.0–60.0)
pH, Ven: 7.392 (ref 7.250–7.430)
pO2, Ven: 42 mmHg (ref 32.0–45.0)

## 2020-08-06 LAB — POCT I-STAT 7, (LYTES, BLD GAS, ICA,H+H)
Acid-Base Excess: 0 mmol/L (ref 0.0–2.0)
Acid-Base Excess: 3 mmol/L — ABNORMAL HIGH (ref 0.0–2.0)
Acid-Base Excess: 0 mmol/L (ref 0.0–2.0)
Acid-Base Excess: 1 mmol/L (ref 0.0–2.0)
Acid-base deficit: 3 mmol/L — ABNORMAL HIGH (ref 0.0–2.0)
Acid-base deficit: 6 mmol/L — ABNORMAL HIGH (ref 0.0–2.0)
Acid-base deficit: 6 mmol/L — ABNORMAL HIGH (ref 0.0–2.0)
Bicarbonate: 26.3 mmol/L (ref 20.0–28.0)
Bicarbonate: 27.5 mmol/L (ref 20.0–28.0)
Bicarbonate: 25.3 mmol/L (ref 20.0–28.0)
Bicarbonate: 25.6 mmol/L (ref 20.0–28.0)
Bicarbonate: 22.3 mmol/L (ref 20.0–28.0)
Bicarbonate: 19.4 mmol/L — ABNORMAL LOW (ref 20.0–28.0)
Bicarbonate: 19.9 mmol/L — ABNORMAL LOW (ref 20.0–28.0)
Calcium, Ion: 1.25 mmol/L (ref 1.15–1.40)
Calcium, Ion: 0.98 mmol/L — ABNORMAL LOW (ref 1.15–1.40)
Calcium, Ion: 1.06 mmol/L — ABNORMAL LOW (ref 1.15–1.40)
Calcium, Ion: 1.09 mmol/L — ABNORMAL LOW (ref 1.15–1.40)
Calcium, Ion: 1.09 mmol/L — ABNORMAL LOW (ref 1.15–1.40)
Calcium, Ion: 1.08 mmol/L — ABNORMAL LOW (ref 1.15–1.40)
Calcium, Ion: 1.11 mmol/L — ABNORMAL LOW (ref 1.15–1.40)
HCT: 33 % — ABNORMAL LOW (ref 36.0–46.0)
HCT: 22 % — ABNORMAL LOW (ref 36.0–46.0)
HCT: 22 % — ABNORMAL LOW (ref 36.0–46.0)
HCT: 26 % — ABNORMAL LOW (ref 36.0–46.0)
HCT: 30 % — ABNORMAL LOW (ref 36.0–46.0)
HCT: 33 % — ABNORMAL LOW (ref 36.0–46.0)
HCT: 28 % — ABNORMAL LOW (ref 36.0–46.0)
Hemoglobin: 11.2 g/dL — ABNORMAL LOW (ref 12.0–15.0)
Hemoglobin: 7.5 g/dL — ABNORMAL LOW (ref 12.0–15.0)
Hemoglobin: 7.5 g/dL — ABNORMAL LOW (ref 12.0–15.0)
Hemoglobin: 8.8 g/dL — ABNORMAL LOW (ref 12.0–15.0)
Hemoglobin: 10.2 g/dL — ABNORMAL LOW (ref 12.0–15.0)
Hemoglobin: 11.2 g/dL — ABNORMAL LOW (ref 12.0–15.0)
Hemoglobin: 9.5 g/dL — ABNORMAL LOW (ref 12.0–15.0)
O2 Saturation: 100 %
O2 Saturation: 100 %
O2 Saturation: 100 %
O2 Saturation: 100 %
O2 Saturation: 100 %
O2 Saturation: 99 %
O2 Saturation: 99 %
Patient temperature: 36.2
Patient temperature: 37.1
Patient temperature: 37.1
Potassium: 4 mmol/L (ref 3.5–5.1)
Potassium: 4.1 mmol/L (ref 3.5–5.1)
Potassium: 4.3 mmol/L (ref 3.5–5.1)
Potassium: 5.2 mmol/L — ABNORMAL HIGH (ref 3.5–5.1)
Potassium: 3.9 mmol/L (ref 3.5–5.1)
Potassium: 4.8 mmol/L (ref 3.5–5.1)
Potassium: 4.5 mmol/L (ref 3.5–5.1)
Sodium: 139 mmol/L (ref 135–145)
Sodium: 143 mmol/L (ref 135–145)
Sodium: 141 mmol/L (ref 135–145)
Sodium: 142 mmol/L (ref 135–145)
Sodium: 142 mmol/L (ref 135–145)
Sodium: 139 mmol/L (ref 135–145)
Sodium: 139 mmol/L (ref 135–145)
TCO2: 28 mmol/L (ref 22–32)
TCO2: 29 mmol/L (ref 22–32)
TCO2: 26 mmol/L (ref 22–32)
TCO2: 27 mmol/L (ref 22–32)
TCO2: 23 mmol/L (ref 22–32)
TCO2: 20 mmol/L — ABNORMAL LOW (ref 22–32)
TCO2: 21 mmol/L — ABNORMAL LOW (ref 22–32)
pCO2 arterial: 47.7 mmHg (ref 32.0–48.0)
pCO2 arterial: 43.8 mmHg (ref 32.0–48.0)
pCO2 arterial: 39.1 mmHg (ref 32.0–48.0)
pCO2 arterial: 44.4 mmHg (ref 32.0–48.0)
pCO2 arterial: 37.4 mmHg (ref 32.0–48.0)
pCO2 arterial: 37 mmHg (ref 32.0–48.0)
pCO2 arterial: 38.5 mmHg (ref 32.0–48.0)
pH, Arterial: 7.348 — ABNORMAL LOW (ref 7.350–7.450)
pH, Arterial: 7.405 (ref 7.350–7.450)
pH, Arterial: 7.369 (ref 7.350–7.450)
pH, Arterial: 7.418 (ref 7.350–7.450)
pH, Arterial: 7.38 (ref 7.350–7.450)
pH, Arterial: 7.328 — ABNORMAL LOW (ref 7.350–7.450)
pH, Arterial: 7.321 — ABNORMAL LOW (ref 7.350–7.450)
pO2, Arterial: 469 mmHg — ABNORMAL HIGH (ref 83.0–108.0)
pO2, Arterial: 361 mmHg — ABNORMAL HIGH (ref 83.0–108.0)
pO2, Arterial: 347 mmHg — ABNORMAL HIGH (ref 83.0–108.0)
pO2, Arterial: 355 mmHg — ABNORMAL HIGH (ref 83.0–108.0)
pO2, Arterial: 180 mmHg — ABNORMAL HIGH (ref 83.0–108.0)
pO2, Arterial: 133 mmHg — ABNORMAL HIGH (ref 83.0–108.0)
pO2, Arterial: 135 mmHg — ABNORMAL HIGH (ref 83.0–108.0)

## 2020-08-06 LAB — BASIC METABOLIC PANEL
Anion gap: 10 (ref 5–15)
Anion gap: 7 (ref 5–15)
BUN: 10 mg/dL (ref 6–20)
BUN: 6 mg/dL (ref 6–20)
CO2: 24 mmol/L (ref 22–32)
CO2: 20 mmol/L — ABNORMAL LOW (ref 22–32)
Calcium: 9.1 mg/dL (ref 8.9–10.3)
Calcium: 7.7 mg/dL — ABNORMAL LOW (ref 8.9–10.3)
Chloride: 102 mmol/L (ref 98–111)
Chloride: 110 mmol/L (ref 98–111)
Creatinine, Ser: 0.72 mg/dL (ref 0.44–1.00)
Creatinine, Ser: 0.55 mg/dL (ref 0.44–1.00)
GFR, Estimated: >60 mL/min (ref >60)
GFR, Estimated: >60 mL/min (ref >60)
Glucose, Bld: 157 mg/dL — ABNORMAL HIGH (ref 70–99)
Glucose, Bld: 111 mg/dL — ABNORMAL HIGH (ref 70–99)
Potassium: 4 mmol/L (ref 3.5–5.1)
Potassium: 4.5 mmol/L (ref 3.5–5.1)
Sodium: 136 mmol/L (ref 135–145)
Sodium: 137 mmol/L (ref 135–145)

## 2020-08-06 LAB — GLUCOSE, CAPILLARY
Glucose-Capillary: 132 mg/dL — ABNORMAL HIGH (ref 70–99)
Glucose-Capillary: 133 mg/dL — ABNORMAL HIGH (ref 70–99)
Glucose-Capillary: 168 mg/dL — ABNORMAL HIGH (ref 70–99)
Glucose-Capillary: 123 mg/dL — ABNORMAL HIGH (ref 70–99)
Glucose-Capillary: 92 mg/dL (ref 70–99)
Glucose-Capillary: 130 mg/dL — ABNORMAL HIGH (ref 70–99)
Glucose-Capillary: 175 mg/dL — ABNORMAL HIGH (ref 70–99)
Glucose-Capillary: 158 mg/dL — ABNORMAL HIGH (ref 70–99)

## 2020-08-06 LAB — ECHO INTRAOPERATIVE TEE
AV Mean grad: 2 mmHg
AV Peak grad: 4.3 mmHg
Ao pk vel: 1.04 m/s
Height: 65 in
S' Lateral: 4.26 cm
Weight: 3001.6 oz

## 2020-08-06 LAB — HEMOGLOBIN AND HEMATOCRIT, BLOOD
HCT: 21.6 % — ABNORMAL LOW (ref 36.0–46.0)
Hemoglobin: 6.8 g/dL — CL (ref 12.0–15.0)

## 2020-08-06 LAB — PREPARE RBC (CROSSMATCH)

## 2020-08-06 LAB — PROTIME-INR
INR: 1.4 — ABNORMAL HIGH (ref 0.8–1.2)
Prothrombin Time: 16.8 seconds — ABNORMAL HIGH (ref 11.4–15.2)

## 2020-08-06 LAB — PLATELET COUNT: Platelets: 330 10*3/uL (ref 150–400)

## 2020-08-06 LAB — MAGNESIUM: Magnesium: 2.7 mg/dL — ABNORMAL HIGH (ref 1.7–2.4)

## 2020-08-06 LAB — APTT: aPTT: 29 seconds (ref 24–36)

## 2020-08-06 SURGERY — CORONARY ARTERY BYPASS GRAFTING (CABG)
Anesthesia: General | Site: Chest

## 2020-08-06 MED ORDER — MIDAZOLAM HCL 2 MG/2ML IJ SOLN: 2.0000 mg | INTRAMUSCULAR | Status: DC | PRN

## 2020-08-06 MED ORDER — PROTAMINE SULFATE 10 MG/ML IV SOLN
INTRAVENOUS | Status: DC | PRN
  Administered 2020-08-06: 300 mg via INTRAVENOUS

## 2020-08-06 MED ORDER — BUPIVACAINE HCL (PF) 0.5 % IJ SOLN
INTRAMUSCULAR | Status: AC
  Filled 2020-08-06: qty 30

## 2020-08-06 MED ORDER — 0.9 % SODIUM CHLORIDE (POUR BTL) OPTIME
TOPICAL | Status: DC | PRN
  Administered 2020-08-06: 5000 mL

## 2020-08-06 MED ORDER — ACETAMINOPHEN 650 MG RE SUPP
650.0000 mg | Freq: Once | RECTAL | Status: AC
  Administered 2020-08-06: 650 mg via RECTAL

## 2020-08-06 MED ORDER — ALBUMIN HUMAN 5 % IV SOLN
250.0000 mL | INTRAVENOUS | Status: AC | PRN
  Administered 2020-08-06 – 2020-08-07 (×4): 12.5 g via INTRAVENOUS
  Filled 2020-08-06 (×2): qty 250

## 2020-08-06 MED ORDER — SODIUM CHLORIDE 0.9 % IV SOLN
1.5000 g | Freq: Two times a day (BID) | INTRAVENOUS | Status: AC
  Administered 2020-08-06 – 2020-08-08 (×4): 1.5 g via INTRAVENOUS
  Filled 2020-08-06 (×4): qty 1.5

## 2020-08-06 MED ORDER — PROPOFOL 10 MG/ML IV BOLUS
INTRAVENOUS | Status: AC
  Filled 2020-08-06: qty 20

## 2020-08-06 MED ORDER — DEXMEDETOMIDINE HCL IN NACL 400 MCG/100ML IV SOLN
0.0000 ug/kg/h | INTRAVENOUS | Status: DC
  Administered 2020-08-06 (×2): 0.2 ug/kg/h via INTRAVENOUS
  Filled 2020-08-06: qty 100

## 2020-08-06 MED ORDER — SODIUM CHLORIDE 0.9 % IV SOLN: INTRAVENOUS | Status: DC

## 2020-08-06 MED ORDER — SODIUM CHLORIDE 0.9% FLUSH: 3.0000 mL | INTRAVENOUS | Status: DC | PRN

## 2020-08-06 MED ORDER — LACTATED RINGERS IV SOLN: INTRAVENOUS | Status: DC

## 2020-08-06 MED ORDER — ARTIFICIAL TEARS OPHTHALMIC OINT
TOPICAL_OINTMENT | OPHTHALMIC | Status: DC | PRN
  Administered 2020-08-06: 1 via OPHTHALMIC

## 2020-08-06 MED ORDER — ASPIRIN 81 MG PO CHEW: 324.0000 mg | CHEWABLE_TABLET | Freq: Every day | ORAL | Status: DC

## 2020-08-06 MED ORDER — LACTATED RINGERS IV SOLN: 500.0000 mL | Freq: Once | INTRAVENOUS | Status: DC | PRN

## 2020-08-06 MED ORDER — METOPROLOL TARTRATE 5 MG/5ML IV SOLN: 2.5000 mg | INTRAVENOUS | Status: DC | PRN

## 2020-08-06 MED ORDER — THROMBIN (RECOMBINANT) 5000 UNITS EX SOLR
CUTANEOUS | Status: AC
  Filled 2020-08-06: qty 5000

## 2020-08-06 MED ORDER — MILRINONE LACTATE IN DEXTROSE 20-5 MG/100ML-% IV SOLN
0.2000 ug/kg/min | INTRAVENOUS | Status: DC
  Administered 2020-08-06 – 2020-08-07 (×2): 0.375 ug/kg/min via INTRAVENOUS
  Administered 2020-08-07: 0.2 ug/kg/min via INTRAVENOUS
  Filled 2020-08-06 (×3): qty 100

## 2020-08-06 MED ORDER — PANTOPRAZOLE SODIUM 40 MG PO TBEC
40.0000 mg | DELAYED_RELEASE_TABLET | Freq: Every day | ORAL | Status: DC
  Administered 2020-08-08 – 2020-08-11 (×4): 40 mg via ORAL
  Filled 2020-08-06 (×4): qty 1

## 2020-08-06 MED ORDER — CHLORHEXIDINE GLUCONATE 0.12 % MT SOLN
15.0000 mL | OROMUCOSAL | Status: AC
  Administered 2020-08-06: 15 mL via OROMUCOSAL

## 2020-08-06 MED ORDER — VANCOMYCIN HCL IN DEXTROSE 1-5 GM/200ML-% IV SOLN
1000.0000 mg | Freq: Once | INTRAVENOUS | Status: AC
  Administered 2020-08-06: 1000 mg via INTRAVENOUS
  Filled 2020-08-06: qty 200

## 2020-08-06 MED ORDER — MILRINONE LACTATE IN DEXTROSE 20-5 MG/100ML-% IV SOLN
INTRAVENOUS | Status: DC | PRN
  Administered 2020-08-06: .375 ug/kg/min via INTRAVENOUS

## 2020-08-06 MED ORDER — LACTATED RINGERS IV SOLN: INTRAVENOUS | Status: DC | PRN

## 2020-08-06 MED ORDER — THROMBIN 5000 UNITS EX SOLR
INTRAVENOUS | Status: DC | PRN
  Administered 2020-08-06: 2 mL

## 2020-08-06 MED ORDER — MORPHINE SULFATE (PF) 2 MG/ML IV SOLN
1.0000 mg | INTRAVENOUS | Status: DC | PRN
  Administered 2020-08-06 (×2): 2 mg via INTRAVENOUS
  Administered 2020-08-06: 4 mg via INTRAVENOUS
  Administered 2020-08-06: 2 mg via INTRAVENOUS
  Administered 2020-08-07: 4 mg via INTRAVENOUS
  Filled 2020-08-06: qty 1
  Filled 2020-08-06: qty 2
  Filled 2020-08-06: qty 1
  Filled 2020-08-06: qty 2
  Filled 2020-08-06: qty 1

## 2020-08-06 MED ORDER — MIDAZOLAM HCL (PF) 10 MG/2ML IJ SOLN
INTRAMUSCULAR | Status: AC
  Filled 2020-08-06: qty 2

## 2020-08-06 MED ORDER — MAGNESIUM SULFATE 4 GM/100ML IV SOLN
4.0000 g | Freq: Once | INTRAVENOUS | Status: AC
  Administered 2020-08-06: 4 g via INTRAVENOUS
  Filled 2020-08-06: qty 100

## 2020-08-06 MED ORDER — BISACODYL 10 MG RE SUPP: 10.0000 mg | Freq: Every day | RECTAL | Status: DC

## 2020-08-06 MED ORDER — MIDAZOLAM HCL 5 MG/5ML IJ SOLN
INTRAMUSCULAR | Status: DC | PRN
  Administered 2020-08-06: 1 mg via INTRAVENOUS
  Administered 2020-08-06: 2 mg via INTRAVENOUS
  Administered 2020-08-06 (×2): 1 mg via INTRAVENOUS
  Administered 2020-08-06: 3 mg via INTRAVENOUS
  Administered 2020-08-06: 2 mg via INTRAVENOUS

## 2020-08-06 MED ORDER — METOCLOPRAMIDE HCL 5 MG/ML IJ SOLN
10.0000 mg | Freq: Four times a day (QID) | INTRAMUSCULAR | Status: AC
  Administered 2020-08-06 – 2020-08-07 (×3): 10 mg via INTRAVENOUS
  Filled 2020-08-06 (×3): qty 2

## 2020-08-06 MED ORDER — HEPARIN SODIUM (PORCINE) 1000 UNIT/ML IJ SOLN
INTRAMUSCULAR | Status: DC | PRN
  Administered 2020-08-06: 30000 [IU] via INTRAVENOUS

## 2020-08-06 MED ORDER — OXYCODONE HCL 5 MG PO TABS
5.0000 mg | ORAL_TABLET | ORAL | Status: DC | PRN
  Administered 2020-08-07 – 2020-08-08 (×4): 10 mg via ORAL
  Filled 2020-08-06 (×4): qty 2

## 2020-08-06 MED ORDER — PLASMA-LYTE 148 IV SOLN
INTRAVENOUS | Status: DC | PRN
  Administered 2020-08-06: 500 mL via INTRAVASCULAR

## 2020-08-06 MED ORDER — VANCOMYCIN HCL 1000 MG IV SOLR
INTRAVENOUS | Status: AC
  Filled 2020-08-06: qty 3000

## 2020-08-06 MED ORDER — NITROGLYCERIN IN D5W 200-5 MCG/ML-% IV SOLN
0.0000 ug/min | INTRAVENOUS | Status: DC
  Administered 2020-08-06: 5 ug/min via INTRAVENOUS

## 2020-08-06 MED ORDER — BISACODYL 5 MG PO TBEC
10.0000 mg | DELAYED_RELEASE_TABLET | Freq: Every day | ORAL | Status: DC
  Administered 2020-08-07 – 2020-08-11 (×5): 10 mg via ORAL
  Filled 2020-08-06 (×5): qty 2

## 2020-08-06 MED ORDER — PLATELET POOR PLASMA OPTIME
Status: DC | PRN
  Administered 2020-08-06: 10 mL

## 2020-08-06 MED ORDER — POTASSIUM CHLORIDE 10 MEQ/50ML IV SOLN
10.0000 meq | INTRAVENOUS | Status: AC
  Administered 2020-08-06 (×3): 10 meq via INTRAVENOUS
  Filled 2020-08-06: qty 50

## 2020-08-06 MED ORDER — VANCOMYCIN HCL 1000 MG IV SOLR
INTRAVENOUS | Status: DC | PRN
  Administered 2020-08-06 (×3): 1000 mg via TOPICAL

## 2020-08-06 MED ORDER — SODIUM CHLORIDE 0.9% FLUSH
3.0000 mL | Freq: Two times a day (BID) | INTRAVENOUS | Status: DC
  Administered 2020-08-07 (×2): 3 mL via INTRAVENOUS

## 2020-08-06 MED ORDER — ACETAMINOPHEN 160 MG/5ML PO SOLN: 1000.0000 mg | Freq: Four times a day (QID) | ORAL | Status: DC

## 2020-08-06 MED ORDER — CHLORHEXIDINE GLUCONATE CLOTH 2 % EX PADS
6.0000 | MEDICATED_PAD | Freq: Every day | CUTANEOUS | Status: DC
  Administered 2020-08-06 – 2020-08-08 (×3): 6 via TOPICAL

## 2020-08-06 MED ORDER — PLATELET RICH PLASMA OPTIME
Status: DC | PRN
  Administered 2020-08-06: 10 mL

## 2020-08-06 MED ORDER — DEXTROSE 50 % IV SOLN: 0.0000 mL | INTRAVENOUS | Status: DC | PRN

## 2020-08-06 MED ORDER — INSULIN REGULAR(HUMAN) IN NACL 100-0.9 UT/100ML-% IV SOLN
INTRAVENOUS | Status: DC
  Filled 2020-08-06: qty 100

## 2020-08-06 MED ORDER — ACETAMINOPHEN 160 MG/5ML PO SOLN: 650.0000 mg | Freq: Once | ORAL | Status: AC

## 2020-08-06 MED ORDER — FENTANYL CITRATE (PF) 250 MCG/5ML IJ SOLN
INTRAMUSCULAR | Status: DC | PRN
  Administered 2020-08-06: 100 ug via INTRAVENOUS
  Administered 2020-08-06: 150 ug via INTRAVENOUS
  Administered 2020-08-06 (×2): 50 ug via INTRAVENOUS
  Administered 2020-08-06: 500 ug via INTRAVENOUS
  Administered 2020-08-06: 50 ug via INTRAVENOUS
  Administered 2020-08-06: 100 ug via INTRAVENOUS
  Administered 2020-08-06: 250 ug via INTRAVENOUS

## 2020-08-06 MED ORDER — TRAMADOL HCL 50 MG PO TABS
50.0000 mg | ORAL_TABLET | ORAL | Status: DC | PRN
  Administered 2020-08-07 – 2020-08-10 (×5): 100 mg via ORAL
  Filled 2020-08-06 (×5): qty 2

## 2020-08-06 MED ORDER — METOPROLOL TARTRATE 25 MG/10 ML ORAL SUSPENSION: 12.5000 mg | Freq: Two times a day (BID) | ORAL | Status: DC

## 2020-08-06 MED ORDER — DOCUSATE SODIUM 100 MG PO CAPS
200.0000 mg | ORAL_CAPSULE | Freq: Every day | ORAL | Status: DC
  Administered 2020-08-07 – 2020-08-11 (×5): 200 mg via ORAL
  Filled 2020-08-06 (×5): qty 2

## 2020-08-06 MED ORDER — METOPROLOL TARTRATE 12.5 MG HALF TABLET
12.5000 mg | ORAL_TABLET | Freq: Two times a day (BID) | ORAL | Status: DC
  Administered 2020-08-07 (×2): 12.5 mg via ORAL
  Filled 2020-08-06 (×2): qty 1

## 2020-08-06 MED ORDER — ALBUMIN HUMAN 5 % IV SOLN: INTRAVENOUS | Status: DC | PRN

## 2020-08-06 MED ORDER — BUPIVACAINE LIPOSOME 1.3 % IJ SUSP
20.0000 mL | Freq: Once | INTRAMUSCULAR | Status: DC
  Filled 2020-08-06: qty 20

## 2020-08-06 MED ORDER — PHENYLEPHRINE HCL-NACL 20-0.9 MG/250ML-% IV SOLN: 0.0000 ug/min | INTRAVENOUS | Status: DC

## 2020-08-06 MED ORDER — STERILE WATER FOR INJECTION IV SOLN
INTRAVENOUS | Status: DC | PRN
  Administered 2020-08-06: 30 mL

## 2020-08-06 MED ORDER — HEMOSTATIC AGENTS (NO CHARGE) OPTIME
TOPICAL | Status: DC | PRN
  Administered 2020-08-06 (×3): 1 via TOPICAL

## 2020-08-06 MED ORDER — STERILE WATER FOR INJECTION IJ SOLN
INTRAMUSCULAR | Status: AC
  Filled 2020-08-06: qty 10

## 2020-08-06 MED ORDER — FENTANYL CITRATE (PF) 250 MCG/5ML IJ SOLN
INTRAMUSCULAR | Status: AC
  Filled 2020-08-06: qty 25

## 2020-08-06 MED ORDER — PROPOFOL 10 MG/ML IV BOLUS
INTRAVENOUS | Status: DC | PRN
  Administered 2020-08-06: 30 mg via INTRAVENOUS

## 2020-08-06 MED ORDER — ASPIRIN EC 325 MG PO TBEC
325.0000 mg | DELAYED_RELEASE_TABLET | Freq: Every day | ORAL | Status: DC
  Administered 2020-08-07: 325 mg via ORAL
  Filled 2020-08-06: qty 1

## 2020-08-06 MED ORDER — BUPIVACAINE HCL 0.5 % IJ SOLN
INTRAMUSCULAR | Status: DC | PRN
  Administered 2020-08-06: 30 mL

## 2020-08-06 MED ORDER — ROCURONIUM BROMIDE 10 MG/ML (PF) SYRINGE
PREFILLED_SYRINGE | INTRAVENOUS | Status: DC | PRN
  Administered 2020-08-06 (×4): 50 mg via INTRAVENOUS

## 2020-08-06 MED ORDER — SODIUM CHLORIDE 0.9 % IV SOLN: 250.0000 mL | INTRAVENOUS | Status: DC

## 2020-08-06 MED ORDER — ONDANSETRON HCL 4 MG/2ML IJ SOLN
4.0000 mg | Freq: Four times a day (QID) | INTRAMUSCULAR | Status: DC | PRN
  Administered 2020-08-06: 4 mg via INTRAVENOUS
  Filled 2020-08-06 (×2): qty 2

## 2020-08-06 MED ORDER — FAMOTIDINE IN NACL 20-0.9 MG/50ML-% IV SOLN
20.0000 mg | Freq: Two times a day (BID) | INTRAVENOUS | Status: DC
  Administered 2020-08-06: 20 mg via INTRAVENOUS
  Filled 2020-08-06: qty 50

## 2020-08-06 MED ORDER — ACETAMINOPHEN 500 MG PO TABS
1000.0000 mg | ORAL_TABLET | Freq: Four times a day (QID) | ORAL | Status: DC
  Administered 2020-08-07 – 2020-08-11 (×16): 1000 mg via ORAL
  Filled 2020-08-06 (×18): qty 2

## 2020-08-06 MED ORDER — SODIUM CHLORIDE 0.45 % IV SOLN
INTRAVENOUS | Status: DC | PRN
  Administered 2020-08-06: 10 mL/h via INTRAVENOUS

## 2020-08-06 SURGICAL SUPPLY — 90 items
ADAPTER CARDIO PERF ANTE/RETRO (ADAPTER) ×3 IMPLANT
ATRICLIP EXCLUSION VLAA SYSTEM (Miscellaneous) ×3 IMPLANT
BAG DECANTER FOR FLEXI CONT (MISCELLANEOUS) ×3 IMPLANT
BLADE STERNUM SYSTEM 6 (BLADE) ×3 IMPLANT
BNDG ELASTIC 4X5.8 VLCR STR LF (GAUZE/BANDAGES/DRESSINGS) ×2 IMPLANT
BNDG ELASTIC 6X5.8 VLCR STR LF (GAUZE/BANDAGES/DRESSINGS) ×2 IMPLANT
BNDG GAUZE ELAST 4 BULKY (GAUZE/BANDAGES/DRESSINGS) ×2 IMPLANT
CANISTER SUCT 3000ML PPV (MISCELLANEOUS) ×3 IMPLANT
CANISTER WOUNDNEG PRESSURE 500 (CANNISTER) ×1 IMPLANT
CANNULA NON VENT 20FR 12 (CANNULA) ×1 IMPLANT
CATH CPB KIT HENDRICKSON (MISCELLANEOUS) ×3 IMPLANT
CATH ROBINSON RED A/P 18FR (CATHETERS) ×6 IMPLANT
CLIP RETRACTION 3.0MM CORONARY (MISCELLANEOUS) ×3 IMPLANT
DERMABOND ADVANCED (GAUZE/BANDAGES/DRESSINGS) ×1
DERMABOND ADVANCED .7 DNX12 (GAUZE/BANDAGES/DRESSINGS) ×2 IMPLANT
DRAIN CHANNEL 28F RND 3/8 FF (WOUND CARE) ×9 IMPLANT
DRAPE CARDIOVASCULAR INCISE (DRAPES) ×1
DRAPE SLUSH/WARMER DISC (DRAPES) ×3 IMPLANT
DRAPE SRG 135X102X78XABS (DRAPES) ×2 IMPLANT
DRESSING PREVENA PLUS CUSTOM (GAUZE/BANDAGES/DRESSINGS) IMPLANT
DRSG AQUACEL AG ADV 3.5X14 (GAUZE/BANDAGES/DRESSINGS) ×3 IMPLANT
DRSG PREVENA PLUS CUSTOM (GAUZE/BANDAGES/DRESSINGS) ×3
ELECT CAUTERY BLADE 6.4 (BLADE) ×3 IMPLANT
ELECT REM PT RETURN 9FT ADLT (ELECTROSURGICAL) ×6
ELECTRODE REM PT RTRN 9FT ADLT (ELECTROSURGICAL) ×4 IMPLANT
FELT TEFLON 1X6 (MISCELLANEOUS) ×6 IMPLANT
GAUZE SPONGE 4X4 12PLY STRL (GAUZE/BANDAGES/DRESSINGS) ×5 IMPLANT
GLOVE BIO SURGEON STRL SZ 6 (GLOVE) ×2 IMPLANT
GLOVE BIO SURGEON STRL SZ 6.5 (GLOVE) ×2 IMPLANT
GLOVE BIOGEL PI IND STRL 6.5 (GLOVE) IMPLANT
GLOVE BIOGEL PI INDICATOR 6.5 (GLOVE) ×1
GLOVE NEODERM STRL 7.5 LF PF (GLOVE) ×6 IMPLANT
GLOVE SURG NEODERM 7.5  LF PF (GLOVE) ×3
GOWN STRL REUS W/ TWL LRG LVL3 (GOWN DISPOSABLE) ×8 IMPLANT
GOWN STRL REUS W/TWL LRG LVL3 (GOWN DISPOSABLE) ×6
INSERT FOGARTY XLG (MISCELLANEOUS) ×1 IMPLANT
INSERT SUTURE HOLDER (MISCELLANEOUS) ×3 IMPLANT
KIT APPLICATOR RATIO 11:1 (KITS) ×1 IMPLANT
KIT BASIN OR (CUSTOM PROCEDURE TRAY) ×3 IMPLANT
KIT SUCTION CATH 14FR (SUCTIONS) ×3 IMPLANT
KIT TURNOVER KIT B (KITS) ×3 IMPLANT
NDL 18GX1X1/2 (RX/OR ONLY) (NEEDLE) ×2 IMPLANT
NEEDLE 18GX1X1/2 (RX/OR ONLY) (NEEDLE) ×3 IMPLANT
NS IRRIG 1000ML POUR BTL (IV SOLUTION) ×15 IMPLANT
PACK E OPEN HEART (SUTURE) ×3 IMPLANT
PACK OPEN HEART (CUSTOM PROCEDURE TRAY) ×3 IMPLANT
PACK PLATELET PROCEDURE 60 (MISCELLANEOUS) ×1 IMPLANT
PACK SPY-PHI (KITS) ×1 IMPLANT
PAD ARMBOARD 7.5X6 YLW CONV (MISCELLANEOUS) ×6 IMPLANT
PAD ELECT DEFIB RADIOL ZOLL (MISCELLANEOUS) ×3 IMPLANT
PENCIL BUTTON HOLSTER BLD 10FT (ELECTRODE) ×3 IMPLANT
POSITIONER HEAD DONUT 9IN (MISCELLANEOUS) ×3 IMPLANT
POWDER SURGICEL 3.0 GRAM (HEMOSTASIS) ×3 IMPLANT
SEALANT SURG COSEAL 4ML (VASCULAR PRODUCTS) ×3 IMPLANT
SET CARDIOPLEGIA MPS 5001102 (MISCELLANEOUS) ×1 IMPLANT
SUPPORT HEART JANKE-BARRON (MISCELLANEOUS) ×3 IMPLANT
SUT BONE WAX W31G (SUTURE) ×3 IMPLANT
SUT MNCRL AB 3-0 PS2 18 (SUTURE) ×6 IMPLANT
SUT PDS AB 1 CTX 36 (SUTURE) ×6 IMPLANT
SUT PROLENE 3 0 SH DA (SUTURE) ×3 IMPLANT
SUT PROLENE 4 0 SH DA (SUTURE) ×3 IMPLANT
SUT PROLENE 5 0 C 1 36 (SUTURE) IMPLANT
SUT PROLENE 6 0 C 1 30 (SUTURE) ×9 IMPLANT
SUT PROLENE BLUE 7 0 (SUTURE) ×3 IMPLANT
SUT SILK  1 MH (SUTURE) ×1
SUT SILK 1 MH (SUTURE) IMPLANT
SUT SILK 2 0SH CR/8 30 (SUTURE) ×2 IMPLANT
SUT STEEL 6MS V (SUTURE) ×3 IMPLANT
SUT STEEL SZ 6 DBL 3X14 BALL (SUTURE) ×3 IMPLANT
SUT VIC AB 1 CTX 36 (SUTURE) ×1
SUT VIC AB 1 CTX36XBRD ANBCTR (SUTURE) IMPLANT
SUT VIC AB 2-0 CTX 27 (SUTURE) IMPLANT
SUT VIC AB 3-0 X1 27 (SUTURE) IMPLANT
SYR 10ML LL (SYRINGE) IMPLANT
SYR 20ML LL LF (SYRINGE) ×1 IMPLANT
SYR 30ML LL (SYRINGE) ×3 IMPLANT
SYR 3ML LL SCALE MARK (SYRINGE) ×2 IMPLANT
SYR BULB IRRIG 60ML STRL (SYRINGE) ×1 IMPLANT
SYSTEM EXCLUSION ATRICLIP VLAA (Miscellaneous) IMPLANT
SYSTEM SAHARA CHEST DRAIN ATS (WOUND CARE) ×3 IMPLANT
TAPE CLOTH SURG 4X10 WHT LF (GAUZE/BANDAGES/DRESSINGS) ×1 IMPLANT
TAPE PAPER 2X10 WHT MICROPORE (GAUZE/BANDAGES/DRESSINGS) ×1 IMPLANT
TIP DUAL SPRAY TOPICAL (TIP) ×2 IMPLANT
TOWEL GREEN STERILE (TOWEL DISPOSABLE) ×3 IMPLANT
TOWEL GREEN STERILE FF (TOWEL DISPOSABLE) ×3 IMPLANT
TRAY FOLEY SLVR 16FR TEMP STAT (SET/KITS/TRAYS/PACK) ×3 IMPLANT
TUBING LAP HI FLOW INSUFFLATIO (TUBING) ×2 IMPLANT
UNDERPAD 30X36 HEAVY ABSORB (UNDERPADS AND DIAPERS) ×3 IMPLANT
WATER STERILE IRR 1000ML POUR (IV SOLUTION) ×6 IMPLANT
WATER STERILE IRR 1000ML UROMA (IV SOLUTION) IMPLANT

## 2020-08-06 NOTE — Procedures (Signed)
Extubation Procedure Note  Patient Details:   Name: Stacey Richardson DOB: [birth year] MRN: [id number]   Airway Documentation:    Vent end date: 08/06/20 Vent end time: 1625   Evaluation  O2 sats: stable throughout Complications: No apparent complications Patient did tolerate procedure well. Bilateral Breath Sounds: Clear, Diminished   Yes,  Prior to extubation, pt did have a positive cuff leak. Pt's VC= 64mL & NIF= -40. Pt was extubated to [facility]. Pt tolerated procedure well with SVS. There was no stridor noted. Pt was able to state "my back hurts." RT will continue to monitor pt.  Jorje Guild 08/06/2020, 4:31 PM

## 2020-08-06 NOTE — Anesthesia Procedure Notes (Signed)
Arterial Line Insertion Start/End11/28/2021 7:30 AM, 08/06/2020 7:45 AM Performed by: Amadeo Garnet, CRNA  Patient location: Pre-op. Preanesthetic checklist: patient identified, IV checked, site marked, risks and benefits discussed, surgical consent, monitors and equipment checked, pre-op evaluation, timeout performed and anesthesia consent Lidocaine 1% used for infiltration Left, radial was placed Catheter size: 20 Fr Hand hygiene performed  and maximum sterile barriers used   Attempts: 1 Procedure performed without using ultrasound guided technique. Following insertion, dressing applied. Post procedure assessment: normal and unchanged

## 2020-08-06 NOTE — H&P (Signed)
History and Physical Interval Note:  08/06/2020 7:13 AM  Stacey Richardson  has presented today for surgery, with the diagnosis of left main CAD.  The various methods of treatment have been discussed with the patient and family. After consideration of risks, benefits and other options for treatment, the patient has consented to  Procedure(s): CORONARY ARTERY BYPASS GRAFTING (CABG) X2 (N/A) as a surgical intervention.  The patient's history has been reviewed, patient examined, no change in status, stable for surgery.  I have reviewed the patient's chart and labs.  Questions were answered to the patient's satisfaction.     Wonda Olds

## 2020-08-06 NOTE — Transfer of Care (Signed)
Immediate Anesthesia Transfer of Care Note  Patient: Stacey Richardson  Procedure(s) Performed: CORONARY ARTERY BYPASS GRAFTING (CABG) X2, USING BILATERAL INTERNAL MAMMARY ARTERIES (N/A Chest) TRANSESOPHAGEAL ECHOCARDIOGRAM (TEE) (N/A ) CLIPPING OF ATRIAL APPENDAGE USING ATRICURE CLIP SIZE 40MM (N/A Chest)  Patient Location: SICU  Anesthesia Type:General  Level of Consciousness: Patient remains intubated per anesthesia plan  Airway & Oxygen Therapy: Patient remains intubated per anesthesia plan  Post-op Assessment: Report given to RN and Post -op Vital signs reviewed and stable  Post vital signs: Reviewed and stable  Last Vitals:  Vitals Value Taken Time  BP 105/64 08/06/20 1316  Temp    Pulse 104 08/06/20 1316  Resp 25 08/06/20 1316  SpO2 100 % 08/06/20 1316    Last Pain:  Vitals:   08/06/20 0605  TempSrc: Oral  PainSc:       Patients Stated Pain Goal: 0 (20/94/70 9628)  Complications: No complications documented.

## 2020-08-06 NOTE — Op Note (Signed)
CARDIOTHORACIC SURGERY OPERATIVE NOTE  Date of Procedure: 08/06/2020  Preoperative Diagnosis: Left main coronary Artery Disease and reduced LV function  Postoperative Diagnosis: Same  Procedure:    Coronary Artery Bypass Grafting x 2  Left Internal Mammary Artery to Distal Left Anterior Descending Coronary Artery; pedicled right internal mammary artery graft to first obtuse Marginal Branch of Left Circumflex Coronary Artery Bilateral internal mammary artery harvesting Completion graft surveillance with indocyanine green fluorescence angiography Application of Prevena incisional management system  Surgeon: B.  [name], MD  Assistant: Leretha Pol, PA-C  Anesthesia: General  Operative Findings:  Reduced left ventricular systolic function  Good quality internal mammary artery conduits  Good quality target vessels for grafting    BRIEF CLINICAL NOTE AND INDICATIONS FOR SURGERY  54 year old lady was being worked up for a TIA.  This demonstrated reduced LV function.  Work-up showed left main coronary artery disease.  She has been transferred to [facility] for coronary bypass grafting and is taken to the operating room today.   DETAILS OF THE OPERATIVE PROCEDURE  Preparation:  The patient is brought to the operating room on the above mentioned date and central monitoring was established by the anesthesia team including placement of Swan-Ganz catheter and radial arterial line. The patient is placed in the supine position on the operating table.  Intravenous antibiotics are administered. General endotracheal anesthesia is induced uneventfully. A Foley catheter is placed.  Baseline transesophageal echocardiogram was performed.  Findings were notable for mildly reduced global LV function  The patient's chest, abdomen, both groins, and both lower extremities are prepared and draped in a sterile manner. A time out procedure is performed.   Surgical Approach and Conduit  Harvest:  A median sternotomy incision was performed and the left internal mammary artery is dissected from the chest wall and prepared for bypass grafting. The left internal mammary artery is notably good quality conduit.  Next, attention was turned to the right hemithorax of the right internal mammary artery and its pedicle mobilized in standard fashion.  Following systemic heparinization, the internal mammary artery conduits were transected distally noted to have excellent flow.  They were both treated with a solution of papaverine.   Extracorporeal Cardiopulmonary Bypass and Myocardial Protection:  The pericardium is opened. The ascending aorta is nondiseased in appearance. The ascending aorta and the right atrium are cannulated for cardiopulmonary bypass.  Adequate heparinization is verified.   *  The entire pre-bypass portion of the operation was notable for stable hemodynamics.  Cardiopulmonary bypass was begun and the surface of the heart is inspected. Distal target vessels are selected for coronary artery bypass grafting. A cardioplegia cannula is placed in the ascending aorta.   The patient is allowed to cool passively to 34C systemic temperature.  The aortic cross clamp is applied and cold blood cardioplegia is delivered initially in an antegrade fashion through the aortic root. Iced saline slush is applied for topical hypothermia.  The initial cardioplegic arrest is rapid with early diastolic arrest.  Repeat doses of cardioplegia are administered intermittently throughout the entire cross clamp portion of the operation through the aortic root in order to maintain completely flat electrocardiogram.   Coronary Artery Bypass Grafting:     The first obtuse marginal branch of the left circumflex coronary artery was grafted using the pedicled right internal mammary artery graft which was brought underneath the aorta via the transverse sinus.  The anastomosis was done in an end-to-side  fashion.  At the site of  distal anastomosis the target vessel was good quality and measured approximately 1.5 mm in diameter. Anastomotic patency and runoff was confirmed with indocyanine green fluorescence imaging (SPY).    The distal left anterior coronary artery was grafted with the left internal mammary artery in an end-to-side fashion.  At the site of distal anastomosis the target vessel was good quality and measured approximately 1.5 mm in diameter. Anastomotic patency and runoff was confirmed with indocyanine green fluorescence imaging (SPY).  A hotshot dose of cardioplegia was given down the aortic root; de-airing procedures were performed and the aortic cross-clamp was removed.    Procedure Completion:  All proximal and distal coronary anastomoses were inspected for hemostasis and appropriate graft orientation. Epicardial pacing wires are fixed to the right ventricular outflow tract and to the right atrial appendage. The patient is rewarmed to 37C temperature. The patient is weaned and disconnected from cardiopulmonary bypass.  The patient's rhythm at separation from bypass was sinus bradycardia.  The patient was weaned from cardiopulmonary bypass with mild inotropic support.   Followup transesophageal echocardiogram performed after separation from bypass revealed improved ventricular function. The aortic and venous cannula were removed uneventfully. Protamine was administered to reverse the anticoagulation. The mediastinum and pleural space were inspected for hemostasis and irrigated with saline solution. The mediastinum and both pleural spaces were drained using fluted chest tubes placed through separate stab incisions inferiorly.  The soft tissues anterior to the aorta were reapproximated loosely. The sternum is closed with double strength sternal wire. The soft tissues anterior to the sternum were closed in multiple layers and the skin is closed with a running subcuticular skin  closure.  The post-bypass portion of the operation was notable for stable rhythm and hemodynamics.    Disposition:  The patient tolerated the procedure well and is transported to the surgical intensive care in stable condition. There are no intraoperative complications. All sponge instrument and needle counts are verified correct at completion of the operation.    Jayme Cloud, MD 08/06/2020 4:38 PM

## 2020-08-06 NOTE — Brief Op Note (Signed)
08/02/2020 - 08/06/2020  11:39 AM  PATIENT:  Stacey Richardson  54 y.o. female  PRE-OPERATIVE DIAGNOSIS:  left main CAD  POST-OPERATIVE DIAGNOSIS:  left main CAD  PROCEDURE:  Procedure(s):  CORONARY ARTERY BYPASS GRAFTING x 2 -LIMA to LAD -RIMA to OM 1  TRANSESOPHAGEAL ECHOCARDIOGRAM (TEE) (N/A)  CLIPPING OF ATRIAL APPENDAGE USING ATRICURE CLIP SIZE 40MM (N/A)  SURGEON:  Surgeon(s) and Role:    * Wonda Olds, MD - Primary  PHYSICIAN ASSISTANT: Ellwood Handler PA-C  ANESTHESIA:   general  EBL:  Per anes record   BLOOD ADMINISTERED:1 unit PRBC and  CELLSAVER  DRAINS: Right and Left Pleural Chest tube, Mediastinal chest drains   LOCAL MEDICATIONS USED:  NONE  SPECIMEN:  No Specimen  DISPOSITION OF SPECIMEN:  N/A  COUNTS:  YES  TOURNIQUET:  * No tourniquets in log *  DICTATION: .Dragon Dictation  PLAN OF CARE: Admit to inpatient   PATIENT DISPOSITION:  ICU - intubated and hemodynamically stable.   Delay start of Pharmacological VTE agent (>24hrs) due to surgical blood loss or risk of bleeding: yes Swan [place] Z. [name], [place]

## 2020-08-06 NOTE — Anesthesia Procedure Notes (Signed)
Central Venous Catheter Insertion Performed by: Annye Asa, MD, anesthesiologist Start/End11/28/2021 7:15 AM, 08/06/2020 7:28 AM Patient location: Pre-op. Preanesthetic checklist: patient identified, IV checked, risks and benefits discussed, surgical consent, monitors and equipment checked, pre-op evaluation, timeout performed and anesthesia consent Position: supine Lidocaine 1% used for infiltration and patient sedated Hand hygiene performed , maximum sterile barriers used  and Seldinger technique used Catheter size: 8.5 Fr PA cath was placed.Sheath introducer Swan type:thermodilution Procedure performed using ultrasound guided technique. Ultrasound Notes:anatomy identified, needle tip was noted to be adjacent to the nerve/plexus identified, no ultrasound evidence of intravascular and/or intraneural injection and image(s) printed for medical record Attempts: 1 Following insertion, line sutured, dressing applied and Biopatch. Post procedure assessment: blood return through all ports, free fluid flow and no air  Post procedure complications: arrhythmia (transient PVCs). Patient tolerated the procedure well with no immediate complications. Additional procedure comments: PA catheter:  Routine monitors. Timeout, sterile prep, drape, FBP R neck.  supine position.  1% Lido local, finder and trocar RIJ 1st pass with US guidance.  Cordis placed over J wire. PA catheter in easily.  Sterile dressing applied.  Patient tolerated well, VSS.  Jenita Seashore, MD.

## 2020-08-06 NOTE — Anesthesia Procedure Notes (Signed)
Procedure Name: Intubation Date/Time: 08/06/2020 8:10 AM Performed by: Clearnce Sorrel, CRNA Pre-anesthesia Checklist: Patient identified, Emergency Drugs available, Suction available, Patient being monitored and Timeout performed Patient Re-evaluated:Patient Re-evaluated prior to induction Oxygen Delivery Method: Circle system utilized Preoxygenation: Pre-oxygenation with 100% oxygen Induction Type: IV induction Ventilation: Mask ventilation without difficulty and Oral airway inserted - appropriate to patient size Laryngoscope Size: Mac and 3 Grade View: Grade II Tube type: Oral Tube size: 8.0 mm Number of attempts: 1 Airway Equipment and Method: Stylet Placement Confirmation: ETT inserted through vocal cords under direct vision,  positive ETCO2 and breath sounds checked- equal and bilateral Secured at: 23 cm Tube secured with: Tape Dental Injury: Teeth and Oropharynx as per pre-operative assessment

## 2020-08-07 ENCOUNTER — Inpatient Hospital Stay (HOSPITAL_COMMUNITY): Payer: 59

## 2020-08-07 ENCOUNTER — Encounter (HOSPITAL_COMMUNITY): Payer: Self-pay | Admitting: Cardiothoracic Surgery

## 2020-08-07 LAB — CBC
HCT: 28.4 % — ABNORMAL LOW (ref 36.0–46.0)
HCT: 29.4 % — ABNORMAL LOW (ref 36.0–46.0)
Hemoglobin: 8.9 g/dL — ABNORMAL LOW (ref 12.0–15.0)
Hemoglobin: 9.2 g/dL — ABNORMAL LOW (ref 12.0–15.0)
MCH: 28 pg (ref 26.0–34.0)
MCH: 28 pg (ref 26.0–34.0)
MCHC: 31.3 g/dL (ref 30.0–36.0)
MCHC: 31.3 g/dL (ref 30.0–36.0)
MCV: 89.3 fL (ref 80.0–100.0)
MCV: 89.4 fL (ref 80.0–100.0)
Platelets: 238 10*3/uL (ref 150–400)
Platelets: 267 10*3/uL (ref 150–400)
RBC: 3.18 MIL/uL — ABNORMAL LOW (ref 3.87–5.11)
RBC: 3.29 MIL/uL — ABNORMAL LOW (ref 3.87–5.11)
RDW: 13.7 % (ref 11.5–15.5)
RDW: 14 % (ref 11.5–15.5)
WBC: 15.3 10*3/uL — ABNORMAL HIGH (ref 4.0–10.5)
WBC: 15.2 10*3/uL — ABNORMAL HIGH (ref 4.0–10.5)
nRBC: 0 % (ref 0.0–0.2)
nRBC: 0 % (ref 0.0–0.2)

## 2020-08-07 LAB — GLUCOSE, CAPILLARY
Glucose-Capillary: 105 mg/dL — ABNORMAL HIGH (ref 70–99)
Glucose-Capillary: 138 mg/dL — ABNORMAL HIGH (ref 70–99)
Glucose-Capillary: 139 mg/dL — ABNORMAL HIGH (ref 70–99)
Glucose-Capillary: 147 mg/dL — ABNORMAL HIGH (ref 70–99)
Glucose-Capillary: 149 mg/dL — ABNORMAL HIGH (ref 70–99)
Glucose-Capillary: 151 mg/dL — ABNORMAL HIGH (ref 70–99)
Glucose-Capillary: 151 mg/dL — ABNORMAL HIGH (ref 70–99)
Glucose-Capillary: 168 mg/dL — ABNORMAL HIGH (ref 70–99)
Glucose-Capillary: 176 mg/dL — ABNORMAL HIGH (ref 70–99)
Glucose-Capillary: 187 mg/dL — ABNORMAL HIGH (ref 70–99)
Glucose-Capillary: 190 mg/dL — ABNORMAL HIGH (ref 70–99)
Glucose-Capillary: 88 mg/dL (ref 70–99)
Glucose-Capillary: 94 mg/dL (ref 70–99)

## 2020-08-07 LAB — BASIC METABOLIC PANEL
Anion gap: 9 (ref 5–15)
Anion gap: 9 (ref 5–15)
BUN: 5 mg/dL — ABNORMAL LOW (ref 6–20)
BUN: 5 mg/dL — ABNORMAL LOW (ref 6–20)
CO2: 20 mmol/L — ABNORMAL LOW (ref 22–32)
CO2: 21 mmol/L — ABNORMAL LOW (ref 22–32)
Calcium: 7.3 mg/dL — ABNORMAL LOW (ref 8.9–10.3)
Calcium: 7.9 mg/dL — ABNORMAL LOW (ref 8.9–10.3)
Chloride: 106 mmol/L (ref 98–111)
Chloride: 104 mmol/L (ref 98–111)
Creatinine, Ser: 0.59 mg/dL (ref 0.44–1.00)
Creatinine, Ser: 0.68 mg/dL (ref 0.44–1.00)
GFR, Estimated: >60 mL/min (ref >60)
GFR, Estimated: >60 mL/min (ref >60)
Glucose, Bld: 144 mg/dL — ABNORMAL HIGH (ref 70–99)
Glucose, Bld: 197 mg/dL — ABNORMAL HIGH (ref 70–99)
Potassium: 4.1 mmol/L (ref 3.5–5.1)
Potassium: 4.2 mmol/L (ref 3.5–5.1)
Sodium: 135 mmol/L (ref 135–145)
Sodium: 134 mmol/L — ABNORMAL LOW (ref 135–145)

## 2020-08-07 LAB — COOXEMETRY PANEL
Carboxyhemoglobin: 1.4 % (ref 0.5–1.5)
Methemoglobin: 1.2 % (ref 0.0–1.5)
O2 Saturation: 74.6 %
Total hemoglobin: 8.3 g/dL — ABNORMAL LOW (ref 12.0–16.0)

## 2020-08-07 LAB — MAGNESIUM
Magnesium: 2.1 mg/dL (ref 1.7–2.4)
Magnesium: 2.2 mg/dL (ref 1.7–2.4)

## 2020-08-07 MED ORDER — ASPIRIN EC 81 MG PO TBEC
81.0000 mg | DELAYED_RELEASE_TABLET | Freq: Every day | ORAL | Status: DC
  Administered 2020-08-08 – 2020-08-11 (×4): 81 mg via ORAL
  Filled 2020-08-07 (×4): qty 1

## 2020-08-07 MED ORDER — COLCHICINE 0.3 MG HALF TABLET
0.3000 mg | ORAL_TABLET | Freq: Two times a day (BID) | ORAL | Status: DC
  Administered 2020-08-07 (×2): 0.3 mg via ORAL
  Filled 2020-08-07 (×5): qty 1

## 2020-08-07 MED ORDER — LEVALBUTEROL TARTRATE 45 MCG/ACT IN AERO
1.0000 | INHALATION_SPRAY | Freq: Four times a day (QID) | RESPIRATORY_TRACT | Status: DC
  Filled 2020-08-07: qty 15

## 2020-08-07 MED ORDER — KETOROLAC TROMETHAMINE 15 MG/ML IJ SOLN
7.5000 mg | Freq: Four times a day (QID) | INTRAMUSCULAR | Status: AC
  Administered 2020-08-07 – 2020-08-08 (×7): 7.5 mg via INTRAVENOUS
  Filled 2020-08-07 (×7): qty 1

## 2020-08-07 MED ORDER — DIAZEPAM 2 MG PO TABS
2.0000 mg | ORAL_TABLET | Freq: Three times a day (TID) | ORAL | Status: AC
  Administered 2020-08-07 – 2020-08-09 (×7): 2 mg via ORAL
  Filled 2020-08-07 (×7): qty 1

## 2020-08-07 MED ORDER — INSULIN ASPART 100 UNIT/ML ~~LOC~~ SOLN
0.0000 [IU] | SUBCUTANEOUS | Status: DC
  Administered 2020-08-07 (×4): 2 [IU] via SUBCUTANEOUS
  Administered 2020-08-08: 3 [IU] via SUBCUTANEOUS
  Administered 2020-08-08 (×2): 2 [IU] via SUBCUTANEOUS
  Administered 2020-08-08: 1 [IU] via SUBCUTANEOUS
  Administered 2020-08-08 (×2): 2 [IU] via SUBCUTANEOUS
  Administered 2020-08-09: 1 [IU] via SUBCUTANEOUS
  Administered 2020-08-09 (×2): 2 [IU] via SUBCUTANEOUS

## 2020-08-07 MED ORDER — ISOSORBIDE DINITRATE 10 MG PO TABS
10.0000 mg | ORAL_TABLET | Freq: Three times a day (TID) | ORAL | Status: DC
  Administered 2020-08-07 – 2020-08-11 (×12): 10 mg via ORAL
  Filled 2020-08-07 (×12): qty 1

## 2020-08-07 MED ORDER — CLOPIDOGREL BISULFATE 75 MG PO TABS
75.0000 mg | ORAL_TABLET | Freq: Every day | ORAL | Status: DC
  Administered 2020-08-08 – 2020-08-11 (×4): 75 mg via ORAL
  Filled 2020-08-07 (×4): qty 1

## 2020-08-07 MED ORDER — LEVALBUTEROL TARTRATE 45 MCG/ACT IN AERO: 1.0000 | INHALATION_SPRAY | Freq: Four times a day (QID) | RESPIRATORY_TRACT | Status: DC | PRN

## 2020-08-07 MED FILL — Thrombin (Recombinant) For Soln 5000 Unit: CUTANEOUS | Qty: 5000 | Status: AC

## 2020-08-07 NOTE — Progress Notes (Signed)
      [address]       [place], [address]             [phone]      POD # 1 CABG x 2  Up in chair  BP 126/70   Pulse (!) 111   Temp 98.8 F (37.1 C) (Oral)   Resp (!) 0   Ht 5\' 5"  (1.651 m)   Wt 89.4 kg   LMP 07/25/2020 Comment: hcg neg today  SpO2 94%   BMI 32.80 kg/m     Intake/Output Summary (Last 24 hours) at 08/07/2020 1748 Last data filed at 08/07/2020 1700 Gross per 24 hour  Intake 2160.47 ml  Output 1395 ml  Net 765.47 ml   PM labs pending  Doing well POD # 1  Neidra Girvan C. Roxan Hockey, MD [facility] [phone]

## 2020-08-07 NOTE — Addendum Note (Signed)
Addendum  created 08/07/20 1005 by Josephine Igo, CRNA   Order list changed

## 2020-08-07 NOTE — Anesthesia Postprocedure Evaluation (Signed)
Anesthesia Post Note  Patient: Stacey Richardson  Procedure(s) Performed: CORONARY ARTERY BYPASS GRAFTING (CABG) X2, USING BILATERAL INTERNAL MAMMARY ARTERIES (N/A Chest) TRANSESOPHAGEAL ECHOCARDIOGRAM (TEE) (N/A ) CLIPPING OF ATRIAL APPENDAGE USING ATRICURE CLIP SIZE 40MM (N/A Chest)     Patient location during evaluation: SICU Anesthesia Type: General Level of consciousness: patient cooperative and oriented (pt sleepy) Pain management: pain level controlled Vital Signs Assessment: post-procedure vital signs reviewed and stable Respiratory status: nonlabored ventilation, spontaneous breathing, respiratory function stable and patient connected to nasal cannula oxygen (extubated and breathing well) Cardiovascular status: blood pressure returned to baseline and stable (weaning milrinone) Postop Assessment: no apparent nausea or vomiting Anesthetic complications: no   No complications documented.  Last Vitals:  Vitals:   08/06/20 2145 08/06/20 2200  BP:    Pulse: 96 92  Resp: (!) 0 (!) 0  Temp: 37.6 C 37.6 C  SpO2: 100% 100%    Last Pain:  Vitals:   08/06/20 1910  TempSrc:   PainSc: 8                  Jamas Jaquay,E. Donis Kotowski

## 2020-08-07 NOTE — Progress Notes (Signed)
1 Day Post-Op Procedure(s) (LRB): CORONARY ARTERY BYPASS GRAFTING (CABG) X2, USING BILATERAL INTERNAL MAMMARY ARTERIES (N/A) TRANSESOPHAGEAL ECHOCARDIOGRAM (TEE) (N/A) CLIPPING OF ATRIAL APPENDAGE USING ATRICURE CLIP SIZE 40MM (N/A) Subjective: No complaints  Objective: Vital signs in last 24 hours: Temp:  [98.6 F (37 C)-99.9 F (37.7 C)] 98.8 F (37.1 C) (11/29 1557) Pulse Rate:  [36-115] 111 (11/29 1430) Cardiac Rhythm: Sinus tachycardia (11/29 1200) Resp:  [0-42] 0 (11/29 1430) BP: (101-132)/(58-82) 126/70 (11/29 1430) SpO2:  [94 %-100 %] 94 % (11/29 1430) Arterial Line BP: (72-154)/(43-95) 72/43 (11/29 0200) Weight:  [89.4 kg] 89.4 kg (11/29 0500)  Hemodynamic parameters for last 24 hours: PAP: (18-44)/(10-23) 28/12 CO:  [4.7 L/min-6.8 L/min] 6.7 L/min CI:  [2.4 L/min/m2-3.5 L/min/m2] 3.5 L/min/m2  Intake/Output from previous day: 11/28 0701 - 11/29 0700 In: 4820.6 [I.V.:2582.1; Blood:431; IV Piggyback:1807.6] Out: 3818 [Urine:2020; Blood:905; Chest Tube:490] Intake/Output this shift: Total I/O In: 574.4 [P.O.:200; I.V.:328.3; IV Piggyback:46.1] Out: 355 [Urine:165; Chest Tube:190]  General appearance: alert and cooperative Neurologic: intact Heart: regular rate and rhythm, S1, S2 normal, no murmur, click, rub or gallop Lungs: clear to auscultation bilaterally Abdomen: soft, non-tender; bowel sounds normal; no masses,  no organomegaly Extremities: extremities normal, atraumatic, no cyanosis or edema Wound: dressed  Lab Results: Recent Labs    08/06/20 1902 08/07/20 0448  WBC 18.4* 15.3*  HGB 9.8* 8.9*  HCT 30.7* 28.4*  PLT 284 238   BMET:  Recent Labs    08/06/20 1902 08/07/20 0448  NA 137 135  K 4.5 4.1  CL 110 106  CO2 20* 20*  GLUCOSE 111* 144*  BUN 6 5*  CREATININE 0.55 0.59  CALCIUM 7.7* 7.3*    PT/INR:  Recent Labs    08/06/20 1339  LABPROT 16.8*  INR 1.4*   ABG    Component Value Date/Time   PHART 7.321 (L) 08/06/2020 1723    HCO3 19.9 (L) 08/06/2020 1723   TCO2 21 (L) 08/06/2020 1723   ACIDBASEDEF 6.0 (H) 08/06/2020 1723   O2SAT 74.6 08/07/2020 0429   CBG (last 3)  Recent Labs    08/07/20 0813 08/07/20 1123 08/07/20 1558  GLUCAP 151* 168* 176*    Assessment/Plan: S/P Procedure(s) (LRB): CORONARY ARTERY BYPASS GRAFTING (CABG) X2, USING BILATERAL INTERNAL MAMMARY ARTERIES (N/A) TRANSESOPHAGEAL ECHOCARDIOGRAM (TEE) (N/A) CLIPPING OF ATRIAL APPENDAGE USING ATRICURE CLIP SIZE 40MM (N/A) Mobilize Diabetes control  Reduce milrinone isosorbide   LOS: 5 days    Stacey Richardson 08/07/2020

## 2020-08-07 NOTE — Plan of Care (Signed)
  Problem: Education: Goal: Understanding of CV disease, CV risk reduction, and recovery process will improve Outcome: Progressing Goal: Individualized Educational Video(s) Outcome: Progressing   Problem: Activity: Goal: Ability to return to baseline activity level will improve Outcome: Progressing   Problem: Cardiovascular: Goal: Ability to achieve and maintain adequate cardiovascular perfusion will improve Outcome: Progressing Goal: Vascular access site(s) Level 0-1 will be maintained Outcome: Progressing   Problem: Education: Goal: Will demonstrate proper wound care and an understanding of methods to prevent future damage Outcome: Progressing Goal: Knowledge of disease or condition will improve Outcome: Progressing Goal: Knowledge of the prescribed therapeutic regimen will improve Outcome: Progressing Goal: Individualized Educational Video(s) Outcome: Progressing   Problem: Activity: Goal: Risk for activity intolerance will decrease Outcome: Progressing   Problem: Cardiac: Goal: Will achieve and/or maintain hemodynamic stability Outcome: Progressing   Problem: Clinical Measurements: Goal: Postoperative complications will be avoided or minimized Outcome: Progressing   Problem: Respiratory: Goal: Respiratory status will improve Outcome: Progressing   Problem: Skin Integrity: Goal: Wound healing without signs and symptoms of infection Outcome: Progressing Goal: Risk for impaired skin integrity will decrease Outcome: Progressing   Problem: Urinary Elimination: Goal: Ability to achieve and maintain adequate renal perfusion and functioning will improve Outcome: Progressing

## 2020-08-08 ENCOUNTER — Inpatient Hospital Stay (HOSPITAL_COMMUNITY): Payer: 59

## 2020-08-08 ENCOUNTER — Encounter (HOSPITAL_COMMUNITY)
Admission: AD | Disposition: A | Discharge status: Home / Self Care | Payer: Self-pay | Source: Other Acute Inpatient Hospital | Attending: Cardiology

## 2020-08-08 LAB — BASIC METABOLIC PANEL
Anion gap: 6 (ref 5–15)
BUN: 5 mg/dL — ABNORMAL LOW (ref 6–20)
CO2: 22 mmol/L (ref 22–32)
Calcium: 7.7 mg/dL — ABNORMAL LOW (ref 8.9–10.3)
Chloride: 102 mmol/L (ref 98–111)
Creatinine, Ser: 0.69 mg/dL (ref 0.44–1.00)
GFR, Estimated: >60 mL/min (ref >60)
Glucose, Bld: 189 mg/dL — ABNORMAL HIGH (ref 70–99)
Potassium: 4.1 mmol/L (ref 3.5–5.1)
Sodium: 130 mmol/L — ABNORMAL LOW (ref 135–145)

## 2020-08-08 LAB — COOXEMETRY PANEL
Carboxyhemoglobin: 1.5 % (ref 0.5–1.5)
Methemoglobin: 1 % (ref 0.0–1.5)
O2 Saturation: 62.1 %
Total hemoglobin: 8.8 g/dL — ABNORMAL LOW (ref 12.0–16.0)

## 2020-08-08 LAB — CBC
HCT: 27.5 % — ABNORMAL LOW (ref 36.0–46.0)
Hemoglobin: 8.4 g/dL — ABNORMAL LOW (ref 12.0–15.0)
MCH: 27.6 pg (ref 26.0–34.0)
MCHC: 30.5 g/dL (ref 30.0–36.0)
MCV: 90.5 fL (ref 80.0–100.0)
Platelets: 241 10*3/uL (ref 150–400)
RBC: 3.04 MIL/uL — ABNORMAL LOW (ref 3.87–5.11)
RDW: 14.2 % (ref 11.5–15.5)
WBC: 13 10*3/uL — ABNORMAL HIGH (ref 4.0–10.5)
nRBC: 0 % (ref 0.0–0.2)

## 2020-08-08 LAB — GLUCOSE, CAPILLARY
Glucose-Capillary: 178 mg/dL — ABNORMAL HIGH (ref 70–99)
Glucose-Capillary: 168 mg/dL — ABNORMAL HIGH (ref 70–99)
Glucose-Capillary: 183 mg/dL — ABNORMAL HIGH (ref 70–99)
Glucose-Capillary: 215 mg/dL — ABNORMAL HIGH (ref 70–99)
Glucose-Capillary: 196 mg/dL — ABNORMAL HIGH (ref 70–99)
Glucose-Capillary: 150 mg/dL — ABNORMAL HIGH (ref 70–99)

## 2020-08-08 SURGERY — CORONARY ARTERY BYPASS GRAFTING (CABG)
Anesthesia: General | Site: Chest

## 2020-08-08 MED ORDER — SODIUM CHLORIDE 0.9% FLUSH: 10.0000 mL | INTRAVENOUS | Status: DC | PRN

## 2020-08-08 MED ORDER — LEVALBUTEROL HCL 0.63 MG/3ML IN NEBU
0.6300 mg | INHALATION_SOLUTION | Freq: Four times a day (QID) | RESPIRATORY_TRACT | Status: AC
  Administered 2020-08-08: 0.63 mg via RESPIRATORY_TRACT
  Filled 2020-08-08: qty 3

## 2020-08-08 MED ORDER — METOPROLOL TARTRATE 25 MG PO TABS
25.0000 mg | ORAL_TABLET | Freq: Two times a day (BID) | ORAL | Status: DC
  Administered 2020-08-08 – 2020-08-09 (×4): 25 mg via ORAL
  Filled 2020-08-08 (×4): qty 1

## 2020-08-08 MED ORDER — METOPROLOL TARTRATE 25 MG/10 ML ORAL SUSPENSION: 25.0000 mg | Freq: Two times a day (BID) | ORAL | Status: DC

## 2020-08-08 MED ORDER — LEVALBUTEROL HCL 0.63 MG/3ML IN NEBU: 0.6300 mg | INHALATION_SOLUTION | Freq: Four times a day (QID) | RESPIRATORY_TRACT | Status: DC

## 2020-08-08 MED ORDER — SODIUM CHLORIDE 0.9% FLUSH
10.0000 mL | Freq: Two times a day (BID) | INTRAVENOUS | Status: DC
  Administered 2020-08-08 – 2020-08-10 (×6): 10 mL

## 2020-08-08 MED ORDER — FUROSEMIDE 10 MG/ML IJ SOLN
40.0000 mg | Freq: Two times a day (BID) | INTRAMUSCULAR | Status: DC
  Administered 2020-08-08 – 2020-08-09 (×4): 40 mg via INTRAVENOUS
  Filled 2020-08-08 (×4): qty 4

## 2020-08-08 MED ORDER — LEVALBUTEROL TARTRATE 45 MCG/ACT IN AERO: 1.0000 | INHALATION_SPRAY | Freq: Four times a day (QID) | RESPIRATORY_TRACT | Status: DC

## 2020-08-08 MED ORDER — POTASSIUM CHLORIDE CRYS ER 20 MEQ PO TBCR
20.0000 meq | EXTENDED_RELEASE_TABLET | Freq: Two times a day (BID) | ORAL | Status: DC
  Administered 2020-08-08 – 2020-08-09 (×4): 20 meq via ORAL
  Filled 2020-08-08 (×4): qty 1

## 2020-08-08 MED ORDER — COLCHICINE 0.6 MG PO TABS
0.6000 mg | ORAL_TABLET | Freq: Two times a day (BID) | ORAL | Status: DC
  Administered 2020-08-08 – 2020-08-11 (×7): 0.6 mg via ORAL
  Filled 2020-08-08 (×7): qty 1

## 2020-08-08 MED ORDER — MAGNESIUM OXIDE 400 (241.3 MG) MG PO TABS
400.0000 mg | ORAL_TABLET | Freq: Two times a day (BID) | ORAL | Status: DC
  Administered 2020-08-08 – 2020-08-11 (×7): 400 mg via ORAL
  Filled 2020-08-08 (×7): qty 1

## 2020-08-08 MED FILL — Mannitol IV Soln 20%: INTRAVENOUS | Qty: 500 | Status: AC

## 2020-08-08 MED FILL — Potassium Chloride Inj 2 mEq/ML: INTRAVENOUS | Qty: 40 | Status: AC

## 2020-08-08 MED FILL — Calcium Chloride Inj 10%: INTRAVENOUS | Qty: 10 | Status: AC

## 2020-08-08 MED FILL — Sodium Bicarbonate IV Soln 8.4%: INTRAVENOUS | Qty: 50 | Status: AC

## 2020-08-08 MED FILL — Sodium Chloride IV Soln 0.9%: INTRAVENOUS | Qty: 3000 | Status: AC

## 2020-08-08 MED FILL — Magnesium Sulfate Inj 50%: INTRAMUSCULAR | Qty: 10 | Status: AC

## 2020-08-08 MED FILL — Electrolyte-R (PH 7.4) Solution: INTRAVENOUS | Qty: 4000 | Status: AC

## 2020-08-08 MED FILL — Thrombin For Soln 5000 Unit: CUTANEOUS | Qty: 5000 | Status: AC

## 2020-08-08 MED FILL — Heparin Sodium (Porcine) Inj 1000 Unit/ML: INTRAMUSCULAR | Qty: 10 | Status: AC

## 2020-08-08 MED FILL — Heparin Sodium (Porcine) Inj 1000 Unit/ML: INTRAMUSCULAR | Qty: 30 | Status: AC

## 2020-08-08 NOTE — Progress Notes (Signed)
Inpatient Diabetes Program Recommendations  AACE/ADA: New Consensus Statement on Inpatient Glycemic Control (2015)  Target Ranges:  Prepandial:   less than 140 mg/dL      Peak postprandial:   less than 180 mg/dL (1-2 hours)      Critically ill patients:  140 - 180 mg/dL   Results for Stacey Richardson, Stacey Richardson (MRN [id number]) as of 08/08/2020 07:01  Ref. Range 08/06/2020 23:41 08/07/2020 00:35 08/07/2020 02:20 08/07/2020 03:06 08/07/2020 04:31 08/07/2020 05:34 08/07/2020 07:10 08/07/2020 08:13 08/07/2020 11:23 08/07/2020 15:58 08/07/2020 20:10    Glucose-Capillary Latest Ref Range: 70 - 99 mg/dL 88  IV Insulin Drip 138 (H)  IV Insulin Drip 149 (H)  IV Insulin Drip 139 (H)  IV Insulin Drip 94  IV Insulin Drip 147 (H)  IV Insulin Drip  151 (H)  IV Insulin Drip 151 (H)  IV Insulin Drip Stopped 168 (H)  2 units NOVOLOG  176 (H)  2 units NOVOLOG  187 (H)  2 units NOVOLOG    Results for Stacey Richardson, Stacey Richardson (MRN [id number]) as of 08/08/2020 09:13  Ref. Range 08/07/2020 23:50 08/08/2020 04:11 08/08/2020 07:34  Glucose-Capillary Latest Ref Range: 70 - 99 mg/dL 190 (H)  2 units NOVOLOG 178 (H)  2 units NOVOLOG 168 (H)   Results for Stacey Richardson, Stacey Richardson (MRN [id number]) as of 08/08/2020 07:01  Ref. Range 06/12/2020 17:04 08/05/2020 14:35  Hemoglobin A1C Latest Ref Range: 4.8 - 5.6 % 7.0 (H) 6.2 (H)   Admit: CABG  History: DM, CVA, CHF  Home DM Meds: Metformin 1000 mg BID  Current Orders: Novolog Sensitive Correction Scale/ SSI (0-9 units) Q4 hours    CBGs well controlled on current Novolog SSi regimen  Will follow and assist during hospital stay    --Will follow patient during hospitalization--  Wyn Quaker RN, MSN, CDE Diabetes Coordinator Inpatient Glycemic Control Team Team Pager: [phone] (8a-5p)

## 2020-08-08 NOTE — Progress Notes (Signed)
Patient ID: Stacey Richardson, female   DOB: October 11, 1965, 54 y.o.   MRN: [id number] TCTS Evening Rounds:  Hemodynamically stable in sinus rhythm sats 97% RA Urine output good. Chest tube output low.  Has transfer orders to [facility].

## 2020-08-08 NOTE — Plan of Care (Signed)

## 2020-08-09 ENCOUNTER — Inpatient Hospital Stay (HOSPITAL_COMMUNITY): Payer: 59

## 2020-08-09 LAB — BPAM RBC
Blood Product Expiration Date: 202112232359
Blood Product Expiration Date: 202112232359
Blood Product Expiration Date: 202112232359
Blood Product Expiration Date: 202112252359
ISSUE DATE / TIME: 202111281052
ISSUE DATE / TIME: 202111281052
Unit Type and Rh: 7300
Unit Type and Rh: 7300
Unit Type and Rh: 7300
Unit Type and Rh: 7300

## 2020-08-09 LAB — TYPE AND SCREEN
ABO/RH(D): B POS
Antibody Screen: NEGATIVE
Unit division: 0
Unit division: 0
Unit division: 0
Unit division: 0

## 2020-08-09 LAB — CBC
HCT: 26.5 % — ABNORMAL LOW (ref 36.0–46.0)
Hemoglobin: 8.3 g/dL — ABNORMAL LOW (ref 12.0–15.0)
MCH: 27.8 pg (ref 26.0–34.0)
MCHC: 31.3 g/dL (ref 30.0–36.0)
MCV: 88.6 fL (ref 80.0–100.0)
Platelets: 272 10*3/uL (ref 150–400)
RBC: 2.99 MIL/uL — ABNORMAL LOW (ref 3.87–5.11)
RDW: 14.2 % (ref 11.5–15.5)
WBC: 10.8 10*3/uL — ABNORMAL HIGH (ref 4.0–10.5)
nRBC: 0.3 % — ABNORMAL HIGH (ref 0.0–0.2)

## 2020-08-09 LAB — BASIC METABOLIC PANEL
Anion gap: 8 (ref 5–15)
BUN: 6 mg/dL (ref 6–20)
CO2: 27 mmol/L (ref 22–32)
Calcium: 8.1 mg/dL — ABNORMAL LOW (ref 8.9–10.3)
Chloride: 102 mmol/L (ref 98–111)
Creatinine, Ser: 0.7 mg/dL (ref 0.44–1.00)
GFR, Estimated: >60 mL/min (ref >60)
Glucose, Bld: 153 mg/dL — ABNORMAL HIGH (ref 70–99)
Potassium: 4 mmol/L (ref 3.5–5.1)
Sodium: 137 mmol/L (ref 135–145)

## 2020-08-09 LAB — GLUCOSE, CAPILLARY
Glucose-Capillary: 122 mg/dL — ABNORMAL HIGH (ref 70–99)
Glucose-Capillary: 105 mg/dL — ABNORMAL HIGH (ref 70–99)
Glucose-Capillary: 189 mg/dL — ABNORMAL HIGH (ref 70–99)
Glucose-Capillary: 178 mg/dL — ABNORMAL HIGH (ref 70–99)
Glucose-Capillary: 248 mg/dL — ABNORMAL HIGH (ref 70–99)

## 2020-08-09 MED ORDER — LORAZEPAM 1 MG PO TABS
1.0000 mg | ORAL_TABLET | Freq: Once | ORAL | Status: AC
  Administered 2020-08-09: 1 mg via ORAL
  Filled 2020-08-09: qty 1

## 2020-08-09 MED ORDER — MAGNESIUM HYDROXIDE 400 MG/5ML PO SUSP
15.0000 mL | Freq: Every day | ORAL | Status: DC | PRN
  Administered 2020-08-09: 15 mL via ORAL
  Filled 2020-08-09: qty 30

## 2020-08-09 MED ORDER — POLYETHYLENE GLYCOL 3350 17 G PO PACK
17.0000 g | PACK | Freq: Every day | ORAL | Status: DC
  Administered 2020-08-09: 17 g via ORAL
  Filled 2020-08-09 (×2): qty 1

## 2020-08-09 MED ORDER — INSULIN ASPART 100 UNIT/ML ~~LOC~~ SOLN
0.0000 [IU] | Freq: Three times a day (TID) | SUBCUTANEOUS | Status: DC
  Administered 2020-08-10: 2 [IU] via SUBCUTANEOUS
  Administered 2020-08-10: 3 [IU] via SUBCUTANEOUS
  Administered 2020-08-10: 2 [IU] via SUBCUTANEOUS
  Administered 2020-08-11: 3 [IU] via SUBCUTANEOUS

## 2020-08-09 NOTE — Telephone Encounter (Signed)
Patient had CABG on 08/06/20. Ok to defer scheduling TCD until after patient has recovered for a bit.

## 2020-08-09 NOTE — Hospital Course (Signed)
History of Present Illness:  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  Her work-up has been extensive without any clear explanation.  At the time she was found to have mildly depressed LV function.  She underwent left heart catheterization 2 days ago demonstrating 80% left main lesion. It was felt coronary bypass grafting would be indicated. She is transferred to [facility] for surgical consideration.  Hospital Course:  The patient was evaluated by Dr. [name] who felt she would benefit from coronary bypass grafting procedure.  The risks and benefits of the procedure were explained to the patient and she was agreeable to proceed.  Neurology was consulted to ensure patient was cleared for surgery with stroke history.  They felt these were most likely caused by Atrial Fibrillation and felt the patient was safe to proceed with surgery.  She remained chest pain free prior to her procedure.  She was taken to the operating room on 08/06/2020.  She underwent CABG x 2 utilizing LIMA to LAD and RIMA to OM and Clipping of her LA Appendage.  She tolerated the procedure without difficulty and was taken to the SICU in stable condition.  She was extubated the evening of surgery.  During her stay in the SICU the patient was started on Isosorbide for arterial grafts.  She was weaned off Milrinone as hemodynamics allowed.  She was maintaining NSR and felt stable for transfer to the progressive care unit on 08/08/2020.

## 2020-08-09 NOTE — Progress Notes (Addendum)
      [address]       [place] [address]             [phone]      3 Days Post-Op Procedure(s) (LRB): CORONARY ARTERY BYPASS GRAFTING (CABG) X2, USING BILATERAL INTERNAL MAMMARY ARTERIES (N/A) TRANSESOPHAGEAL ECHOCARDIOGRAM (TEE) (N/A) CLIPPING OF ATRIAL APPENDAGE USING ATRICURE CLIP SIZE 40MM (N/A) Subjective: Feels okay this morning. Appetite is slowly coming back.   Objective: Vital signs in last 24 hours: Temp:  [97.8 F (36.6 C)-98.4 F (36.9 C)] 97.9 F (36.6 C) (12/01 0735) Pulse Rate:  [91-116] 95 (12/01 0421) Cardiac Rhythm: Normal sinus rhythm (12/01 0733) Resp:  [0-29] 20 (12/01 0421) BP: (94-135)/(58-87) 94/68 (12/01 0421) SpO2:  [92 %-100 %] 98 % (12/01 0421) Weight:  [88 kg] 88 kg (12/01 0600)     Intake/Output from previous day: 11/30 0701 - 12/01 0700 In: 600.4 [P.O.:360; I.V.:240.4] Out: 2605 [JOITG:5498; Chest Tube:160] Intake/Output this shift: No intake/output data recorded.  General appearance: alert, cooperative and no distress Heart: regular rate and rhythm, S1, S2 normal, no murmur, click, rub or gallop Lungs: clear to auscultation bilaterally Abdomen: soft, non-tender; bowel sounds normal; no masses,  no organomegaly Extremities: extremities normal, atraumatic, no cyanosis or edema Wound: clean and dry  Lab Results: Recent Labs    08/08/20 0526 08/09/20 0209  WBC 13.0* 10.8*  HGB 8.4* 8.3*  HCT 27.5* 26.5*  PLT 241 272   BMET:  Recent Labs    08/08/20 0526 08/09/20 0209  NA 130* 137  K 4.1 4.0  CL 102 102  CO2 22 27  GLUCOSE 189* 153*  BUN 5* 6  CREATININE 0.69 0.70  CALCIUM 7.7* 8.1*    PT/INR:  Recent Labs    08/06/20 1339  LABPROT 16.8*  INR 1.4*   ABG    Component Value Date/Time   PHART 7.321 (L) 08/06/2020 1723   HCO3 19.9 (L) 08/06/2020 1723   TCO2 21 (L) 08/06/2020 1723   ACIDBASEDEF 6.0 (H) 08/06/2020 1723   O2SAT 62.1 08/08/2020 0526   CBG (last 3)  Recent Labs     08/08/20 2315 08/09/20 0455 08/09/20 0740  GLUCAP 150* 122* 105*    Assessment/Plan: S/P Procedure(s) (LRB): CORONARY ARTERY BYPASS GRAFTING (CABG) X2, USING BILATERAL INTERNAL MAMMARY ARTERIES (N/A) TRANSESOPHAGEAL ECHOCARDIOGRAM (TEE) (N/A) CLIPPING OF ATRIAL APPENDAGE USING ATRICURE CLIP SIZE 40MM (N/A)  1. CV-NSR in the 90s, BP low normal. Continue ass, statin, and BB. Holding ACEI for hypotension. 2. Pulm-tolerating room air with good oxygen saturation. CXR is stable without large effusion or pneumo.  3. Renal-creatinine 0.70, electrolytes okay, continue diuretics weight continues to comedown. 4. H and H 8.3/26.5, expected acute blood loss anemia 5. Endo-blood glucose well controlled. 6. Continue Isordil for radial artery harvest.   Plan: Doing well POD 3. Prevena in place. She is ambulating in the halls and using her incentive spirometer. Continue diuretics for fluid overload. Pain is well controlled.     LOS: 7 days    Stacey Richardson 08/09/2020 Pt seen and examined; agree with documentation. Ready for discharge in next day or so. Stacey Faron Z. Stacey Richardson Seen, [place]

## 2020-08-09 NOTE — Plan of Care (Signed)

## 2020-08-09 NOTE — Discharge Instructions (Signed)

## 2020-08-09 NOTE — Progress Notes (Signed)
Pt CBG order was changed from q4 to ACHS. At beginning of shift RN noticed that the insulin orders were still for q4 coverage on the [facility]. RN paged pharmacy to adjust time. Orders were then placed to d/c insulin and provide no coverage for CBG. RN paged Cardiology MD on call about concern for no insulin coverage with patient CBG of 248. MD stated to continue to monitor CBG levels, no new orders given.

## 2020-08-09 NOTE — Progress Notes (Signed)
CARDIAC REHAB PHASE I   PRE:  Rate/Rhythm: 107 ST    BP: sitting 114/64    SaO2: 94 RA  MODE:  Ambulation: 410 ft   POST:  Rate/Rhythm: 120 ST    BP: sitting 119/77     SaO2: 97 RA  Pt moved to EOB and stood independently. Used RW, slow and steady. C/o chest soreness. To recliner, encouraged IS and more walking.  [id number]  [place], ACSM 08/09/2020 11:04 AM

## 2020-08-10 ENCOUNTER — Inpatient Hospital Stay (HOSPITAL_COMMUNITY): Payer: 59

## 2020-08-10 LAB — GLUCOSE, CAPILLARY
Glucose-Capillary: 193 mg/dL — ABNORMAL HIGH (ref 70–99)
Glucose-Capillary: 238 mg/dL — ABNORMAL HIGH (ref 70–99)
Glucose-Capillary: 171 mg/dL — ABNORMAL HIGH (ref 70–99)
Glucose-Capillary: 264 mg/dL — ABNORMAL HIGH (ref 70–99)

## 2020-08-10 LAB — CBC
HCT: 29.1 % — ABNORMAL LOW (ref 36.0–46.0)
Hemoglobin: 9.1 g/dL — ABNORMAL LOW (ref 12.0–15.0)
MCH: 27.5 pg (ref 26.0–34.0)
MCHC: 31.3 g/dL (ref 30.0–36.0)
MCV: 87.9 fL (ref 80.0–100.0)
Platelets: 357 10*3/uL (ref 150–400)
RBC: 3.31 MIL/uL — ABNORMAL LOW (ref 3.87–5.11)
RDW: 13.6 % (ref 11.5–15.5)
WBC: 8.9 10*3/uL (ref 4.0–10.5)
nRBC: 0.4 % — ABNORMAL HIGH (ref 0.0–0.2)

## 2020-08-10 MED ORDER — SODIUM CHLORIDE 0.9% FLUSH
3.0000 mL | Freq: Two times a day (BID) | INTRAVENOUS | Status: DC
  Administered 2020-08-10: 3 mL via INTRAVENOUS

## 2020-08-10 MED ORDER — METOPROLOL TARTRATE 25 MG PO TABS: 37.5000 mg | ORAL_TABLET | Freq: Two times a day (BID) | ORAL | Status: DC

## 2020-08-10 MED ORDER — METOPROLOL TARTRATE 25 MG PO TABS
37.5000 mg | ORAL_TABLET | Freq: Two times a day (BID) | ORAL | Status: DC
  Administered 2020-08-10 – 2020-08-11 (×3): 37.5 mg via ORAL
  Filled 2020-08-10 (×3): qty 1

## 2020-08-10 MED ORDER — FUROSEMIDE 40 MG PO TABS
40.0000 mg | ORAL_TABLET | Freq: Every day | ORAL | Status: DC
  Administered 2020-08-10 – 2020-08-11 (×2): 40 mg via ORAL
  Filled 2020-08-10 (×2): qty 1

## 2020-08-10 MED ORDER — LORAZEPAM 1 MG PO TABS
1.0000 mg | ORAL_TABLET | Freq: Two times a day (BID) | ORAL | Status: DC | PRN
  Administered 2020-08-10 (×2): 1 mg via ORAL
  Filled 2020-08-10 (×2): qty 1

## 2020-08-10 MED ORDER — LEVALBUTEROL HCL 0.63 MG/3ML IN NEBU: 0.6300 mg | INHALATION_SOLUTION | Freq: Four times a day (QID) | RESPIRATORY_TRACT | Status: DC | PRN

## 2020-08-10 MED ORDER — ~~LOC~~ CARDIAC SURGERY, PATIENT & FAMILY EDUCATION
Freq: Once | Status: AC
  Filled 2020-08-10: qty 1

## 2020-08-10 MED ORDER — METOPROLOL TARTRATE 25 MG/10 ML ORAL SUSPENSION: 37.5000 mg | Freq: Two times a day (BID) | ORAL | Status: DC

## 2020-08-10 MED ORDER — FERROUS SULFATE 325 (65 FE) MG PO TABS
325.0000 mg | ORAL_TABLET | Freq: Three times a day (TID) | ORAL | Status: DC
  Administered 2020-08-11 (×2): 325 mg via ORAL
  Filled 2020-08-10 (×2): qty 1

## 2020-08-10 MED ORDER — POTASSIUM CHLORIDE CRYS ER 20 MEQ PO TBCR
20.0000 meq | EXTENDED_RELEASE_TABLET | Freq: Every day | ORAL | Status: DC
  Administered 2020-08-10 – 2020-08-11 (×2): 20 meq via ORAL
  Filled 2020-08-10 (×2): qty 1

## 2020-08-10 MED ORDER — SODIUM CHLORIDE 0.9 % IV SOLN: 250.0000 mL | INTRAVENOUS | Status: DC | PRN

## 2020-08-10 MED ORDER — METFORMIN HCL ER 500 MG PO TB24
1000.0000 mg | ORAL_TABLET | Freq: Two times a day (BID) | ORAL | Status: DC
  Administered 2020-08-11 (×2): 1000 mg via ORAL
  Filled 2020-08-10 (×3): qty 2

## 2020-08-10 MED ORDER — METOPROLOL TARTRATE 25 MG/10 ML ORAL SUSPENSION: 25.0000 mg | Freq: Two times a day (BID) | ORAL | Status: DC

## 2020-08-10 MED ORDER — SODIUM CHLORIDE 0.9% FLUSH: 3.0000 mL | INTRAVENOUS | Status: DC | PRN

## 2020-08-10 NOTE — Progress Notes (Signed)
      [address]       [place] [address]             [phone]      4 Days Post-Op Procedure(s) (LRB): CORONARY ARTERY BYPASS GRAFTING (CABG) X2, USING BILATERAL INTERNAL MAMMARY ARTERIES (N/A) TRANSESOPHAGEAL ECHOCARDIOGRAM (TEE) (N/A) CLIPPING OF ATRIAL APPENDAGE USING ATRICURE CLIP SIZE 40MM (N/A) Subjective: Feels okay this morning. She is worried about her heart rate.   Objective: Vital signs in last 24 hours: Temp:  [97.8 F (36.6 C)-98.8 F (37.1 C)] 98.8 F (37.1 C) (12/02 0726) Pulse Rate:  [94-115] 115 (12/02 0726) Cardiac Rhythm: Sinus tachycardia (12/02 0728) Resp:  [8-21] 14 (12/02 0726) BP: (103-119)/(66-79) 114/68 (12/02 0425) SpO2:  [95 %-98 %] 98 % (12/02 0425) Weight:  [84.1 kg] 84.1 kg (12/02 0425)     Intake/Output from previous day: 12/01 0701 - 12/02 0700 In: 260 [P.O.:240; I.V.:20] Out: 1750 [Urine:1750] Intake/Output this shift: No intake/output data recorded.  General appearance: alert, cooperative and no distress Heart: sinus tachycardia Lungs: clear to auscultation bilaterally Abdomen: soft, non-tender; bowel sounds normal; no masses,  no organomegaly Extremities: extremities normal, atraumatic, no cyanosis or edema Wound: clean and dry  Lab Results: Recent Labs    08/08/20 0526 08/09/20 0209  WBC 13.0* 10.8*  HGB 8.4* 8.3*  HCT 27.5* 26.5*  PLT 241 272   BMET:  Recent Labs    08/08/20 0526 08/09/20 0209  NA 130* 137  K 4.1 4.0  CL 102 102  CO2 22 27  GLUCOSE 189* 153*  BUN 5* 6  CREATININE 0.69 0.70  CALCIUM 7.7* 8.1*    PT/INR: No results for input(s): LABPROT, INR in the last 72 hours. ABG    Component Value Date/Time   PHART 7.321 (L) 08/06/2020 1723   HCO3 19.9 (L) 08/06/2020 1723   TCO2 21 (L) 08/06/2020 1723   ACIDBASEDEF 6.0 (H) 08/06/2020 1723   O2SAT 62.1 08/08/2020 0526   CBG (last 3)  Recent Labs    08/09/20 1552 08/09/20 2121 08/10/20 0608  GLUCAP 178* 248* 193*     Assessment/Plan: S/P Procedure(s) (LRB): CORONARY ARTERY BYPASS GRAFTING (CABG) X2, USING BILATERAL INTERNAL MAMMARY ARTERIES (N/A) TRANSESOPHAGEAL ECHOCARDIOGRAM (TEE) (N/A) CLIPPING OF ATRIAL APPENDAGE USING ATRICURE CLIP SIZE 40MM (N/A)  1. CV-ST in the low 100s, BP low normal. Continue asa, statin, and increase BB. Holding ACEI for now 2. Pulm-tolerating room air with good oxygen saturation. CXR is stable without large effusion or pneumo.  3. Renal-creatinine 0.70, electrolytes okay, continue diuretics weight continues to comedown. 4. H and H 8.3/26.5, expected acute blood loss anemia 5. Endo-blood glucose well controlled. 6. Continue Isordil for radial artery harvest.  Plan: Increase BB for better HR control. If she tolerates increase, remove EPW. EKG this morning. Change to oral lasix today. Excellent diuresis yesterday and now around baseline weight.     LOS: 8 days    Stacey Richardson 08/10/2020

## 2020-08-10 NOTE — Progress Notes (Signed)
CARDIAC REHAB PHASE I   PRE:  Rate/Rhythm: 105 ST    BP: sitting 127/83    SaO2: 97 RA  MODE:  Ambulation: 540 ft   POST:  Rate/Rhythm: 138 ST    BP: sitting 118/76     SaO2: 96 RA  Pt feeling well. Able to walk independently. HR elevated causing some SOB. Rest x1. To recliner, practiced IS, 700 ml. Encouraged x2 more walks today, set up d/c video. [place], ACSM 08/10/2020 9:12 AM

## 2020-08-10 NOTE — Plan of Care (Signed)
  Problem: Education: Goal: Understanding of CV disease, CV risk reduction, and recovery process will improve Outcome: Progressing Goal: Individualized Educational Video(s) Outcome: Progressing   Problem: Activity: Goal: Ability to return to baseline activity level will improve Outcome: Progressing   Problem: Cardiovascular: Goal: Ability to achieve and maintain adequate cardiovascular perfusion will improve Outcome: Progressing Goal: Vascular access site(s) Level 0-1 will be maintained Outcome: Progressing   Problem: Education: Goal: Will demonstrate proper wound care and an understanding of methods to prevent future damage Outcome: Progressing Goal: Knowledge of disease or condition will improve Outcome: Progressing Goal: Knowledge of the prescribed therapeutic regimen will improve Outcome: Progressing Goal: Individualized Educational Video(s) Outcome: Progressing   Problem: Activity: Goal: Risk for activity intolerance will decrease Outcome: Progressing   Problem: Cardiac: Goal: Will achieve and/or maintain hemodynamic stability Outcome: Progressing   Problem: Clinical Measurements: Goal: Postoperative complications will be avoided or minimized Outcome: Progressing   Problem: Respiratory: Goal: Respiratory status will improve Outcome: Progressing   Problem: Skin Integrity: Goal: Wound healing without signs and symptoms of infection Outcome: Progressing Goal: Risk for impaired skin integrity will decrease Outcome: Progressing   Problem: Urinary Elimination: Goal: Ability to achieve and maintain adequate renal perfusion and functioning will improve Outcome: Progressing   Problem: Education: Goal: Knowledge of General Education information will improve Description: Including pain rating scale, medication(s)/side effects and non-pharmacologic comfort measures Outcome: Progressing   Problem: Health Behavior/Discharge Planning: Goal: Ability to manage  health-related needs will improve Outcome: Progressing   Problem: Clinical Measurements: Goal: Ability to maintain clinical measurements within normal limits will improve Outcome: Progressing Goal: Will remain free from infection Outcome: Progressing Goal: Diagnostic test results will improve Outcome: Progressing Goal: Respiratory complications will improve Outcome: Progressing Goal: Cardiovascular complication will be avoided Outcome: Progressing   Problem: Activity: Goal: Risk for activity intolerance will decrease Outcome: Progressing

## 2020-08-11 ENCOUNTER — Ambulatory Visit: Payer: 59 | Admitting: Internal Medicine

## 2020-08-11 LAB — BASIC METABOLIC PANEL
Anion gap: 11 (ref 5–15)
BUN: 9 mg/dL (ref 6–20)
CO2: 26 mmol/L (ref 22–32)
Calcium: 8.9 mg/dL (ref 8.9–10.3)
Chloride: 99 mmol/L (ref 98–111)
Creatinine, Ser: 0.77 mg/dL (ref 0.44–1.00)
GFR, Estimated: >60 mL/min (ref >60)
Glucose, Bld: 232 mg/dL — ABNORMAL HIGH (ref 70–99)
Potassium: 3.8 mmol/L (ref 3.5–5.1)
Sodium: 136 mmol/L (ref 135–145)

## 2020-08-11 LAB — GLUCOSE, CAPILLARY
Glucose-Capillary: 133 mg/dL — ABNORMAL HIGH (ref 70–99)
Glucose-Capillary: 204 mg/dL — ABNORMAL HIGH (ref 70–99)

## 2020-08-11 LAB — CBC
HCT: 27.3 % — ABNORMAL LOW (ref 36.0–46.0)
Hemoglobin: 9 g/dL — ABNORMAL LOW (ref 12.0–15.0)
MCH: 28.3 pg (ref 26.0–34.0)
MCHC: 33 g/dL (ref 30.0–36.0)
MCV: 85.8 fL (ref 80.0–100.0)
Platelets: 386 10*3/uL (ref 150–400)
RBC: 3.18 MIL/uL — ABNORMAL LOW (ref 3.87–5.11)
RDW: 13.6 % (ref 11.5–15.5)
WBC: 9.3 10*3/uL (ref 4.0–10.5)
nRBC: 0.3 % — ABNORMAL HIGH (ref 0.0–0.2)

## 2020-08-11 MED ORDER — METOPROLOL TARTRATE 37.5 MG PO TABS: 37.5000 mg | ORAL_TABLET | Freq: Two times a day (BID) | ORAL | 1 refills | Status: DC

## 2020-08-11 MED ORDER — POTASSIUM CHLORIDE CRYS ER 20 MEQ PO TBCR
20.0000 meq | EXTENDED_RELEASE_TABLET | Freq: Every day | ORAL | 0 refills | Status: DC
Start: 2020-08-11 — End: 2020-10-11

## 2020-08-11 MED ORDER — FUROSEMIDE 40 MG PO TABS
40.0000 mg | ORAL_TABLET | Freq: Every day | ORAL | 0 refills | Status: DC
Start: 2020-08-11 — End: 2020-10-11

## 2020-08-11 MED ORDER — OXYCODONE HCL 5 MG PO TABS: 5.0000 mg | ORAL_TABLET | Freq: Four times a day (QID) | ORAL | 0 refills | Status: DC | PRN

## 2020-08-11 MED ORDER — ISOSORBIDE DINITRATE 10 MG PO TABS
10.0000 mg | ORAL_TABLET | Freq: Three times a day (TID) | ORAL | 0 refills | Status: DC
Start: 2020-08-11 — End: 2020-10-11

## 2020-08-11 MED ORDER — COLCHICINE 0.6 MG PO TABS
0.6000 mg | ORAL_TABLET | Freq: Two times a day (BID) | ORAL | 1 refills | Status: DC
Start: 2020-08-11 — End: 2020-10-11

## 2020-08-11 MED ORDER — MAGNESIUM OXIDE 400 (241.3 MG) MG PO TABS
400.0000 mg | ORAL_TABLET | Freq: Two times a day (BID) | ORAL | 0 refills | Status: DC
Start: 2020-08-11 — End: 2020-10-11

## 2020-08-11 NOTE — Progress Notes (Addendum)
Progress Note  Patient Name: Stacey Richardson Date of Encounter: 08/11/2020  [facility] Cardiologist: [name], MD   Subjective   Has mild sternal pain.  Otherwise feels well.  Concerned about her HR.  Inpatient Medications    Scheduled Meds: . acetaminophen  1,000 mg Oral Q6H   Or  . acetaminophen (TYLENOL) oral liquid 160 mg/5 mL  1,000 mg Per Tube Q6H  . aspirin EC  81 mg Oral Daily  . atorvastatin  80 mg Oral Daily  . bisacodyl  10 mg Oral Daily   Or  . bisacodyl  10 mg Rectal Daily  . Chlorhexidine Gluconate Cloth  6 each Topical Daily  . clopidogrel  75 mg Oral Daily  . colchicine  0.6 mg Oral BID  . docusate sodium  200 mg Oral Daily  . ferrous sulfate  325 mg Oral TID WC  . furosemide  40 mg Oral Daily  . insulin aspart  0-9 Units Subcutaneous TID WC  . isosorbide dinitrate  10 mg Oral TID  . magnesium oxide  400 mg Oral BID  . metFORMIN  1,000 mg Oral BID  . metoprolol tartrate  37.5 mg Per Tube BID   Or  . metoprolol tartrate  37.5 mg Oral BID  . pantoprazole  40 mg Oral Daily  . polyethylene glycol  17 g Oral Daily  . potassium chloride  20 mEq Oral Daily  . sodium chloride flush  10-40 mL Intracatheter Q12H  . sodium chloride flush  3 mL Intravenous Q12H   Continuous Infusions: . sodium chloride    . insulin 5.5 Units/hr (08/06/20 1604)  . lactated ringers     PRN Meds: sodium chloride, dextrose, lactated ringers, levalbuterol, LORazepam, magnesium hydroxide, melatonin, metoprolol tartrate, naphazoline-glycerin, ondansetron (ZOFRAN) IV, oxyCODONE, sodium chloride flush, traMADol   Vital Signs    Vitals:   08/10/20 2301 08/11/20 0431 08/11/20 0532 08/11/20 0818  BP: 112/63 105/62  129/72  Pulse: (!) 118 100    Resp: 18 18  18   Temp: 98.1 F (36.7 C) 97.8 F (36.6 C)    TempSrc: Oral Oral    SpO2: 98% 98%    Weight:   84 kg   Height:        Intake/Output Summary (Last 24 hours) at 08/11/2020 0924 Last data filed at 08/10/2020  2000 Gross per 24 hour  Intake 123 ml  Output 0 ml  Net 123 ml   Last 3 Weights 08/11/2020 08/10/2020 08/09/2020  Weight (lbs) 185 lb 3 oz 185 lb 6.5 oz 194 lb 0.1 oz  Weight (kg) 84 kg 84.1 kg 88 kg      Telemetry    Sinus tach - Personally Reviewed  ECG    Physical Exam   GEN: No acute distress.   Neck: No JVD Cardiac: RRR, no murmurs, rubs, or gallops.  Respiratory: Clear to auscultation bilaterally. GI: Soft, nontender, non-distended  MS: No edema; No deformity. Sternal bandage in place Neuro:  Nonfocal  Psych: Normal affect   Labs    High Sensitivity Troponin:   Recent Labs  Lab 07/20/20 1608  TROPONINIHS 3      Chemistry Recent Labs  Lab 08/08/20 0526 08/09/20 0209 08/11/20 0027  NA 130* 137 136  K 4.1 4.0 3.8  CL 102 102 99  CO2 22 27 26   GLUCOSE 189* 153* 232*  BUN 5* 6 9  CREATININE 0.69 0.70 0.77  CALCIUM 7.7* 8.1* 8.9  GFRNONAA >60 >60 >60  ANIONGAP  6 8 11      Hematology Recent Labs  Lab 08/09/20 0209 08/10/20 2150 08/11/20 0027  WBC 10.8* 8.9 9.3  RBC 2.99* 3.31* 3.18*  HGB 8.3* 9.1* 9.0*  HCT 26.5* 29.1* 27.3*  MCV 88.6 87.9 85.8  MCH 27.8 27.5 28.3  MCHC 31.3 31.3 33.0  RDW 14.2 13.6 13.6  PLT 272 357 386    BNPNo results for input(s): BNP, PROBNP in the last 168 hours.   DDimer No results for input(s): DDIMER in the last 168 hours.   Radiology    DG Chest 2 View  Result Date: 08/10/2020 CLINICAL DATA:  Status post coronary bypass grafting EXAM: CHEST - 2 VIEW COMPARISON:  08/09/2020 FINDINGS: Postsurgical changes are again seen. Small bilateral pleural effusions are noted. Previously seen atelectasis in the left upper lobe has resolved. No pneumothorax is seen. No bony abnormality is noted. IMPRESSION: Small bilateral pleural effusions. Electronically Signed   By: Inez Catalina M.D.   On: 08/10/2020 22:47    Cardiac Studies     Patient Profile     54 y.o. female post CABG  Assessment & Plan    1) CAD- doing well  post CABG.  COntinue DAPT with aspirin and Plavix.  2) High dose statin ordered.  We spoke at length about whole food plant based diet.   3) Tachycardia: continue metoprolol.  Will need losartan added back in the future when BP increases,  for decreased LV function and HTN.  Will make f/u appt with Dr. Saunders Revel.   For questions or updates, please contact [place] Please consult [email or web address] for contact info under        Signed, Larae Grooms, MD  08/11/2020, 9:24 AM

## 2020-08-11 NOTE — Progress Notes (Addendum)
CARDIAC REHAB PHASE I   PRE:  Rate/Rhythm: 99 SR    BP: sitting 122/75    SaO2:   MODE:  Ambulation: 750 ft   POST:  Rate/Rhythm: 114 ST    BP: sitting 121/71     SaO2:   No c/o walking, feels well. HR more controlled. Ed completed with good reception. Eager to exercise and work on diet. Will refer to [place]. Her address is wrong in [place], [employer]. [id number]   [place], ACSM 08/11/2020 10:23 AM

## 2020-08-11 NOTE — Progress Notes (Signed)
      [address]       [place] [address]             [phone]      5 Days Post-Op Procedure(s) (LRB): CORONARY ARTERY BYPASS GRAFTING (CABG) X2, USING BILATERAL INTERNAL MAMMARY ARTERIES (N/A) TRANSESOPHAGEAL ECHOCARDIOGRAM (TEE) (N/A) CLIPPING OF ATRIAL APPENDAGE USING ATRICURE CLIP SIZE 40MM (N/A) Subjective: Feels good this morning. Ready to go home.   Objective: Vital signs in last 24 hours: Temp:  [97.7 F (36.5 C)-98.3 F (36.8 C)] 97.8 F (36.6 C) (12/03 0431) Pulse Rate:  [67-118] 100 (12/03 0431) Cardiac Rhythm: Normal sinus rhythm (12/03 0000) Resp:  [17-23] 18 (12/03 0431) BP: (102-152)/(62-86) 105/62 (12/03 0431) SpO2:  [98 %-99 %] 98 % (12/03 0431) Weight:  [84 kg] 84 kg (12/03 0532)     Intake/Output from previous day: 12/02 0701 - 12/03 0700 In: 243 [P.O.:240; I.V.:3] Out: 0  Intake/Output this shift: No intake/output data recorded.  General appearance: alert, cooperative and no distress Heart: sinus tachycardia Lungs: clear to auscultation bilaterally Abdomen: soft, non-tender; bowel sounds normal; no masses,  no organomegaly Extremities: extremities normal, atraumatic, no cyanosis or edema Wound: clean and dry  Lab Results: Recent Labs    08/10/20 2150 08/11/20 0027  WBC 8.9 9.3  HGB 9.1* 9.0*  HCT 29.1* 27.3*  PLT 357 386   BMET:  Recent Labs    08/09/20 0209 08/11/20 0027  NA 137 136  K 4.0 3.8  CL 102 99  CO2 27 26  GLUCOSE 153* 232*  BUN 6 9  CREATININE 0.70 0.77  CALCIUM 8.1* 8.9    PT/INR: No results for input(s): LABPROT, INR in the last 72 hours. ABG    Component Value Date/Time   PHART 7.321 (L) 08/06/2020 1723   HCO3 19.9 (L) 08/06/2020 1723   TCO2 21 (L) 08/06/2020 1723   ACIDBASEDEF 6.0 (H) 08/06/2020 1723   O2SAT 62.1 08/08/2020 0526   CBG (last 3)  Recent Labs    08/10/20 1622 08/10/20 2131 08/11/20 0606  GLUCAP 171* 264* 133*    Assessment/Plan: S/P Procedure(s)  (LRB): CORONARY ARTERY BYPASS GRAFTING (CABG) X2, USING BILATERAL INTERNAL MAMMARY ARTERIES (N/A) TRANSESOPHAGEAL ECHOCARDIOGRAM (TEE) (N/A) CLIPPING OF ATRIAL APPENDAGE USING ATRICURE CLIP SIZE 40MM (N/A)  1. CV-ST in the low 100s, BP low normal. Continue asa, statin, and increase BB. Holding ACEI for now 2. Pulm-tolerating room air with good oxygen saturation. CXR is stable without large effusion or pneumo.  3. Renal-creatinine 0.70, electrolytes okay, continue diuretics weight continues to comedown. 4. H and H 8.3/26.5, expected acute blood loss anemia 5. Endo-blood glucose well controlled. 6. Continue Isordil for radial artery harvest.  Plan: Home today. Instructions reviewed.      LOS: 9 days    Stacey Richardson 08/11/2020

## 2020-08-11 NOTE — Plan of Care (Signed)
  Problem: Education: Goal: Understanding of CV disease, CV risk reduction, and recovery process will improve Outcome: Progressing Goal: Individualized Educational Video(s) Outcome: Progressing   Problem: Activity: Goal: Ability to return to baseline activity level will improve Outcome: Progressing   Problem: Cardiovascular: Goal: Ability to achieve and maintain adequate cardiovascular perfusion will improve Outcome: Progressing Goal: Vascular access site(s) Level 0-1 will be maintained Outcome: Progressing   Problem: Education: Goal: Will demonstrate proper wound care and an understanding of methods to prevent future damage Outcome: Progressing Goal: Knowledge of disease or condition will improve Outcome: Progressing Goal: Knowledge of the prescribed therapeutic regimen will improve Outcome: Progressing Goal: Individualized Educational Video(s) Outcome: Progressing   Problem: Activity: Goal: Risk for activity intolerance will decrease Outcome: Progressing   Problem: Cardiac: Goal: Will achieve and/or maintain hemodynamic stability Outcome: Progressing   Problem: Clinical Measurements: Goal: Postoperative complications will be avoided or minimized Outcome: Progressing   Problem: Respiratory: Goal: Respiratory status will improve Outcome: Progressing   Problem: Skin Integrity: Goal: Wound healing without signs and symptoms of infection Outcome: Progressing Goal: Risk for impaired skin integrity will decrease Outcome: Progressing   Problem: Urinary Elimination: Goal: Ability to achieve and maintain adequate renal perfusion and functioning will improve Outcome: Progressing   Problem: Education: Goal: Knowledge of General Education information will improve Description: Including pain rating scale, medication(s)/side effects and non-pharmacologic comfort measures Outcome: Progressing   Problem: Health Behavior/Discharge Planning: Goal: Ability to manage  health-related needs will improve Outcome: Progressing   Problem: Clinical Measurements: Goal: Ability to maintain clinical measurements within normal limits will improve Outcome: Progressing Goal: Will remain free from infection Outcome: Progressing Goal: Diagnostic test results will improve Outcome: Progressing Goal: Respiratory complications will improve Outcome: Progressing Goal: Cardiovascular complication will be avoided Outcome: Progressing   Problem: Activity: Goal: Risk for activity intolerance will decrease Outcome: Progressing   Problem: Nutrition: Goal: Adequate nutrition will be maintained Outcome: Progressing   Problem: Coping: Goal: Level of anxiety will decrease Outcome: Progressing   Problem: Elimination: Goal: Will not experience complications related to bowel motility Outcome: Progressing Goal: Will not experience complications related to urinary retention Outcome: Progressing   Problem: Pain Managment: Goal: General experience of comfort will improve Outcome: Progressing   Problem: Safety: Goal: Ability to remain free from injury will improve Outcome: Progressing   Problem: Skin Integrity: Goal: Risk for impaired skin integrity will decrease Outcome: Progressing

## 2020-08-11 NOTE — Plan of Care (Signed)
  Problem: Education: Goal: Understanding of CV disease, CV risk reduction, and recovery process will improve Outcome: Adequate for Discharge Goal: Individualized Educational Video(s) Outcome: Adequate for Discharge   Problem: Cardiovascular: Goal: Ability to achieve and maintain adequate cardiovascular perfusion will improve Outcome: Adequate for Discharge Goal: Vascular access site(s) Level 0-1 will be maintained Outcome: Adequate for Discharge   Problem: Activity: Goal: Ability to return to baseline activity level will improve Outcome: Adequate for Discharge   Problem: Education: Goal: Will demonstrate proper wound care and an understanding of methods to prevent future damage Outcome: Adequate for Discharge Goal: Knowledge of disease or condition will improve Outcome: Adequate for Discharge Goal: Knowledge of the prescribed therapeutic regimen will improve Outcome: Adequate for Discharge Goal: Individualized Educational Video(s) Outcome: Adequate for Discharge   Problem: Activity: Goal: Risk for activity intolerance will decrease Outcome: Adequate for Discharge   Problem: Clinical Measurements: Goal: Postoperative complications will be avoided or minimized Outcome: Adequate for Discharge   Problem: Cardiac: Goal: Will achieve and/or maintain hemodynamic stability Outcome: Adequate for Discharge   Problem: Respiratory: Goal: Respiratory status will improve Outcome: Adequate for Discharge   Problem: Skin Integrity: Goal: Wound healing without signs and symptoms of infection Outcome: Adequate for Discharge Goal: Risk for impaired skin integrity will decrease Outcome: Adequate for Discharge   Problem: Urinary Elimination: Goal: Ability to achieve and maintain adequate renal perfusion and functioning will improve Outcome: Adequate for Discharge   Problem: Education: Goal: Knowledge of General Education information will improve Description: Including pain rating  scale, medication(s)/side effects and non-pharmacologic comfort measures Outcome: Adequate for Discharge   Problem: Health Behavior/Discharge Planning: Goal: Ability to manage health-related needs will improve Outcome: Adequate for Discharge   Problem: Clinical Measurements: Goal: Ability to maintain clinical measurements within normal limits will improve Outcome: Adequate for Discharge Goal: Will remain free from infection Outcome: Adequate for Discharge Goal: Diagnostic test results will improve Outcome: Adequate for Discharge Goal: Respiratory complications will improve Outcome: Adequate for Discharge Goal: Cardiovascular complication will be avoided Outcome: Adequate for Discharge   Problem: Activity: Goal: Risk for activity intolerance will decrease Outcome: Adequate for Discharge   Problem: Nutrition: Goal: Adequate nutrition will be maintained Outcome: Adequate for Discharge   Problem: Coping: Goal: Level of anxiety will decrease Outcome: Adequate for Discharge   Problem: Elimination: Goal: Will not experience complications related to bowel motility Outcome: Adequate for Discharge Goal: Will not experience complications related to urinary retention Outcome: Adequate for Discharge   Problem: Pain Managment: Goal: General experience of comfort will improve Outcome: Adequate for Discharge   Problem: Safety: Goal: Ability to remain free from injury will improve Outcome: Adequate for Discharge   Problem: Skin Integrity: Goal: Risk for impaired skin integrity will decrease Outcome: Adequate for Discharge

## 2020-08-11 NOTE — Discharge Summary (Signed)
Physician Discharge Summary  Patient ID: Stacey Richardson MRN: [id number] DOB/AGE: [birth year] 54 y.o.  Admit date: 08/02/2020 Discharge date: 08/11/2020  Admission Diagnoses: Patient Active Problem List   Diagnosis Date Noted  . Left main coronary artery disease 08/02/2020  . Chronic HFrEF (heart failure with reduced ejection fraction) ([place]) 07/26/2020  . Lightheadedness 07/26/2020  . HFrEF (heart failure with reduced ejection fraction) ([place]) 06/20/2020  . Cardiomyopathy ([place]) 06/20/2020  . Hyperlipidemia LDL goal <70 06/20/2020  . Hyperglycemia   . Cryptogenic stroke ([place])   . TIA (transient ischemic attack) 06/12/2020  . Multifocal pneumonia 01/22/2019  . Type 2 diabetes mellitus without complication ([place])   . Essential hypertension     Discharge Diagnoses:  Principal Problem:   Left main coronary artery disease Active Problems:   Type 2 diabetes mellitus without complication (HCC)   Essential hypertension   Cryptogenic stroke (HCC)   Cardiomyopathy ([place])   Hyperlipidemia LDL goal <70   Chronic HFrEF (heart failure with reduced ejection fraction) ([place])   Discharged Condition: good  History of Present Illness:  Stacey Richardson is a 54 yo lady who was in her usual state of health when she presented with cryptogenic strokes in October of this year.  Her work-up has been extensive without any clear explanation.  At the time she was found to have mildly depressed LV function.  She underwent left heart catheterization 2 days ago demonstrating 80% left main lesion. It was felt coronary bypass grafting would be indicated. She is [facility] for surgical consideration.  Hospital Course:  The patient was evaluated by Dr. [name] who felt she would benefit from coronary bypass grafting procedure.  The risks and benefits of the procedure were explained to the patient and she was agreeable to proceed.  Neurology was consulted to ensure patient was cleared for surgery with stroke  history.  They felt these were most likely caused by Atrial Fibrillation and felt the patient was safe to proceed with surgery.  She remained chest pain free prior to her procedure.  She was taken to the operating room on 08/06/2020.  She underwent CABG x 2 utilizing LIMA to LAD and RIMA to OM and Clipping of her LA Appendage.  She tolerated the procedure without difficulty and was taken to the SICU in stable condition.  She was extubated the evening of surgery.  During her stay in the SICU the patient was started on Isosorbide for arterial grafts.  She was weaned off Milrinone as hemodynamics allowed.  She was maintaining NSR and felt stable for transfer to the progressive care unit on 08/08/2020. Once on the floor her oxygen was weaned. She was ambulating without assistance and her incisions were healing well. She was cleared for discharge.     Consults: none.    Significant Diagnostic Studies:  CLINICAL DATA:  Status post coronary bypass grafting  EXAM: CHEST - 2 VIEW  COMPARISON:  08/09/2020  FINDINGS: Postsurgical changes are again seen. Small bilateral pleural effusions are noted. Previously seen atelectasis in the left upper lobe has resolved. No pneumothorax is seen. No bony abnormality is noted.  IMPRESSION: Small bilateral pleural effusions.   Electronically Signed   By: Inez Catalina M.D.   On: 08/10/2020 22:47  Treatments:   CARDIOTHORACIC SURGERY OPERATIVE NOTE  Date of Procedure:    08/06/2020  Preoperative Diagnosis:      Left main coronary Artery Disease and reduced LV function  Postoperative Diagnosis:    Same  Procedure:  Coronary Artery Bypass Grafting x 2             Left Internal Mammary Artery to Distal Left Anterior Descending Coronary Artery; pedicled right internal mammary artery graft to first obtuse Marginal Branch of Left Circumflex Coronary Artery Bilateral internal mammary artery harvesting Completion graft surveillance  with indocyanine green fluorescence angiography Application of Prevena incisional management system  Surgeon:        B.  [name], MD  Assistant:       Leretha Pol, PA-C  Anesthesia:    General  Operative Findings: ? Reduced left ventricular systolic function ? Good quality internal mammary artery conduits ? Good quality target vessels for grafting    BRIEF CLINICAL NOTE AND INDICATIONS FOR SURGERY  54 year old lady was being worked up for a TIA.  This demonstrated reduced LV function.  Work-up showed left main coronary artery disease.  She has been transferred to [facility] for coronary bypass grafting and is taken to the operating room today.    Discharge Exam: Blood pressure 105/62, pulse 100, temperature 97.8 F (36.6 C), temperature source Oral, resp. rate 18, height 5\' 5"  (1.651 m), weight 84 kg, last menstrual period 07/25/2020, SpO2 98 %.  General appearance: alert, cooperative and no distress Heart: sinus tachycardia Lungs: clear to auscultation bilaterally Abdomen: soft, non-tender; bowel sounds normal; no masses,  no organomegaly Extremities: extremities normal, atraumatic, no cyanosis or edema Wound: clean and dry  Disposition:  There are no questions and answers to display.         Allergies as of 08/11/2020      Reactions   Peanut Butter Flavor Swelling   Lisinopril Cough      Medication List    STOP taking these medications   carvedilol 3.125 MG tablet Commonly known as: COREG   losartan 50 MG tablet Commonly known as: COZAAR   Midol Complete 500-60-15 MG Tabs Generic drug: Acetaminophen-Caff-Pyrilamine     TAKE these medications   acetaminophen 500 MG tablet Commonly known as: TYLENOL Take 500 mg by mouth every 6 (six) hours as needed for moderate pain or headache.   aspirin EC 81 MG tablet Take 81 mg by mouth daily.   atorvastatin 80 MG tablet Commonly known as: LIPITOR Take 80 mg by mouth at bedtime.   clopidogrel  75 MG tablet Commonly known as: PLAVIX Take 1 tablet (75 mg total) by mouth daily.   colchicine 0.6 MG tablet Take 1 tablet (0.6 mg total) by mouth 2 (two) times daily.   ferrous sulfate 325 (65 FE) MG tablet Take 325 mg by mouth 3 (three) times daily with meals.   furosemide 40 MG tablet Commonly known as: LASIX Take 1 tablet (40 mg total) by mouth daily.   isosorbide dinitrate 10 MG tablet Commonly known as: ISORDIL Take 1 tablet (10 mg total) by mouth 3 (three) times daily.   magnesium oxide 400 (241.3 Mg) MG tablet Commonly known as: MAG-OX Take 1 tablet (400 mg total) by mouth 2 (two) times daily.   metFORMIN 500 MG 24 hr tablet Commonly known as: GLUCOPHAGE-XR Take 1,000 mg by mouth 2 (two) times daily.   Metoprolol Tartrate 37.5 MG Tabs Take 37.5 mg by mouth 2 (two) times daily.   oxyCODONE 5 MG immediate release tablet Commonly known as: Oxy IR/ROXICODONE Take 1 tablet (5 mg total) by mouth every 6 (six) hours as needed for severe pain.   potassium chloride SA 20 MEQ tablet Commonly known as: KLOR-CON Take  1 tablet (20 mEq total) by mouth daily.   tetrahydrozoline 0.05 % ophthalmic solution Place 1-2 drops into both eyes 3 (three) times daily as needed (irritated eyes.).       Follow-up Information    Verta Ellen., NP Follow up.   Specialty: Cardiology Why: Katina Dung, PA-C 12/9 @9am  [facility]) Contact information: [place] [address] [phone]        Wonda Olds, MD Follow up.   Specialty: Cardiothoracic Surgery Why: Your routine follow-up appointment is on  12/13 @2 :00pm. Please arrive at 1:30pm for a chest xray located at [facility] which is on the first floor of our building Contact information: [address] [place] [address] [phone]        [name], [name], MD. Call in 1 day(s).   Specialty: Family Medicine Contact information: [address]  [place] [address] [phone]        [name], MD .   Specialty: Cardiology Contact information: [place]  [address] [phone]              The patient has been discharged on:   1.Beta Blocker:  Yes [ yes  ]                              No   [   ]                              If No, reason:  2.Ace Inhibitor/ARB: Yes [   ]                                     No  [  no  ]                                     If No, reason: normal blood pressure, allergy  3.Statin:   Yes [ yes  ]                  No  [   ]                  If No, reason:  4.Ecasa:  Yes  [ yes  ]                  No   [   ]                  If No, reason:   Signed: Elgie Collard 08/11/2020, 8:15 AM

## 2020-08-15 ENCOUNTER — Other Ambulatory Visit: Payer: Self-pay | Admitting: Physician Assistant

## 2020-08-16 ENCOUNTER — Telehealth: Payer: Self-pay | Admitting: Family Medicine

## 2020-08-16 NOTE — Telephone Encounter (Signed)
Pt called stating that she's having a cough and has some mucus come up, and has some slight pain.   It's concerning to her and would like to speak w/ someone about it to see if it's normal since she had her procedure.   Please call [phone]

## 2020-08-16 NOTE — Progress Notes (Signed)
Cardiology Office Note  Date: 08/16/2020   ID: ASHLEYANNE HEMMINGWAY, DOB 01-28-1966, MRN [id number]  PCP:  Theotis Burrow, MD  Cardiologist:  [name], MD Electrophysiologist:  None   Chief Complaint: Status post CABG x 2. Cough.   History of Present Illness: GRISELA MESCH is a 54 y.o. female with a history of CAD, TIA/CVA, lightheadedness, DM2, HFrEF, HLD, HTN, S/P CABG X 2 (LIMA-dLAD, Pedicled RIMA-1st OM Lcx)   Patient with history of cryptogenic strokes in October 2021.  Had extensive work-up with no clear explanation as to etiology.   Echocardiogram was ordered demonstrating mildly depressed LV function.  She underwent a left heart cath demonstrating 80% left main lesion.  Cardiothoracic surgery Dr. [name] was consulted and recommended coronary artery bypass graft.  On 08/06/2020 patient underwent CABG with LIMA to distal LAD and pedicled RIMA to first obtuse marginal/LCx, and clipping of LAA.  Patient called nursing staff on 08/16/2020 complaining of coughing spells with clear mucus production since coronary artery bypass surgery and right-sided chest pain lasting a few minutes at a time rating pain at 5 out of 10 happening only a few times since the procedure.  Dr. Saunders Revel was contacted and he recommended trying an over-the-counter cough suppressant such as Robitussin or Mucinex.  She is here today for postop follow-up for CABG.  States she still coughing some productive of clear phlegm.  She states she picked up some over-the-counter cough medication and it seems to be helping some.  Denies any current chest pain.  Median sternotomy state and chest tube insertion sites are clean and dry.  She is tachycardic on arrival with sinus tachycardia rate of 121 bpm.  She is very anxious and teary when discussing what just happened to her.  She denies any dyspnea on exertion or anginal symptoms, orthostatic symptoms, PND, orthopnea, bleeding, claudication-like symptoms, DVT or PE-like  symptoms.  He states she is having an upcoming chest x-ray prior to seeing Dr. Julien Girt [facility] for postop follow-up status post her CABG.  Past Medical History:  Diagnosis Date  . CAD (coronary artery disease)   . CVA (cerebral vascular accident) ([place])   . Diabetes mellitus   . HFrEF (heart failure with reduced ejection fraction) ([place])   . HLD (hyperlipidemia)   . Hypertension     Past Surgical History:  Procedure Laterality Date  . CLIPPING OF ATRIAL APPENDAGE N/A 08/06/2020   Procedure: CLIPPING OF ATRIAL APPENDAGE USING ATRICURE CLIP SIZE 40MM;  Surgeon: Wonda Olds, MD;  Location: [facility];  Service: Open Heart Surgery;  Laterality: N/A;  . CORONARY ARTERY BYPASS GRAFT N/A 08/06/2020   Procedure: CORONARY ARTERY BYPASS GRAFTING (CABG) X2, USING BILATERAL INTERNAL MAMMARY ARTERIES;  Surgeon: Wonda Olds, MD;  Location: [facility];  Service: Open Heart Surgery;  Laterality: N/A;  . KNEE SURGERY    . RIGHT/LEFT HEART CATH AND CORONARY ANGIOGRAPHY Bilateral 08/02/2020   Procedure: RIGHT/LEFT HEART CATH AND CORONARY ANGIOGRAPHY;  Surgeon: [name], MD;  Location: [facility];  Service: Cardiovascular;  Laterality: Bilateral;  . TEE WITHOUT CARDIOVERSION N/A 07/18/2020   Procedure: TRANSESOPHAGEAL ECHOCARDIOGRAM (TEE);  Surgeon: [name], MD;  Location: [facility];  Service: Cardiovascular;  Laterality: N/A;  . TEE WITHOUT CARDIOVERSION N/A 08/06/2020   Procedure: TRANSESOPHAGEAL ECHOCARDIOGRAM (TEE);  Surgeon: Wonda Olds, MD;  Location: [facility];  Service: Open Heart Surgery;  Laterality: N/A;    Current Outpatient Medications  Medication Sig Dispense Refill  . acetaminophen (TYLENOL)  500 MG tablet Take 500 mg by mouth every 6 (six) hours as needed for moderate pain or headache.    Marland Kitchen aspirin EC 81 MG tablet Take 81 mg by mouth daily.     Marland Kitchen atorvastatin (LIPITOR) 80 MG tablet Take 80 mg by mouth at bedtime.    . clopidogrel (PLAVIX) 75 MG tablet Take 1 tablet  (75 mg total) by mouth daily. 90 tablet 0  . colchicine 0.6 MG tablet Take 1 tablet (0.6 mg total) by mouth 2 (two) times daily. 30 tablet 1  . ferrous sulfate 325 (65 FE) MG tablet Take 325 mg by mouth 3 (three) times daily with meals.     . furosemide (LASIX) 40 MG tablet Take 1 tablet (40 mg total) by mouth daily. 7 tablet 0  . isosorbide dinitrate (ISORDIL) 10 MG tablet Take 1 tablet (10 mg total) by mouth 3 (three) times daily. 90 tablet 0  . magnesium oxide (MAG-OX) 400 (241.3 Mg) MG tablet Take 1 tablet (400 mg total) by mouth 2 (two) times daily. 30 tablet 0  . metFORMIN (GLUCOPHAGE-XR) 500 MG 24 hr tablet Take 1,000 mg by mouth 2 (two) times daily.    . Metoprolol Tartrate 37.5 MG TABS Take 37.5 mg by mouth 2 (two) times daily. 60 tablet 1  . oxyCODONE (OXY IR/ROXICODONE) 5 MG immediate release tablet Take 1 tablet (5 mg total) by mouth every 6 (six) hours as needed for severe pain. 30 tablet 0  . potassium chloride SA (KLOR-CON) 20 MEQ tablet Take 1 tablet (20 mEq total) by mouth daily. 15 tablet 0  . tetrahydrozoline 0.05 % ophthalmic solution Place 1-2 drops into both eyes 3 (three) times daily as needed (irritated eyes.).     No current facility-administered medications for this visit.   Allergies:  Peanut butter flavor and Lisinopril   Social History: The patient  reports that she has quit smoking. She has never used smokeless tobacco. She reports current alcohol use. She reports that she does not use drugs.   Family History: The patient's family history includes Heart disease in her mother.   ROS:  Please see the history of present illness. Otherwise, complete review of systems is positive for none.  All other systems are reviewed and negative.   Physical Exam: VS:  LMP 07/25/2020 Comment: hcg neg today, BMI There is no height or weight on file to calculate BMI.  Wt Readings from Last 3 Encounters:  08/11/20 185 lb 3 oz (84 kg)  08/02/20 195 lb (88.5 kg)  07/26/20 192 lb  (87.1 kg)    General: Patient appears comfortable at rest. Neck: Supple, no elevated JVP or carotid bruits, no thyromegaly. Lungs: Clear to auscultation, nonlabored breathing at rest. Cardiac: Tachycardic rate and rhythm, no S3 or significant systolic murmur, no pericardial rub. Extremities: No pitting edema, distal pulses 2+. Skin: Warm and dry.  Median sternotomy scar clean and dry with no signs of redness or infection.  Chest tube insertion sites clean and dry with sutures in place. Musculoskeletal: No kyphosis. Neuropsychiatric: Alert and oriented x3, affect grossly appropriate.  ECG:  An ECG dated 08/17/2020 was personally reviewed today and demonstrated:  Sinus tachycardia rate of 121 possible left atrial enlargement, cannot rule out anterior infarct age undetermined.  Recent Labwork: 06/12/2020: TSH 1.251 07/20/2020: B Natriuretic Peptide 26.5 08/03/2020: ALT 13; AST 14 08/07/2020: Magnesium 2.2 08/11/2020: BUN 9; Creatinine, Ser 0.77; Hemoglobin 9.0; Platelets 386; Potassium 3.8; Sodium 136     Component Value Date/Time  CHOL 104 08/03/2020 0042   TRIG 60 08/03/2020 0042   HDL 32 (L) 08/03/2020 0042   CHOLHDL 3.3 08/03/2020 0042   VLDL 12 08/03/2020 0042   LDLCALC 60 08/03/2020 0042    Other Studies Reviewed Today:  Coronary Artery Bypass Grafting x 2 on 08/06/2020 Left Internal Mammary Artery to Distal Left Anterior Descending Coronary Artery; pedicled right internal mammary artery graft to first obtuse Marginal Branch of Left Circumflex Coronary Artery. Bilateral internal mammary artery harvesting   08/02/2020 RIGHT/LEFT HEART CATH AND CORONARY ANGIOGRAPHY  Conclusion  Conclusions: 1. Severe ostial LMCA stenosis (~80%) with pressure dampening of 107F diagnostic catheter.  There is no significant improvement with intracoronary nitroglycerin. 2. Mild, non-obstructive coronary artery disease involving LAD and dominant LCx. 3. Normal left and right heart filling  pressures. 4. Normal Fick cardiac output/index. 5. Right radial artery loop.  Recommendations: 1. Transfer to [facility] for cardiac surgery consultation.  If the patient is not a surgical candidate, her LMCA stenosis is treatable percutaneously, though this would require hemodynamic support given reduced LVEF and left-dominant system. 2. Hold clopidogrel pending cardiac surgery evaluation. 3. Avoid right radial artery access for future catheterization. 4. Aggressive secondary prevention of coronary artery disease.  Diagnostic Dominance: Left    Vascular dopplers carotids / arms / legs 08/04/2020 Right Carotid: Velocities in the right ICA are consistent with a 1-39% stenosis. Left Carotid: Velocities in the left ICA are consistent with a 1-39% stenosis. Vertebrals: Bilateral vertebral arteries demonstrate antegrade flow. Right ABI: Resting right ankle-brachial index indicates noncompressible right lower extremity arteries. Left ABI: Resting left ankle-brachial index indicates noncompressible left lower extremity arteries. Right Upper Extremity: Doppler waveforms decrease >50% with right radial compression. Doppler waveform obliterate with right ulnar compression. Left Upper Extremity: Doppler waveform obliterate with left radial compression. Doppler waveform obliterate with left ulnar compression.   ECHO TEE 07/18/2020 1. No evidence of intracardiac thrombus. 2. Question small tunnel in the interatrial septum. No evidence of right to left shunt by color Dopper or bubble study. 3. Left ventricular ejection fraction, by estimation, is 30 to 35%. The left ventricle has moderately decreased function. The left ventricular internal cavity size was moderately dilated. 4. Right ventricular systolic function is normal. The right ventricular size is normal. 5. No left atrial/left atrial appendage thrombus was detected. The LAA emptying velocity was 67 cm/s. 6. The mitral valve is normal in  structure. Mild to moderate mitral valve regurgitation. 7. The aortic valve is tricuspid. Aortic valve regurgitation is not visualized. 8. There is mild (Grade II) plaque involving the descending aorta. 9. Agitated saline contrast bubble study was negative, with no evidence of any interatrial shunt.   Event monitor 06/19/20: Study Highlights  The patient was monitored for 13 days, 11 hours.  The predominant rhythm was sinus with an average rate of 89 bpm (range 40-166 bpm).  There were rare PAC's and PVC's.  Episodes of Mobitz type 1 second degree AV block were noted.  There was no sustained arrhythmia or prolonged pause, including no evidence of atrial fibrillation/flutter.  Patient triggered events correspond to sinus rhythm, PVC's, PAC's, and Mobitz type 1 second degree AV block.  Predominantly sinus rhythm with rare PAC's and PVC's, as well as episodes of Mobitz type 1 second degree AV block.    Assessment and Plan:  1. S/P CABG x 2   2. CAD in native artery   3. HFrEF (heart failure with reduced ejection fraction) ([place])   4. Essential hypertension  5. Hyperlipidemia LDL goal <70    1. S/P CABG x 2  CABG with LIMA to distal LAD and pedicled RIMA to first obtuse marginal/LCx, and clipping of LAA.  08/06/2020 by Dr. [name].  She has a follow-up chest x-ray and follow-up with Dr. Julien Girt on December 13 2 PM.  2. CAD in native artery Severe ostial LMCA stenosis (~80%) with pressure dampening of 67F diagnostic catheter, with Mild, non-obstructive coronary artery disease involving LAD and dominant LCx continue aspirin 81 mg, Plavix 75 mg, Isorbid dinitrate 10 mg 3 times daily,  3. HFrEF (heart failure with reduced ejection fraction) ([place]) Recent echocardiogram 07/18/2020 EF 30 to 35%.  Mild to moderate MR, grade 2 plaque involving descending aorta, no intra-atrial shunt with bubble study  4. Essential hypertension Blood pressure 130/70 today.  5. Hyperlipidemia LDL goal  <70 Continue atorvastatin 80 mg daily.  6.  Tachycardia EKG today shows sinus tachycardia with a rate of 121.  We will increase metoprolol to 50 mg p.o. twice daily until she sees Dr. Julien Girt on December 13.  Some of the tachycardia may be influenced by severe anxiety.  She denies any shortness of breath, dizziness, chest pain.  Medication Adjustments/Labs and Tests Ordered: Current medicines are reviewed at length with the patient today.  Concerns regarding medicines are outlined above.   Disposition: Follow-up with cardiology in [place] 3 months.  Has not designated primary cardiologist yet  Signed, Levell July, NP 08/16/2020 8:42 PM    [facility] at [place], [place], [place] [address] Phone: [phone]; Fax: [phone]

## 2020-08-16 NOTE — Telephone Encounter (Signed)
Cough could be due to a number of things.  It is not unexpected to have some chest wall soreness with cough after recent surgery.  She could try an over-the-counter cough suppressant such as Robitussin or Mucinex.  Further evaluation to be performed tomorrow when she follows up in the [place] office.  If symptoms worsen in the meantime, she should seek immediate medical attention.  [name], MD [facility]

## 2020-08-16 NOTE — Telephone Encounter (Signed)
Pt c/o coughing spells with clear mucus production since procedure CABG 11/28 and right sided chest pain lasting a few minutes at a time and rating pain 5/10 happening only a few times since procedure - pt was concerned about symptoms - denies dizziness/SOB/swelling - has f/u appt with NP tomorrow in [place] office - pt request message be sent to Dr End for any further recs than monitor and keep f/u appt

## 2020-08-16 NOTE — Telephone Encounter (Signed)
Pt voiced understanding

## 2020-08-17 ENCOUNTER — Ambulatory Visit (INDEPENDENT_AMBULATORY_CARE_PROVIDER_SITE_OTHER): Payer: 59 | Admitting: Family Medicine

## 2020-08-17 ENCOUNTER — Other Ambulatory Visit: Payer: Self-pay

## 2020-08-17 ENCOUNTER — Other Ambulatory Visit: Payer: Self-pay | Admitting: Family Medicine

## 2020-08-17 ENCOUNTER — Encounter: Payer: Self-pay | Admitting: Family Medicine

## 2020-08-17 VITALS — BP 130/70 | Ht 66.0 in | Wt 170.0 lb

## 2020-08-17 DIAGNOSIS — I2510 Atherosclerotic heart disease of native coronary artery without angina pectoris: Secondary | ICD-10-CM | POA: Diagnosis not present

## 2020-08-17 DIAGNOSIS — I10 Essential (primary) hypertension: Secondary | ICD-10-CM | POA: Diagnosis not present

## 2020-08-17 DIAGNOSIS — Z951 Presence of aortocoronary bypass graft: Secondary | ICD-10-CM | POA: Diagnosis not present

## 2020-08-17 DIAGNOSIS — I5020 Unspecified systolic (congestive) heart failure: Secondary | ICD-10-CM | POA: Diagnosis not present

## 2020-08-17 DIAGNOSIS — E785 Hyperlipidemia, unspecified: Secondary | ICD-10-CM

## 2020-08-17 MED ORDER — METOPROLOL TARTRATE 50 MG PO TABS: 50.0000 mg | ORAL_TABLET | Freq: Two times a day (BID) | ORAL | 3 refills | Status: DC

## 2020-08-17 MED ORDER — METOPROLOL TARTRATE 37.5 MG PO TABS: 50.0000 mg | ORAL_TABLET | Freq: Two times a day (BID) | ORAL | 2 refills | Status: DC

## 2020-08-17 NOTE — Patient Instructions (Signed)
Medication Instructions:  INCREASE Metoprolol Tartrate to 50 mg twice a day, *If you need a refill on your cardiac medications before your next appointment, please call your pharmacy*   Lab Work: None Today If you have labs (blood work) drawn today and your tests are completely normal, you will receive your results only by: Marland Kitchen MyChart Message (if you have MyChart) OR . A paper copy in the mail If you have any lab test that is abnormal or we need to change your treatment, we will call you to review the results.   Testing/Procedures: EKG today   Follow-Up: At [facility], you and your health needs are our priority.  As part of our continuing mission to provide you with exceptional heart care, we have created designated Provider Care Teams.  These Care Teams include your primary Cardiologist (physician) and Advanced Practice Providers (APPs -  Physician Assistants and Nurse Practitioners) who all work together to provide you with the care you need, when you need it.  We recommend signing up for the patient portal called "MyChart".  Sign up information is provided on this After Visit Summary.  MyChart is used to connect with patients for Virtual Visits (Telemedicine).  Patients are able to view lab/test results, encounter notes, upcoming appointments, etc.  Non-urgent messages can be sent to your provider as well.   To learn more about what you can do with MyChart, go to [email or web address].    Your next appointment:   3 month(s)  The format for your next appointment:   In Person  Provider:    Office Visit to establish care in [place].    Other Instructions None

## 2020-08-17 NOTE — Addendum Note (Signed)
Addended by: Christella Scheuermann C on: 08/17/2020 10:42 AM   Modules accepted: Orders

## 2020-08-18 ENCOUNTER — Other Ambulatory Visit: Payer: Self-pay | Admitting: Cardiothoracic Surgery

## 2020-08-18 DIAGNOSIS — Z951 Presence of aortocoronary bypass graft: Secondary | ICD-10-CM

## 2020-08-20 ENCOUNTER — Other Ambulatory Visit: Payer: Self-pay | Admitting: Physician Assistant

## 2020-08-21 ENCOUNTER — Ambulatory Visit
Admission: RE | Admit: 2020-08-21 | Discharge: 2020-08-21 | Disposition: A | Discharge status: Home / Self Care | Payer: 59 | Source: Ambulatory Visit | Attending: Cardiothoracic Surgery | Admitting: Cardiothoracic Surgery

## 2020-08-21 ENCOUNTER — Encounter: Payer: Self-pay | Admitting: Cardiothoracic Surgery

## 2020-08-21 ENCOUNTER — Other Ambulatory Visit: Payer: Self-pay

## 2020-08-21 ENCOUNTER — Ambulatory Visit (INDEPENDENT_AMBULATORY_CARE_PROVIDER_SITE_OTHER): Payer: Self-pay | Admitting: Cardiothoracic Surgery

## 2020-08-21 VITALS — BP 127/76 | HR 117 | Temp 97.5°F | Resp 18 | Ht 66.0 in | Wt 185.4 lb

## 2020-08-21 DIAGNOSIS — Z951 Presence of aortocoronary bypass graft: Secondary | ICD-10-CM

## 2020-08-21 MED ORDER — LORAZEPAM 0.5 MG PO TABS: 0.5000 mg | ORAL_TABLET | Freq: Three times a day (TID) | ORAL | 0 refills | Status: DC | PRN

## 2020-08-23 ENCOUNTER — Telehealth: Payer: Self-pay | Admitting: *Deleted

## 2020-08-23 ENCOUNTER — Institutional Professional Consult (permissible substitution): Payer: 59 | Admitting: Cardiology

## 2020-08-23 NOTE — Telephone Encounter (Signed)
Received VM from [name] seeking advise for BP of 150/90. Pt states this elevated pressure scares her. Attempted to call [name] back. VM left for return call.

## 2020-08-25 ENCOUNTER — Other Ambulatory Visit: Payer: Self-pay

## 2020-08-25 ENCOUNTER — Ambulatory Visit (HOSPITAL_COMMUNITY)
Admission: RE | Admit: 2020-08-25 | Discharge: 2020-08-25 | Disposition: A | Discharge status: Home / Self Care | Payer: 59 | Source: Ambulatory Visit | Attending: Internal Medicine | Admitting: Internal Medicine

## 2020-08-25 DIAGNOSIS — I639 Cerebral infarction, unspecified: Secondary | ICD-10-CM

## 2020-08-25 NOTE — Telephone Encounter (Signed)
Patient scheduled for TCD for today at [facility].

## 2020-08-25 NOTE — Progress Notes (Signed)
TCD bubble study has been completed.   Preliminary results in CV Proc.   Stacey Richardson 08/25/2020 3:20 PM

## 2020-08-25 NOTE — Progress Notes (Signed)
[address]       [place] [address]             [phone]     CARDIOTHORACIC SURGERY OFFICE NOTE  Referring Provider is End, Harrell Gave, MD Primary Cardiologist is [name], MD PCP is [name], [name], MD   HPI:  54 year old lady presented with left main coronary artery disease and depressed LV function.  She underwent CABG x2.  She now presents for her initial postoperative evaluation.  She has no complaints other than some mild anxiety.   Current Outpatient Medications  Medication Sig Dispense Refill  . acetaminophen (TYLENOL) 500 MG tablet Take 500 mg by mouth every 6 (six) hours as needed for moderate pain or headache.    Marland Kitchen aspirin EC 81 MG tablet Take 81 mg by mouth daily.     Marland Kitchen atorvastatin (LIPITOR) 80 MG tablet Take 80 mg by mouth at bedtime.    . clopidogrel (PLAVIX) 75 MG tablet Take 1 tablet (75 mg total) by mouth daily. 90 tablet 0  . colchicine 0.6 MG tablet Take 1 tablet (0.6 mg total) by mouth 2 (two) times daily. 30 tablet 1  . ferrous sulfate 325 (65 FE) MG tablet Take 325 mg by mouth 3 (three) times daily with meals.     . furosemide (LASIX) 40 MG tablet Take 1 tablet (40 mg total) by mouth daily. 7 tablet 0  . isosorbide dinitrate (ISORDIL) 10 MG tablet Take 1 tablet (10 mg total) by mouth 3 (three) times daily. 90 tablet 0  . magnesium oxide (MAG-OX) 400 (241.3 Mg) MG tablet Take 1 tablet (400 mg total) by mouth 2 (two) times daily. 30 tablet 0  . magnesium oxide (MAG-OX) 400 MG tablet Take 1 tablet by mouth 2 (two) times daily.    . metFORMIN (GLUCOPHAGE-XR) 500 MG 24 hr tablet Take 1,000 mg by mouth 2 (two) times daily.    . metoprolol tartrate (LOPRESSOR) 50 MG tablet Take 1 tablet (50 mg total) by mouth 2 (two) times daily. 180 tablet 3  . potassium chloride SA (KLOR-CON) 20 MEQ tablet Take 1 tablet (20 mEq total) by mouth daily. 15 tablet 0  . tetrahydrozoline 0.05 % ophthalmic solution Place 1-2 drops into both  eyes 3 (three) times daily as needed (irritated eyes.).    Marland Kitchen LORazepam (ATIVAN) 0.5 MG tablet Take 1 tablet (0.5 mg total) by mouth every 8 (eight) hours as needed for anxiety. 30 tablet 0   No current facility-administered medications for this visit.      Physical Exam:   BP 127/76 (BP Location: Left Arm, Patient Position: Sitting)   Pulse (!) 117   Temp (!) 97.5 F (36.4 C)   Resp 18   Ht 5\' 6"  (1.676 m)   Wt 84.1 kg   LMP 07/25/2020 Comment: hcg neg today  SpO2 95% Comment: RA with mask on  BMI 29.92 kg/m   General:  Well-appearing no acute distress  Chest:   Clear to auscultation bilaterally  CV:   Tachycardic but regular  Incisions:  Healing well  Abdomen:  Soft nontender  Extremities:  Mild edema  Diagnostic Tests:  X-ray with clear lung fields   Impression:  54 year old lady status post CABG x2 doing very well  Plan:  Follow-up as needed Okay to stop Lasix/potassium Prescription given for Ativan as needed  I spent in excess of 20 minutes during the conduct of this office consultation and >50% of this time  involved direct face-to-face encounter with the patient for counseling and/or coordination of their care.  Level 2                 10 minutes Level 3                 15 minutes Level 4                 25 minutes Level 5                 40 minutes  B.  [name], MD 08/25/2020 11:32 AM

## 2020-08-29 ENCOUNTER — Telehealth: Payer: Self-pay | Admitting: *Deleted

## 2020-08-29 NOTE — Telephone Encounter (Signed)
Left voicemail message to call back for review of results and recommendations.  

## 2020-08-29 NOTE — Telephone Encounter (Signed)
Spoke with patient to review results and she was very tearful not understanding what this is and very upset. Reviewed that this is common and is something that most people have from birth. She did not understand and wanted to have answers. Will discuss with provider here in office to see if I should schedule her appointment or how I may be able to comfort her.

## 2020-08-29 NOTE — Telephone Encounter (Signed)
Discussed with patient the results are to help them determine cause of stroke. Reviewed that until providers review and discuss those results we should hold off on appointment. Discussed how this is from birth and the test was to help them determine if this could be the cause of her stroke and then come up with plan of care for her. Advised that they would then reach out to her with information and review of plan. She was very tearful and provided emotional support. She verbalized understanding of our conversation, agreement with plan, and had no further questions at this time.

## 2020-08-29 NOTE — Telephone Encounter (Signed)
-----   Message from [name], MD sent at 08/28/2020  9:19 PM EST ----- Please let Ms. Zeidan know that her bubble study is positive.  I will review her case with Dr. Burt Knack to determine if PFO closure would be beneficial.

## 2020-08-29 NOTE — Telephone Encounter (Signed)
Patient returning call.

## 2020-08-30 NOTE — Telephone Encounter (Signed)
Thank you for the update.  I will reach out to her after reviewing her case with Dr. Burt Knack.  It would also be helpful for her to see Dr. Leonie Man (scheduled for next month) to get his thoughts regarding CVA and abnormal transcranial bubble study.  Of note, it appears that the patient is now being followed through our [place] office.  Does she intent to continue receiving her cardiology care there or with [place]?  [name], MD [facility]

## 2020-09-04 ENCOUNTER — Other Ambulatory Visit: Payer: Self-pay

## 2020-09-04 ENCOUNTER — Emergency Department
Admission: EM | Admit: 2020-09-04 | Discharge: 2020-09-04 | Disposition: A | Discharge status: Home / Self Care | Payer: 59 | Attending: Emergency Medicine | Admitting: Emergency Medicine

## 2020-09-04 ENCOUNTER — Ambulatory Visit: Payer: Self-pay

## 2020-09-04 ENCOUNTER — Encounter: Payer: Self-pay | Admitting: Emergency Medicine

## 2020-09-04 ENCOUNTER — Emergency Department: Payer: 59

## 2020-09-04 DIAGNOSIS — E1165 Type 2 diabetes mellitus with hyperglycemia: Secondary | ICD-10-CM | POA: Diagnosis not present

## 2020-09-04 DIAGNOSIS — Z8673 Personal history of transient ischemic attack (TIA), and cerebral infarction without residual deficits: Secondary | ICD-10-CM | POA: Insufficient documentation

## 2020-09-04 DIAGNOSIS — Z7982 Long term (current) use of aspirin: Secondary | ICD-10-CM | POA: Insufficient documentation

## 2020-09-04 DIAGNOSIS — Z87891 Personal history of nicotine dependence: Secondary | ICD-10-CM | POA: Insufficient documentation

## 2020-09-04 DIAGNOSIS — U071 COVID-19: Secondary | ICD-10-CM | POA: Insufficient documentation

## 2020-09-04 DIAGNOSIS — Z7902 Long term (current) use of antithrombotics/antiplatelets: Secondary | ICD-10-CM | POA: Diagnosis not present

## 2020-09-04 DIAGNOSIS — I10 Essential (primary) hypertension: Secondary | ICD-10-CM | POA: Insufficient documentation

## 2020-09-04 DIAGNOSIS — Z951 Presence of aortocoronary bypass graft: Secondary | ICD-10-CM | POA: Insufficient documentation

## 2020-09-04 DIAGNOSIS — Z7984 Long term (current) use of oral hypoglycemic drugs: Secondary | ICD-10-CM | POA: Insufficient documentation

## 2020-09-04 DIAGNOSIS — I2510 Atherosclerotic heart disease of native coronary artery without angina pectoris: Secondary | ICD-10-CM | POA: Diagnosis not present

## 2020-09-04 DIAGNOSIS — Z79899 Other long term (current) drug therapy: Secondary | ICD-10-CM | POA: Diagnosis not present

## 2020-09-04 DIAGNOSIS — R0981 Nasal congestion: Secondary | ICD-10-CM | POA: Diagnosis present

## 2020-09-04 LAB — RESP PANEL BY RT-PCR (FLU A&B, COVID) ARPGX2
Influenza A by PCR: NEGATIVE
Influenza B by PCR: NEGATIVE
SARS Coronavirus 2 by RT PCR: POSITIVE — AB

## 2020-09-04 NOTE — ED Provider Notes (Signed)
[facility] Emergency Department Provider Note  ____________________________________________   Event Date/Time   First MD Initiated Contact with Patient 09/04/20 1855     (approximate)  I have reviewed the triage vital signs and the nursing notes.   HISTORY  Chief Complaint Nasal congestion  HPI BRYAR WESTWOOD is a 54 y.o. female who presents to the emergency department for evaluation of nasal congestion and pressure.  Patient states that she has been Covid vaccinated and has not been around any known Covid exposures.  States that symptoms started yesterday.  She has a mild, intermittent dry cough associated.  She reports temperature has been around 99.5. she denies chest pain, shortness of breath, abdominal pain, nausea vomiting or diarrhea.  Patient is approximately 1 month status post CABG procedure and is primarily concerned about her heart.  Patient is also a known diabetic.       Past Medical History:  Diagnosis Date  . CAD (coronary artery disease)   . CVA (cerebral vascular accident) ([place])   . Diabetes mellitus   . HFrEF (heart failure with reduced ejection fraction) ([place])   . HLD (hyperlipidemia)   . Hypertension     Patient Active Problem List   Diagnosis Date Noted  . Left main coronary artery disease 08/02/2020  . Chronic HFrEF (heart failure with reduced ejection fraction) ([place]) 07/26/2020  . Lightheadedness 07/26/2020  . HFrEF (heart failure with reduced ejection fraction) ([place]) 06/20/2020  . Cardiomyopathy ([place]) 06/20/2020  . Hyperlipidemia LDL goal <70 06/20/2020  . Hyperglycemia   . Cryptogenic stroke ([place])   . TIA (transient ischemic attack) 06/12/2020  . Multifocal pneumonia 01/22/2019  . Type 2 diabetes mellitus without complication ([place])   . Essential hypertension     Past Surgical History:  Procedure Laterality Date  . CLIPPING OF ATRIAL APPENDAGE N/A 08/06/2020   Procedure: CLIPPING OF ATRIAL APPENDAGE USING ATRICURE  CLIP SIZE 40MM;  Surgeon: Wonda Olds, MD;  Location: [place];  Service: Open Heart Surgery;  Laterality: N/A;  . CORONARY ARTERY BYPASS GRAFT N/A 08/06/2020   Procedure: CORONARY ARTERY BYPASS GRAFTING (CABG) X2, USING BILATERAL INTERNAL MAMMARY ARTERIES;  Surgeon: Wonda Olds, MD;  Location: [place];  Service: Open Heart Surgery;  Laterality: N/A;  . KNEE SURGERY    . RIGHT/LEFT HEART CATH AND CORONARY ANGIOGRAPHY Bilateral 08/02/2020   Procedure: RIGHT/LEFT HEART CATH AND CORONARY ANGIOGRAPHY;  Surgeon: [name], MD;  Location: [place] [facility];  Service: Cardiovascular;  Laterality: Bilateral;  . TEE WITHOUT CARDIOVERSION N/A 07/18/2020   Procedure: TRANSESOPHAGEAL ECHOCARDIOGRAM (TEE);  Surgeon: [name], MD;  Location: [facility];  Service: Cardiovascular;  Laterality: N/A;  . TEE WITHOUT CARDIOVERSION N/A 08/06/2020   Procedure: TRANSESOPHAGEAL ECHOCARDIOGRAM (TEE);  Surgeon: Wonda Olds, MD;  Location: [place];  Service: Open Heart Surgery;  Laterality: N/A;    Prior to Admission medications   Medication Sig Start Date End Date Taking? Authorizing Provider  acetaminophen (TYLENOL) 500 MG tablet Take 500 mg by mouth every 6 (six) hours as needed for moderate pain or headache.    [provider]  aspirin EC 81 MG tablet Take 81 mg by mouth daily.     [provider]  atorvastatin (LIPITOR) 80 MG tablet Take 80 mg by mouth at bedtime. 07/14/20   [provider]  clopidogrel (PLAVIX) 75 MG tablet Take 1 tablet (75 mg total) by mouth daily. 06/14/20   Lorella Nimrod, MD  colchicine 0.6 MG tablet Take  1 tablet (0.6 mg total) by mouth 2 (two) times daily. 08/11/20   Elgie Collard, PA-C  ferrous sulfate 325 (65 FE) MG tablet Take 325 mg by mouth 3 (three) times daily with meals.     [provider]  furosemide (LASIX) 40 MG tablet Take 1 tablet (40 mg total) by mouth daily. 08/11/20   Elgie Collard, PA-C  isosorbide dinitrate  (ISORDIL) 10 MG tablet Take 1 tablet (10 mg total) by mouth 3 (three) times daily. 08/11/20   Elgie Collard, PA-C  LORazepam (ATIVAN) 0.5 MG tablet Take 1 tablet (0.5 mg total) by mouth every 8 (eight) hours as needed for anxiety. 08/21/20 08/21/21  Wonda Olds, MD  magnesium oxide (MAG-OX) 400 (241.3 Mg) MG tablet Take 1 tablet (400 mg total) by mouth 2 (two) times daily. 08/11/20   Elgie Collard, PA-C  magnesium oxide (MAG-OX) 400 MG tablet Take 1 tablet by mouth 2 (two) times daily. 08/11/20   [provider]  metFORMIN (GLUCOPHAGE-XR) 500 MG 24 hr tablet Take 1,000 mg by mouth 2 (two) times daily. 06/08/20   [provider]  metoprolol tartrate (LOPRESSOR) 50 MG tablet Take 1 tablet (50 mg total) by mouth 2 (two) times daily. 08/17/20 11/15/20  Verta Ellen., NP  potassium chloride SA (KLOR-CON) 20 MEQ tablet Take 1 tablet (20 mEq total) by mouth daily. 08/11/20   Elgie Collard, PA-C  tetrahydrozoline 0.05 % ophthalmic solution Place 1-2 drops into both eyes 3 (three) times daily as needed (irritated eyes.).    [provider]    Allergies Peanut butter flavor and Lisinopril  Family History  Problem Relation Age of Onset  . Heart disease Mother        a. ?valve    Social History Social History   Tobacco Use  . Smoking status: Former Research scientist (life sciences)  . Smokeless tobacco: Never Used  Vaping Use  . Vaping Use: Never used  Substance Use Topics  . Alcohol use: Yes    Comment: social  . Drug use: No    Review of Systems Constitutional: No fever/chills Eyes: No visual changes. ENT: + Nasal congestion, no sore throat. Cardiovascular: Denies chest pain. Respiratory: + Intermittent dry cough, denies shortness of breath. Gastrointestinal: No abdominal pain.  No nausea, no vomiting.  No diarrhea.  No constipation. Genitourinary: Negative for dysuria. Musculoskeletal: Negative for back pain. Skin: Negative for rash. Neurological: + headaches, negative for  focal weakness or numbness.  ____________________________________________   PHYSICAL EXAM:  VITAL SIGNS: ED Triage Vitals  Enc Vitals Group     BP 09/04/20 1754 (!) 154/89     Pulse Rate 09/04/20 1754 (!) 110     Resp 09/04/20 1754 16     Temp 09/04/20 1754 99.4 F (37.4 C)     Temp Source 09/04/20 1754 Oral     SpO2 09/04/20 1754 100 %     Weight 09/04/20 1752 185 lb 6.5 oz (84.1 kg)     Height 09/04/20 1752 5\' 6"  (1.676 m)     Head Circumference --      Peak Flow --      Pain Score 09/04/20 1752 0     Pain Loc --      Pain Edu? --      Excl. in [place]? --    Constitutional: Alert and oriented.  Anxious appearing but in no acute distress. Eyes: Conjunctivae are normal. PERRL. EOMI. Head: Atraumatic. Nose: No congestion/rhinnorhea. Mouth/Throat: Mucous membranes  are moist.  Oropharynx non-erythematous. Neck: No stridor.   Lymphatic: No cervical lymphadenopathy Cardiovascular: Tachycardic, regular rhythm. Grossly normal heart sounds.  Good peripheral circulation. Respiratory: Normal respiratory effort.  No retractions. Lungs CTAB. Gastrointestinal: Soft and nontender. No distention. No abdominal bruits. No CVA tenderness. Musculoskeletal: No lower extremity tenderness nor edema.  No joint effusions. Neurologic:  Normal speech and language.  Cranial nerves II through XII grossly intact.  No gross focal neurologic deficits are appreciated. No gait instability. Skin:  Skin is warm, dry and intact. No rash noted. Psychiatric: Mood and affect are normal. Speech and behavior are normal.  ____________________________________________   LABS (all labs ordered are listed, but only abnormal results are displayed)  Labs Reviewed  RESP PANEL BY RT-PCR (FLU A&B, COVID) ARPGX2 - Abnormal; Notable for the following components:      Result Value   SARS Coronavirus 2 by RT PCR POSITIVE (*)    All other components within normal limits    ____________________________________________  EKG  Sinus tachycardia with a rate of 114.  No ST elevations or depressions, no evidence of acute ischemia. ____________________________________________  RADIOLOGY I, Marlana Salvage, personally viewed and evaluated these images (plain radiographs) as part of my medical decision making, as well as reviewing the written report by the radiologist.  ED provider interpretation: No focal pneumonia, postsurgical clips present  Official radiology report(s): DG Chest Portable 1 View  Result Date: 09/04/2020 CLINICAL DATA:  Cough, upper respiratory infection symptoms, status post CABG. EXAM: PORTABLE CHEST 1 VIEW.  Patient is rotated. COMPARISON:  Chest x-ray 08/21/2020 FINDINGS: Cardiac surgical changes including coronary artery bypass and atrial appendage clip. The heart size and mediastinal contours are unchanged. No focal consolidation. No pulmonary edema. No pleural effusion. No pneumothorax. No acute osseous abnormality. IMPRESSION: No active disease. Electronically Signed   By: Iven Finn M.D.   On: 09/04/2020 19:40    ____________________________________________   INITIAL IMPRESSION / ASSESSMENT AND PLAN / ED COURSE  As part of my medical decision making, I reviewed the following data within the [place] notes reviewed and incorporated, Labs reviewed and Notes from prior ED visits        Patient is a 54 year old female who presents to the emergency department for evaluation of nasal congestion, head pressure.  See HPI for further details.  On physical exam, the patient appears anxious, but overall clinically appears well.  There are no abnormal heart or lung sounds, no neural deficits appreciated.  The patient was tested for Covid and flu, is positive for Covid.  She expressed significant concern about being scared of Covid and her outcomes.  The patient is vaccinated.  EKG reveals sinus tachycardia, chest  x-ray negative for any acute findings.  Suspect that the patient's tachycardia is likely related to her fear of Covid as she does not have any chest pain, shortness of breath or other concerning findings.  Discussed return precautions in detail with the patient given her high risk status with being status post CABG procedure, diabetic, status post CVA.  Patient is in understanding and will return to the emergency department if she experiences any acute worsening.  Supportive care was discussed in the interim, but patient was cautioned against the use of over-the-counter cough/cold medications without consulting her cardiologist given their effects on blood pressure in the heart. Patient will follow up with primary care.      ____________________________________________   FINAL CLINICAL IMPRESSION(S) / ED DIAGNOSES  Final diagnoses:  COVID     ED Discharge Orders    None      *Please note:  Lovetta L Espiritu was evaluated in Emergency Department on 09/04/2020 for the symptoms described in the history of present illness. She was evaluated in the context of the global COVID-19 pandemic, which necessitated consideration that the patient might be at risk for infection with the SARS-CoV-2 virus that causes COVID-19. Institutional protocols and algorithms that pertain to the evaluation of patients at risk for COVID-19 are in a state of rapid change based on information released by regulatory bodies including the CDC and federal and state organizations. These policies and algorithms were followed during the patient's care in the ED.  Some ED evaluations and interventions may be delayed as a result of limited staffing during and the pandemic.*   Note:  This document was prepared using Dragon voice recognition software and may include unintentional dictation errors.    Lucy Chris, PA 09/04/20 2016    Phineas Semen, MD 09/04/20 2111

## 2020-09-04 NOTE — Telephone Encounter (Signed)
Patient called and says she has sinus congestion and pain to her forehead, runny nose and cough. She says the runny nose and congestion has been ongoing x 4 days and the headache for a couple of days. She says she's taking Mucinex and Tylenol daily, because she didn't want to take too much. I advised Mucinex BID and Tylenol every 4-6 hours. I advised to call PCP to schedule a visit. She says the earliest appointment is January 10th, so she is scheduled then. I advised to go to the UC or do an e-visit and to keep the appointment with PCP as a follow up. She verbalized understanding and says she will do an e-visit.  Reason for Disposition . [1] Sinus congestion (pressure, fullness) AND [2] present > 10 days  Answer Assessment - Initial Assessment Questions 1. LOCATION: "Where does it hurt?"      Forehead  2. ONSET: "When did the sinus pain start?"  (e.g., hours, days)      Yesterday 3. SEVERITY: "How bad is the pain?"   (Scale 1-10; mild, moderate or severe)   - MILD (1-3): doesn't interfere with normal activities    - MODERATE (4-7): interferes with normal activities (e.g., work or school) or awakens from sleep   - SEVERE (8-10): excruciating pain and patient unable to do any normal activities        7 4. RECURRENT SYMPTOM: "Have you ever had sinus problems before?" If Yes, ask: "When was the last time?" and "What happened that time?"      Yes, not this year 5. NASAL CONGESTION: "Is the nose blocked?" If Yes, ask: "Can you open it or must you breathe through the mouth?"     No 6. NASAL DISCHARGE: "Do you have discharge from your nose?" If so ask, "What color?"     Clear  7. FEVER: "Do you have a fever?" If Yes, ask: "What is it, how was it measured, and when did it start?"      No 8. OTHER SYMPTOMS: "Do you have any other symptoms?" (e.g., sore throat, cough, earache, difficulty breathing)     Cough 9. PREGNANCY: "Is there any chance you are pregnant?" "When was your last menstrual period?"      N/A  Protocols used: SINUS PAIN OR CONGESTION-A-AH

## 2020-09-04 NOTE — ED Triage Notes (Signed)
C/O head congestion.  Temp 99.6 x 2 days.  AAOx3.  Skin warm and dry. NAD

## 2020-09-06 ENCOUNTER — Other Ambulatory Visit: Payer: Self-pay

## 2020-09-06 ENCOUNTER — Ambulatory Visit: Payer: 59 | Admitting: Internal Medicine

## 2020-09-06 ENCOUNTER — Emergency Department: Payer: 59

## 2020-09-06 ENCOUNTER — Encounter: Payer: Self-pay | Admitting: Emergency Medicine

## 2020-09-06 DIAGNOSIS — I110 Hypertensive heart disease with heart failure: Secondary | ICD-10-CM | POA: Insufficient documentation

## 2020-09-06 DIAGNOSIS — Z7982 Long term (current) use of aspirin: Secondary | ICD-10-CM | POA: Insufficient documentation

## 2020-09-06 DIAGNOSIS — Z91010 Allergy to peanuts: Secondary | ICD-10-CM | POA: Insufficient documentation

## 2020-09-06 DIAGNOSIS — I2510 Atherosclerotic heart disease of native coronary artery without angina pectoris: Secondary | ICD-10-CM | POA: Diagnosis not present

## 2020-09-06 DIAGNOSIS — U071 COVID-19: Secondary | ICD-10-CM | POA: Insufficient documentation

## 2020-09-06 DIAGNOSIS — R000 Tachycardia, unspecified: Secondary | ICD-10-CM | POA: Diagnosis present

## 2020-09-06 DIAGNOSIS — Z79899 Other long term (current) drug therapy: Secondary | ICD-10-CM | POA: Insufficient documentation

## 2020-09-06 DIAGNOSIS — E1169 Type 2 diabetes mellitus with other specified complication: Secondary | ICD-10-CM | POA: Diagnosis not present

## 2020-09-06 DIAGNOSIS — E785 Hyperlipidemia, unspecified: Secondary | ICD-10-CM | POA: Insufficient documentation

## 2020-09-06 DIAGNOSIS — Z87891 Personal history of nicotine dependence: Secondary | ICD-10-CM | POA: Insufficient documentation

## 2020-09-06 DIAGNOSIS — Z951 Presence of aortocoronary bypass graft: Secondary | ICD-10-CM | POA: Diagnosis not present

## 2020-09-06 DIAGNOSIS — I5023 Acute on chronic systolic (congestive) heart failure: Secondary | ICD-10-CM | POA: Insufficient documentation

## 2020-09-06 LAB — BASIC METABOLIC PANEL
Anion gap: 11 (ref 5–15)
BUN: 13 mg/dL (ref 6–20)
CO2: 22 mmol/L (ref 22–32)
Calcium: 8.8 mg/dL — ABNORMAL LOW (ref 8.9–10.3)
Chloride: 104 mmol/L (ref 98–111)
Creatinine, Ser: 0.61 mg/dL (ref 0.44–1.00)
GFR, Estimated: >60 mL/min (ref >60)
Glucose, Bld: 164 mg/dL — ABNORMAL HIGH (ref 70–99)
Potassium: 3.5 mmol/L (ref 3.5–5.1)
Sodium: 137 mmol/L (ref 135–145)

## 2020-09-06 LAB — CBC
HCT: 32.1 % — ABNORMAL LOW (ref 36.0–46.0)
Hemoglobin: 10.2 g/dL — ABNORMAL LOW (ref 12.0–15.0)
MCH: 26.4 pg (ref 26.0–34.0)
MCHC: 31.8 g/dL (ref 30.0–36.0)
MCV: 82.9 fL (ref 80.0–100.0)
Platelets: 366 10*3/uL (ref 150–400)
RBC: 3.87 MIL/uL (ref 3.87–5.11)
RDW: 14.1 % (ref 11.5–15.5)
WBC: 8.6 10*3/uL (ref 4.0–10.5)
nRBC: 0 % (ref 0.0–0.2)

## 2020-09-06 LAB — TROPONIN I (HIGH SENSITIVITY): Troponin I (High Sensitivity): 5 ng/L (ref <18)

## 2020-09-06 NOTE — ED Triage Notes (Signed)
Pt to ED from home c/o hypertension, tachycardia and hypoxia today.  States checks BP and oxygen at home, states BP was elevated around 160/100 and oxygen down to 89%, called nurse hotline and advised to come to ED.  Pt has hx of TIA, CABG on November.  Was diagnosed COVID positive here 2 days ago.  Denies new SOB, denies pain.  Was started recently on metoprolol and take Plavix at home.

## 2020-09-06 NOTE — ED Notes (Signed)
Pt took 500mg  at 1930 at home.

## 2020-09-07 ENCOUNTER — Emergency Department
Admission: EM | Admit: 2020-09-07 | Discharge: 2020-09-07 | Disposition: A | Discharge status: Home / Self Care | Payer: 59 | Attending: Emergency Medicine | Admitting: Emergency Medicine

## 2020-09-07 DIAGNOSIS — R000 Tachycardia, unspecified: Secondary | ICD-10-CM

## 2020-09-07 DIAGNOSIS — I10 Essential (primary) hypertension: Secondary | ICD-10-CM

## 2020-09-07 DIAGNOSIS — U071 COVID-19: Secondary | ICD-10-CM

## 2020-09-07 LAB — TROPONIN I (HIGH SENSITIVITY): Troponin I (High Sensitivity): 5 ng/L (ref <18)

## 2020-09-07 NOTE — ED Notes (Signed)
Pt sitting up in chair, states her daughter has been waiting in the car for discharge, she is now late for work, pt states she feels "fine," denies pain, unable to wait for further d/c evaluation, pt pleasant and understanding of nursing hold up. D/c instructions reviewed with pt. Verbalized understanding.

## 2020-09-07 NOTE — ED Notes (Signed)
Dr Lenard Lance in with pt.

## 2020-09-07 NOTE — ED Provider Notes (Signed)
[facility] Emergency Department Provider Note  Time seen: 7:47 AM  I have reviewed the triage vital signs and the nursing notes.   HISTORY  Chief Complaint Hypertension   HPI [name] is a 54 y.o. female with a past medical history of CAD, CVA, diabetes, hypertension, hyperlipidemia, CABG 1 month ago, presents to the emergency department for evaluation for high blood pressure and heart rate.  According to the patient ever since her CABG procedure she has had an elevated heart rate she was placed on metoprolol and recently had her dose increased.  Patient states over the past for 5 days she has had nasal congestion low-grade fever, daughter is sick at home as well.  Patient tested positive for Covid on the 27th.  Patient is vaccinated but not yet boosted.  Patient denies any chest pain or shortness of breath.  Patient states at home she checked her blood pressure is 160/100 heart rate was elevated as high as 130.  States her pulse ox read 89 however after several seconds and went to 100.  She called the nurse hotline and they asked her to come to the ED for evaluation.  During my evaluation patient overall appears well but is anxious.  She brought her own blood pressure machine and is checking her blood pressure in the emergency department.  Past Medical History:  Diagnosis Date  . CAD (coronary artery disease)   . CVA (cerebral vascular accident) (HCC)   . Diabetes mellitus   . HFrEF (heart failure with reduced ejection fraction) (HCC)   . HLD (hyperlipidemia)   . Hypertension     Patient Active Problem List   Diagnosis Date Noted  . Left main coronary artery disease 08/02/2020  . Chronic HFrEF (heart failure with reduced ejection fraction) (HCC) 07/26/2020  . Lightheadedness 07/26/2020  . HFrEF (heart failure with reduced ejection fraction) (HCC) 06/20/2020  . Cardiomyopathy (HCC) 06/20/2020  . Hyperlipidemia LDL goal <70 06/20/2020  . Hyperglycemia    . Cryptogenic stroke (HCC)   . TIA (transient ischemic attack) 06/12/2020  . Multifocal pneumonia 01/22/2019  . Type 2 diabetes mellitus without complication (HCC)   . Essential hypertension     Past Surgical History:  Procedure Laterality Date  . CLIPPING OF ATRIAL APPENDAGE N/A 08/06/2020   Procedure: CLIPPING OF ATRIAL APPENDAGE USING ATRICURE CLIP SIZE ;  Surgeon: Linden Dolin, MD;  Location[facility] OR;  Service: Open Heart Surgery;  Laterality: N/A;  . CORONARY ARTERY BYPASS GRAFT N/A 08/06/2020   Procedure: CORONARY ARTERY BYPASS GRAFTING (CABG) X2, USING BILATERAL INTERNAL MAMMARY ARTERIES;  Surgeon: Linden Dolin, MD;  Location: [facility] OR;  Service: Open Heart Surgery;  Laterality: N/A;  . KNEE SURGERY    . RIGHT/LEFT HEART CATH AND CORONARY ANGIOGRAPHY Bilateral 08/02/2020   Procedure: RIGHT/LEFT HEART CATH AND CORONARY ANGIOGRAPHY;  Surgeon: Yvonne Kendall, MD;  Location: [facility] INVASIVE CV LAB;  Service: Cardiovascular;  Laterality: Bilateral;  . TEE WITHOUT CARDIOVERSION N/A 07/18/2020   Procedure: TRANSESOPHAGEAL ECHOCARDIOGRAM (TEE);  Surgeon: Yvonne Kendall, MD;  Location: [facility] ORS;  Service: Cardiovascular;  Laterality: N/A;  . TEE WITHOUT CARDIOVERSION N/A 08/06/2020   Procedure: TRANSESOPHAGEAL ECHOCARDIOGRAM (TEE);  Surgeon: Linden Dolin, MD;  Location: [facility] OR;  Service: Open Heart Surgery;  Laterality: N/A;    Prior to Admission medications   Medication Sig Start Date End Date Taking? Authorizing Provider  acetaminophen (TYLENOL) 500 MG tablet Take 500 mg by mouth every 6 (six) hours as needed for  moderate pain or headache.    [provider]  aspirin EC 81 MG tablet Take 81 mg by mouth daily.     [provider]  atorvastatin (LIPITOR) 80 MG tablet Take 80 mg by mouth at bedtime. 07/14/20   [provider]  clopidogrel (PLAVIX) 75 MG tablet Take 1 tablet (75 mg total) by mouth daily. 06/14/20   Lorella Nimrod, MD  colchicine 0.6 MG  tablet Take 1 tablet (0.6 mg total) by mouth 2 (two) times daily. 08/11/20   Elgie Collard, PA-C  ferrous sulfate 325 (65 FE) MG tablet Take 325 mg by mouth 3 (three) times daily with meals.     [provider]  furosemide (LASIX) 40 MG tablet Take 1 tablet (40 mg total) by mouth daily. 08/11/20   Elgie Collard, PA-C  isosorbide dinitrate (ISORDIL) 10 MG tablet Take 1 tablet (10 mg total) by mouth 3 (three) times daily. 08/11/20   Elgie Collard, PA-C  LORazepam (ATIVAN) 0.5 MG tablet Take 1 tablet (0.5 mg total) by mouth every 8 (eight) hours as needed for anxiety. 08/21/20 08/21/21  Wonda Olds, MD  magnesium oxide (MAG-OX) 400 (241.3 Mg) MG tablet Take 1 tablet (400 mg total) by mouth 2 (two) times daily. 08/11/20   Elgie Collard, PA-C  magnesium oxide (MAG-OX) 400 MG tablet Take 1 tablet by mouth 2 (two) times daily. 08/11/20   [provider]  metFORMIN (GLUCOPHAGE-XR) 500 MG 24 hr tablet Take 1,000 mg by mouth 2 (two) times daily. 06/08/20   [provider]  metoprolol tartrate (LOPRESSOR) 50 MG tablet Take 1 tablet (50 mg total) by mouth 2 (two) times daily. 08/17/20 11/15/20  Verta Ellen., NP  potassium chloride SA (KLOR-CON) 20 MEQ tablet Take 1 tablet (20 mEq total) by mouth daily. 08/11/20   Elgie Collard, PA-C  tetrahydrozoline 0.05 % ophthalmic solution Place 1-2 drops into both eyes 3 (three) times daily as needed (irritated eyes.).    [provider]    Allergies  Allergen Reactions  . Peanut Butter Flavor Anaphylaxis and Swelling  . Lisinopril Cough    Family History  Problem Relation Age of Onset  . Heart disease Mother        a. ?valve    Social History Social History   Tobacco Use  . Smoking status: Former Research scientist (life sciences)  . Smokeless tobacco: Never Used  Vaping Use  . Vaping Use: Never used  Substance Use Topics  . Alcohol use: Yes    Comment: social  . Drug use: No    Review of Systems Constitutional: Negative for  fever. Cardiovascular: Negative for chest pain.  Fast heart rate. Respiratory: Negative for shortness of breath. Gastrointestinal: Negative for abdominal pain Musculoskeletal: Negative for musculoskeletal complaint Neurological: Negative for headache All other ROS negative  ____________________________________________   PHYSICAL EXAM:  VITAL SIGNS: ED Triage Vitals  Enc Vitals Group     BP 09/06/20 2239 (!) 160/90     Pulse Rate 09/06/20 2239 (!) 119     Resp 09/06/20 2239 20     Temp 09/06/20 2239 98.6 F (37 C)     Temp Source 09/06/20 2239 Oral     SpO2 09/06/20 2239 100 %     Weight 09/06/20 2241 181 lb (82.1 kg)     Height 09/06/20 2241 5\' 6"  (1.676 m)     Head Circumference --      Peak Flow --  Pain Score 09/06/20 2240 0     Pain Loc --      Pain Edu? --      Excl. in [place]? --    Constitutional: Alert and oriented. Well appearing and in no distress.  Anxious in appearance. Eyes: Normal exam ENT      Head: Normocephalic and atraumatic.      Mouth/Throat: Mucous membranes are moist. Cardiovascular: regular rhythm around 100 210 bpm. Respiratory: Normal respiratory effort without tachypnea nor retractions. Breath sounds are clear and equal bilaterally. No wheezes/rales/rhonchi. Gastrointestinal: Soft and nontender. No distention.   Musculoskeletal: Nontender with normal range of motion in all extremities.  Neurologic:  Normal speech and language. No gross focal neurologic deficits Skin:  Skin is warm, dry and intact.  Psychiatric: Mood and affect are normal.   ____________________________________________    EKG  EKG viewed and interpreted by myself shows sinus tachycardia 122 bpm with a narrow QRS, normal axis, normal intervals, no concerning ST changes.  ____________________________________________    RADIOLOGY  Chest x-ray is clear  ____________________________________________   INITIAL IMPRESSION / ASSESSMENT AND PLAN / ED COURSE  Pertinent  labs & imaging results that were available during my care of the patient were reviewed by me and considered in my medical decision making (see chart for details).   Patient presents emergency department for elevated blood pressure heart rate, recently tested positive for Covid 3 days ago.  Patient has 100% room air saturation in the emergency department.  When the pulse ox was left on the patient her heart rate would vary between 100-115 bpm but for the most part stayed in the low 100s.  This is currently being managed by her cardiologist and the patient is on metoprolol.  Patient's labs today are reassuring including kidney function and a negative troponin.  EKG shows tachycardia but otherwise no concerning findings.  Chest x-ray is clear.  Had a long discussion with the patient regarding blood pressure and pulse ox.  Reassuringly patient is on day 4 of 5 of illness with clear lungs and a 100% room air saturation.  Discussed with the patient return precautions.  She is also been prescribed lorazepam for anxiety but the patient states she has not been taking it because she is not anxious.  I did discuss taking this medication as prescribed by her doctor if needed.  Patient very reassured after our visit.  Patient will be discharged with supportive care and outpatient follow-up.  Nayvie DAVINEE GRENNAN was evaluated in Emergency Department on 09/07/2020 for the symptoms described in the history of present illness. She was evaluated in the context of the global COVID-19 pandemic, which necessitated consideration that the patient might be at risk for infection with the SARS-CoV-2 virus that causes COVID-19. Institutional protocols and algorithms that pertain to the evaluation of patients at risk for COVID-19 are in a state of rapid change based on information released by regulatory bodies including the CDC and federal and state organizations. These policies and algorithms were followed during the patient's care in the  ED.  ____________________________________________   FINAL CLINICAL IMPRESSION(S) / ED DIAGNOSES  Hypertension Tachycardia COVID-19   Harvest Dark, MD 09/07/20 [phone]

## 2020-09-11 ENCOUNTER — Other Ambulatory Visit: Payer: Self-pay

## 2020-09-11 ENCOUNTER — Encounter: Payer: Self-pay | Admitting: *Deleted

## 2020-09-11 ENCOUNTER — Encounter: Payer: 59 | Attending: Internal Medicine | Admitting: *Deleted

## 2020-09-11 DIAGNOSIS — Z951 Presence of aortocoronary bypass graft: Secondary | ICD-10-CM | POA: Insufficient documentation

## 2020-09-11 NOTE — Progress Notes (Signed)
Virtual orientation call completed today. shehas an appointment on Date: 09/13/2020  for EP eval and gym Orientation.  Documentation of diagnosis can be found in [facility] Date: 08/02/2020.

## 2020-09-13 ENCOUNTER — Ambulatory Visit: Payer: 59

## 2020-09-19 ENCOUNTER — Other Ambulatory Visit: Payer: Self-pay

## 2020-09-19 ENCOUNTER — Encounter: Payer: Self-pay | Admitting: *Deleted

## 2020-09-19 VITALS — Ht 66.5 in | Wt 186.0 lb

## 2020-09-19 DIAGNOSIS — Z951 Presence of aortocoronary bypass graft: Secondary | ICD-10-CM | POA: Diagnosis present

## 2020-09-19 NOTE — Patient Instructions (Signed)
Patient Instructions  Patient Details  Name: Stacey Richardson MRN: [id number] Date of Birth: [birth year] Referring Provider:  [name], MD  Below are your personal goals for exercise, nutrition, and risk factors. Our goal is to help you stay on track towards obtaining and maintaining these goals. We will be discussing your progress on these goals with you throughout the program.  Initial Exercise Prescription:  Initial Exercise Prescription - 09/19/20 1400      Date of Initial Exercise RX and Referring Provider   Date 09/19/20    Referring Provider End      Treadmill   MPH 2.6    Grade 2.5    Minutes 15    METs 3.9      Recumbant Bike   Level 2    RPM 60    Watts 50    Minutes 15    METs 4      NuStep   Level 3    SPM 80    Minutes 15    METs 4      REL-XR   Level 2    Speed 50    Minutes 15    METs 4      T5 Nustep   Level 2    SPM 80    Minutes 15    METs 4      Prescription Details   Frequency (times per week) 3    Duration Progress to 30 minutes of continuous aerobic without signs/symptoms of physical distress      Intensity   THRR 40-80% of Max Heartrate 121-152    Ratings of Perceived Exertion 11-13    Perceived Dyspnea 0-4      Resistance Training   Training Prescription Yes    Weight 3 lb    Reps 10-15           Exercise Goals: Frequency: Be able to perform aerobic exercise two to three times per week in program working toward 2-5 days per week of home exercise.  Intensity: Work with a perceived exertion of 11 (fairly light) - 15 (hard) while following your exercise prescription.  We will make changes to your prescription with you as you progress through the program.   Duration: Be able to do 30 to 45 minutes of continuous aerobic exercise in addition to a 5 minute warm-up and a 5 minute cool-down routine.   Nutrition Goals: Your personal nutrition goals will be established when you do your nutrition analysis with the  dietician.  The following are general nutrition guidelines to follow: Cholesterol < 200mg /day Sodium < 1500mg /day Fiber: Women over 50 yrs - 21 grams per day  Personal Goals:  Personal Goals and Risk Factors at Admission - 09/19/20 1459      Core Components/Risk Factors/Patient Goals on Admission    Weight Management Weight Loss;Yes    Intervention Weight Management/Obesity: Establish reasonable short term and long term weight goals.;Weight Management: Develop a combined nutrition and exercise program designed to reach desired caloric intake, while maintaining appropriate intake of nutrient and fiber, sodium and fats, and appropriate energy expenditure required for the weight goal.;Weight Management: Provide education and appropriate resources to help participant work on and attain dietary goals.    Admit Weight 186 lb (84.4 kg)    Goal Weight: Short Term 180 lb (81.6 kg)    Goal Weight: Long Term 160 lb (72.6 kg)    Expected Outcomes Short Term: Continue to assess and modify interventions until short term weight is  achieved;Long Term: Adherence to nutrition and physical activity/exercise program aimed toward attainment of established weight goal;Weight Loss: Understanding of general recommendations for a balanced deficit meal plan, which promotes 1-2 lb weight loss per week and includes a negative energy balance of [phone] kcal/d    Diabetes Yes    Intervention Provide education about signs/symptoms and action to take for hypo/hyperglycemia.;Provide education about proper nutrition, including hydration, and aerobic/resistive exercise prescription along with prescribed medications to achieve blood glucose in normal ranges: Fasting glucose 65-99 mg/dL    Expected Outcomes Short Term: Participant verbalizes understanding of the signs/symptoms and immediate care of hyper/hypoglycemia, proper foot care and importance of medication, aerobic/resistive exercise and nutrition plan for blood glucose  control.;Long Term: Attainment of HbA1C < 7%.    Intervention Provide education on lifestyle modifcations including regular physical activity/exercise, weight management, moderate sodium restriction and increased consumption of fresh fruit, vegetables, and low fat dairy, alcohol moderation, and smoking cessation.;Monitor prescription use compliance.    Expected Outcomes Short Term: Continued assessment and intervention until BP is < 140/1mm HG in hypertensive participants. < 130/8mm HG in hypertensive participants with diabetes, heart failure or chronic kidney disease.;Long Term: Maintenance of blood pressure at goal levels.    Lipids Yes    Intervention Provide education and support for participant on nutrition & aerobic/resistive exercise along with prescribed medications to achieve LDL 70mg , HDL >40mg .    Expected Outcomes Short Term: Participant states understanding of desired cholesterol values and is compliant with medications prescribed. Participant is following exercise prescription and nutrition guidelines.;Long Term: Cholesterol controlled with medications as prescribed, with individualized exercise RX and with personalized nutrition plan. Value goals: LDL < 70mg , HDL > 40 mg.           Tobacco Use Initial Evaluation: Social History   Tobacco Use  Smoking Status Former Smoker  Smokeless Tobacco Never Used  Tobacco Comment   Quit 30 years ago    Exercise Goals and Review:  Exercise Goals    Row Name 09/19/20 1458             Exercise Goals   Increase Physical Activity Yes       Intervention Provide advice, education, support and counseling about physical activity/exercise needs.;Develop an individualized exercise prescription for aerobic and resistive training based on initial evaluation findings, risk stratification, comorbidities and participant's personal goals.       Expected Outcomes Short Term: Attend rehab on a regular basis to increase amount of physical  activity.;Long Term: Add in home exercise to make exercise part of routine and to increase amount of physical activity.;Long Term: Exercising regularly at least 3-5 days a week.       Increase Strength and Stamina Yes       Intervention Provide advice, education, support and counseling about physical activity/exercise needs.;Develop an individualized exercise prescription for aerobic and resistive training based on initial evaluation findings, risk stratification, comorbidities and participant's personal goals.       Expected Outcomes Short Term: Increase workloads from initial exercise prescription for resistance, speed, and METs.;Short Term: Perform resistance training exercises routinely during rehab and add in resistance training at home;Long Term: Improve cardiorespiratory fitness, muscular endurance and strength as measured by increased METs and functional capacity (6MWT)       Able to understand and use rate of perceived exertion (RPE) scale Yes       Intervention Provide education and explanation on how to use RPE scale       Expected Outcomes  Short Term: Able to use RPE daily in rehab to express subjective intensity level;Long Term:  Able to use RPE to guide intensity level when exercising independently       Able to understand and use Dyspnea scale Yes       Intervention Provide education and explanation on how to use Dyspnea scale       Expected Outcomes Short Term: Able to use Dyspnea scale daily in rehab to express subjective sense of shortness of breath during exertion;Long Term: Able to use Dyspnea scale to guide intensity level when exercising independently       Knowledge and understanding of Target Heart Rate Range (THRR) Yes       Intervention Provide education and explanation of THRR including how the numbers were predicted and where they are located for reference       Expected Outcomes Short Term: Able to state/look up THRR;Short Term: Able to use daily as guideline for intensity in  rehab;Long Term: Able to use THRR to govern intensity when exercising independently       Able to check pulse independently Yes       Intervention Provide education and demonstration on how to check pulse in carotid and radial arteries.;Review the importance of being able to check your own pulse for safety during independent exercise       Expected Outcomes Short Term: Able to explain why pulse checking is important during independent exercise;Long Term: Able to check pulse independently and accurately       Understanding of Exercise Prescription Yes       Intervention Provide education, explanation, and written materials on patient's individual exercise prescription       Expected Outcomes Short Term: Able to explain program exercise prescription;Long Term: Able to explain home exercise prescription to exercise independently              Copy of goals given to participant.

## 2020-09-19 NOTE — Progress Notes (Signed)
Cardiac Individual Treatment Plan  Patient Details  Name: Stacey Richardson MRN: [id number] Date of Birth: 1966-08-10 Referring Provider:   Flowsheet Row Cardiac Rehab from 09/19/2020 in [facility] Cardiac and Pulmonary Rehab  Referring Provider End      Initial Encounter Date:  Flowsheet Row Cardiac Rehab from 09/19/2020 in [facility] Cardiac and Pulmonary Rehab  Date 09/19/20      Visit Diagnosis: S/P CABG x 2  Patient's Home Medications on Admission:  Current Outpatient Medications:  .  acetaminophen (TYLENOL) 500 MG tablet, Take 500 mg by mouth every 6 (six) hours as needed for moderate pain or headache., Disp: , Rfl:  .  aspirin EC 81 MG tablet, Take 81 mg by mouth daily. , Disp: , Rfl:  .  atorvastatin (LIPITOR) 80 MG tablet, Take 80 mg by mouth at bedtime., Disp: , Rfl:  .  clopidogrel (PLAVIX) 75 MG tablet, Take 1 tablet (75 mg total) by mouth daily., Disp: 90 tablet, Rfl: 0 .  colchicine 0.6 MG tablet, Take 1 tablet (0.6 mg total) by mouth 2 (two) times daily. (Patient not taking: Reported on 09/11/2020), Disp: 30 tablet, Rfl: 1 .  ferrous sulfate 325 (65 FE) MG tablet, Take 325 mg by mouth 3 (three) times daily with meals.  (Patient not taking: Reported on 09/11/2020), Disp: , Rfl:  .  furosemide (LASIX) 40 MG tablet, Take 1 tablet (40 mg total) by mouth daily. (Patient not taking: Reported on 09/11/2020), Disp: 7 tablet, Rfl: 0 .  guaiFENesin (ROBITUSSIN) 100 MG/5ML liquid, Take 200 mg by mouth 3 (three) times daily as needed for cough., Disp: , Rfl:  .  isosorbide dinitrate (ISORDIL) 10 MG tablet, Take 1 tablet (10 mg total) by mouth 3 (three) times daily. (Patient not taking: Reported on 09/11/2020), Disp: 90 tablet, Rfl: 0 .  LORazepam (ATIVAN) 0.5 MG tablet, Take 1 tablet (0.5 mg total) by mouth every 8 (eight) hours as needed for anxiety., Disp: 30 tablet, Rfl: 0 .  magnesium oxide (MAG-OX) 400 (241.3 Mg) MG tablet, Take 1 tablet (400 mg total) by mouth 2 (two) times daily. (Patient not taking:  Reported on 09/11/2020), Disp: 30 tablet, Rfl: 0 .  magnesium oxide (MAG-OX) 400 MG tablet, Take 1 tablet by mouth 2 (two) times daily. (Patient not taking: Reported on 09/11/2020), Disp: , Rfl:  .  metFORMIN (GLUCOPHAGE-XR) 500 MG 24 hr tablet, Take 1,000 mg by mouth 2 (two) times daily., Disp: , Rfl:  .  metoprolol tartrate (LOPRESSOR) 50 MG tablet, Take 1 tablet (50 mg total) by mouth 2 (two) times daily., Disp: 180 tablet, Rfl: 3 .  Multiple Vitamin (MULTIVITAMIN) tablet, Take 1 tablet by mouth daily., Disp: , Rfl:  .  potassium chloride SA (KLOR-CON) 20 MEQ tablet, Take 1 tablet (20 mEq total) by mouth daily. (Patient not taking: Reported on 09/11/2020), Disp: 15 tablet, Rfl: 0 .  tetrahydrozoline 0.05 % ophthalmic solution, Place 1-2 drops into both eyes 3 (three) times daily as needed (irritated eyes.)., Disp: , Rfl:   Past Medical History: Past Medical History:  Diagnosis Date  . CAD (coronary artery disease)   . CVA (cerebral vascular accident) ([place])   . Diabetes mellitus   . HFrEF (heart failure with reduced ejection fraction) ([place])   . HLD (hyperlipidemia)   . Hypertension     Tobacco Use: Social History   Tobacco Use  Smoking Status Former Smoker  Smokeless Tobacco Never Used  Tobacco Comment   Quit 30 years ago    Labs:  Recent Review Flowsheet Data    Labs for ITP Cardiac and Pulmonary Rehab Latest Ref Rng & Units 08/06/2020 08/06/2020 08/06/2020 08/07/2020 08/08/2020   Cholestrol 0 - 200 mg/dL - - - - -   LDLCALC 0 - 99 mg/dL - - - - -   HDL >40 mg/dL - - - - -   Trlycerides <150 mg/dL - - - - -   Hemoglobin A1c 4.8 - 5.6 % - - - - -   PHART 7.350 - 7.450 7.380 7.328(L) 7.321(L) - -   PCO2ART 32.0 - 48.0 mmHg 37.4 37.0 38.5 - -   HCO3 20.0 - 28.0 mmol/L 22.3 19.4(L) 19.9(L) - -   TCO2 22 - 32 mmol/L 23 20(L) 21(L) - -   ACIDBASEDEF 0.0 - 2.0 mmol/L 3.0(H) 6.0(H) 6.0(H) - -   O2SAT % 100.0 99.0 99.0 74.6 62.1       Exercise Target Goals: Exercise Program  Goal: Individual exercise prescription set using results from initial 6 min walk test and THRR while considering  patient's activity barriers and safety.   Exercise Prescription Goal: Initial exercise prescription builds to 30-45 minutes a day of aerobic activity, 2-3 days per week.  Home exercise guidelines will be given to patient during program as part of exercise prescription that the participant will acknowledge.   Education: Aerobic Exercise: - Group verbal and visual presentation on the components of exercise prescription. Introduces F.I.T.T principle from ACSM for exercise prescriptions.  Reviews F.I.T.T. principles of aerobic exercise including progression. Written material given at graduation.   Education: Resistance Exercise: - Group verbal and visual presentation on the components of exercise prescription. Introduces F.I.T.T principle from ACSM for exercise prescriptions  Reviews F.I.T.T. principles of resistance exercise including progression. Written material given at graduation.    Education: Exercise & Equipment Safety: - Individual verbal instruction and demonstration of equipment use and safety with use of the equipment. Flowsheet Row Cardiac Rehab from 09/19/2020 in [facility] Cardiac and Pulmonary Rehab  Date 09/19/20  Educator AS  Instruction Review Code 1- Verbalizes Understanding      Education: Exercise Physiology & General Exercise Guidelines: - Group verbal and written instruction with models to review the exercise physiology of the cardiovascular system and associated critical values. Provides general exercise guidelines with specific guidelines to those with heart or lung disease.    Education: Flexibility, Balance, Mind/Body Relaxation: - Group verbal and visual presentation with interactive activity on the components of exercise prescription. Introduces F.I.T.T principle from ACSM for exercise prescriptions. Reviews F.I.T.T. principles of flexibility and balance  exercise training including progression. Also discusses the mind body connection.  Reviews various relaxation techniques to help reduce and manage stress (i.e. Deep breathing, progressive muscle relaxation, and visualization). Balance handout provided to take home. Written material given at graduation.   Activity Barriers & Risk Stratification:  Activity Barriers & Cardiac Risk Stratification - 09/11/20 1142      Activity Barriers & Cardiac Risk Stratification   Activity Barriers None    Cardiac Risk Stratification Moderate           6 Minute Walk:  6 Minute Walk    Row Name 09/19/20 1453         6 Minute Walk   Phase Initial     Distance 1365 feet     Walk Time 6 minutes     # of Rest Breaks 0     MPH 2.58     METS 4.5     RPE 11  Perceived Dyspnea  2     VO2 Peak 15.77     Symptoms No     Resting HR 91 bpm     Resting BP 110/66     Resting Oxygen Saturation  99 %     Exercise Oxygen Saturation  during 6 min walk 98 %     Max Ex. HR 141 bpm     Max Ex. BP 154/82     2 Minute Post BP 116/68            Oxygen Initial Assessment:   Oxygen Re-Evaluation:   Oxygen Discharge (Final Oxygen Re-Evaluation):   Initial Exercise Prescription:  Initial Exercise Prescription - 09/19/20 1400      Date of Initial Exercise RX and Referring Provider   Date 09/19/20    Referring Provider End      Treadmill   MPH 2.6    Grade 2.5    Minutes 15    METs 3.9      Recumbant Bike   Level 2    RPM 60    Watts 50    Minutes 15    METs 4      NuStep   Level 3    SPM 80    Minutes 15    METs 4      REL-XR   Level 2    Speed 50    Minutes 15    METs 4      T5 Nustep   Level 2    SPM 80    Minutes 15    METs 4      Prescription Details   Frequency (times per week) 3    Duration Progress to 30 minutes of continuous aerobic without signs/symptoms of physical distress      Intensity   THRR 40-80% of Max Heartrate 121-152    Ratings of Perceived  Exertion 11-13    Perceived Dyspnea 0-4      Resistance Training   Training Prescription Yes    Weight 3 lb    Reps 10-15           Perform Capillary Blood Glucose checks as needed.  Exercise Prescription Changes:  Exercise Prescription Changes    Row Name 09/19/20 1400             Response to Exercise   Blood Pressure (Admit) 110/66       Blood Pressure (Exercise) 154/82       Blood Pressure (Exit) 116/68       Heart Rate (Admit) 91 bpm       Heart Rate (Exercise) 141 bpm       Heart Rate (Exit) 90 bpm       Oxygen Saturation (Admit) 99 %       Oxygen Saturation (Exercise) 98 %       Rating of Perceived Exertion (Exercise) 11       Perceived Dyspnea (Exercise) 2       Symptoms none              Exercise Comments:   Exercise Goals and Review:  Exercise Goals    Row Name 09/19/20 1458             Exercise Goals   Increase Physical Activity Yes       Intervention Provide advice, education, support and counseling about physical activity/exercise needs.;Develop an individualized exercise prescription for aerobic and resistive training based on initial evaluation findings, risk stratification, comorbidities and  participant's personal goals.       Expected Outcomes Short Term: Attend rehab on a regular basis to increase amount of physical activity.;Long Term: Add in home exercise to make exercise part of routine and to increase amount of physical activity.;Long Term: Exercising regularly at least 3-5 days a week.       Increase Strength and Stamina Yes       Intervention Provide advice, education, support and counseling about physical activity/exercise needs.;Develop an individualized exercise prescription for aerobic and resistive training based on initial evaluation findings, risk stratification, comorbidities and participant's personal goals.       Expected Outcomes Short Term: Increase workloads from initial exercise prescription for resistance, speed, and  METs.;Short Term: Perform resistance training exercises routinely during rehab and add in resistance training at home;Long Term: Improve cardiorespiratory fitness, muscular endurance and strength as measured by increased METs and functional capacity (6MWT)       Able to understand and use rate of perceived exertion (RPE) scale Yes       Intervention Provide education and explanation on how to use RPE scale       Expected Outcomes Short Term: Able to use RPE daily in rehab to express subjective intensity level;Long Term:  Able to use RPE to guide intensity level when exercising independently       Able to understand and use Dyspnea scale Yes       Intervention Provide education and explanation on how to use Dyspnea scale       Expected Outcomes Short Term: Able to use Dyspnea scale daily in rehab to express subjective sense of shortness of breath during exertion;Long Term: Able to use Dyspnea scale to guide intensity level when exercising independently       Knowledge and understanding of Target Heart Rate Range (THRR) Yes       Intervention Provide education and explanation of THRR including how the numbers were predicted and where they are located for reference       Expected Outcomes Short Term: Able to state/look up THRR;Short Term: Able to use daily as guideline for intensity in rehab;Long Term: Able to use THRR to govern intensity when exercising independently       Able to check pulse independently Yes       Intervention Provide education and demonstration on how to check pulse in carotid and radial arteries.;Review the importance of being able to check your own pulse for safety during independent exercise       Expected Outcomes Short Term: Able to explain why pulse checking is important during independent exercise;Long Term: Able to check pulse independently and accurately       Understanding of Exercise Prescription Yes       Intervention Provide education, explanation, and written materials  on patient's individual exercise prescription       Expected Outcomes Short Term: Able to explain program exercise prescription;Long Term: Able to explain home exercise prescription to exercise independently              Exercise Goals Re-Evaluation :   Discharge Exercise Prescription (Final Exercise Prescription Changes):  Exercise Prescription Changes - 09/19/20 1400      Response to Exercise   Blood Pressure (Admit) 110/66    Blood Pressure (Exercise) 154/82    Blood Pressure (Exit) 116/68    Heart Rate (Admit) 91 bpm    Heart Rate (Exercise) 141 bpm    Heart Rate (Exit) 90 bpm    Oxygen Saturation (Admit)  99 %    Oxygen Saturation (Exercise) 98 %    Rating of Perceived Exertion (Exercise) 11    Perceived Dyspnea (Exercise) 2    Symptoms none           Nutrition:  Target Goals: Understanding of nutrition guidelines, daily intake of sodium 1500mg , cholesterol 200mg , calories 30% from fat and 7% or less from saturated fats, daily to have 5 or more servings of fruits and vegetables.  Education: All About Nutrition: -Group instruction provided by verbal, written material, interactive activities, discussions, models, and posters to present general guidelines for heart healthy nutrition including fat, fiber, MyPlate, the role of sodium in heart healthy nutrition, utilization of the nutrition label, and utilization of this knowledge for meal planning. Follow up email sent as well. Written material given at graduation.   Biometrics:  Pre Biometrics - 09/19/20 1459      Pre Biometrics   Height 5' 6.5" (1.689 m)    Weight 186 lb (84.4 kg)    BMI (Calculated) 29.57    Single Leg Stand 19.26 seconds            Nutrition Therapy Plan and Nutrition Goals:   Nutrition Assessments:  MEDIFICTS Score Key:  ?70 Need to make dietary changes   40-70 Heart Healthy Diet  ? 40 Therapeutic Level Cholesterol Diet  Flowsheet Row Cardiac Rehab from 09/19/2020 in [facility] Cardiac  and Pulmonary Rehab  Picture Your Plate Total Score on Admission 44     Picture Your Plate Scores:  [id number] Unhealthy dietary pattern with much room for improvement.  41-50 Dietary pattern unlikely to meet recommendations for good health and room for improvement.  51-60 More healthful dietary pattern, with some room for improvement.   >60 Healthy dietary pattern, although there may be some specific behaviors that could be improved.    Nutrition Goals Re-Evaluation:   Nutrition Goals Discharge (Final Nutrition Goals Re-Evaluation):   Psychosocial: Target Goals: Acknowledge presence or absence of significant depression and/or stress, maximize coping skills, provide positive support system. Participant is able to verbalize types and ability to use techniques and skills needed for reducing stress and depression.   Education: Stress, Anxiety, and Depression - Group verbal and visual presentation to define topics covered.  Reviews how body is impacted by stress, anxiety, and depression.  Also discusses healthy ways to reduce stress and to treat/manage anxiety and depression.  Written material given at graduation.   Education: Sleep Hygiene -Provides group verbal and written instruction about how sleep can affect your health.  Define sleep hygiene, discuss sleep cycles and impact of sleep habits. Review good sleep hygiene tips.    Initial Review & Psychosocial Screening:  Initial Psych Review & Screening - 09/11/20 1145      Initial Review   Current issues with Current Anxiety/Panic;Current Stress Concerns    Source of Stress Concerns Chronic Illness    Comments Stress over what her surgery and her recovery will do to her Diabetes, getting back to work safely and understanding all that happened.      Family Dynamics   Good Support System? Yes   daughter, brothers.     Barriers   Psychosocial barriers to participate in program There are no identifiable barriers or psychosocial needs.       Screening Interventions   Interventions Encouraged to exercise;To provide support and resources with identified psychosocial needs    Expected Outcomes Short Term goal: Utilizing psychosocial counselor, staff and physician to assist with identification of  specific Stressors or current issues interfering with healing process. Setting desired goal for each stressor or current issue identified.;Long Term Goal: Stressors or current issues are controlled or eliminated.;Short Term goal: Identification and review with participant of any Quality of Life or Depression concerns found by scoring the questionnaire.;Long Term goal: The participant improves quality of Life and PHQ9 Scores as seen by post scores and/or verbalization of changes           Quality of Life Scores:   Quality of Life - 09/19/20 1500      Quality of Life   Select Quality of Life      Quality of Life Scores   Health/Function Pre 21.8 %    Socioeconomic Pre 17.44 %    Psych/Spiritual Pre 24 %    Family Pre 25.8 %    GLOBAL Pre 21.8 %          Scores of 19 and below usually indicate a poorer quality of life in these areas.  A difference of  2-3 points is a clinically meaningful difference.  A difference of 2-3 points in the total score of the Quality of Life Index has been associated with significant improvement in overall quality of life, self-image, physical symptoms, and general health in studies assessing change in quality of life.  PHQ-9: Recent Review Flowsheet Data    Depression screen [facility] 2/9 09/19/2020   Decreased Interest 1   Down, Depressed, Hopeless 1   PHQ - 2 Score 2   Altered sleeping 1   Tired, decreased energy 1   Change in appetite 1   Feeling bad or failure about yourself  0   Trouble concentrating 0   Moving slowly or fidgety/restless 0   Suicidal thoughts 0   PHQ-9 Score 5   Difficult doing work/chores Not difficult at all     Interpretation of Total Score  Total Score Depression  Severity:  1-4 = Minimal depression, 5-9 = Mild depression, 10-14 = Moderate depression, 15-19 = Moderately severe depression, 20-27 = Severe depression   Psychosocial Evaluation and Intervention:  Psychosocial Evaluation - 09/11/20 1202      Psychosocial Evaluation & Interventions   Interventions Encouraged to exercise with the program and follow exercise prescription;Relaxation education    Comments Stacey Richardson has no barriers to entry to the program. She has some anxiety about her chest "opening up if she pushes to hard". Spoke to her that we have never seen this happen here as  we will start slow and work until she feels healed before increasing the her workloads.  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 She  has a good support system with her daughter and her 2 brothers. She is ready to get started and learn about her heart disease, learn the exercise and nutriton steps to lead to a healthy lifestyle and continue to work on managing her diabetes. Stacey Richardson should do well in the program.    Expected Outcomes STG: Attends all scheduled sessions:exercise and education. Continues to verbalize any stress or fears   LTG: Stacey Richardson able to utilize what she has learned to maintain a healthy lifestyle and has been able to diminish her stress and fears about her heart disease .    Continue Psychosocial Services  Follow  up required by staff           Psychosocial Re-Evaluation:   Psychosocial Discharge (Final Psychosocial Re-Evaluation):   Vocational Rehabilitation: Provide vocational rehab assistance to qualifying candidates.   Vocational Rehab Evaluation & Intervention:  Vocational Rehab - 09/11/20 1153      Initial Vocational Rehab Evaluation & Intervention   Assessment shows need for Vocational Rehabilitation No            Education: Education Goals: Education classes will be provided on a variety of topics geared toward better understanding of heart health and risk factor modification. Participant will state understanding/return demonstration of topics presented as noted by education test scores.  Learning Barriers/Preferences:  Learning Barriers/Preferences - 09/11/20 1152      Learning Barriers/Preferences   Learning Barriers None    Learning Preferences Video           General Cardiac Education Topics:  AED/CPR: - Group verbal and written instruction with the use of models to demonstrate the basic use of the AED with the basic ABC's of resuscitation.   Anatomy and Cardiac Procedures: - Group verbal and visual presentation and models provide information about basic cardiac anatomy and function. Reviews the testing methods done to diagnose heart disease and the outcomes of the test results. Describes the treatment choices: Medical Management, Angioplasty, or Coronary Bypass Surgery for treating various heart conditions including Myocardial Infarction, Angina, Valve Disease, and Cardiac Arrhythmias.  Written material given at graduation.   Medication Safety: - Group verbal and visual instruction to review commonly prescribed medications for heart and lung disease. Reviews the medication, class of the drug, and side effects. Includes the steps to properly store meds and maintain the prescription regimen.  Written material given at graduation.   Intimacy: - Group verbal instruction through game format to discuss how heart and lung disease can affect sexual intimacy. Written material given at graduation..   Know Your Numbers and Heart Failure: - Group verbal and visual instruction to discuss disease risk factors for cardiac and pulmonary disease and treatment options.  Reviews associated critical values for Overweight/Obesity, Hypertension, Cholesterol, and Diabetes.  Discusses basics of  heart failure: signs/symptoms and treatments.  Introduces Heart Failure Zone chart for action plan for heart failure.  Written material given at graduation.   Infection Prevention: - Provides verbal and written material to individual with discussion of infection control including proper hand washing and proper equipment cleaning during exercise session. Flowsheet Row Cardiac Rehab from 09/19/2020 in [facility] Cardiac and Pulmonary Rehab  Date 09/19/20  Educator AS  Instruction Review Code 1- Verbalizes Understanding      Falls Prevention: - Provides verbal and written material to individual with discussion of falls prevention and safety. Flowsheet Row Cardiac Rehab from 09/19/2020 in [facility] Cardiac and Pulmonary Rehab  Date 09/19/20  Educator AS  Instruction Review Code 1- Verbalizes Understanding      Other: -Provides group and verbal instruction on various topics (see comments)   Knowledge Questionnaire Score:  Knowledge Questionnaire Score - 09/19/20 1501      Knowledge Questionnaire Score   Pre Score 23/26 angina nutrition           Core Components/Risk Factors/Patient Goals at Admission:  Personal Goals and Risk Factors at Admission - 09/19/20 1459      Core Components/Risk Factors/Patient Goals on Admission    Weight Management Weight Loss;Yes    Intervention Weight Management/Obesity: Establish reasonable short term and long term weight goals.;Weight Management: Develop a combined  nutrition and exercise program designed to reach desired caloric intake, while maintaining appropriate intake of nutrient and fiber, sodium and fats, and appropriate energy expenditure required for the weight goal.;Weight Management: Provide education and appropriate resources to help participant work on and attain dietary goals.    Admit Weight 186 lb (84.4 kg)    Goal Weight: Short Term 180 lb (81.6 kg)    Goal Weight: Long Term 160 lb (72.6 kg)    Expected Outcomes Short Term: Continue to assess  and modify interventions until short term weight is achieved;Long Term: Adherence to nutrition and physical activity/exercise program aimed toward attainment of established weight goal;Weight Loss: Understanding of general recommendations for a balanced deficit meal plan, which promotes 1-2 lb weight loss per week and includes a negative energy balance of [phone] kcal/d    Diabetes Yes    Intervention Provide education about signs/symptoms and action to take for hypo/hyperglycemia.;Provide education about proper nutrition, including hydration, and aerobic/resistive exercise prescription along with prescribed medications to achieve blood glucose in normal ranges: Fasting glucose 65-99 mg/dL    Expected Outcomes Short Term: Participant verbalizes understanding of the signs/symptoms and immediate care of hyper/hypoglycemia, proper foot care and importance of medication, aerobic/resistive exercise and nutrition plan for blood glucose control.;Long Term: Attainment of HbA1C < 7%.    Intervention Provide education on lifestyle modifcations including regular physical activity/exercise, weight management, moderate sodium restriction and increased consumption of fresh fruit, vegetables, and low fat dairy, alcohol moderation, and smoking cessation.;Monitor prescription use compliance.    Expected Outcomes Short Term: Continued assessment and intervention until BP is < 140/20mm HG in hypertensive participants. < 130/84mm HG in hypertensive participants with diabetes, heart failure or chronic kidney disease.;Long Term: Maintenance of blood pressure at goal levels.    Lipids Yes    Intervention Provide education and support for participant on nutrition & aerobic/resistive exercise along with prescribed medications to achieve LDL 70mg , HDL >40mg .    Expected Outcomes Short Term: Participant states understanding of desired cholesterol values and is compliant with medications prescribed. Participant is following exercise  prescription and nutrition guidelines.;Long Term: Cholesterol controlled with medications as prescribed, with individualized exercise RX and with personalized nutrition plan. Value goals: LDL < 70mg , HDL > 40 mg.           Education:Diabetes - Individual verbal and written instruction to review signs/symptoms of diabetes, desired ranges of glucose level fasting, after meals and with exercise. Acknowledge that pre and post exercise glucose checks will be done for 3 sessions at entry of program. [place] from 09/19/2020 in [facility] Cardiac and Pulmonary Rehab  Date 09/19/20  Educator AS  Instruction Review Code 1- Verbalizes Understanding      Core Components/Risk Factors/Patient Goals Review:    Core Components/Risk Factors/Patient Goals at Discharge (Final Review):    ITP Comments:  ITP Comments    Row Name 09/11/20 1222 09/19/20 1506         ITP Comments Virtual orientation call completed today. shehas an appointment on Date: 09/13/2020  for EP eval and gym Orientation.  Documentation of diagnosis can be found in [facility] Date: 08/02/2020. Completed 6MWT and gym orientation. Initial ITP created and sent for review to Dr. Emily Filbert, Medical Director.             Comments: initial ITP

## 2020-09-20 ENCOUNTER — Institutional Professional Consult (permissible substitution): Payer: 59 | Admitting: Cardiology

## 2020-09-21 ENCOUNTER — Encounter: Payer: 59 | Admitting: *Deleted

## 2020-09-21 ENCOUNTER — Other Ambulatory Visit: Payer: Self-pay

## 2020-09-21 DIAGNOSIS — Z951 Presence of aortocoronary bypass graft: Secondary | ICD-10-CM

## 2020-09-21 LAB — GLUCOSE, CAPILLARY
Glucose-Capillary: 107 mg/dL — ABNORMAL HIGH (ref 70–99)
Glucose-Capillary: 75 mg/dL (ref 70–99)

## 2020-09-21 NOTE — Progress Notes (Signed)
Daily Session Note  Patient Details  Name: SHAINDY READER MRN: [id number] Date of Birth: [birth year] Referring Provider:   Flowsheet Row Cardiac Rehab from 09/19/2020 in [facility] Cardiac [facility] Rehab  Referring Provider End      Encounter Date: 09/21/2020  Check In:  Session Check In - 09/21/20 [place]      Check-In   Supervising physician immediately available to respond to emergencies See telemetry face sheet for immediately available ER MD    Location [facility]-Cardiac & Pulmonary Rehab    Staff Present Heath Lark, RN, BSN, CCRP;Joseph Hood RCP,RRT,BSRT;Jessica Frederick, [place], [place], [place], CCET    Virtual Visit No    Medication changes reported     No    Fall or balance concerns reported    No    Warm-up and Cool-down Performed on first and last piece of equipment    Resistance Training Performed Yes    VAD Patient? No    PAD/SET Patient? No              Social History   Tobacco Use  Smoking Status Former Smoker  Smokeless Tobacco Never Used  Tobacco Comment   Quit 30 years ago    Goals Met:  Exercise tolerated well Personal goals reviewed No report of cardiac concerns or symptoms  Goals Unmet:  Not Applicable  Comments: First full day of exercise!  Patient was oriented to gym and equipment including functions, settings, policies, and procedures.  Patient's individual exercise prescription and treatment plan were reviewed.  All starting workloads were established based on the results of the 6 minute walk test done at initial orientation visit.  The plan for exercise progression was also introduced and progression will be customized based on patient's performance and goals.    Dr. Emily Filbert is Medical Director for [facility].

## 2020-10-01 ENCOUNTER — Other Ambulatory Visit: Payer: Self-pay | Admitting: Cardiothoracic Surgery

## 2020-10-04 ENCOUNTER — Encounter: Payer: Self-pay | Admitting: Neurology

## 2020-10-04 ENCOUNTER — Ambulatory Visit: Payer: Self-pay | Admitting: Neurology

## 2020-10-04 ENCOUNTER — Encounter: Payer: Self-pay | Admitting: *Deleted

## 2020-10-04 ENCOUNTER — Other Ambulatory Visit: Payer: Self-pay

## 2020-10-04 ENCOUNTER — Other Ambulatory Visit: Payer: Self-pay | Admitting: Cardiothoracic Surgery

## 2020-10-04 DIAGNOSIS — Z951 Presence of aortocoronary bypass graft: Secondary | ICD-10-CM

## 2020-10-04 LAB — GLUCOSE, CAPILLARY
Glucose-Capillary: 99 mg/dL (ref 70–99)
Glucose-Capillary: 131 mg/dL — ABNORMAL HIGH (ref 70–99)

## 2020-10-04 NOTE — Progress Notes (Signed)
Daily Session Note  Patient Details  Name: Stacey Richardson MRN: [id number] Date of Birth: May 30, 1966 Referring Provider:   Flowsheet Row Cardiac Rehab from 09/19/2020 in [facility] Cardiac and Pulmonary Rehab  Referring Provider End      Encounter Date: 10/04/2020  Check In:  Session Check In - 10/04/20 1406      Check-In   Supervising physician immediately available to respond to emergencies See telemetry face sheet for immediately available ER MD    Location [facility]-Cardiac & Pulmonary Rehab    Staff Present Birdie Sons, MPA, Elveria Rising, BA, ACSM CEP, Exercise Physiologist;Kara Eliezer Bottom, MS Exercise Physiologist    Virtual Visit No    Medication changes reported     No    Fall or balance concerns reported    No    Warm-up and Cool-down Performed on first and last piece of equipment    Resistance Training Performed Yes    VAD Patient? No    PAD/SET Patient? No      Pain Assessment   Currently in Pain? No/denies              Social History   Tobacco Use  Smoking Status Former Smoker  Smokeless Tobacco Never Used  Tobacco Comment   Quit 30 years ago    Goals Met:  Independence with exercise equipment Exercise tolerated well No report of cardiac concerns or symptoms Strength training completed today  Goals Unmet:  Not Applicable  Comments: Pt able to follow exercise prescription today without complaint.  Will continue to monitor for progression.    Dr. Emily Filbert is Medical Director for [facility].

## 2020-10-04 NOTE — Progress Notes (Signed)
Cardiac Individual Treatment Plan  Patient Details  Name: Stacey Richardson MRN: [id number] Date of Birth: 1966-08-10 Referring Provider:   Flowsheet Row Cardiac Rehab from 09/19/2020 in [facility] Cardiac and Pulmonary Rehab  Referring Provider End      Initial Encounter Date:  Flowsheet Row Cardiac Rehab from 09/19/2020 in [facility] Cardiac and Pulmonary Rehab  Date 09/19/20      Visit Diagnosis: S/P CABG x 2  Patient's Home Medications on Admission:  Current Outpatient Medications:  .  acetaminophen (TYLENOL) 500 MG tablet, Take 500 mg by mouth every 6 (six) hours as needed for moderate pain or headache., Disp: , Rfl:  .  aspirin EC 81 MG tablet, Take 81 mg by mouth daily. , Disp: , Rfl:  .  atorvastatin (LIPITOR) 80 MG tablet, Take 80 mg by mouth at bedtime., Disp: , Rfl:  .  clopidogrel (PLAVIX) 75 MG tablet, Take 1 tablet (75 mg total) by mouth daily., Disp: 90 tablet, Rfl: 0 .  colchicine 0.6 MG tablet, Take 1 tablet (0.6 mg total) by mouth 2 (two) times daily. (Patient not taking: Reported on 09/11/2020), Disp: 30 tablet, Rfl: 1 .  ferrous sulfate 325 (65 FE) MG tablet, Take 325 mg by mouth 3 (three) times daily with meals.  (Patient not taking: Reported on 09/11/2020), Disp: , Rfl:  .  furosemide (LASIX) 40 MG tablet, Take 1 tablet (40 mg total) by mouth daily. (Patient not taking: Reported on 09/11/2020), Disp: 7 tablet, Rfl: 0 .  guaiFENesin (ROBITUSSIN) 100 MG/5ML liquid, Take 200 mg by mouth 3 (three) times daily as needed for cough., Disp: , Rfl:  .  isosorbide dinitrate (ISORDIL) 10 MG tablet, Take 1 tablet (10 mg total) by mouth 3 (three) times daily. (Patient not taking: Reported on 09/11/2020), Disp: 90 tablet, Rfl: 0 .  LORazepam (ATIVAN) 0.5 MG tablet, Take 1 tablet (0.5 mg total) by mouth every 8 (eight) hours as needed for anxiety., Disp: 30 tablet, Rfl: 0 .  magnesium oxide (MAG-OX) 400 (241.3 Mg) MG tablet, Take 1 tablet (400 mg total) by mouth 2 (two) times daily. (Patient not taking:  Reported on 09/11/2020), Disp: 30 tablet, Rfl: 0 .  magnesium oxide (MAG-OX) 400 MG tablet, Take 1 tablet by mouth 2 (two) times daily. (Patient not taking: Reported on 09/11/2020), Disp: , Rfl:  .  metFORMIN (GLUCOPHAGE-XR) 500 MG 24 hr tablet, Take 1,000 mg by mouth 2 (two) times daily., Disp: , Rfl:  .  metoprolol tartrate (LOPRESSOR) 50 MG tablet, Take 1 tablet (50 mg total) by mouth 2 (two) times daily., Disp: 180 tablet, Rfl: 3 .  Multiple Vitamin (MULTIVITAMIN) tablet, Take 1 tablet by mouth daily., Disp: , Rfl:  .  potassium chloride SA (KLOR-CON) 20 MEQ tablet, Take 1 tablet (20 mEq total) by mouth daily. (Patient not taking: Reported on 09/11/2020), Disp: 15 tablet, Rfl: 0 .  tetrahydrozoline 0.05 % ophthalmic solution, Place 1-2 drops into both eyes 3 (three) times daily as needed (irritated eyes.)., Disp: , Rfl:   Past Medical History: Past Medical History:  Diagnosis Date  . CAD (coronary artery disease)   . CVA (cerebral vascular accident) ([place])   . Diabetes mellitus   . HFrEF (heart failure with reduced ejection fraction) ([place])   . HLD (hyperlipidemia)   . Hypertension     Tobacco Use: Social History   Tobacco Use  Smoking Status Former Smoker  Smokeless Tobacco Never Used  Tobacco Comment   Quit 30 years ago    Labs:  Recent Review Flowsheet Data    Labs for ITP Cardiac and Pulmonary Rehab Latest Ref Rng & Units 08/06/2020 08/06/2020 08/06/2020 08/07/2020 08/08/2020   Cholestrol 0 - 200 mg/dL - - - - -   LDLCALC 0 - 99 mg/dL - - - - -   HDL >40 mg/dL - - - - -   Trlycerides <150 mg/dL - - - - -   Hemoglobin A1c 4.8 - 5.6 % - - - - -   PHART 7.350 - 7.450 7.380 7.328(L) 7.321(L) - -   PCO2ART 32.0 - 48.0 mmHg 37.4 37.0 38.5 - -   HCO3 20.0 - 28.0 mmol/L 22.3 19.4(L) 19.9(L) - -   TCO2 22 - 32 mmol/L 23 20(L) 21(L) - -   ACIDBASEDEF 0.0 - 2.0 mmol/L 3.0(H) 6.0(H) 6.0(H) - -   O2SAT % 100.0 99.0 99.0 74.6 62.1       Exercise Target Goals: Exercise Program  Goal: Individual exercise prescription set using results from initial 6 min walk test and THRR while considering  patient's activity barriers and safety.   Exercise Prescription Goal: Initial exercise prescription builds to 30-45 minutes a day of aerobic activity, 2-3 days per week.  Home exercise guidelines will be given to patient during program as part of exercise prescription that the participant will acknowledge.   Education: Aerobic Exercise: - Group verbal and visual presentation on the components of exercise prescription. Introduces F.I.T.T principle from ACSM for exercise prescriptions.  Reviews F.I.T.T. principles of aerobic exercise including progression. Written material given at graduation.   Education: Resistance Exercise: - Group verbal and visual presentation on the components of exercise prescription. Introduces F.I.T.T principle from ACSM for exercise prescriptions  Reviews F.I.T.T. principles of resistance exercise including progression. Written material given at graduation.    Education: Exercise & Equipment Safety: - Individual verbal instruction and demonstration of equipment use and safety with use of the equipment. Flowsheet Row Cardiac Rehab from 09/19/2020 in [facility] Cardiac and Pulmonary Rehab  Date 09/19/20  Educator AS  Instruction Review Code 1- Verbalizes Understanding      Education: Exercise Physiology & General Exercise Guidelines: - Group verbal and written instruction with models to review the exercise physiology of the cardiovascular system and associated critical values. Provides general exercise guidelines with specific guidelines to those with heart or lung disease.    Education: Flexibility, Balance, Mind/Body Relaxation: - Group verbal and visual presentation with interactive activity on the components of exercise prescription. Introduces F.I.T.T principle from ACSM for exercise prescriptions. Reviews F.I.T.T. principles of flexibility and balance  exercise training including progression. Also discusses the mind body connection.  Reviews various relaxation techniques to help reduce and manage stress (i.e. Deep breathing, progressive muscle relaxation, and visualization). Balance handout provided to take home. Written material given at graduation.   Activity Barriers & Risk Stratification:  Activity Barriers & Cardiac Risk Stratification - 09/11/20 1142      Activity Barriers & Cardiac Risk Stratification   Activity Barriers None    Cardiac Risk Stratification Moderate           6 Minute Walk:  6 Minute Walk    Row Name 09/19/20 1453         6 Minute Walk   Phase Initial     Distance 1365 feet     Walk Time 6 minutes     # of Rest Breaks 0     MPH 2.58     METS 4.5     RPE 11  Perceived Dyspnea  2     VO2 Peak 15.77     Symptoms No     Resting HR 91 bpm     Resting BP 110/66     Resting Oxygen Saturation  99 %     Exercise Oxygen Saturation  during 6 min walk 98 %     Max Ex. HR 141 bpm     Max Ex. BP 154/82     2 Minute Post BP 116/68            Oxygen Initial Assessment:   Oxygen Re-Evaluation:   Oxygen Discharge (Final Oxygen Re-Evaluation):   Initial Exercise Prescription:  Initial Exercise Prescription - 09/19/20 1400      Date of Initial Exercise RX and Referring Provider   Date 09/19/20    Referring Provider End      Treadmill   MPH 2.6    Grade 2.5    Minutes 15    METs 3.9      Recumbant Bike   Level 2    RPM 60    Watts 50    Minutes 15    METs 4      NuStep   Level 3    SPM 80    Minutes 15    METs 4      REL-XR   Level 2    Speed 50    Minutes 15    METs 4      T5 Nustep   Level 2    SPM 80    Minutes 15    METs 4      Prescription Details   Frequency (times per week) 3    Duration Progress to 30 minutes of continuous aerobic without signs/symptoms of physical distress      Intensity   THRR 40-80% of Max Heartrate 121-152    Ratings of Perceived  Exertion 11-13    Perceived Dyspnea 0-4      Resistance Training   Training Prescription Yes    Weight 3 lb    Reps 10-15           Perform Capillary Blood Glucose checks as needed.  Exercise Prescription Changes:  Exercise Prescription Changes    Row Name 09/19/20 1400             Response to Exercise   Blood Pressure (Admit) 110/66       Blood Pressure (Exercise) 154/82       Blood Pressure (Exit) 116/68       Heart Rate (Admit) 91 bpm       Heart Rate (Exercise) 141 bpm       Heart Rate (Exit) 90 bpm       Oxygen Saturation (Admit) 99 %       Oxygen Saturation (Exercise) 98 %       Rating of Perceived Exertion (Exercise) 11       Perceived Dyspnea (Exercise) 2       Symptoms none              Exercise Comments:  Exercise Comments    Row Name 09/21/20 1458           Exercise Comments First full day of exercise!  Patient was oriented to gym and equipment including functions, settings, policies, and procedures.  Patient's individual exercise prescription and treatment plan were reviewed.  All starting workloads were established based on the results of the 6 minute walk test done at initial orientation  visit.  The plan for exercise progression was also introduced and progression will be customized based on patient's performance and goals.              Exercise Goals and Review:  Exercise Goals    Row Name 09/19/20 1458             Exercise Goals   Increase Physical Activity Yes       Intervention Provide advice, education, support and counseling about physical activity/exercise needs.;Develop an individualized exercise prescription for aerobic and resistive training based on initial evaluation findings, risk stratification, comorbidities and participant's personal goals.       Expected Outcomes Short Term: Attend rehab on a regular basis to increase amount of physical activity.;Long Term: Add in home exercise to make exercise part of routine and to  increase amount of physical activity.;Long Term: Exercising regularly at least 3-5 days a week.       Increase Strength and Stamina Yes       Intervention Provide advice, education, support and counseling about physical activity/exercise needs.;Develop an individualized exercise prescription for aerobic and resistive training based on initial evaluation findings, risk stratification, comorbidities and participant's personal goals.       Expected Outcomes Short Term: Increase workloads from initial exercise prescription for resistance, speed, and METs.;Short Term: Perform resistance training exercises routinely during rehab and add in resistance training at home;Long Term: Improve cardiorespiratory fitness, muscular endurance and strength as measured by increased METs and functional capacity (6MWT)       Able to understand and use rate of perceived exertion (RPE) scale Yes       Intervention Provide education and explanation on how to use RPE scale       Expected Outcomes Short Term: Able to use RPE daily in rehab to express subjective intensity level;Long Term:  Able to use RPE to guide intensity level when exercising independently       Able to understand and use Dyspnea scale Yes       Intervention Provide education and explanation on how to use Dyspnea scale       Expected Outcomes Short Term: Able to use Dyspnea scale daily in rehab to express subjective sense of shortness of breath during exertion;Long Term: Able to use Dyspnea scale to guide intensity level when exercising independently       Knowledge and understanding of Target Heart Rate Range (THRR) Yes       Intervention Provide education and explanation of THRR including how the numbers were predicted and where they are located for reference       Expected Outcomes Short Term: Able to state/look up THRR;Short Term: Able to use daily as guideline for intensity in rehab;Long Term: Able to use THRR to govern intensity when exercising  independently       Able to check pulse independently Yes       Intervention Provide education and demonstration on how to check pulse in carotid and radial arteries.;Review the importance of being able to check your own pulse for safety during independent exercise       Expected Outcomes Short Term: Able to explain why pulse checking is important during independent exercise;Long Term: Able to check pulse independently and accurately       Understanding of Exercise Prescription Yes       Intervention Provide education, explanation, and written materials on patient's individual exercise prescription       Expected Outcomes Short Term: Able to explain program  exercise prescription;Long Term: Able to explain home exercise prescription to exercise independently              Exercise Goals Re-Evaluation :  Exercise Goals Re-Evaluation    Row Name 09/21/20 1458             Exercise Goal Re-Evaluation   Exercise Goals Review Able to understand and use rate of perceived exertion (RPE) scale;Able to understand and use Dyspnea scale;Knowledge and understanding of Target Heart Rate Range (THRR);Understanding of Exercise Prescription       Comments Reviewed RPE and dyspnea scales, THR and program prescription with pt today.  Pt voiced understanding and was given a copy of goals to take home.       Expected Outcomes Short: Use RPE daily to regulate intensity. Long: Follow program prescription in THR.              Discharge Exercise Prescription (Final Exercise Prescription Changes):  Exercise Prescription Changes - 09/19/20 1400      Response to Exercise   Blood Pressure (Admit) 110/66    Blood Pressure (Exercise) 154/82    Blood Pressure (Exit) 116/68    Heart Rate (Admit) 91 bpm    Heart Rate (Exercise) 141 bpm    Heart Rate (Exit) 90 bpm    Oxygen Saturation (Admit) 99 %    Oxygen Saturation (Exercise) 98 %    Rating of Perceived Exertion (Exercise) 11    Perceived Dyspnea (Exercise)  2    Symptoms none           Nutrition:  Target Goals: Understanding of nutrition guidelines, daily intake of sodium 1500mg , cholesterol 200mg , calories 30% from fat and 7% or less from saturated fats, daily to have 5 or more servings of fruits and vegetables.  Education: All About Nutrition: -Group instruction provided by verbal, written material, interactive activities, discussions, models, and posters to present general guidelines for heart healthy nutrition including fat, fiber, MyPlate, the role of sodium in heart healthy nutrition, utilization of the nutrition label, and utilization of this knowledge for meal planning. Follow up email sent as well. Written material given at graduation.   Biometrics:  Pre Biometrics - 09/19/20 1459      Pre Biometrics   Height 5' 6.5" (1.689 m)    Weight 186 lb (84.4 kg)    BMI (Calculated) 29.57    Single Leg Stand 19.26 seconds            Nutrition Therapy Plan and Nutrition Goals:   Nutrition Assessments:  MEDIFICTS Score Key:  ?70 Need to make dietary changes   40-70 Heart Healthy Diet  ? 40 Therapeutic Level Cholesterol Diet  Flowsheet Row Cardiac Rehab from 09/19/2020 in [facility] Cardiac and Pulmonary Rehab  Picture Your Plate Total Score on Admission 44     Picture Your Plate Scores:  [id number] Unhealthy dietary pattern with much room for improvement.  41-50 Dietary pattern unlikely to meet recommendations for good health and room for improvement.  51-60 More healthful dietary pattern, with some room for improvement.   >60 Healthy dietary pattern, although there may be some specific behaviors that could be improved.    Nutrition Goals Re-Evaluation:   Nutrition Goals Discharge (Final Nutrition Goals Re-Evaluation):   Psychosocial: Target Goals: Acknowledge presence or absence of significant depression and/or stress, maximize coping skills, provide positive support system. Participant is able to verbalize types and  ability to use techniques and skills needed for reducing stress and depression.   Education:  Stress, Anxiety, and Depression - Group verbal and visual presentation to define topics covered.  Reviews how body is impacted by stress, anxiety, and depression.  Also discusses healthy ways to reduce stress and to treat/manage anxiety and depression.  Written material given at graduation.   Education: Sleep Hygiene -Provides group verbal and written instruction about how sleep can affect your health.  Define sleep hygiene, discuss sleep cycles and impact of sleep habits. Review good sleep hygiene tips.    Initial Review & Psychosocial Screening:  Initial Psych Review & Screening - 09/11/20 1145      Initial Review   Current issues with Current Anxiety/Panic;Current Stress Concerns    Source of Stress Concerns Chronic Illness    Comments Stress over what her surgery and her recovery will do to her Diabetes, getting back to work safely and understanding all that happened.      Family Dynamics   Good Support System? Yes   daughter, brothers.     Barriers   Psychosocial barriers to participate in program There are no identifiable barriers or psychosocial needs.      Screening Interventions   Interventions Encouraged to exercise;To provide support and resources with identified psychosocial needs    Expected Outcomes Short Term goal: Utilizing psychosocial counselor, staff and physician to assist with identification of specific Stressors or current issues interfering with healing process. Setting desired goal for each stressor or current issue identified.;Long Term Goal: Stressors or current issues are controlled or eliminated.;Short Term goal: Identification and review with participant of any Quality of Life or Depression concerns found by scoring the questionnaire.;Long Term goal: The participant improves quality of Life and PHQ9 Scores as seen by post scores and/or verbalization of changes            Quality of Life Scores:   Quality of Life - 09/19/20 1500      Quality of Life   Select Quality of Life      Quality of Life Scores   Health/Function Pre 21.8 %    Socioeconomic Pre 17.44 %    Psych/Spiritual Pre 24 %    Family Pre 25.8 %    GLOBAL Pre 21.8 %          Scores of 19 and below usually indicate a poorer quality of life in these areas.  A difference of  2-3 points is a clinically meaningful difference.  A difference of 2-3 points in the total score of the Quality of Life Index has been associated with significant improvement in overall quality of life, self-image, physical symptoms, and general health in studies assessing change in quality of life.  PHQ-9: Recent Review Flowsheet Data    Depression screen [facility] 2/9 09/19/2020   Decreased Interest 1   Down, Depressed, Hopeless 1   PHQ - 2 Score 2   Altered sleeping 1   Tired, decreased energy 1   Change in appetite 1   Feeling bad or failure about yourself  0   Trouble concentrating 0   Moving slowly or fidgety/restless 0   Suicidal thoughts 0   PHQ-9 Score 5   Difficult doing work/chores Not difficult at all     Interpretation of Total Score  Total Score Depression Severity:  1-4 = Minimal depression, 5-9 = Mild depression, 10-14 = Moderate depression, 15-19 = Moderately severe depression, 20-27 = Severe depression   Psychosocial Evaluation and Intervention:  Psychosocial Evaluation - 09/11/20 1202      Psychosocial Evaluation & Interventions  Interventions Encouraged to exercise with the program and follow exercise prescription;Relaxation education    Comments Lavera has no barriers to entry to the program. She has some anxiety about her chest "opening up if she pushes to hard". Spoke to her that we have never seen this happen here as  we will start slow and work until she feels healed before increasing the her workloads.  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 She  has a good support system with her daughter and her 2 brothers. She is ready to get started and learn about her heart disease, learn the exercise and nutriton steps to lead to a healthy lifestyle and continue to work on managing her diabetes. Clover should do well in the program.    Expected Outcomes STG: Attends all scheduled sessions:exercise and education. Continues to verbalize any stress or fears   LTG: Jamiyah able to utilize what she has learned to maintain a healthy lifestyle and has been able to diminish her stress and fears about her heart disease .    Continue Psychosocial Services  Follow up required by staff           Psychosocial Re-Evaluation:   Psychosocial Discharge (Final Psychosocial Re-Evaluation):   Vocational Rehabilitation: Provide vocational rehab assistance to qualifying candidates.   Vocational Rehab Evaluation & Intervention:  Vocational Rehab - 09/11/20 1153      Initial Vocational Rehab Evaluation & Intervention   Assessment shows need for Vocational Rehabilitation No           Education: Education Goals: Education classes will be provided on a variety of topics geared toward better understanding of heart health and risk factor modification. Participant will state understanding/return demonstration of topics presented as noted by education test scores.  Learning Barriers/Preferences:  Learning Barriers/Preferences - 09/11/20 1152      Learning Barriers/Preferences   Learning Barriers None    Learning Preferences Video           General Cardiac Education Topics:  AED/CPR: - Group verbal and written instruction with the use of models to demonstrate the basic use of the AED with the basic ABC's of resuscitation.   Anatomy and Cardiac Procedures: - Group verbal and visual presentation and  models provide information about basic cardiac anatomy and function. Reviews the testing methods done to diagnose heart disease and the outcomes of the test results. Describes the treatment choices: Medical Management, Angioplasty, or Coronary Bypass Surgery for treating various heart conditions including Myocardial Infarction, Angina, Valve Disease, and Cardiac Arrhythmias.  Written material given at graduation.   Medication Safety: - Group verbal and visual instruction to review commonly prescribed medications for heart and lung disease. Reviews the medication, class of the drug, and side effects. Includes the steps to properly store meds and maintain the prescription regimen.  Written material given at graduation.   Intimacy: - Group verbal instruction through game format to discuss how heart and lung disease can affect sexual intimacy. Written material given at graduation..   Know Your Numbers and Heart Failure: - Group verbal and visual instruction to discuss disease risk factors for cardiac and pulmonary disease and treatment options.  Reviews associated critical values for Overweight/Obesity, Hypertension, Cholesterol, and Diabetes.  Discusses basics of heart failure: signs/symptoms and  treatments.  Introduces Heart Failure Zone chart for action plan for heart failure.  Written material given at graduation.   Infection Prevention: - Provides verbal and written material to individual with discussion of infection control including proper hand washing and proper equipment cleaning during exercise session. Flowsheet Row Cardiac Rehab from 09/19/2020 in [facility] Cardiac and Pulmonary Rehab  Date 09/19/20  Educator AS  Instruction Review Code 1- Verbalizes Understanding      Falls Prevention: - Provides verbal and written material to individual with discussion of falls prevention and safety. Flowsheet Row Cardiac Rehab from 09/19/2020 in [facility] Cardiac and Pulmonary Rehab  Date 09/19/20   Educator AS  Instruction Review Code 1- Verbalizes Understanding      Other: -Provides group and verbal instruction on various topics (see comments)   Knowledge Questionnaire Score:  Knowledge Questionnaire Score - 09/19/20 1501      Knowledge Questionnaire Score   Pre Score 23/26 angina nutrition           Core Components/Risk Factors/Patient Goals at Admission:  Personal Goals and Risk Factors at Admission - 09/19/20 1459      Core Components/Risk Factors/Patient Goals on Admission    Weight Management Weight Loss;Yes    Intervention Weight Management/Obesity: Establish reasonable short term and long term weight goals.;Weight Management: Develop a combined nutrition and exercise program designed to reach desired caloric intake, while maintaining appropriate intake of nutrient and fiber, sodium and fats, and appropriate energy expenditure required for the weight goal.;Weight Management: Provide education and appropriate resources to help participant work on and attain dietary goals.    Admit Weight 186 lb (84.4 kg)    Goal Weight: Short Term 180 lb (81.6 kg)    Goal Weight: Long Term 160 lb (72.6 kg)    Expected Outcomes Short Term: Continue to assess and modify interventions until short term weight is achieved;Long Term: Adherence to nutrition and physical activity/exercise program aimed toward attainment of established weight goal;Weight Loss: Understanding of general recommendations for a balanced deficit meal plan, which promotes 1-2 lb weight loss per week and includes a negative energy balance of [phone] kcal/d    Diabetes Yes    Intervention Provide education about signs/symptoms and action to take for hypo/hyperglycemia.;Provide education about proper nutrition, including hydration, and aerobic/resistive exercise prescription along with prescribed medications to achieve blood glucose in normal ranges: Fasting glucose 65-99 mg/dL    Expected Outcomes Short Term: Participant  verbalizes understanding of the signs/symptoms and immediate care of hyper/hypoglycemia, proper foot care and importance of medication, aerobic/resistive exercise and nutrition plan for blood glucose control.;Long Term: Attainment of HbA1C < 7%.    Intervention Provide education on lifestyle modifcations including regular physical activity/exercise, weight management, moderate sodium restriction and increased consumption of fresh fruit, vegetables, and low fat dairy, alcohol moderation, and smoking cessation.;Monitor prescription use compliance.    Expected Outcomes Short Term: Continued assessment and intervention until BP is < 140/45mm HG in hypertensive participants. < 130/82mm HG in hypertensive participants with diabetes, heart failure or chronic kidney disease.;Long Term: Maintenance of blood pressure at goal levels.    Lipids Yes    Intervention Provide education and support for participant on nutrition & aerobic/resistive exercise along with prescribed medications to achieve LDL 70mg , HDL >40mg .    Expected Outcomes Short Term: Participant states understanding of desired cholesterol values and is compliant with medications prescribed. Participant is following exercise prescription and nutrition guidelines.;Long Term: Cholesterol controlled with medications as prescribed, with individualized exercise RX and with  personalized nutrition plan. Value goals: LDL < 70mg , HDL > 40 mg.           Education:Diabetes - Individual verbal and written instruction to review signs/symptoms of diabetes, desired ranges of glucose level fasting, after meals and with exercise. Acknowledge that pre and post exercise glucose checks will be done for 3 sessions at entry of program. [place] from 09/19/2020 in [facility]  Date 09/19/20  Educator AS  Instruction Review Code 1- Verbalizes Understanding      Core Components/Risk Factors/Patient Goals Review:    Core  Components/Risk Factors/Patient Goals at Discharge (Final Review):    ITP Comments:  ITP Comments    Row Name 09/11/20 1222 09/19/20 1506 09/21/20 1458 10/04/20 0858     ITP Comments Virtual orientation call completed today. shehas an appointment on Date: 09/13/2020  for EP eval and gym Orientation.  Documentation of diagnosis can be found in [facility] Date: 08/02/2020. Completed 6MWT and gym orientation. Initial ITP created and sent for review to Dr. Emily Filbert, Medical Director. First full day of exercise!  Patient was oriented to gym and equipment including functions, settings, policies, and procedures.  Patient's individual exercise prescription and treatment plan were reviewed.  All starting workloads were established based on the results of the 6 minute walk test done at initial orientation visit.  The plan for exercise progression was also introduced and progression will be customized based on patient's performance and goals. 30 Day review completed. Medical Director ITP review done, changes made as directed, and signed approval by Medical Director.  New to program           Comments:

## 2020-10-05 ENCOUNTER — Encounter: Payer: 59 | Admitting: *Deleted

## 2020-10-05 ENCOUNTER — Telehealth: Payer: Self-pay

## 2020-10-05 ENCOUNTER — Telehealth: Payer: Self-pay | Admitting: Internal Medicine

## 2020-10-05 ENCOUNTER — Other Ambulatory Visit: Payer: Self-pay

## 2020-10-05 DIAGNOSIS — Z951 Presence of aortocoronary bypass graft: Secondary | ICD-10-CM

## 2020-10-05 LAB — GLUCOSE, CAPILLARY
Glucose-Capillary: 86 mg/dL (ref 70–99)
Glucose-Capillary: 98 mg/dL (ref 70–99)

## 2020-10-05 NOTE — Telephone Encounter (Signed)
Patient calling to check status of advice from below.

## 2020-10-05 NOTE — Telephone Encounter (Signed)
Patient calling in after seeing Dr. Gurney Maxin, neurologst. Patient was told and aware she had 2 mini strokes. Patient was also told she has a shunt that she was unaware of that may need to be patched or repaired. Patient was unaware of the shunt completely and is very upset and concerned  Please advise patient on what's next for her

## 2020-10-05 NOTE — Telephone Encounter (Signed)
Dr Melrose Nakayama, neurologist, told patient that she had a shunt that is the cause of her strokes. Patient is very concerned and not sure what she needs to do. She is upset she did not know this. Patient says he noted the result in the TEE patient had.  Asked patient if she was going to continue her care at the [place] office. She decided she wants to stay here in [place] with Dr End.  Routing to Dr End for advice about the shunt.

## 2020-10-05 NOTE — Telephone Encounter (Signed)
Patient contacted the office concerned about a possibility of a "shunt" being blocked "needing to be reopened".  She stated that she saw her Neurologist, Dr. Melrose Nakayama from [facility] and he stated that this shunt was causing her strokes.  She stated that she did not know anything about a shunt and was unsure of even where it was located, stated possibly vertebrae.  She stated that she was advised to contact her Cardiologist for follow-up.  She is s/p CABG with Dr. [name] where he clipped atrial appendage 08/06/2020.  Advised that patient should contact Neurology office back in regards to location and also follow-up with Cardiology.  She acknowledged receipt.

## 2020-10-05 NOTE — Progress Notes (Signed)
Daily Session Note  Patient Details  Name: JOYCELIN RADLOFF MRN: [id number] Date of Birth: 09/18/1965 Referring Provider:   Flowsheet Row Cardiac Rehab from 09/19/2020 in [facility]  Referring Provider End      Encounter Date: 10/05/2020  Check In:  Session Check In - 10/05/20 1402      Check-In   Supervising physician immediately available to respond to emergencies See telemetry face sheet for immediately available ER MD    Location [facility]-Cardiac & Pulmonary Rehab    Staff Present Renita Papa, RN BSN;Joseph [address] [place], [place], [place], CCRP, CCET    Virtual Visit No    Medication changes reported     No    Fall or balance concerns reported    No    Warm-up and Cool-down Performed on first and last piece of equipment    Resistance Training Performed Yes    VAD Patient? No    PAD/SET Patient? No      Pain Assessment   Currently in Pain? No/denies              Social History   Tobacco Use  Smoking Status Former Smoker  Smokeless Tobacco Never Used  Tobacco Comment   Quit 30 years ago    Goals Met:  Independence with exercise equipment Exercise tolerated well No report of cardiac concerns or symptoms Strength training completed today  Goals Unmet:  Not Applicable  Comments: Pt able to follow exercise prescription today without complaint.  Will continue to monitor for progression.    Dr. Emily Filbert is Medical Director for [facility].

## 2020-10-06 NOTE — Telephone Encounter (Signed)
Called patient and scheduled her to see Dr End on 10/11/20 at 3:40 pm. Patient was appreciative.

## 2020-10-06 NOTE — Telephone Encounter (Signed)
Please schedule the patient to see me at her convenience to discuss these findings.  [name], MD [facility] [place] [facility]

## 2020-10-07 ENCOUNTER — Other Ambulatory Visit: Payer: Self-pay | Admitting: Cardiothoracic Surgery

## 2020-10-08 ENCOUNTER — Other Ambulatory Visit: Payer: Self-pay

## 2020-10-08 ENCOUNTER — Emergency Department (HOSPITAL_COMMUNITY): Payer: 59

## 2020-10-08 ENCOUNTER — Encounter (HOSPITAL_COMMUNITY): Payer: Self-pay | Admitting: Emergency Medicine

## 2020-10-08 ENCOUNTER — Emergency Department (HOSPITAL_COMMUNITY)
Admission: EM | Admit: 2020-10-08 | Discharge: 2020-10-08 | Disposition: A | Discharge status: Home / Self Care | Payer: 59 | Attending: Emergency Medicine | Admitting: Emergency Medicine

## 2020-10-08 DIAGNOSIS — Z91010 Allergy to peanuts: Secondary | ICD-10-CM | POA: Diagnosis not present

## 2020-10-08 DIAGNOSIS — Z79899 Other long term (current) drug therapy: Secondary | ICD-10-CM | POA: Diagnosis not present

## 2020-10-08 DIAGNOSIS — Z7984 Long term (current) use of oral hypoglycemic drugs: Secondary | ICD-10-CM | POA: Insufficient documentation

## 2020-10-08 DIAGNOSIS — Z87891 Personal history of nicotine dependence: Secondary | ICD-10-CM | POA: Diagnosis not present

## 2020-10-08 DIAGNOSIS — Z8673 Personal history of transient ischemic attack (TIA), and cerebral infarction without residual deficits: Secondary | ICD-10-CM | POA: Insufficient documentation

## 2020-10-08 DIAGNOSIS — R42 Dizziness and giddiness: Secondary | ICD-10-CM | POA: Insufficient documentation

## 2020-10-08 DIAGNOSIS — E119 Type 2 diabetes mellitus without complications: Secondary | ICD-10-CM | POA: Insufficient documentation

## 2020-10-08 DIAGNOSIS — I10 Essential (primary) hypertension: Secondary | ICD-10-CM | POA: Insufficient documentation

## 2020-10-08 DIAGNOSIS — Z7982 Long term (current) use of aspirin: Secondary | ICD-10-CM | POA: Insufficient documentation

## 2020-10-08 DIAGNOSIS — Z7902 Long term (current) use of antithrombotics/antiplatelets: Secondary | ICD-10-CM | POA: Diagnosis not present

## 2020-10-08 DIAGNOSIS — I2510 Atherosclerotic heart disease of native coronary artery without angina pectoris: Secondary | ICD-10-CM | POA: Insufficient documentation

## 2020-10-08 LAB — COMPREHENSIVE METABOLIC PANEL
ALT: 15 U/L (ref 0–44)
AST: 13 U/L — ABNORMAL LOW (ref 15–41)
Albumin: 3.9 g/dL (ref 3.5–5.0)
Alkaline Phosphatase: 56 U/L (ref 38–126)
Anion gap: 8 (ref 5–15)
BUN: 9 mg/dL (ref 6–20)
CO2: 23 mmol/L (ref 22–32)
Calcium: 9.2 mg/dL (ref 8.9–10.3)
Chloride: 105 mmol/L (ref 98–111)
Creatinine, Ser: 0.55 mg/dL (ref 0.44–1.00)
GFR, Estimated: >60 mL/min (ref >60)
Glucose, Bld: 128 mg/dL — ABNORMAL HIGH (ref 70–99)
Potassium: 3.9 mmol/L (ref 3.5–5.1)
Sodium: 136 mmol/L (ref 135–145)
Total Bilirubin: 0.4 mg/dL (ref 0.3–1.2)
Total Protein: 7.3 g/dL (ref 6.5–8.1)

## 2020-10-08 LAB — CBC WITH DIFFERENTIAL/PLATELET
Abs Immature Granulocytes: 0.04 10*3/uL (ref 0.00–0.07)
Basophils Absolute: 0.1 10*3/uL (ref 0.0–0.1)
Basophils Relative: 1 %
Eosinophils Absolute: 0.3 10*3/uL (ref 0.0–0.5)
Eosinophils Relative: 3 %
HCT: 35 % — ABNORMAL LOW (ref 36.0–46.0)
Hemoglobin: 10.3 g/dL — ABNORMAL LOW (ref 12.0–15.0)
Immature Granulocytes: 1 %
Lymphocytes Relative: 20 %
Lymphs Abs: 1.7 10*3/uL (ref 0.7–4.0)
MCH: 23.8 pg — ABNORMAL LOW (ref 26.0–34.0)
MCHC: 29.4 g/dL — ABNORMAL LOW (ref 30.0–36.0)
MCV: 81 fL (ref 80.0–100.0)
Monocytes Absolute: 0.6 10*3/uL (ref 0.1–1.0)
Monocytes Relative: 8 %
Neutro Abs: 5.5 10*3/uL (ref 1.7–7.7)
Neutrophils Relative %: 67 %
Platelets: 390 10*3/uL (ref 150–400)
RBC: 4.32 MIL/uL (ref 3.87–5.11)
RDW: 14.8 % (ref 11.5–15.5)
WBC: 8.1 10*3/uL (ref 4.0–10.5)
nRBC: 0 % (ref 0.0–0.2)

## 2020-10-08 LAB — CBG MONITORING, ED: Glucose-Capillary: 153 mg/dL — ABNORMAL HIGH (ref 70–99)

## 2020-10-08 LAB — TROPONIN I (HIGH SENSITIVITY): Troponin I (High Sensitivity): <2 ng/L (ref <18)

## 2020-10-08 MED ORDER — ZOLPIDEM TARTRATE 5 MG PO TABS: 5.0000 mg | ORAL_TABLET | Freq: Every evening | ORAL | 0 refills | Status: DC | PRN

## 2020-10-08 NOTE — Discharge Instructions (Signed)
You may take Ambien, 1 tablet before bed, tonight because you do not have any Ambien you may take 1 dose of Benadryl before bed, 25 mg.  Please see your family doctor for any other refills that you may need  Your testing tonight was unremarkable and reassuring

## 2020-10-08 NOTE — ED Triage Notes (Addendum)
Pt to the ED with dizziness that began to day when she first woke at 0900  Pt woke at 0530 and performed a CBG and it was 67. Pt says it was low bc she had a light meal at dinner. She ate a snack and performed a CBG again and it was 131. She went back to sleep and woke at 0900.

## 2020-10-08 NOTE — ED Provider Notes (Signed)
[facility] EMERGENCY DEPARTMENT Provider Note   CSN: [id number] Arrival date & time: 10/08/20  1449     History Chief Complaint  Patient presents with  . Dizziness    Stacey Richardson is a 55 y.o. female.  HPI   This patient is a 55 year old female, history of coronary disease status post bypass grafting several months ago, she has a heart failure with reduced ejection fraction, she had left main coronary disease.  She is on Plavix, she takes metoprolol, she takes Metformin, she woke up at 4:00 this morning and was feeling lightheaded, she found her blood sugar to be in the 60s, she ate something and it is been in a normal range the rest of the day but she continues to feel lightheaded.  She also reports that she is currently on her menstrual cycle and bleeding heavily, very heavily.  This is unusual for her.  At this time the patient feels ongoing mild lightheadedness, no vertigo, no headache, no slurred speech, no facial droop, no visual changes.  She has no chest pain coughing or shortness of breath, no fevers or chills, no nausea vomiting or diarrhea and has had a normal appetite  Past Medical History:  Diagnosis Date  . CAD (coronary artery disease)   . CVA (cerebral vascular accident) ([place])   . Diabetes mellitus   . HFrEF (heart failure with reduced ejection fraction) ([place])   . HLD (hyperlipidemia)   . Hypertension     Patient Active Problem List   Diagnosis Date Noted  . Left main coronary artery disease 08/02/2020  . Chronic HFrEF (heart failure with reduced ejection fraction) ([place]) 07/26/2020  . Lightheadedness 07/26/2020  . HFrEF (heart failure with reduced ejection fraction) ([place]) 06/20/2020  . Cardiomyopathy ([place]) 06/20/2020  . Hyperlipidemia LDL goal <70 06/20/2020  . Hyperglycemia   . Cryptogenic stroke ([place])   . TIA (transient ischemic attack) 06/12/2020  . Multifocal pneumonia 01/22/2019  . Type 2 diabetes mellitus without complication ([place])   . Essential  hypertension     Past Surgical History:  Procedure Laterality Date  . CLIPPING OF ATRIAL APPENDAGE N/A 08/06/2020   Procedure: CLIPPING OF ATRIAL APPENDAGE USING ATRICURE CLIP SIZE 40MM;  Surgeon: Wonda Olds, MD;  Location: [place];  Service: Open Heart Surgery;  Laterality: N/A;  . CORONARY ARTERY BYPASS GRAFT N/A 08/06/2020   Procedure: CORONARY ARTERY BYPASS GRAFTING (CABG) X2, USING BILATERAL INTERNAL MAMMARY ARTERIES;  Surgeon: Wonda Olds, MD;  Location: [place];  Service: Open Heart Surgery;  Laterality: N/A;  . KNEE SURGERY    . RIGHT/LEFT HEART CATH AND CORONARY ANGIOGRAPHY Bilateral 08/02/2020   Procedure: RIGHT/LEFT HEART CATH AND CORONARY ANGIOGRAPHY;  Surgeon: [name], MD;  Location: [place] CV LAB;  Service: Cardiovascular;  Laterality: Bilateral;  . TEE WITHOUT CARDIOVERSION N/A 07/18/2020   Procedure: TRANSESOPHAGEAL ECHOCARDIOGRAM (TEE);  Surgeon: [name], MD;  Location: [facility];  Service: Cardiovascular;  Laterality: N/A;  . TEE WITHOUT CARDIOVERSION N/A 08/06/2020   Procedure: TRANSESOPHAGEAL ECHOCARDIOGRAM (TEE);  Surgeon: Wonda Olds, MD;  Location: [place];  Service: Open Heart Surgery;  Laterality: N/A;     OB History   No obstetric history on file.     Family History  Problem Relation Age of Onset  . Heart disease Mother        a. ?valve    Social History   Tobacco Use  . Smoking status: Former Research scientist (life sciences)  . Smokeless tobacco: Never Used  . Tobacco  comment: Quit 30 years ago  Vaping Use  . Vaping Use: Never used  Substance Use Topics  . Alcohol use: Yes    Comment: social  . Drug use: No    Home Medications Prior to Admission medications   Medication Sig Start Date End Date Taking? Authorizing Provider  zolpidem (AMBIEN) 5 MG tablet Take 1 tablet (5 mg total) by mouth at bedtime as needed for up to 14 days for sleep. 10/08/20 10/22/20 Yes Noemi Chapel, MD  acetaminophen (TYLENOL) 500 MG tablet Take 500 mg by mouth  every 6 (six) hours as needed for moderate pain or headache.    [provider]  aspirin EC 81 MG tablet Take 81 mg by mouth daily.     [provider]  atorvastatin (LIPITOR) 80 MG tablet Take 80 mg by mouth at bedtime. 07/14/20   [provider]  clopidogrel (PLAVIX) 75 MG tablet Take 1 tablet (75 mg total) by mouth daily. 06/14/20   Lorella Nimrod, MD  colchicine 0.6 MG tablet Take 1 tablet (0.6 mg total) by mouth 2 (two) times daily. Patient not taking: Reported on 09/11/2020 08/11/20   Elgie Collard, PA-C  ferrous sulfate 325 (65 FE) MG tablet Take 325 mg by mouth 3 (three) times daily with meals.  Patient not taking: Reported on 09/11/2020    [provider]  furosemide (LASIX) 40 MG tablet Take 1 tablet (40 mg total) by mouth daily. Patient not taking: Reported on 09/11/2020 08/11/20   Elgie Collard, PA-C  guaiFENesin (ROBITUSSIN) 100 MG/5ML liquid Take 200 mg by mouth 3 (three) times daily as needed for cough.    [provider]  isosorbide dinitrate (ISORDIL) 10 MG tablet Take 1 tablet (10 mg total) by mouth 3 (three) times daily. Patient not taking: Reported on 09/11/2020 08/11/20   Elgie Collard, PA-C  LORazepam (ATIVAN) 0.5 MG tablet Take 1 tablet (0.5 mg total) by mouth every 8 (eight) hours as needed for anxiety. 08/21/20 08/21/21  Wonda Olds, MD  magnesium oxide (MAG-OX) 400 (241.3 Mg) MG tablet Take 1 tablet (400 mg total) by mouth 2 (two) times daily. Patient not taking: Reported on 09/11/2020 08/11/20   Elgie Collard, PA-C  magnesium oxide (MAG-OX) 400 MG tablet Take 1 tablet by mouth 2 (two) times daily. Patient not taking: Reported on 09/11/2020 08/11/20   [provider]  metFORMIN (GLUCOPHAGE-XR) 500 MG 24 hr tablet Take 1,000 mg by mouth 2 (two) times daily. 06/08/20   [provider]  metoprolol tartrate (LOPRESSOR) 50 MG tablet Take 1 tablet (50 mg total) by mouth 2 (two) times daily. 08/17/20 11/15/20  Verta Ellen.,  NP  Multiple Vitamin (MULTIVITAMIN) tablet Take 1 tablet by mouth daily.    [provider]  potassium chloride SA (KLOR-CON) 20 MEQ tablet Take 1 tablet (20 mEq total) by mouth daily. Patient not taking: Reported on 09/11/2020 08/11/20   Elgie Collard, PA-C  tetrahydrozoline 0.05 % ophthalmic solution Place 1-2 drops into both eyes 3 (three) times daily as needed (irritated eyes.).    [provider]    Allergies    Peanut butter flavor and Lisinopril  Review of Systems   Review of Systems  All other systems reviewed and are negative.   Physical Exam Updated Vital Signs BP (!) 143/90   Pulse 97   Temp 98.1 F (36.7 C) (Oral)   Resp 17   Ht 1.676 m (5\' 6" )   Wt 85.3  kg   LMP 08/22/2020 (Exact Date)   SpO2 100%   BMI 30.34 kg/m   Physical Exam Vitals and nursing note reviewed.  Constitutional:      General: She is not in acute distress.    Appearance: She is well-developed and well-nourished.  HENT:     Head: Normocephalic and atraumatic.     Mouth/Throat:     Mouth: Oropharynx is clear and moist.     Pharynx: No oropharyngeal exudate.  Eyes:     General: No scleral icterus.       Right eye: No discharge.        Left eye: No discharge.     Extraocular Movements: EOM normal.     Conjunctiva/sclera: Conjunctivae normal.     Pupils: Pupils are equal, round, and reactive to light.  Neck:     Thyroid: No thyromegaly.     Vascular: No JVD.  Cardiovascular:     Rate and Rhythm: Normal rate and regular rhythm.     Pulses: Intact distal pulses.     Heart sounds: Normal heart sounds. No murmur heard. No friction rub. No gallop.      Comments: CABG scar well-healed midline Pulmonary:     Effort: Pulmonary effort is normal. No respiratory distress.     Breath sounds: Normal breath sounds. No wheezing or rales.  Abdominal:     General: Bowel sounds are normal. There is no distension.     Palpations: Abdomen is soft. There is no mass.     Tenderness:  There is no abdominal tenderness.  Musculoskeletal:        General: No tenderness or edema. Normal range of motion.     Cervical back: Normal range of motion and neck supple.  Lymphadenopathy:     Cervical: No cervical adenopathy.  Skin:    General: Skin is warm and dry.     Findings: No erythema or rash.  Neurological:     Mental Status: She is alert.     Coordination: Coordination normal.     Comments: Speech is clear, cranial nerves III through XII are intact, memory is intact, strength is normal in all 4 extremities including grips, sensation is intact to light touch and pinprick in all 4 extremities. Coordination as tested by finger-nose-finger is normal, no limb ataxia. Normal gait, normal reflexes at the patellar tendons bilaterally  Psychiatric:        Mood and Affect: Mood and affect normal.        Behavior: Behavior normal.     ED Results / Procedures / Treatments   Labs (all labs ordered are listed, but only abnormal results are displayed) Labs Reviewed  CBC WITH DIFFERENTIAL/PLATELET - Abnormal; Notable for the following components:      Result Value   Hemoglobin 10.3 (*)    HCT 35.0 (*)    MCH 23.8 (*)    MCHC 29.4 (*)    All other components within normal limits  COMPREHENSIVE METABOLIC PANEL - Abnormal; Notable for the following components:   Glucose, Bld 128 (*)    AST 13 (*)    All other components within normal limits  CBG MONITORING, ED - Abnormal; Notable for the following components:   Glucose-Capillary 153 (*)    All other components within normal limits  TROPONIN I (HIGH SENSITIVITY)    EKG EKG Interpretation  Date/Time:  Sunday October 08 2020 15:21:53 EST Ventricular Rate:  101 PR Interval:  182 QRS Duration: 80 QT Interval:  324  QTC Calculation: 420 R Axis:   26 Text Interpretation: Sinus tachycardia with occasional Premature ventricular complexes Nonspecific T wave abnormality Abnormal ECG Since last tracing rate slower Confirmed by Noemi Chapel [phone]) on 10/08/2020 4:37:59 PM   Radiology CT Head Wo Contrast  Result Date: 10/08/2020 CLINICAL DATA:  Nonspecific dizziness and lightheadedness. EXAM: CT HEAD WITHOUT CONTRAST TECHNIQUE: Contiguous axial images were obtained from the base of the skull through the vertex without intravenous contrast. COMPARISON:  Head CT 07/20/2020, brain MRI 07/12/2020 FINDINGS: Brain: Previous left corona radiata infarct on MRI has no definite CT correlate. No intracranial hemorrhage, mass effect, or midline shift. No hydrocephalus. The basilar cisterns are patent. No evidence of territorial infarct or acute ischemia. No extra-axial or intracranial fluid collection. Vascular: No hyperdense vessel. Skull: No fracture or focal lesion. Sinuses/Orbits: Paranasal sinuses and mastoid air cells are clear. The visualized orbits are unremarkable. Other: None. IMPRESSION: No acute intracranial abnormality. Electronically Signed   By: Keith Rake M.D.   On: 10/08/2020 18:40    Procedures Procedures   Medications Ordered in ED Medications - No data to display  ED Course  I have reviewed the triage vital signs and the nursing notes.  Pertinent labs & imaging results that were available during my care of the patient were reviewed by me and considered in my medical decision making (see chart for details).    MDM Rules/Calculators/A&P                          Slightly pale in the mucous membranes, EKG is unremarkable, the patient is concerned about having a stroke, heart attack or to have diabetic complications.  She has recently reduced her Metformin from 2 pills twice a day to 1 pill twice a day and still feels like her sugar is trending low.  Labs pending  The patient's labs are unremarkable, I discussed these findings with the patient, she now reports that she ran out of her Ativan 2 nights ago so she did not have it last night.  I suspect her symptoms were from some type of benzodiazepine withdrawal.   I will give her 2 weeks of Ambien, she will need to follow-up with her family doctor   Final Clinical Impression(s) / ED Diagnoses Final diagnoses:  Dizziness    Rx / DC Orders ED Discharge Orders         Ordered    zolpidem (AMBIEN) 5 MG tablet  At bedtime PRN        10/08/20 2118           Noemi Chapel, MD 10/08/20 2118

## 2020-10-11 ENCOUNTER — Other Ambulatory Visit: Payer: Self-pay

## 2020-10-11 ENCOUNTER — Encounter: Payer: Self-pay | Admitting: Internal Medicine

## 2020-10-11 ENCOUNTER — Ambulatory Visit (INDEPENDENT_AMBULATORY_CARE_PROVIDER_SITE_OTHER): Payer: 59 | Admitting: Internal Medicine

## 2020-10-11 VITALS — BP 151/89 | HR 108 | Ht 66.0 in | Wt 183.1 lb

## 2020-10-11 DIAGNOSIS — I10 Essential (primary) hypertension: Secondary | ICD-10-CM

## 2020-10-11 DIAGNOSIS — I5022 Chronic systolic (congestive) heart failure: Secondary | ICD-10-CM | POA: Diagnosis not present

## 2020-10-11 DIAGNOSIS — I255 Ischemic cardiomyopathy: Secondary | ICD-10-CM | POA: Insufficient documentation

## 2020-10-11 DIAGNOSIS — E1169 Type 2 diabetes mellitus with other specified complication: Secondary | ICD-10-CM

## 2020-10-11 DIAGNOSIS — I2510 Atherosclerotic heart disease of native coronary artery without angina pectoris: Secondary | ICD-10-CM | POA: Diagnosis not present

## 2020-10-11 DIAGNOSIS — E785 Hyperlipidemia, unspecified: Secondary | ICD-10-CM

## 2020-10-11 MED ORDER — LOSARTAN POTASSIUM 25 MG PO TABS: 25.0000 mg | ORAL_TABLET | Freq: Every day | ORAL | 2 refills | Status: DC

## 2020-10-11 MED ORDER — CARVEDILOL 12.5 MG PO TABS: 12.5000 mg | ORAL_TABLET | Freq: Two times a day (BID) | ORAL | 5 refills | Status: DC

## 2020-10-11 MED ORDER — LOSARTAN POTASSIUM 50 MG PO TABS: 25.0000 mg | ORAL_TABLET | Freq: Every day | ORAL | Status: DC

## 2020-10-11 NOTE — Patient Instructions (Signed)
Medication Instructions:  Your physician has recommended you make the following change in your medication:  1- STOP Metoprolol. 2- START Carvedilol 12.5 mg by mouth two times a day. 3- START Losartan 25 mg (0.5 tablet) by mouth once a day.  *If you need a refill on your cardiac medications before your next appointment, please call your pharmacy*  Lab Work: none If you have labs (blood work) drawn today and your tests are completely normal, you will receive your results only by: Marland Kitchen MyChart Message (if you have MyChart) OR . A paper copy in the mail If you have any lab test that is abnormal or we need to change your treatment, we will call you to review the results.   Testing/Procedures: Your physician has requested that you have an limited echocardiogram in 1 month prior to appointment if possible. Echocardiography is a painless test that uses sound waves to create images of your heart. It provides your doctor with information about the size and shape of your heart and how well your heart's chambers and valves are working. This procedure takes approximately one hour. There are no restrictions for this procedure. There is a possibility that an IV may need to be started during your test to inject an image enhancing agent. This is done to obtain more optimal pictures of your heart. Therefore we ask that you do at least drink some water prior to coming in to hydrate your veins.   Follow-Up: At [facility], you and your health needs are our priority.  As part of our continuing mission to provide you with exceptional heart care, we have created designated Provider Care Teams.  These Care Teams include your primary Cardiologist (physician) and Advanced Practice Providers (APPs -  Physician Assistants and Nurse Practitioners) who all work together to provide you with the care you need, when you need it.  We recommend signing up for the patient portal called "MyChart".  Sign up information is provided  on this After Visit Summary.  MyChart is used to connect with patients for Virtual Visits (Telemedicine).  Patients are able to view lab/test results, encounter notes, upcoming appointments, etc.  Non-urgent messages can be sent to your provider as well.   To learn more about what you can do with MyChart, go to [email or web address].    Your next appointment:   1 month(s) with limited echo prior to if possible.   The format for your next appointment:   In Person  Provider:   You may see [name], MD or one of the following Advanced Practice Providers on your designated Care Team:    Murray Hodgkins, NP  Christell Faith, PA-C  Marrianne Mood, PA-C  Cadence Herrin, [place]  Laurann [place], NP

## 2020-10-11 NOTE — Progress Notes (Signed)
Follow-up Outpatient Visit Date: 10/11/2020  Primary Care Provider: Theotis Burrow, MD [address] [place] [address]  Chief Complaint: Follow-up coronary artery disease, heart failure, and stroke  HPI:  Stacey Richardson is a 55 y.o. female with history of coronary artery disease with critical left main stenosis status post CABG (LIMA to LAD and RIMA to OM1), chronic HFrEF due to ischemic cardiomyopathy, strokes, hypertension, hyperlipidemia, type 2 diabetes mellitus, and obesity, who presents for follow-up of CAD, cardiomyopathy, and strokes. I last saw her in mid November. She subsequently underwent catheterization that showed severe left main disease leading to CABG with Dr. [name].  She presented to the [facility] emergency department this past weekend complaining of lightheadedness.  Today, Stacey Richardson reports that she is anxious about concern for shunting in her heart.  She continues to worry that intermittent lightheadedness is manifestation of recurrent strokes.  She has also been experiencing some low blood sugars despite de-escalation of her Metformin by Dr. Alene Mires.  She otherwise has not had any new focal neurologic symptoms.  She is participating in cardiac rehab.  From a heart standpoint, Stacey Richardson is doing well with improving soreness around her median sternotomy incision.  She has not had any anginal chest pain nor shortness of breath or palpitations.  She notes that her heart rate still tends to run a little bit high.  Blood pressure is typically better controlled when she is at cardiac rehab with systolic readings between 110 and 130 mmHg.  She notes that losartan was stopped at 1 point (she is not exactly sure why) but wonders about restarting it.  --------------------------------------------------------------------------------------------------  Past Medical History:  Diagnosis Date  . CAD (coronary artery disease)   . CVA (cerebral vascular accident) ([place])   .  Diabetes mellitus   . HFrEF (heart failure with reduced ejection fraction) ([place])   . HLD (hyperlipidemia)   . Hypertension    Past Surgical History:  Procedure Laterality Date  . CLIPPING OF ATRIAL APPENDAGE N/A 08/06/2020   Procedure: CLIPPING OF ATRIAL APPENDAGE USING ATRICURE CLIP SIZE 40MM;  Surgeon: Wonda Olds, MD;  Location: [place];  Service: Open Heart Surgery;  Laterality: N/A;  . CORONARY ARTERY BYPASS GRAFT N/A 08/06/2020   Procedure: CORONARY ARTERY BYPASS GRAFTING (CABG) X2, USING BILATERAL INTERNAL MAMMARY ARTERIES;  Surgeon: Wonda Olds, MD;  Location: [place];  Service: Open Heart Surgery;  Laterality: N/A;  . KNEE SURGERY    . RIGHT/LEFT HEART CATH AND CORONARY ANGIOGRAPHY Bilateral 08/02/2020   Procedure: RIGHT/LEFT HEART CATH AND CORONARY ANGIOGRAPHY;  Surgeon: [name], MD;  Location: [place] [facility];  Service: Cardiovascular;  Laterality: Bilateral;  . TEE WITHOUT CARDIOVERSION N/A 07/18/2020   Procedure: TRANSESOPHAGEAL ECHOCARDIOGRAM (TEE);  Surgeon: [name], MD;  Location: [facility];  Service: Cardiovascular;  Laterality: N/A;  . TEE WITHOUT CARDIOVERSION N/A 08/06/2020   Procedure: TRANSESOPHAGEAL ECHOCARDIOGRAM (TEE);  Surgeon: Wonda Olds, MD;  Location: [place];  Service: Open Heart Surgery;  Laterality: N/A;    Current Meds  Medication Sig  . acetaminophen (TYLENOL) 500 MG tablet Take 500 mg by mouth every 6 (six) hours as needed for moderate pain or headache.  Marland Kitchen aspirin EC 81 MG tablet Take 81 mg by mouth daily.   Marland Kitchen atorvastatin (LIPITOR) 80 MG tablet Take 80 mg by mouth at bedtime.  . clopidogrel (PLAVIX) 75 MG tablet Take 1 tablet (75 mg total) by mouth daily.  Marland Kitchen LORazepam (ATIVAN) 0.5 MG  tablet Take 1 tablet (0.5 mg total) by mouth every 8 (eight) hours as needed for anxiety.  . metFORMIN (GLUCOPHAGE-XR) 500 MG 24 hr tablet Take 1,000 mg by mouth 2 (two) times daily.  . metoprolol tartrate (LOPRESSOR) 50 MG tablet Take 1  tablet (50 mg total) by mouth 2 (two) times daily.  Marland Kitchen tetrahydrozoline 0.05 % ophthalmic solution Place 1-2 drops into both eyes 3 (three) times daily as needed (irritated eyes.).  Marland Kitchen zolpidem (AMBIEN) 5 MG tablet Take 1 tablet (5 mg total) by mouth at bedtime as needed for up to 14 days for sleep.    Allergies: Peanut butter flavor and Lisinopril  Social History   Tobacco Use  . Smoking status: Former Research scientist (life sciences)  . Smokeless tobacco: Never Used  . Tobacco comment: Quit 30 years ago  Vaping Use  . Vaping Use: Never used  Substance Use Topics  . Alcohol use: Yes    Comment: social  . Drug use: No    Family History  Problem Relation Age of Onset  . Heart disease Mother        a. ?valve    Review of Systems: A 12-system review of systems was performed and was negative except as noted in the HPI.  --------------------------------------------------------------------------------------------------  Physical Exam: BP (!) 151/89 (BP Location: Left Arm, Patient Position: Sitting, Cuff Size: Normal)   Pulse (!) 108   Ht 5\' 6"  (1.676 m)   Wt 183 lb 2 oz (83.1 kg)   SpO2 98%   BMI 29.56 kg/m   General:  NAD. Neck: No JVD or HJR. Lungs: Clear to auscultation bilaterally without wheezes or crackles. Heart: Tachycardic but regular without murmurs, rubs, or gallops.  Median sternotomy incision is well-healed. Abdomen: Soft, nontender, nondistended. Extremities: No lower extremity edema.  EKG: Sinus tachycardia with borderline left atrial enlargement and nonspecific T wave changes.  Compared with prior tracing from 10/08/2020, PVCs are no longer present.  Otherwise, there is no significant change.  Lab Results  Component Value Date   WBC 8.1 10/08/2020   HGB 10.3 (L) 10/08/2020   HCT 35.0 (L) 10/08/2020   MCV 81.0 10/08/2020   PLT 390 10/08/2020    Lab Results  Component Value Date   NA 136 10/08/2020   K 3.9 10/08/2020   CL 105 10/08/2020   CO2 23 10/08/2020   BUN 9  10/08/2020   CREATININE 0.55 10/08/2020   GLUCOSE 128 (H) 10/08/2020   ALT 15 10/08/2020    Lab Results  Component Value Date   CHOL 104 08/03/2020   HDL 32 (L) 08/03/2020   LDLCALC 60 08/03/2020   TRIG 60 08/03/2020   CHOLHDL 3.3 08/03/2020    --------------------------------------------------------------------------------------------------  ASSESSMENT AND PLAN: Coronary artery disease: Stacey Richardson is improving following her two-vessel CABG in late November without any symptoms to suggest recurrent angina.  We will plan to continue her current medications for secondary prevention.  Chronic HFrEF due to ischemic cardiomyopathy: Stacey Richardson appears euvolemic with stable NYHA class II-III symptoms.  She is participating in cardiac rehab, which I have encouraged her to stick with.  We will have her restart losartan at 25 mg daily.  I will also transition her from metoprolol tartrate to carvedilol 12.5 mg twice daily to optimize her goal-directed medical therapy.  We will plan to obtain a limited echo when she follows up in 1 month to assess response to evidence-based heart failure therapy.  Stroke: Etiology for left frontal strokes this fall remains uncertain.  Patient  has had 2 transesophageal echocardiograms, neither of which have shown definitive right to left shunting.  However, transcranial Dopplers in 08/2020 were positive.  I reviewed her images with Dr. Burt Richardson, who did not feel that findings warranted PFO closure (no clear evidence that PFO exists).  Stacey Richardson missed her prior EP consultation with Dr. Quentin Ore but is now scheduled for next week to discuss utility of ILR placement.  She may ultimately need to be considered for ICD placement if her LVEF does not improve.  Based on MRI reports at the time of her strokes, remains unclear to me if cerebral findings are consistent with an embolic process or not.  Stacey Richardson has followed with Dr. Melrose Nakayama but missed her appointment with Dr. Leonie Man.   She is now scheduled to see Dr. Leonie Man in April; I encouraged her to proceed with this.  In the meantime, aspirin and clopidogrel should be continued as well as high intensity statin therapy.  Hyperlipidemia associated with type 2 diabetes mellitus: Continue atorvastatin 80 mg daily with target LDL less than 70.  Ongoing management of metformin per Dr. Mirian Capuchin.  Addition of an SGLT2 inhibitor should be considered in the setting of chronic HFrEF.  Hypertension: Blood pressure suboptimally controlled today.  As above, we will restart losartan 25 mg daily and also transition from metoprolol to carvedilol.  Follow-up: Return to clinic in 1 month.  [name], MD 10/11/2020 8:38 AM

## 2020-10-12 ENCOUNTER — Telehealth: Payer: Self-pay | Admitting: Internal Medicine

## 2020-10-12 NOTE — Telephone Encounter (Signed)
Received incoming call from patient. States she took her first doses of carvedilol 12.5 mg and losartan 25 mg this morning. She reports that after taking those doses she started to feel dizzy and stated "feels like everything is slowing down." Feels tired and like her breathing is slowing down. He blood pressure is 123/65, HR 87.  She is concerned about going to cardiac rehab and having to drive. Advised her not to drive at this time. She kept asking if she had to take both these medications. I explained this is part of the medical therapy for her HFrEF due to ischemic cardiomyopathy.  Advised I will make Dr End aware and see what changes can be made.

## 2020-10-12 NOTE — Telephone Encounter (Signed)
It is important that she take these medications in order to help her heart get stronger.  It should be noted that these symptoms have been present for several months, off and on, predating yesterday's medication changes.  Stacey Richardson could try cutting the carvedilol pills in half and take 6.25 mg BID to see if that helps with some of her symptoms.  [name], MD [facility]

## 2020-10-12 NOTE — Telephone Encounter (Signed)
Pt c/o medication issue:  1. Name of Medication: losartan and carvedilol   2. How are you currently taking this medication (dosage and times per day)? 25 mg po q d and 12.5 mg po BID  3. Are you having a reaction (difficulty breathing--STAT)? Dizzy after taking   4. What is your medication issue? Patient is not sure she should be taking both she is concerned taking both has caused dizziness and fatigue .  Patient reports current bp is 123/65  HR 87

## 2020-10-12 NOTE — Telephone Encounter (Signed)
No answer. Mailbox full and cannot accept messages at this time. 

## 2020-10-13 NOTE — Telephone Encounter (Signed)
Called patient. Discussed with patient. States the dizziness was not as bad yesterday afternoon. She understands that her symptoms have predated the new medications but is still concerned she is having them. She verbalized understanding of why it is important for her to be on losartan and carvedilol for her heart. She would like to decrease the carvedilol to 6.25 mg two times a day for a week and then gradually increase to 12.5 mg two times a day. She will continue the losartan as well.  She has appointment with PCP on Monday and will plan to discuss disability with him. She will let us know if any further symptoms or concerns arise.

## 2020-10-15 ENCOUNTER — Emergency Department
Admission: EM | Admit: 2020-10-15 | Discharge: 2020-10-15 | Disposition: A | Discharge status: Home / Self Care | Payer: 59 | Attending: Emergency Medicine | Admitting: Emergency Medicine

## 2020-10-15 ENCOUNTER — Other Ambulatory Visit: Payer: Self-pay

## 2020-10-15 DIAGNOSIS — Z7982 Long term (current) use of aspirin: Secondary | ICD-10-CM | POA: Insufficient documentation

## 2020-10-15 DIAGNOSIS — F419 Anxiety disorder, unspecified: Secondary | ICD-10-CM | POA: Diagnosis not present

## 2020-10-15 DIAGNOSIS — Z7984 Long term (current) use of oral hypoglycemic drugs: Secondary | ICD-10-CM | POA: Diagnosis not present

## 2020-10-15 DIAGNOSIS — E1165 Type 2 diabetes mellitus with hyperglycemia: Secondary | ICD-10-CM | POA: Insufficient documentation

## 2020-10-15 DIAGNOSIS — I2510 Atherosclerotic heart disease of native coronary artery without angina pectoris: Secondary | ICD-10-CM | POA: Insufficient documentation

## 2020-10-15 DIAGNOSIS — I509 Heart failure, unspecified: Secondary | ICD-10-CM | POA: Insufficient documentation

## 2020-10-15 DIAGNOSIS — I110 Hypertensive heart disease with heart failure: Secondary | ICD-10-CM | POA: Diagnosis not present

## 2020-10-15 DIAGNOSIS — Z7902 Long term (current) use of antithrombotics/antiplatelets: Secondary | ICD-10-CM | POA: Insufficient documentation

## 2020-10-15 DIAGNOSIS — Z91010 Allergy to peanuts: Secondary | ICD-10-CM | POA: Insufficient documentation

## 2020-10-15 DIAGNOSIS — Z87891 Personal history of nicotine dependence: Secondary | ICD-10-CM | POA: Diagnosis not present

## 2020-10-15 DIAGNOSIS — Z951 Presence of aortocoronary bypass graft: Secondary | ICD-10-CM | POA: Insufficient documentation

## 2020-10-15 DIAGNOSIS — I471 Supraventricular tachycardia: Secondary | ICD-10-CM | POA: Diagnosis not present

## 2020-10-15 DIAGNOSIS — Z79899 Other long term (current) drug therapy: Secondary | ICD-10-CM | POA: Insufficient documentation

## 2020-10-15 DIAGNOSIS — R002 Palpitations: Secondary | ICD-10-CM | POA: Diagnosis present

## 2020-10-15 LAB — CBC WITH DIFFERENTIAL/PLATELET
Abs Immature Granulocytes: 0.02 10*3/uL (ref 0.00–0.07)
Basophils Absolute: 0.1 10*3/uL (ref 0.0–0.1)
Basophils Relative: 1 %
Eosinophils Absolute: 0.4 10*3/uL (ref 0.0–0.5)
Eosinophils Relative: 4 %
HCT: 32.2 % — ABNORMAL LOW (ref 36.0–46.0)
Hemoglobin: 9.8 g/dL — ABNORMAL LOW (ref 12.0–15.0)
Immature Granulocytes: 0 %
Lymphocytes Relative: 26 %
Lymphs Abs: 2.2 10*3/uL (ref 0.7–4.0)
MCH: 24.1 pg — ABNORMAL LOW (ref 26.0–34.0)
MCHC: 30.4 g/dL (ref 30.0–36.0)
MCV: 79.1 fL — ABNORMAL LOW (ref 80.0–100.0)
Monocytes Absolute: 0.7 10*3/uL (ref 0.1–1.0)
Monocytes Relative: 9 %
Neutro Abs: 5.1 10*3/uL (ref 1.7–7.7)
Neutrophils Relative %: 60 %
Platelets: 450 10*3/uL — ABNORMAL HIGH (ref 150–400)
RBC: 4.07 MIL/uL (ref 3.87–5.11)
RDW: 15.1 % (ref 11.5–15.5)
WBC: 8.5 10*3/uL (ref 4.0–10.5)
nRBC: 0 % (ref 0.0–0.2)

## 2020-10-15 LAB — BASIC METABOLIC PANEL
Anion gap: 8 (ref 5–15)
BUN: 9 mg/dL (ref 6–20)
CO2: 20 mmol/L — ABNORMAL LOW (ref 22–32)
Calcium: 8.4 mg/dL — ABNORMAL LOW (ref 8.9–10.3)
Chloride: 108 mmol/L (ref 98–111)
Creatinine, Ser: 0.62 mg/dL (ref 0.44–1.00)
GFR, Estimated: >60 mL/min (ref >60)
Glucose, Bld: 151 mg/dL — ABNORMAL HIGH (ref 70–99)
Potassium: 4.1 mmol/L (ref 3.5–5.1)
Sodium: 136 mmol/L (ref 135–145)

## 2020-10-15 NOTE — ED Triage Notes (Signed)
EMS called for palpitations. SVT on monitor, HR 160-170. Gave 6 of adenosine without relief. Gave 12 adenosine and converted to sinus tach. AO x 4.

## 2020-10-15 NOTE — Discharge Instructions (Addendum)
Please seek medical attention for any high fevers, chest pain, shortness of breath, change in behavior, persistent vomiting, bloody stool or any other new or concerning symptoms.  

## 2020-10-15 NOTE — ED Provider Notes (Signed)
[facility] Emergency Department Provider Note   ____________________________________________   I have reviewed the triage vital signs and the nursing notes.   HISTORY  Chief Complaint Palpitations   History limited by: Not Limited   HPI Stacey Richardson is a 55 y.o. female who presents to the emergency department today because of concerns for palpitations.  The patient states that she had gone to the movies with her son earlier this afternoon.  She had a soda while there.  When she got home she checked her sugar and found it was very elevated.  She read online that would be a good idea to exercise that she got on her treadmill.  Shortly thereafter she started feeling like her heart was racing.  She denies any associated chest pain or shortness of breath.  The patient was found to be in SVT by EMS.  She did convert back to a normal sinus rhythm after 12 mg of adenosine.  At the time my exam she no longer has any palpitations.  Records reviewed. Per medical record review patient has a history of CAD, DM, HLD, HTN.  Past Medical History:  Diagnosis Date  . CAD (coronary artery disease)   . CVA (cerebral vascular accident) ([place])   . Diabetes mellitus   . HFrEF (heart failure with reduced ejection fraction) ([place])   . HLD (hyperlipidemia)   . Hypertension     Patient Active Problem List   Diagnosis Date Noted  . Ischemic cardiomyopathy 10/11/2020  . Hyperlipidemia associated with type 2 diabetes mellitus ([place]) 10/11/2020  . CAD in native artery 08/02/2020  . Chronic HFrEF (heart failure with reduced ejection fraction) ([place]) 07/26/2020  . Lightheadedness 07/26/2020  . HFrEF (heart failure with reduced ejection fraction) ([place]) 06/20/2020  . Cardiomyopathy ([place]) 06/20/2020  . Hyperlipidemia LDL goal <70 06/20/2020  . Hyperglycemia   . Cryptogenic stroke ([place])   . TIA (transient ischemic attack) 06/12/2020  . Multifocal pneumonia 01/22/2019  . Type 2  diabetes mellitus without complication ([place])   . Essential hypertension     Past Surgical History:  Procedure Laterality Date  . CLIPPING OF ATRIAL APPENDAGE N/A 08/06/2020   Procedure: CLIPPING OF ATRIAL APPENDAGE USING ATRICURE CLIP SIZE 40MM;  Surgeon: Wonda Olds, MD;  Location: [place];  Service: Open Heart Surgery;  Laterality: N/A;  . CORONARY ARTERY BYPASS GRAFT N/A 08/06/2020   Procedure: CORONARY ARTERY BYPASS GRAFTING (CABG) X2, USING BILATERAL INTERNAL MAMMARY ARTERIES;  Surgeon: Wonda Olds, MD;  Location: [place];  Service: Open Heart Surgery;  Laterality: N/A;  . KNEE SURGERY    . RIGHT/LEFT HEART CATH AND CORONARY ANGIOGRAPHY Bilateral 08/02/2020   Procedure: RIGHT/LEFT HEART CATH AND CORONARY ANGIOGRAPHY;  Surgeon: [name], MD;  Location: [place] [facility];  Service: Cardiovascular;  Laterality: Bilateral;  . TEE WITHOUT CARDIOVERSION N/A 07/18/2020   Procedure: TRANSESOPHAGEAL ECHOCARDIOGRAM (TEE);  Surgeon: [name], MD;  Location: [facility];  Service: Cardiovascular;  Laterality: N/A;  . TEE WITHOUT CARDIOVERSION N/A 08/06/2020   Procedure: TRANSESOPHAGEAL ECHOCARDIOGRAM (TEE);  Surgeon: Wonda Olds, MD;  Location: [place];  Service: Open Heart Surgery;  Laterality: N/A;    Prior to Admission medications   Medication Sig Start Date End Date Taking? Authorizing Provider  acetaminophen (TYLENOL) 500 MG tablet Take 500 mg by mouth every 6 (six) hours as needed for moderate pain or headache.    [provider]  aspirin EC 81 MG tablet Take 81 mg by mouth daily.  [provider]  atorvastatin (LIPITOR) 80 MG tablet Take 80 mg by mouth at bedtime. 07/14/20   [provider]  carvedilol (COREG) 12.5 MG tablet Take 1 tablet (12.5 mg total) by mouth 2 (two) times daily. 10/11/20 01/09/21  End, Harrell Gave, MD  clopidogrel (PLAVIX) 75 MG tablet Take 1 tablet (75 mg total) by mouth daily. 06/14/20   Lorella Nimrod, MD  LORazepam  (ATIVAN) 0.5 MG tablet Take 1 tablet (0.5 mg total) by mouth every 8 (eight) hours as needed for anxiety. 08/21/20 08/21/21  Wonda Olds, MD  losartan (COZAAR) 25 MG tablet Take 1 tablet (25 mg total) by mouth daily. 10/11/20 01/09/21  End, Harrell Gave, MD  metFORMIN (GLUCOPHAGE-XR) 500 MG 24 hr tablet Take 1,000 mg by mouth 2 (two) times daily. 06/08/20   [provider]  tetrahydrozoline 0.05 % ophthalmic solution Place 1-2 drops into both eyes 3 (three) times daily as needed (irritated eyes.).    [provider]  zolpidem (AMBIEN) 5 MG tablet Take 1 tablet (5 mg total) by mouth at bedtime as needed for up to 14 days for sleep. 10/08/20 10/22/20  Noemi Chapel, MD    Allergies Peanut butter flavor and Lisinopril  Family History  Problem Relation Age of Onset  . Heart disease Mother        a. ?valve    Social History Social History   Tobacco Use  . Smoking status: Former Research scientist (life sciences)  . Smokeless tobacco: Never Used  . Tobacco comment: Quit 30 years ago  Vaping Use  . Vaping Use: Never used  Substance Use Topics  . Alcohol use: Yes    Comment: social  . Drug use: No    Review of Systems Constitutional: No fever/chills Eyes: No visual changes. ENT: No sore throat. Cardiovascular: Positive for palpitations.  Denies chest pain. Respiratory: Denies shortness of breath. Gastrointestinal: No abdominal pain.  No nausea, no vomiting.  No diarrhea.   Genitourinary: Negative for dysuria. Musculoskeletal: Negative for back pain. Skin: Negative for rash. Neurological: Negative for headaches, focal weakness or numbness.  ____________________________________________   PHYSICAL EXAM:  VITAL SIGNS: ED Triage Vitals  Enc Vitals Group     BP 10/15/20 2019 (!) 142/69     Pulse Rate 10/15/20 2019 (!) 105     Resp 10/15/20 2019 16     Temp 10/15/20 2019 97.9 F (36.6 C)     Temp Source 10/15/20 2019 Oral     SpO2 10/15/20 2019 100 %     Weight 10/15/20 2019 183 lb (83  kg)     Height 10/15/20 2019 5\' 6"  (1.676 m)     Head Circumference --      Peak Flow --      Pain Score 10/15/20 2018 0   Constitutional: Alert and oriented.  Eyes: Conjunctivae are normal.  ENT      Head: Normocephalic and atraumatic.      Nose: No congestion/rhinnorhea.      Mouth/Throat: Mucous membranes are moist.      Neck: No stridor. Hematological/Lymphatic/Immunilogical: No cervical lymphadenopathy. Cardiovascular: Normal rate, regular rhythm.  No murmurs, rubs, or gallops.  Respiratory: Normal respiratory effort without tachypnea nor retractions. Breath sounds are clear and equal bilaterally. No wheezes/rales/rhonchi. Gastrointestinal: Soft and non tender. No rebound. No guarding.  Genitourinary: Deferred Musculoskeletal: Normal range of motion in all extremities. No lower extremity edema. Neurologic:  Normal speech and language. No gross focal neurologic deficits are appreciated.  Skin:  Skin is warm, dry and  intact. No rash noted. Psychiatric: Anxious, tearful.  ____________________________________________    LABS (pertinent positives/negatives)  BMP wnl except co2 20, glu 151, ca 8.4 CBC wbc 8.5, hgb 9.8, plt 450  ____________________________________________   EKG  I, Nance Pear, attending physician, personally viewed and interpreted this EKG  EKG Time: 2023 Rate: 103 Rhythm: sinus tachycardia Axis: normal Intervals: qtc 486 QRS: narrow ST changes: no st elevation Impression: abnormal ekg  ____________________________________________    RADIOLOGY  None  ____________________________________________   PROCEDURES  Procedures  ____________________________________________   INITIAL IMPRESSION / ASSESSMENT AND PLAN / ED COURSE  Pertinent labs & imaging results that were available during my care of the patient were reviewed by me and considered in my medical decision making (see chart for details).   Patient presented to the emergency  department today after an episode of palpitations and SVT that converted with 12 mg of adenosine by EMS.  By the time my exam patient is no longer having palpitations.  She did appear somewhat anxious and was somewhat tearful about this.  I do think that it is likely combination of the caffeine and exercise that put her into the SVT.  Doubt ACS given lack of chest pain.  EKG here without any concerning ST elevation patient is in a sinus rhythm.  Patient states that she has follow-up appointment scheduled with cardiology next week.  Discussed with the patient try to stay away from caffeine and other SVT triggers.  ____________________________________________   FINAL CLINICAL IMPRESSION(S) / ED DIAGNOSES  Final diagnoses:  SVT (supraventricular tachycardia) ([place])     Note: This dictation was prepared with Dragon dictation. Any transcriptional errors that result from this process are unintentional     Nance Pear, MD 10/15/20 2241

## 2020-10-17 ENCOUNTER — Ambulatory Visit
Admission: RE | Admit: 2020-10-17 | Discharge: 2020-10-17 | Disposition: A | Discharge status: Home / Self Care | Payer: 59 | Source: Ambulatory Visit | Attending: Family Medicine | Admitting: Family Medicine

## 2020-10-17 ENCOUNTER — Other Ambulatory Visit: Payer: Self-pay

## 2020-10-17 DIAGNOSIS — Z1231 Encounter for screening mammogram for malignant neoplasm of breast: Secondary | ICD-10-CM | POA: Diagnosis present

## 2020-10-18 ENCOUNTER — Ambulatory Visit (INDEPENDENT_AMBULATORY_CARE_PROVIDER_SITE_OTHER): Payer: 59 | Admitting: Cardiology

## 2020-10-18 ENCOUNTER — Telehealth: Payer: Self-pay

## 2020-10-18 ENCOUNTER — Encounter: Payer: Self-pay | Admitting: Cardiology

## 2020-10-18 ENCOUNTER — Encounter: Payer: 59 | Attending: Internal Medicine

## 2020-10-18 VITALS — BP 120/70 | HR 101 | Ht 66.0 in | Wt 185.1 lb

## 2020-10-18 DIAGNOSIS — I639 Cerebral infarction, unspecified: Secondary | ICD-10-CM

## 2020-10-18 DIAGNOSIS — Z951 Presence of aortocoronary bypass graft: Secondary | ICD-10-CM | POA: Insufficient documentation

## 2020-10-18 DIAGNOSIS — I10 Essential (primary) hypertension: Secondary | ICD-10-CM

## 2020-10-18 DIAGNOSIS — I471 Supraventricular tachycardia: Secondary | ICD-10-CM

## 2020-10-18 DIAGNOSIS — I5022 Chronic systolic (congestive) heart failure: Secondary | ICD-10-CM

## 2020-10-18 NOTE — Progress Notes (Signed)
Electrophysiology Office Note:    Date:  10/18/2020   ID:  Stacey Richardson, DOB Jan 29, 1966, MRN [id number]  PCP:  Theotis Burrow, MD  [facility] Cardiologist:  [name], MD  [facility] Electrophysiologist:  Vickie Epley, MD   Referring MD: [name], MD   Chief Complaint: Ischemic cardiomyopathy, cryptogenic stroke, SVT  History of Present Illness:    Stacey Richardson is a 55 y.o. female who presents for an evaluation of ischemic cardiomyopathy, cryptogenic stroke, SVT at the request of Dr. Saunders Revel. Their medical history includes coronary artery disease post bypass surgery, cryptogenic stroke, diabetes, hypertension.  Patient has a history of coronary artery disease and underwent successful CABG with left atrial appendage closure/ligation in November 2021.  She was hospitalized in October 2021 with a TIA.  She developed right-sided facial drooping and had dysarthria.  Her symptoms ultimately resolved.  Her MRI showed a 6 mm left frontal lobe infarct.  Her transesophageal echo performed as part of the work-up post stroke showed a moderately decreased left ventricular function and no evidence of PFO.  She subsequently underwent transcranial Dopplers which was positive for shunting.  Past Medical History:  Diagnosis Date  . CAD (coronary artery disease)   . CVA (cerebral vascular accident) ([place])   . Diabetes mellitus   . HFrEF (heart failure with reduced ejection fraction) ([place])   . HLD (hyperlipidemia)   . Hypertension     Past Surgical History:  Procedure Laterality Date  . CLIPPING OF ATRIAL APPENDAGE N/A 08/06/2020   Procedure: CLIPPING OF ATRIAL APPENDAGE USING ATRICURE CLIP SIZE 40MM;  Surgeon: Wonda Olds, MD;  Location: [place];  Service: Open Heart Surgery;  Laterality: N/A;  . CORONARY ARTERY BYPASS GRAFT N/A 08/06/2020   Procedure: CORONARY ARTERY BYPASS GRAFTING (CABG) X2, USING BILATERAL INTERNAL MAMMARY ARTERIES;  Surgeon: Wonda Olds, MD;  Location: [place];  Service: Open Heart Surgery;  Laterality: N/A;  . KNEE SURGERY    . RIGHT/LEFT HEART CATH AND CORONARY ANGIOGRAPHY Bilateral 08/02/2020   Procedure: RIGHT/LEFT HEART CATH AND CORONARY ANGIOGRAPHY;  Surgeon: [name], MD;  Location: [place] CV [facility];  Service: Cardiovascular;  Laterality: Bilateral;  . TEE WITHOUT CARDIOVERSION N/A 07/18/2020   Procedure: TRANSESOPHAGEAL ECHOCARDIOGRAM (TEE);  Surgeon: [name], MD;  Location: [facility];  Service: Cardiovascular;  Laterality: N/A;  . TEE WITHOUT CARDIOVERSION N/A 08/06/2020   Procedure: TRANSESOPHAGEAL ECHOCARDIOGRAM (TEE);  Surgeon: Wonda Olds, MD;  Location: [place];  Service: Open Heart Surgery;  Laterality: N/A;    Current Medications: Current Meds  Medication Sig  . acetaminophen (TYLENOL) 500 MG tablet Take 500 mg by mouth every 6 (six) hours as needed for moderate pain or headache.  Marland Kitchen aspirin EC 81 MG tablet Take 81 mg by mouth daily.   Marland Kitchen atorvastatin (LIPITOR) 80 MG tablet Take 80 mg by mouth at bedtime.  . carvedilol (COREG) 12.5 MG tablet Take 1 tablet (12.5 mg total) by mouth 2 (two) times daily.  . clopidogrel (PLAVIX) 75 MG tablet Take 1 tablet (75 mg total) by mouth daily.  Marland Kitchen LORazepam (ATIVAN) 0.5 MG tablet Take 1 tablet (0.5 mg total) by mouth every 8 (eight) hours as needed for anxiety.  Marland Kitchen losartan (COZAAR) 25 MG tablet Take 1 tablet (25 mg total) by mouth daily.  . metFORMIN (GLUCOPHAGE-XR) 500 MG 24 hr tablet Take 1,000 mg by mouth 2 (two) times daily.  Marland Kitchen tetrahydrozoline 0.05 % ophthalmic solution Place 1-2 drops into both  eyes 3 (three) times daily as needed (irritated eyes.).  Marland Kitchen zolpidem (AMBIEN) 5 MG tablet Take 1 tablet (5 mg total) by mouth at bedtime as needed for up to 14 days for sleep.     Allergies:   Peanut butter flavor and Lisinopril   Social History   Socioeconomic History  . Marital status: Divorced    Spouse name: Not on file  . Number of  children: Not on file  . Years of education: Not on file  . Highest education level: Not on file  Occupational History  . Not on file  Tobacco Use  . Smoking status: Former Research scientist (life sciences)  . Smokeless tobacco: Never Used  . Tobacco comment: Quit 30 years ago  Vaping Use  . Vaping Use: Never used  Substance and Sexual Activity  . Alcohol use: Yes    Comment: social  . Drug use: No  . Sexual activity: Not on file  Other Topics Concern  . Not on file  Social History Narrative  . Not on file   Social Determinants of Health   Financial Resource Strain: Not on file  Food Insecurity: Not on file  Transportation Needs: Not on file  Physical Activity: Not on file  Stress: Not on file  Social Connections: Not on file     Family History: The patient's family history includes Heart disease in her mother.  ROS:   Please see the history of present illness.    All other systems reviewed and are negative.  EKGs/Labs/Other Studies Reviewed:    The following studies were reviewed today:  June 19, 2020 ZIO personally reviewed No atrial fibrillation detected  July 18, 2020 TEE personally reviewed Left ventricular function reduced, 30 to 35% Right ventricular function normal No significant valvular abnormalities Grade 2 plaque involving the descending aorta   EKG:  The ekg ordered today demonstrates normal sinus rhythm  Recent Labs: 06/12/2020: TSH 1.251 07/20/2020: B Natriuretic Peptide 26.5 08/07/2020: Magnesium 2.2 10/08/2020: ALT 15 10/15/2020: BUN 9; Creatinine, Ser 0.62; Hemoglobin 9.8; Platelets 450; Potassium 4.1; Sodium 136  Recent Lipid Panel    Component Value Date/Time   CHOL 104 08/03/2020 0042   TRIG 60 08/03/2020 0042   HDL 32 (L) 08/03/2020 0042   CHOLHDL 3.3 08/03/2020 0042   VLDL 12 08/03/2020 0042   LDLCALC 60 08/03/2020 0042    Physical Exam:    VS:  BP 120/70 (BP Location: Left Arm, Patient Position: Sitting, Cuff Size: Normal)   Pulse (!) 101   Ht  5\' 6"  (1.676 m)   Wt 185 lb 2 oz (84 kg)   LMP 08/22/2020 (Exact Date)   SpO2 99%   BMI 29.88 kg/m     Wt Readings from Last 3 Encounters:  10/18/20 185 lb 2 oz (84 kg)  10/15/20 183 lb (83 kg)  10/11/20 183 lb 2 oz (83.1 kg)     GEN:  Well nourished, well developed in no acute distress.  Tearful HEENT: Normal NECK: No JVD; No carotid bruits LYMPHATICS: No lymphadenopathy CARDIAC: RRR, no murmurs, rubs, gallops RESPIRATORY:  Clear to auscultation without rales, wheezing or rhonchi  ABDOMEN: Soft, non-tender, non-distended MUSCULOSKELETAL:  No edema; No deformity  SKIN: Warm and dry NEUROLOGIC:  Alert and oriented x 3 PSYCHIATRIC:  Normal affect   ASSESSMENT:    1. Chronic HFrEF (heart failure with reduced ejection fraction) ([place])   2. Cryptogenic stroke ([place])   3. S/P CABG x 2   4. Essential hypertension   5. PSVT (  paroxysmal supraventricular tachycardia) ([facility])    PLAN:    In order of problems listed above:  1. Chronic mild systolic and diastolic heart failure Secondary to ischemic cardiomyopathy, coronary artery disease post bypass surgery November 2021. No ischemic symptoms today, NYHA class II symptoms Warm and dry on exam Patient has a repeat echocardiogram scheduled for November 08, 2020 to reassess her left ventricular function.  We did discuss during today's visit the possible need for a defibrillator in the future to help reduce the risk of sudden cardiac death associated with her ischemic cardiomyopathy.  We will plan to touch base in about 8 weeks to readdress this question after her echo.  2.  Cryptogenic stroke Patient with cryptogenic stroke last fall.  Monitoring has not shown any evidence of atrial fibrillation.  We discussed using a loop recorder for ongoing monitoring for atrial fibrillation and the patient wishes to proceed.   Plan to implant this today.  3.  PSVT Patient with a recent episode of narrow complex SVT with a rate of approximate 170 to 180  bpm.  It was highly symptomatic.  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  We discussed EP study and ablation during today's visit.  Understandably, the patient is very overwhelmed given how any issues have come up recently including the stroke, ischemic cardiomyopathy and now the SVT.  For now, plan to continue the carvedilol and readdress her SVT at her follow-up in approximately 8 weeks.  If she has recurrent episodes of SVT, would favor EP study and ablation, especially in light of her reduced left ventricular function.  I discussed EP study and ablation in detail with the patient including the risks, expected recovery time and success rate.   Follow-up 8 weeks.    Medication Adjustments/Labs and Tests Ordered: Current medicines are reviewed at length with the patient today.  Concerns regarding medicines are outlined above.  No orders of the defined types were placed in this encounter.  No orders of the defined types were placed in this encounter.    Signed, Lars Mage, MD, [facility]  10/18/2020 3:00 PM    Electrophysiology [facility]     ---------------------------------------------------------------------  SURGEON:  Lars Mage, MD    PREPROCEDURE DIAGNOSIS: Cryptogenic stroke    POSTPROCEDURE DIAGNOSIS: Cryptogenic stroke     PROCEDURES:   1. Implantable loop recorder implantation    INTRODUCTION: Ms. Skalsky is a 55 y.o. patient with a history of cryptogenic stroke who presents today for implantable loop implantation.      DESCRIPTION OF PROCEDURE:  Informed written consent was obtained.  The patient required no sedation for the procedure today.   The patients left chest was therefore prepped and draped in the usual sterile fashion. The skin overlying the left parasternal region was infiltrated with lidocaine for local analgesia.  A 0.5-cm incision was made over the  left parasternal region over the 3rd intercostal space.  A Medtronic Reveal Linq model [id number]) implantable loop recorder was then placed into the pocket  R waves were very prominent and measured >0.73mV.  Steri- Strips and a sterile dressing were then applied.  There were no early apparent complications.     CONCLUSIONS:   1. Successful implantation of a Medtronic Reveal LINQ implantable loop recorder for cryptogenic stroke.  2. No early apparent complications.   Lysbeth Galas T. Quentin Ore, MD, [facility] Electrophysiology

## 2020-10-18 NOTE — Telephone Encounter (Signed)
Called Stacey Richardson to see how she was doing with adjusting to her medication and if she would be coming to rehab tomorrow. Christiane was at the doctors office today which is why she wasn't in rehab today, she reports she will back tomorrow.

## 2020-10-18 NOTE — Patient Instructions (Addendum)
Medication Instructions:  - Your physician recommends that you continue on your current medications as directed. Please refer to the Current Medication list given to you today.  *If you need a refill on your cardiac medications before your next appointment, please call your pharmacy*   Lab Work: - none ordered  If you have labs (blood work) drawn today and your tests are completely normal, you will receive your results only by: Marland Kitchen MyChart Message (if you have MyChart) OR . A paper copy in the mail If you have any lab test that is abnormal or we need to change your treatment, we will call you to review the results.   Testing/Procedures: - none ordered   Follow-Up: At [facility] you and your health needs are our priority.  As part of our continuing mission to provide you with exceptional heart care, we have created designated Provider Care Teams.  These Care Teams include your primary Cardiologist (physician) and Advanced Practice Providers (APPs -  Physician Assistants and Nurse Practitioners) who all work together to provide you with the care you need, when you need it.  We recommend signing up for the patient portal called "MyChart".  Sign up information is provided on this After Visit Summary.  MyChart is used to connect with patients for Virtual Visits (Telemedicine).  Patients are able to view lab/test results, encounter notes, upcoming appointments, etc.  Non-urgent messages can be sent to your provider as well.   To learn more about what you can do with MyChart, go to [email or web address].    Your next appointment:   6 week(s)  The format for your next appointment:   In Person  Provider:   Lars Mage, MD   Other Instructions  Post procedure instructions: 1) You may shower 72 hours post procedure (Saturday 10/21/20)  2) You may remove your tegaderm (top) dressing 72 hours post procedure (Saturday 10/21/20)  3) You may remove your steri strips on Day 7  (Wednesday 10/25/20) post procedure.  4) If you have any bleeding issues or concerns with you implant site after getting home, please call our [facility] directly at [phone]/ if after hours call the office at [phone] and listen to the prompts to be directed to the physician on call

## 2020-10-19 ENCOUNTER — Telehealth: Payer: Self-pay

## 2020-10-19 ENCOUNTER — Encounter: Payer: Self-pay | Admitting: *Deleted

## 2020-10-19 NOTE — Telephone Encounter (Signed)
LMOM

## 2020-10-23 ENCOUNTER — Other Ambulatory Visit: Payer: Self-pay

## 2020-10-23 DIAGNOSIS — Z951 Presence of aortocoronary bypass graft: Secondary | ICD-10-CM

## 2020-10-23 LAB — GLUCOSE, CAPILLARY
Glucose-Capillary: 103 mg/dL — ABNORMAL HIGH (ref 70–99)
Glucose-Capillary: 93 mg/dL (ref 70–99)

## 2020-10-23 NOTE — Progress Notes (Signed)
Daily Session Note  Patient Details  Name: CAMBRIE SONNENFELD MRN: [id number] Date of Birth: [birth year] Referring Provider:   Flowsheet Row Cardiac Rehab from 09/19/2020 in [facility] Cardiac and Pulmonary Rehab  Referring Provider End      Encounter Date: 10/23/2020  Check In:  Session Check In - 10/23/20 1417      Check-In   Supervising physician immediately available to respond to emergencies See telemetry face sheet for immediately available ER MD    Location [facility]-Cardiac & Pulmonary Rehab    Staff Present Birdie Sons, MPA, Mauricia Area, BS, ACSM CEP, Exercise Physiologist;Kara Eliezer Bottom, MS Exercise Physiologist    Virtual Visit No    Medication changes reported     No    Fall or balance concerns reported    No    Warm-up and Cool-down Performed on first and last piece of equipment    Resistance Training Performed Yes    VAD Patient? No    PAD/SET Patient? No      Pain Assessment   Currently in Pain? No/denies              Social History   Tobacco Use  Smoking Status Former Smoker  Smokeless Tobacco Never Used  Tobacco Comment   Quit 30 years ago    Goals Met:  Independence with exercise equipment Exercise tolerated well No report of cardiac concerns or symptoms Strength training completed today  Goals Unmet:  Not Applicable  Comments: Pt able to follow exercise prescription today without complaint.  Will continue to monitor for progression.    Dr. Emily Filbert is Medical Director for [place] and [facility].

## 2020-10-26 ENCOUNTER — Encounter: Payer: 59 | Admitting: *Deleted

## 2020-10-26 ENCOUNTER — Other Ambulatory Visit: Payer: Self-pay

## 2020-10-26 DIAGNOSIS — Z951 Presence of aortocoronary bypass graft: Secondary | ICD-10-CM | POA: Diagnosis not present

## 2020-10-26 NOTE — Progress Notes (Signed)
Daily Session Note  Patient Details  Name: Stacey Richardson MRN: [id number] Date of Birth: 10-25-1965 Referring Provider:   Flowsheet Row Cardiac Rehab from 09/19/2020 in [facility] Cardiac and Pulmonary Rehab  Referring Provider End      Encounter Date: 10/26/2020  Check In:  Session Check In - 10/26/20 1410      Check-In   Supervising physician immediately available to respond to emergencies See telemetry face sheet for immediately available ER MD    Location [facility]-Cardiac & Pulmonary Rehab    Staff Present Renita Papa, RN BSN;Joseph [address] [place], [place], [place], CCRP, CCET    Virtual Visit No    Medication changes reported     No    Fall or balance concerns reported    No    Warm-up and Cool-down Performed on first and last piece of equipment    Resistance Training Performed Yes    VAD Patient? No    PAD/SET Patient? No      Pain Assessment   Currently in Pain? No/denies              Social History   Tobacco Use  Smoking Status Former Smoker  Smokeless Tobacco Never Used  Tobacco Comment   Quit 30 years ago    Goals Met:  Independence with exercise equipment Exercise tolerated well No report of cardiac concerns or symptoms Strength training completed today  Goals Unmet:  Not Applicable  Comments: Pt able to follow exercise prescription today without complaint.  Will continue to monitor for progression.    Dr. Emily Filbert is Medical Director for [place] and [facility].

## 2020-10-30 ENCOUNTER — Other Ambulatory Visit: Payer: Self-pay

## 2020-10-30 DIAGNOSIS — Z951 Presence of aortocoronary bypass graft: Secondary | ICD-10-CM | POA: Diagnosis not present

## 2020-10-30 NOTE — Progress Notes (Signed)
Daily Session Note  Patient Details  Name: Stacey Richardson MRN: [id number] Date of Birth: 08-Nov-1965 Referring Provider:   Flowsheet Row Cardiac Rehab from 09/19/2020 in [facility] Cardiac and Pulmonary Rehab  Referring Provider End      Encounter Date: 10/30/2020  Check In:  Session Check In - 10/30/20 1406      Check-In   Supervising physician immediately available to respond to emergencies See telemetry face sheet for immediately available ER MD    Location [facility]-Cardiac & Pulmonary Rehab    Staff Present Birdie Sons, MPA, Mauricia Area, BS, ACSM CEP, Exercise Physiologist;Kara Eliezer Bottom, MS Exercise Physiologist;Meredith Sherryll Burger, RN BSN    Virtual Visit No    Medication changes reported     No    Fall or balance concerns reported    No    Warm-up and Cool-down Performed on first and last piece of equipment    Resistance Training Performed Yes    VAD Patient? No    PAD/SET Patient? No      Pain Assessment   Currently in Pain? No/denies              Social History   Tobacco Use  Smoking Status Former Smoker  Smokeless Tobacco Never Used  Tobacco Comment   Quit 30 years ago    Goals Met:  Independence with exercise equipment Exercise tolerated well Personal goals reviewed No report of cardiac concerns or symptoms Strength training completed today  Goals Unmet:  Not Applicable  Comments: Pt able to follow exercise prescription today without complaint.  Will continue to monitor for progression.    Dr. Emily Filbert is Medical Director for [place] and [facility].

## 2020-11-01 ENCOUNTER — Encounter: Payer: Self-pay | Admitting: *Deleted

## 2020-11-01 DIAGNOSIS — Z951 Presence of aortocoronary bypass graft: Secondary | ICD-10-CM

## 2020-11-01 NOTE — Progress Notes (Signed)
Cardiac Individual Treatment Plan  Patient Details  Name: Stacey Richardson MRN: [id number] Date of Birth: 08-Apr-1966 Referring Provider:   Flowsheet Row Cardiac Rehab from 09/19/2020 in [facility] Cardiac and Pulmonary Rehab  Referring Provider End      Initial Encounter Date:  Flowsheet Row Cardiac Rehab from 09/19/2020 in [facility] Cardiac and Pulmonary Rehab  Date 09/19/20      Visit Diagnosis: S/P CABG x 2  Patient's Home Medications on Admission:  Current Outpatient Medications:  .  acetaminophen (TYLENOL) 500 MG tablet, Take 500 mg by mouth every 6 (six) hours as needed for moderate pain or headache., Disp: , Rfl:  .  aspirin EC 81 MG tablet, Take 81 mg by mouth daily. , Disp: , Rfl:  .  atorvastatin (LIPITOR) 80 MG tablet, Take 80 mg by mouth at bedtime., Disp: , Rfl:  .  carvedilol (COREG) 12.5 MG tablet, Take 1 tablet (12.5 mg total) by mouth 2 (two) times daily., Disp: 60 tablet, Rfl: 5 .  clopidogrel (PLAVIX) 75 MG tablet, Take 1 tablet (75 mg total) by mouth daily., Disp: 90 tablet, Rfl: 0 .  LORazepam (ATIVAN) 0.5 MG tablet, Take 1 tablet (0.5 mg total) by mouth every 8 (eight) hours as needed for anxiety., Disp: 30 tablet, Rfl: 0 .  losartan (COZAAR) 25 MG tablet, Take 1 tablet (25 mg total) by mouth daily., Disp: 30 tablet, Rfl: 2 .  metFORMIN (GLUCOPHAGE-XR) 500 MG 24 hr tablet, Take 1,000 mg by mouth 2 (two) times daily., Disp: , Rfl:  .  tetrahydrozoline 0.05 % ophthalmic solution, Place 1-2 drops into both eyes 3 (three) times daily as needed (irritated eyes.)., Disp: , Rfl:  .  zolpidem (AMBIEN) 5 MG tablet, Take 1 tablet (5 mg total) by mouth at bedtime as needed for up to 14 days for sleep., Disp: 14 tablet, Rfl: 0  Past Medical History: Past Medical History:  Diagnosis Date  . CAD (coronary artery disease)   . CVA (cerebral vascular accident) ([place])   . Diabetes mellitus   . HFrEF (heart failure with reduced ejection fraction) ([place])   . HLD (hyperlipidemia)   .  Hypertension     Tobacco Use: Social History   Tobacco Use  Smoking Status Former Smoker  Smokeless Tobacco Never Used  Tobacco Comment   Quit 30 years ago    Labs: Recent Chemical engineer    Labs for ITP Cardiac and Pulmonary Rehab Latest Ref Rng & Units 08/06/2020 08/06/2020 08/06/2020 08/07/2020 08/08/2020   Cholestrol 0 - 200 mg/dL - - - - -   LDLCALC 0 - 99 mg/dL - - - - -   HDL >40 mg/dL - - - - -   Trlycerides <150 mg/dL - - - - -   Hemoglobin A1c 4.8 - 5.6 % - - - - -   PHART 7.350 - 7.450 7.380 7.328(L) 7.321(L) - -   PCO2ART 32.0 - 48.0 mmHg 37.4 37.0 38.5 - -   HCO3 20.0 - 28.0 mmol/L 22.3 19.4(L) 19.9(L) - -   TCO2 22 - 32 mmol/L 23 20(L) 21(L) - -   ACIDBASEDEF 0.0 - 2.0 mmol/L 3.0(H) 6.0(H) 6.0(H) - -   O2SAT % 100.0 99.0 99.0 74.6 62.1       Exercise Target Goals: Exercise Program Goal: Individual exercise prescription set using results from initial 6 min walk test and THRR while considering  patient's activity barriers and safety.   Exercise Prescription Goal: Initial exercise prescription builds to 30-45 minutes a  day of aerobic activity, 2-3 days per week.  Home exercise guidelines will be given to patient during program as part of exercise prescription that the participant will acknowledge.   Education: Aerobic Exercise: - Group verbal and visual presentation on the components of exercise prescription. Introduces F.I.T.T principle from ACSM for exercise prescriptions.  Reviews F.I.T.T. principles of aerobic exercise including progression. Written material given at graduation.   Education: Resistance Exercise: - Group verbal and visual presentation on the components of exercise prescription. Introduces F.I.T.T principle from ACSM for exercise prescriptions  Reviews F.I.T.T. principles of resistance exercise including progression. Written material given at graduation.    Education: Exercise & Equipment Safety: - Individual verbal instruction and  demonstration of equipment use and safety with use of the equipment. Flowsheet Row Cardiac Rehab from 10/04/2020 in [facility] Cardiac and Pulmonary Rehab  Date 09/19/20  Educator AS  Instruction Review Code 1- Verbalizes Understanding      Education: Exercise Physiology & General Exercise Guidelines: - Group verbal and written instruction with models to review the exercise physiology of the cardiovascular system and associated critical values. Provides general exercise guidelines with specific guidelines to those with heart or lung disease.    Education: Flexibility, Balance, Mind/Body Relaxation: - Group verbal and visual presentation with interactive activity on the components of exercise prescription. Introduces F.I.T.T principle from ACSM for exercise prescriptions. Reviews F.I.T.T. principles of flexibility and balance exercise training including progression. Also discusses the mind body connection.  Reviews various relaxation techniques to help reduce and manage stress (i.e. Deep breathing, progressive muscle relaxation, and visualization). Balance handout provided to take home. Written material given at graduation.   Activity Barriers & Risk Stratification:  Activity Barriers & Cardiac Risk Stratification - 09/11/20 1142      Activity Barriers & Cardiac Risk Stratification   Activity Barriers None    Cardiac Risk Stratification Moderate           6 Minute Walk:  6 Minute Walk    Row Name 09/19/20 1453         6 Minute Walk   Phase Initial     Distance 1365 feet     Walk Time 6 minutes     # of Rest Breaks 0     MPH 2.58     METS 4.5     RPE 11     Perceived Dyspnea  2     VO2 Peak 15.77     Symptoms No     Resting HR 91 bpm     Resting BP 110/66     Resting Oxygen Saturation  99 %     Exercise Oxygen Saturation  during 6 min walk 98 %     Max Ex. HR 141 bpm     Max Ex. BP 154/82     2 Minute Post BP 116/68            Oxygen Initial Assessment:   Oxygen  Re-Evaluation:   Oxygen Discharge (Final Oxygen Re-Evaluation):   Initial Exercise Prescription:  Initial Exercise Prescription - 09/19/20 1400      Date of Initial Exercise RX and Referring Provider   Date 09/19/20    Referring Provider End      Treadmill   MPH 2.6    Grade 2.5    Minutes 15    METs 3.9      Recumbant Bike   Level 2    RPM 60    Watts 50    Minutes  15    METs 4      NuStep   Level 3    SPM 80    Minutes 15    METs 4      REL-XR   Level 2    Speed 50    Minutes 15    METs 4      T5 Nustep   Level 2    SPM 80    Minutes 15    METs 4      Prescription Details   Frequency (times per week) 3    Duration Progress to 30 minutes of continuous aerobic without signs/symptoms of physical distress      Intensity   THRR 40-80% of Max Heartrate 121-152    Ratings of Perceived Exertion 11-13    Perceived Dyspnea 0-4      Resistance Training   Training Prescription Yes    Weight 3 lb    Reps 10-15           Perform Capillary Blood Glucose checks as needed.  Exercise Prescription Changes:  Exercise Prescription Changes    Row Name 09/19/20 1400 10/04/20 1500           Response to Exercise   Blood Pressure (Admit) 110/66 122/70      Blood Pressure (Exercise) 154/82 152/66      Blood Pressure (Exit) 116/68 110/66      Heart Rate (Admit) 91 bpm 100 bpm      Heart Rate (Exercise) 141 bpm 134 bpm      Heart Rate (Exit) 90 bpm 109 bpm      Oxygen Saturation (Admit) 99 % --      Oxygen Saturation (Exercise) 98 % --      Rating of Perceived Exertion (Exercise) 11 14      Perceived Dyspnea (Exercise) 2 --      Symptoms none none      Duration -- Continue with 30 min of aerobic exercise without signs/symptoms of physical distress.      Intensity -- THRR unchanged             Progression   Progression -- Continue to progress workloads to maintain intensity without signs/symptoms of physical distress.      Average METs -- 2.85              Resistance Training   Training Prescription -- Yes      Weight -- 3 lb      Reps -- 10-15             Interval Training   Interval Training -- No             Treadmill   MPH -- 2.6      Grade -- 2.5      Minutes -- 15      METs -- 3.89             T5 Nustep   Level -- 2      Minutes -- 15      METs -- 1.8             Exercise Comments:  Exercise Comments    Row Name 09/21/20 1458           Exercise Comments First full day of exercise!  Patient was oriented to gym and equipment including functions, settings, policies, and procedures.  Patient's individual exercise prescription and treatment plan were reviewed.  All starting workloads were established based on the  results of the 6 minute walk test done at initial orientation visit.  The plan for exercise progression was also introduced and progression will be customized based on patient's performance and goals.              Exercise Goals and Review:  Exercise Goals    Row Name 09/19/20 1458             Exercise Goals   Increase Physical Activity Yes       Intervention Provide advice, education, support and counseling about physical activity/exercise needs.;Develop an individualized exercise prescription for aerobic and resistive training based on initial evaluation findings, risk stratification, comorbidities and participant's personal goals.       Expected Outcomes Short Term: Attend rehab on a regular basis to increase amount of physical activity.;Long Term: Add in home exercise to make exercise part of routine and to increase amount of physical activity.;Long Term: Exercising regularly at least 3-5 days a week.       Increase Strength and Stamina Yes       Intervention Provide advice, education, support and counseling about physical activity/exercise needs.;Develop an individualized exercise prescription for aerobic and resistive training based on initial evaluation findings, risk stratification, comorbidities  and participant's personal goals.       Expected Outcomes Short Term: Increase workloads from initial exercise prescription for resistance, speed, and METs.;Short Term: Perform resistance training exercises routinely during rehab and add in resistance training at home;Long Term: Improve cardiorespiratory fitness, muscular endurance and strength as measured by increased METs and functional capacity (6MWT)       Able to understand and use rate of perceived exertion (RPE) scale Yes       Intervention Provide education and explanation on how to use RPE scale       Expected Outcomes Short Term: Able to use RPE daily in rehab to express subjective intensity level;Long Term:  Able to use RPE to guide intensity level when exercising independently       Able to understand and use Dyspnea scale Yes       Intervention Provide education and explanation on how to use Dyspnea scale       Expected Outcomes Short Term: Able to use Dyspnea scale daily in rehab to express subjective sense of shortness of breath during exertion;Long Term: Able to use Dyspnea scale to guide intensity level when exercising independently       Knowledge and understanding of Target Heart Rate Range (THRR) Yes       Intervention Provide education and explanation of THRR including how the numbers were predicted and where they are located for reference       Expected Outcomes Short Term: Able to state/look up THRR;Short Term: Able to use daily as guideline for intensity in rehab;Long Term: Able to use THRR to govern intensity when exercising independently       Able to check pulse independently Yes       Intervention Provide education and demonstration on how to check pulse in carotid and radial arteries.;Review the importance of being able to check your own pulse for safety during independent exercise       Expected Outcomes Short Term: Able to explain why pulse checking is important during independent exercise;Long Term: Able to check pulse  independently and accurately       Understanding of Exercise Prescription Yes       Intervention Provide education, explanation, and written materials on patient's individual exercise prescription  Expected Outcomes Short Term: Able to explain program exercise prescription;Long Term: Able to explain home exercise prescription to exercise independently              Exercise Goals Re-Evaluation :  Exercise Goals Re-Evaluation    Row Name 09/21/20 1458 10/04/20 1526 10/30/20 1409         Exercise Goal Re-Evaluation   Exercise Goals Review Able to understand and use rate of perceived exertion (RPE) scale;Able to understand and use Dyspnea scale;Knowledge and understanding of Target Heart Rate Range (THRR);Understanding of Exercise Prescription Increase Physical Activity;Increase Strength and Stamina;Understanding of Exercise Prescription Increase Physical Activity;Increase Strength and Stamina     Comments Reviewed RPE and dyspnea scales, THR and program prescription with pt today.  Pt voiced understanding and was given a copy of goals to take home. Seraphine is off to a good start to rehab.  She has completed her first two full days of rehab.  We will continue to monitor her progress. Johniya has a TM at home.  Staff have not reviewed home exercise yet.     Expected Outcomes Short: Use RPE daily to regulate intensity. Long: Follow program prescription in THR. Short: Continue to attend regularly Long: Continue to follow program prescription Short:  review home exercise with staff Long:  improve stamina            Discharge Exercise Prescription (Final Exercise Prescription Changes):  Exercise Prescription Changes - 10/04/20 1500      Response to Exercise   Blood Pressure (Admit) 122/70    Blood Pressure (Exercise) 152/66    Blood Pressure (Exit) 110/66    Heart Rate (Admit) 100 bpm    Heart Rate (Exercise) 134 bpm    Heart Rate (Exit) 109 bpm    Rating of Perceived Exertion  (Exercise) 14    Symptoms none    Duration Continue with 30 min of aerobic exercise without signs/symptoms of physical distress.    Intensity THRR unchanged      Progression   Progression Continue to progress workloads to maintain intensity without signs/symptoms of physical distress.    Average METs 2.85      Resistance Training   Training Prescription Yes    Weight 3 lb    Reps 10-15      Interval Training   Interval Training No      Treadmill   MPH 2.6    Grade 2.5    Minutes 15    METs 3.89      T5 Nustep   Level 2    Minutes 15    METs 1.8           Nutrition:  Target Goals: Understanding of nutrition guidelines, daily intake of sodium 1500mg , cholesterol 200mg , calories 30% from fat and 7% or less from saturated fats, daily to have 5 or more servings of fruits and vegetables.  Education: All About Nutrition: -Group instruction provided by verbal, written material, interactive activities, discussions, models, and posters to present general guidelines for heart healthy nutrition including fat, fiber, MyPlate, the role of sodium in heart healthy nutrition, utilization of the nutrition label, and utilization of this knowledge for meal planning. Follow up email sent as well. Written material given at graduation.   Biometrics:  Pre Biometrics - 09/19/20 1459      Pre Biometrics   Height 5' 6.5" (1.689 m)    Weight 186 lb (84.4 kg)    BMI (Calculated) 29.57    Single Leg Stand 19.26  seconds            Nutrition Therapy Plan and Nutrition Goals:  Nutrition Therapy & Goals - 10/30/20 1439      Nutrition Therapy   Diet Heart healhty, low Na, diabetes friendly    Drug/Food Interactions Statins/Certain Fruits    Protein (specify units) 65g    Fiber 25 grams    Whole Grain Foods 3 servings    Saturated Fats 12 max. grams    Fruits and Vegetables 8 servings/day    Sodium 1.5 grams      Personal Nutrition Goals   Nutrition Goal ST: practice counting  carbohydrates, use MyPlate structure and count carbohydrates (3-4 servings for meals, 1-2 for snacks) LT: Be independent managing diabetes    Comments 2 meals - lunch and dinner. Sometimes will have boiled egg and toast (white wheat) - she likes whole wheat bread. L: tuna or cheese toast (whole wheat) D: take out - tries to get baked chicken, mashed poatoes, and sweet potatoes, brussells sprouts. Discussed heart healthy eating and diabetes friendly eating. Discussed which foods were CHO servings and how to count CHOs servings.      Intervention Plan   Intervention Prescribe, educate and counsel regarding individualized specific dietary modifications aiming towards targeted core components such as weight, hypertension, lipid management, diabetes, heart failure and other comorbidities.;Nutrition handout(s) given to patient.    Expected Outcomes Short Term Goal: Understand basic principles of dietary content, such as calories, fat, sodium, cholesterol and nutrients.;Short Term Goal: A plan has been developed with personal nutrition goals set during dietitian appointment.;Long Term Goal: Adherence to prescribed nutrition plan.           Nutrition Assessments:  MEDIFICTS Score Key:  ?70 Need to make dietary changes   40-70 Heart Healthy Diet  ? 40 Therapeutic Level Cholesterol Diet  Flowsheet Row Cardiac Rehab from 09/19/2020 in [facility] Cardiac and Pulmonary Rehab  Picture Your Plate Total Score on Admission 44     Picture Your Plate Scores:  <56 Unhealthy dietary pattern with much room for improvement.  41-50 Dietary pattern unlikely to meet recommendations for good health and room for improvement.  51-60 More healthful dietary pattern, with some room for improvement.   >60 Healthy dietary pattern, although there may be some specific behaviors that could be improved.    Nutrition Goals Re-Evaluation:   Nutrition Goals Discharge (Final Nutrition Goals  Re-Evaluation):   Psychosocial: Target Goals: Acknowledge presence or absence of significant depression and/or stress, maximize coping skills, provide positive support system. Participant is able to verbalize types and ability to use techniques and skills needed for reducing stress and depression.   Education: Stress, Anxiety, and Depression - Group verbal and visual presentation to define topics covered.  Reviews how body is impacted by stress, anxiety, and depression.  Also discusses healthy ways to reduce stress and to treat/manage anxiety and depression.  Written material given at graduation.   Education: Sleep Hygiene -Provides group verbal and written instruction about how sleep can affect your health.  Define sleep hygiene, discuss sleep cycles and impact of sleep habits. Review good sleep hygiene tips.    Initial Review & Psychosocial Screening:  Initial Psych Review & Screening - 09/11/20 1145      Initial Review   Current issues with Current Anxiety/Panic;Current Stress Concerns    Source of Stress Concerns Chronic Illness    Comments Stress over what her surgery and her recovery will do to her Diabetes, getting back to work  safely and understanding all that happened.      Family Dynamics   Good Support System? Yes   daughter, brothers.     Barriers   Psychosocial barriers to participate in program There are no identifiable barriers or psychosocial needs.      Screening Interventions   Interventions Encouraged to exercise;To provide support and resources with identified psychosocial needs    Expected Outcomes Short Term goal: Utilizing psychosocial counselor, staff and physician to assist with identification of specific Stressors or current issues interfering with healing process. Setting desired goal for each stressor or current issue identified.;Long Term Goal: Stressors or current issues are controlled or eliminated.;Short Term goal: Identification and review with  participant of any Quality of Life or Depression concerns found by scoring the questionnaire.;Long Term goal: The participant improves quality of Life and PHQ9 Scores as seen by post scores and/or verbalization of changes           Quality of Life Scores:   Quality of Life - 09/19/20 1500      Quality of Life   Select Quality of Life      Quality of Life Scores   Health/Function Pre 21.8 %    Socioeconomic Pre 17.44 %    Psych/Spiritual Pre 24 %    Family Pre 25.8 %    GLOBAL Pre 21.8 %          Scores of 19 and below usually indicate a poorer quality of life in these areas.  A difference of  2-3 points is a clinically meaningful difference.  A difference of 2-3 points in the total score of the Quality of Life Index has been associated with significant improvement in overall quality of life, self-image, physical symptoms, and general health in studies assessing change in quality of life.  PHQ-9: Recent Review Flowsheet Data    Depression screen [facility] 2/9 09/19/2020   Decreased Interest 1   Down, Depressed, Hopeless 1   PHQ - 2 Score 2   Altered sleeping 1   Tired, decreased energy 1   Change in appetite 1   Feeling bad or failure about yourself  0   Trouble concentrating 0   Moving slowly or fidgety/restless 0   Suicidal thoughts 0   PHQ-9 Score 5   Difficult doing work/chores Not difficult at all     Interpretation of Total Score  Total Score Depression Severity:  1-4 = Minimal depression, 5-9 = Mild depression, 10-14 = Moderate depression, 15-19 = Moderately severe depression, 20-27 = Severe depression   Psychosocial Evaluation and Intervention:  Psychosocial Evaluation - 09/11/20 1202      Psychosocial Evaluation & Interventions   Interventions Encouraged to exercise with the program and follow exercise prescription;Relaxation education    Comments Patriciann has no barriers to entry to the program. She has some anxiety about her chest "opening up if she pushes to  hard". Spoke to her that we have never seen this happen here as  we will start slow and work until she feels healed before increasing the her workloads.  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 She  has a good support system with her daughter and her 2 brothers. She is ready to get started and learn about her heart disease,  learn the exercise and nutriton steps to lead to a healthy lifestyle and continue to work on managing her diabetes. Lashai should do well in the program.    Expected Outcomes STG: Attends all scheduled sessions:exercise and education. Continues to verbalize any stress or fears   LTG: Cassey able to utilize what she has learned to maintain a healthy lifestyle and has been able to diminish her stress and fears about her heart disease .    Continue Psychosocial Services  Follow up required by staff           Psychosocial Re-Evaluation:  Psychosocial Re-Evaluation    [place] Name 10/30/20 1400             Psychosocial Re-Evaluation   Current issues with Current Stress Concerns;Current Sleep Concerns       Comments Elese is still stressed out some about her health.  She feels good about attending HT and the education.  She doesnt want to go back to work yet.  Staff suggested she talk to HR at her employer about disability.  She sleeps well sometimes - she does have a prescirption for sleep medication.  She will ask Dr about melatonin.       Expected Outcomes Short:  follow up with Dr about sleep meds Long: develop good stress management and sleep habits              Psychosocial Discharge (Final Psychosocial Re-Evaluation):  Psychosocial Re-Evaluation - 10/30/20 1400      Psychosocial Re-Evaluation   Current issues with Current Stress Concerns;Current Sleep Concerns    Comments Polly is  still stressed out some about her health.  She feels good about attending HT and the education.  She doesnt want to go back to work yet.  Staff suggested she talk to HR at her employer about disability.  She sleeps well sometimes - she does have a prescirption for sleep medication.  She will ask Dr about melatonin.    Expected Outcomes Short:  follow up with Dr about sleep meds Long: develop good stress management and sleep habits           Vocational Rehabilitation: Provide vocational rehab assistance to qualifying candidates.   Vocational Rehab Evaluation & Intervention:  Vocational Rehab - 09/11/20 1153      Initial Vocational Rehab Evaluation & Intervention   Assessment shows need for Vocational Rehabilitation No           Education: Education Goals: Education classes will be provided on a variety of topics geared toward better understanding of heart health and risk factor modification. Participant will state understanding/return demonstration of topics presented as noted by education test scores.  Learning Barriers/Preferences:  Learning Barriers/Preferences - 09/11/20 1152      Learning Barriers/Preferences   Learning Barriers None    Learning Preferences Video           General Cardiac Education Topics:  AED/CPR: - Group verbal and written instruction with the use of models to demonstrate the basic use of the AED with the basic ABC's of resuscitation.   Anatomy and Cardiac Procedures: - Group verbal and visual presentation and models provide information about basic cardiac anatomy and function. Reviews the testing methods done to diagnose heart disease and the outcomes of the test results. Describes the treatment choices: Medical Management, Angioplasty, or Coronary Bypass Surgery for treating various heart conditions including Myocardial Infarction, Angina, Valve Disease, and Cardiac Arrhythmias.  Written material given at graduation.   Medication Safety: -  Group  verbal and visual instruction to review commonly prescribed medications for heart and lung disease. Reviews the medication, class of the drug, and side effects. Includes the steps to properly store meds and maintain the prescription regimen.  Written material given at graduation.   Intimacy: - Group verbal instruction through game format to discuss how heart and lung disease can affect sexual intimacy. Written material given at graduation..   Know Your Numbers and Heart Failure: - Group verbal and visual instruction to discuss disease risk factors for cardiac and pulmonary disease and treatment options.  Reviews associated critical values for Overweight/Obesity, Hypertension, Cholesterol, and Diabetes.  Discusses basics of heart failure: signs/symptoms and treatments.  Introduces Heart Failure Zone chart for action plan for heart failure.  Written material given at graduation.   Infection Prevention: - Provides verbal and written material to individual with discussion of infection control including proper hand washing and proper equipment cleaning during exercise session. Flowsheet Row Cardiac Rehab from 10/04/2020 in [facility] Cardiac and Pulmonary Rehab  Date 09/19/20  Educator AS  Instruction Review Code 1- Verbalizes Understanding      Falls Prevention: - Provides verbal and written material to individual with discussion of falls prevention and safety. Flowsheet Row Cardiac Rehab from 10/04/2020 in [facility] Cardiac and Pulmonary Rehab  Date 09/19/20  Educator AS  Instruction Review Code 1- Verbalizes Understanding      Other: -Provides group and verbal instruction on various topics (see comments)   Knowledge Questionnaire Score:  Knowledge Questionnaire Score - 09/19/20 1501      Knowledge Questionnaire Score   Pre Score 23/26 angina nutrition           Core Components/Risk Factors/Patient Goals at Admission:  Personal Goals and Risk Factors at Admission - 09/19/20 1459       Core Components/Risk Factors/Patient Goals on Admission    Weight Management Weight Loss;Yes    Intervention Weight Management/Obesity: Establish reasonable short term and long term weight goals.;Weight Management: Develop a combined nutrition and exercise program designed to reach desired caloric intake, while maintaining appropriate intake of nutrient and fiber, sodium and fats, and appropriate energy expenditure required for the weight goal.;Weight Management: Provide education and appropriate resources to help participant work on and attain dietary goals.    Admit Weight 186 lb (84.4 kg)    Goal Weight: Short Term 180 lb (81.6 kg)    Goal Weight: Long Term 160 lb (72.6 kg)    Expected Outcomes Short Term: Continue to assess and modify interventions until short term weight is achieved;Long Term: Adherence to nutrition and physical activity/exercise program aimed toward attainment of established weight goal;Weight Loss: Understanding of general recommendations for a balanced deficit meal plan, which promotes 1-2 lb weight loss per week and includes a negative energy balance of [phone] kcal/d    Diabetes Yes    Intervention Provide education about signs/symptoms and action to take for hypo/hyperglycemia.;Provide education about proper nutrition, including hydration, and aerobic/resistive exercise prescription along with prescribed medications to achieve blood glucose in normal ranges: Fasting glucose 65-99 mg/dL    Expected Outcomes Short Term: Participant verbalizes understanding of the signs/symptoms and immediate care of hyper/hypoglycemia, proper foot care and importance of medication, aerobic/resistive exercise and nutrition plan for blood glucose control.;Long Term: Attainment of HbA1C < 7%.    Intervention Provide education on lifestyle modifcations including regular physical activity/exercise, weight management, moderate sodium restriction and increased consumption of fresh fruit, vegetables, and  low fat dairy, alcohol moderation, and smoking cessation.;Monitor  prescription use compliance.    Expected Outcomes Short Term: Continued assessment and intervention until BP is < 140/42mm HG in hypertensive participants. < 130/14mm HG in hypertensive participants with diabetes, heart failure or chronic kidney disease.;Long Term: Maintenance of blood pressure at goal levels.    Lipids Yes    Intervention Provide education and support for participant on nutrition & aerobic/resistive exercise along with prescribed medications to achieve LDL 70mg , HDL >40mg .    Expected Outcomes Short Term: Participant states understanding of desired cholesterol values and is compliant with medications prescribed. Participant is following exercise prescription and nutrition guidelines.;Long Term: Cholesterol controlled with medications as prescribed, with individualized exercise RX and with personalized nutrition plan. Value goals: LDL < 70mg , HDL > 40 mg.           Education:Diabetes - Individual verbal and written instruction to review signs/symptoms of diabetes, desired ranges of glucose level fasting, after meals and with exercise. Acknowledge that pre and post exercise glucose checks will be done for 3 sessions at entry of program. [place] from 10/04/2020 in [facility] Cardiac and Pulmonary Rehab  Date 09/19/20  Educator AS  Instruction Review Code 1- Verbalizes Understanding      Core Components/Risk Factors/Patient Goals Review:   Goals and Risk Factor Review    Row Name 10/30/20 1356             Core Components/Risk Factors/Patient Goals Review   Personal Goals Review Weight Management/Obesity;Hypertension;Lipids;Diabetes       Review Shianna is monitoring BP and BG at home.  Her normal FBG is around 90-95.  She also monitors BP at home .  Today was 110/62 at [facility].  She is taking all medications as directed.       Expected Outcomes Short: continue to monitor vitals at home Long:  manage  risk factors long term              Core Components/Risk Factors/Patient Goals at Discharge (Final Review):   Goals and Risk Factor Review - 10/30/20 1356      Core Components/Risk Factors/Patient Goals Review   Personal Goals Review Weight Management/Obesity;Hypertension;Lipids;Diabetes    Review Ronae is monitoring BP and BG at home.  Her normal FBG is around 90-95.  She also monitors BP at home .  Today was 110/62 at [facility].  She is taking all medications as directed.    Expected Outcomes Short: continue to monitor vitals at home Long:  manage risk factors long term           ITP Comments:  ITP Comments    Row Name 09/11/20 1222 09/19/20 1506 09/21/20 1458 10/04/20 0858 10/19/20 1458   ITP Comments Virtual orientation call completed today. shehas an appointment on Date: 09/13/2020  for EP eval and gym Orientation.  Documentation of diagnosis can be found in [facility] Date: 08/02/2020. Completed 6MWT and gym orientation. Initial ITP created and sent for review to Dr. Emily Filbert, Medical Director. First full day of exercise!  Patient was oriented to gym and equipment including functions, settings, policies, and procedures.  Patient's individual exercise prescription and treatment plan were reviewed.  All starting workloads were established based on the results of the 6 minute walk test done at initial orientation visit.  The plan for exercise progression was also introduced and progression will be customized based on patient's performance and goals. 30 Day review completed. Medical Director ITP review done, changes made as directed, and signed approval by Medical Director.  New to  program Verlinda stopped by today to inform staff she had a loop recorder placed yesterday and she didn't feel comfortable exercising. She was nervous about it and everthing that is going on with her heart. Information and support given to her as she tries to figure everything out. She plans to return Monday as she really  enjoys the program. She was encouraged to reach out to her doctor and support staff with further questions regarding the loop recorder and that more education on a heart healthy lifestyle will be given while in the program.   [place] Name 11/01/20 0620           ITP Comments 30 Day review completed. Medical Director ITP review done, changes made as directed, and signed approval by Medical Director.              Comments:

## 2020-11-06 ENCOUNTER — Other Ambulatory Visit: Payer: Self-pay

## 2020-11-06 DIAGNOSIS — Z951 Presence of aortocoronary bypass graft: Secondary | ICD-10-CM | POA: Diagnosis not present

## 2020-11-06 NOTE — Progress Notes (Signed)
Daily Session Note  Patient Details  Name: Stacey Richardson MRN: [id number] Date of Birth: 12-26-1965 Referring Provider:   Flowsheet Row Cardiac Rehab from 09/19/2020 in [facility] Cardiac and Pulmonary Rehab  Referring Provider End      Encounter Date: 11/06/2020  Check In:  Session Check In - 11/06/20 1412      Check-In   Supervising physician immediately available to respond to emergencies See telemetry face sheet for immediately available ER MD    Location [facility]-Cardiac & Pulmonary Rehab    Staff Present Birdie Sons, MPA, Mauricia Area, BS, ACSM CEP, Exercise Physiologist;Kara Eliezer Bottom, MS Exercise Physiologist    Virtual Visit No    Medication changes reported     No    Fall or balance concerns reported    No    Warm-up and Cool-down Performed on first and last piece of equipment    Resistance Training Performed Yes    VAD Patient? No    PAD/SET Patient? No      Pain Assessment   Currently in Pain? No/denies              Social History   Tobacco Use  Smoking Status Former Smoker  Smokeless Tobacco Never Used  Tobacco Comment   Quit 30 years ago    Goals Met:  Independence with exercise equipment Exercise tolerated well No report of cardiac concerns or symptoms Strength training completed today  Goals Unmet:  Not Applicable  Comments: Pt able to follow exercise prescription today without complaint.  Will continue to monitor for progression.    Dr. Emily Filbert is Medical Director for [facility] [place] [facility].

## 2020-11-08 ENCOUNTER — Ambulatory Visit (INDEPENDENT_AMBULATORY_CARE_PROVIDER_SITE_OTHER): Payer: 59

## 2020-11-08 ENCOUNTER — Other Ambulatory Visit: Payer: Self-pay

## 2020-11-08 ENCOUNTER — Encounter: Payer: 59 | Attending: Internal Medicine

## 2020-11-08 DIAGNOSIS — I5022 Chronic systolic (congestive) heart failure: Secondary | ICD-10-CM | POA: Diagnosis not present

## 2020-11-08 DIAGNOSIS — Z951 Presence of aortocoronary bypass graft: Secondary | ICD-10-CM | POA: Diagnosis present

## 2020-11-08 LAB — ECHOCARDIOGRAM LIMITED
Area-P 1/2: 4.19 cm2
Calc EF: 38 %
S' Lateral: 4.7 cm
Single Plane A2C EF: 38.8 %
Single Plane A4C EF: 36.2 %

## 2020-11-08 MED ORDER — PERFLUTREN LIPID MICROSPHERE
1.0000 mL | INTRAVENOUS | Status: AC | PRN
  Administered 2020-11-08: 2 mL via INTRAVENOUS

## 2020-11-08 NOTE — Progress Notes (Signed)
Daily Session Note  Patient Details  Name: NICOLA HEINEMANN MRN: [id number] Date of Birth: 10/01/1965 Referring Provider:   Flowsheet Row Cardiac Rehab [facility] 09[facility]19/2020 in  [facility] Cardiac [facility]  Referring Provider End      Encounter Date: 11/08/2020  Check In:  Session Check In - 11/08/20 1415      Check-In   Supervising physician immediately available to respond to emergencies See telemetry face sheet for immediately available ER MD    Location [facility]-Cardiac & Pulmonary Rehab    Staff Present Birdie Sons, MPA, RN;Joseph Lou Miner, MS Exercise Physiologist    Virtual Visit No    Medication changes reported     No    Fall or balance concerns reported    No    Warm-up and Cool-down Performed on first and last piece of equipment    Resistance Training Performed Yes    VAD Patient? No    PAD/SET Patient? No      Pain Assessment   Currently in Pain? No/denies              Social History   Tobacco Use  Smoking Status Former Smoker  Smokeless Tobacco Never Used  Tobacco Comment   Quit 30 years ago    Goals Met:  Independence with exercise equipment Exercise tolerated well No report of cardiac concerns or symptoms Strength training completed today  Goals Unmet:  Not Applicable  Comments: Pt able to follow exercise prescription today without complaint.  Will continue to monitor for progression.    Dr. Emily Filbert is Medical Director for [facility] [facility].

## 2020-11-13 ENCOUNTER — Other Ambulatory Visit: Payer: Self-pay

## 2020-11-13 ENCOUNTER — Encounter: Payer: Self-pay | Admitting: Family

## 2020-11-13 ENCOUNTER — Ambulatory Visit (INDEPENDENT_AMBULATORY_CARE_PROVIDER_SITE_OTHER): Payer: 59 | Admitting: Family

## 2020-11-13 VITALS — BP 144/80 | HR 88 | Ht 66.0 in | Wt 186.0 lb

## 2020-11-13 DIAGNOSIS — I471 Supraventricular tachycardia: Secondary | ICD-10-CM

## 2020-11-13 DIAGNOSIS — E785 Hyperlipidemia, unspecified: Secondary | ICD-10-CM

## 2020-11-13 DIAGNOSIS — I255 Ischemic cardiomyopathy: Secondary | ICD-10-CM | POA: Diagnosis not present

## 2020-11-13 DIAGNOSIS — Z951 Presence of aortocoronary bypass graft: Secondary | ICD-10-CM

## 2020-11-13 DIAGNOSIS — I639 Cerebral infarction, unspecified: Secondary | ICD-10-CM

## 2020-11-13 DIAGNOSIS — I5022 Chronic systolic (congestive) heart failure: Secondary | ICD-10-CM

## 2020-11-13 DIAGNOSIS — I10 Essential (primary) hypertension: Secondary | ICD-10-CM

## 2020-11-13 DIAGNOSIS — R42 Dizziness and giddiness: Secondary | ICD-10-CM | POA: Diagnosis not present

## 2020-11-13 MED ORDER — SPIRONOLACTONE 25 MG PO TABS: 12.5000 mg | ORAL_TABLET | Freq: Every day | ORAL | 1 refills | Status: DC

## 2020-11-13 NOTE — Patient Instructions (Signed)
Medication Instructions:  Your provider has recommended you make the following change in your medication:   START Spironolactone 12.5mg  (half tablet) daily  This will help to strengthen your heart pumping function alongside your Losartan and Carvedilol.   *If you need a refill on your cardiac medications before your next appointment, please call your pharmacy*   Lab Work: Your provider recommends that you return for lab work today: BMP  If you have labs (blood work) drawn today and your tests are completely normal, you will receive your results only by: Marland Kitchen MyChart Message (if you have MyChart) OR . A paper copy in the mail If you have any lab test that is abnormal or we need to change your treatment, we will call you to review the results.  Testing/Procedures: Your echocardiogram showed your heart pumping function was low, but stable compared to previous. It was 30-35%. Your Losartan (Cozaar), Carvedilol (Coreg), Spironolactone are all medications to help strengthen your heart.   Follow-Up: At [facility], you and your health needs are our priority.  As part of our continuing mission to provide you with exceptional heart care, we have created designated Provider Care Teams.  These Care Teams include your primary Cardiologist (physician) and Advanced Practice Providers (APPs -  Physician Assistants and Nurse Practitioners) who all work together to provide you with the care you need, when you need it.  We recommend signing up for the patient portal called "MyChart".  Sign up information is provided on this After Visit Summary.  MyChart is used to connect with patients for Virtual Visits (Telemedicine).  Patients are able to view lab/test results, encounter notes, upcoming appointments, etc.  Non-urgent messages can be sent to your provider as well.   To learn more about what you can do with MyChart, go to [email or web address].    Your next appointment:    11/29/20 as scheduled with Dr.  Quentin Ore  AND  In 4-6 weeks in person with  [name], MD or one of the following Advanced Practice Providers on your designated Care Team:    Murray Hodgkins, NP  Christell Faith, PA-C  Marrianne Mood, PA-C  Cadence Kathlen Mody, [place]  Laurann Montana, NP  Other Instructions  Heart Healthy Diet Recommendations: A low-salt diet is recommended. Meats should be grilled, baked, or boiled. Avoid fried foods. Focus on lean protein sources like fish or chicken with vegetables and fruits. The American Heart Association is a Microbiologist!  American Heart Association Diet and Lifeystyle Recommendations   Exercise recommendations: The American Heart Association recommends 150 minutes of moderate intensity exercise weekly. Try 30 minutes of moderate intensity exercise 4-5 times per week. This could include walking, jogging, or swimming.

## 2020-11-13 NOTE — Progress Notes (Signed)
Office Visit    Patient Name: Stacey Richardson Date of Encounter: 11/13/2020  PCP:  Theotis Burrow, MD   [place]  Cardiologist:  [name], MD  Electrophysiologist:  Vickie Epley, MD   Chief Complaint    Stacey Richardson is a 55 y.o. female with a hx of stroke, chronic HFrEF due to ischemic cardiomyopathy, CAD s/p CABG (LIMA-LAD, RIMA-OM1), hypertension, hyperlipidemia, type 2 diabetes mellitus, obesity, SVT, brief episodes Wenckebach, lightheadedness on previous monitor presents today for follow up of heart failure.   Past Medical History    Past Medical History:  Diagnosis Date  . CAD (coronary artery disease)   . CVA (cerebral vascular accident) ([place])   . Diabetes mellitus   . HFrEF (heart failure with reduced ejection fraction) ([place])   . HLD (hyperlipidemia)   . Hypertension    Past Surgical History:  Procedure Laterality Date  . CARDIAC CATHETERIZATION    . CLIPPING OF ATRIAL APPENDAGE N/A 08/06/2020   Procedure: CLIPPING OF ATRIAL APPENDAGE USING ATRICURE CLIP SIZE 40MM;  Surgeon: Wonda Olds, MD;  Location: [place];  Service: Open Heart Surgery;  Laterality: N/A;  . CORONARY ARTERY BYPASS GRAFT N/A 08/06/2020   Procedure: CORONARY ARTERY BYPASS GRAFTING (CABG) X2, USING BILATERAL INTERNAL MAMMARY ARTERIES;  Surgeon: Wonda Olds, MD;  Location: [place];  Service: Open Heart Surgery;  Laterality: N/A;  . KNEE SURGERY    . RIGHT/LEFT HEART CATH AND CORONARY ANGIOGRAPHY Bilateral 08/02/2020   Procedure: RIGHT/LEFT HEART CATH AND CORONARY ANGIOGRAPHY;  Surgeon: [name], MD;  Location: [place] CV [facility];  Service: Cardiovascular;  Laterality: Bilateral;  . TEE WITHOUT CARDIOVERSION N/A 07/18/2020   Procedure: TRANSESOPHAGEAL ECHOCARDIOGRAM (TEE);  Surgeon: [name], MD;  Location: [facility];  Service: Cardiovascular;  Laterality: N/A;  . TEE WITHOUT CARDIOVERSION N/A 08/06/2020   Procedure:  TRANSESOPHAGEAL ECHOCARDIOGRAM (TEE);  Surgeon: Wonda Olds, MD;  Location: [place];  Service: Open Heart Surgery;  Laterality: N/A;    Allergies  Allergies  Allergen Reactions  . Peanut Butter Flavor Anaphylaxis and Swelling  . Lisinopril Cough    History of Present Illness    Stacey Richardson is a 55 y.o. female with a hx of stroke, chronic HFrEF due to ischemic cardiomyopathy, CAD s/p CABG (LIMA-LAD, RIMA-OM1), hypertension, hyperlipidemia, type 2 diabetes mellitus, obesity, SVT, brief episodes Wenckebach, lightheadedness. She was last seen 10/18/20 by Dr. Quentin Ore with implant of loop recorder at that time.  Hospitalized 06/12/2020 with acute speech difficulty and dysphagia. Diagnosed with a left frontal lobe infarct. Echo cardiogram revealed reduced LVEF 35-40%. She was started on carvedilol, losartan, aspirin, clopidogrel, rosuvastatin.   At follow up 06/19/20 noted NYHA III symptoms and ZIO placed showing no evidence of atrial fib/flutter, brief episodes of Wenckebach with Coreg discontinued at that time.. She has has persistent difficulties with lightheadedness despite multiple medication changes. Presented to ED 07/11/20 with MRI with small subacute appearing white matter infarct in left corona radiata new compared to previous, in same vicinity as prior ischemia as well as tiny area of cortical encephalomalacia corresponding to left operculum infarct last month. Reviewed by neurology with notation of subacute stroke recommended for outpatient follow up and to continue aspirin.   Underwent TEE 07/18/20 for evaluation of possible PFO with no evidence of thrombus, EF 30-35%, negative bubble study, though notation of question small tunnel in interatrial septum. She was recommended for [facility] to rule out ischemia as etiology of heart  failure which was performed 08/02/20 showing severe ostial LMCA stenosis and mild nonobstructive coronary artery disease of LAD and LCx. She was transferred to [facility] for CABG which was performed 08/06/20 with LIMA-LAD, RIMA-OM1 and left atrial appendage clipping. For further evaluation of possible PFO transcranial doppler with bubbles was recommended and positive. On review by Dr. Saunders Revel and Dr. Burt Knack, no indication for PFO closure as no clear evidence that PFO exists. At follow up with Dr. Saunders Revel 10/11/20 low dose Losartan as well as Coreg were initiated for optimization of heart failure therapies. She was seen by Dr. Quentin Ore 10/18/20 and loop recorder place and discussion initiated regarding ICD for HFrEF as well as potential EP study due to history of SVT. She had an updated echo 11/08/20 LVEF 30-35%, LV global longitudinal strain -12.2%, mild MR.  She presents today for follow up.  We reviewed her recent cardiac testing in detail.  She is very anxious and tearful and very worried about dying. Reassurance provided. Reports no shortness of breath at rest and stable  dyspnea on exertion. Reports no chest pain, pressure, or tightness. No edema, orthopnea, PND. Reports no palpitations.  She is tolerating losartan and carvedilol without difficulty and notes that since her CABG her lightheadedness and dizziness is resolved.  She is monitoring her diet carefully and eating low-salt diet.  EKGs/Labs/Other Studies Reviewed:   The following studies were reviewed today:   EKG:  No EKG today.  Recent Labs: 06/12/2020: TSH 1.251 07/20/2020: B Natriuretic Peptide 26.5 08/07/2020: Magnesium 2.2 10/08/2020: ALT 15 10/15/2020: BUN 9; Creatinine, Ser 0.62; Hemoglobin 9.8; Platelets 450; Potassium 4.1; Sodium 136  Recent Lipid Panel    Component Value Date/Time   CHOL 104 08/03/2020 0042   TRIG 60 08/03/2020 0042   HDL 32 (L) 08/03/2020 0042   CHOLHDL 3.3 08/03/2020 0042   VLDL 12 08/03/2020 0042   LDLCALC 60 08/03/2020 0042   Home Medications   Current Meds  Medication Sig  . acetaminophen (TYLENOL) 500 MG tablet Take 500 mg by mouth every 6 (six) hours as needed for  moderate pain or headache.  Marland Kitchen aspirin EC 81 MG tablet Take 81 mg by mouth daily.   Marland Kitchen atorvastatin (LIPITOR) 80 MG tablet Take 80 mg by mouth at bedtime.  . carvedilol (COREG) 12.5 MG tablet Take 1 tablet (12.5 mg total) by mouth 2 (two) times daily.  . clopidogrel (PLAVIX) 75 MG tablet Take 1 tablet (75 mg total) by mouth daily.  Marland Kitchen losartan (COZAAR) 25 MG tablet Take 1 tablet (25 mg total) by mouth daily.  . metFORMIN (GLUCOPHAGE-XR) 500 MG 24 hr tablet Take 1,000 mg by mouth 2 (two) times daily.  Marland Kitchen tetrahydrozoline 0.05 % ophthalmic solution Place 1-2 drops into both eyes 3 (three) times daily as needed (irritated eyes.).     Review of Systems  All other systems reviewed and are otherwise negative except as noted above.  Physical Exam    VS:  BP (!) 144/80 (BP Location: Left Arm, Patient Position: Sitting, Cuff Size: Large)   Pulse 88   Ht 5\' 6"  (1.676 m)   Wt 186 lb (84.4 kg)   LMP 08/22/2020 (Exact Date)   SpO2 95%   BMI 30.02 kg/m  , BMI Body mass index is 30.02 kg/m.  Wt Readings from Last 3 Encounters:  11/13/20 186 lb (84.4 kg)  10/18/20 185 lb 2 oz (84 kg)  10/15/20 183 lb (83 kg)    GEN: Well nourished, well developed, in no  acute distress. HEENT: normal. Neck: Supple, no JVD, carotid bruits, or masses. Cardiac: RRR, no murmurs, rubs, or gallops. No clubbing, cyanosis, edema.  Radials/DP/PT 2+ and equal bilaterally.  Respiratory:  Respirations regular and unlabored, clear to auscultation bilaterally. GI: Soft, nontender, nondistended. MS: No deformity or atrophy. Skin: Warm and dry, no rash. Neuro:  Strength and sensation are intact. Psych: Normal affect.  Assessment & Plan    1. HFrEF/ICM -Echo 11/08/2020 with persistently depressed LVEF 30 to 35%.  Upcoming appointment Dr. Quentin Ore upcoming to likely include discussion of potential ICD as LVEF <35%, extensive conversation today.  Euvolemic and well compensated on exam.  NYHA II with dyspnea on exertion,  participating cardiac rehab.  GDMT includes carvedilol 12.5 mg twice daily, losartan 25 mg daily.  Start spironolactone 12.5 mg daily for optimization of GDMT. Future considerations include transition to [facility]. BMP today for monitoring.  2. CAD s/p CABG -no chest pain, pressure, tightness.  No indication for ischemic evaluation.  GDMT includes aspirin, beta-blocker, high intensity statin.  Participating cardiac rehab.  3. Cryptogenic stroke - Upcoming follow-up with neurology. Continue aspirin, Plavix, statin.   4. Hypertension -BP mildly elevated.  Encouraged to monitor at home.  Start spironolactone, as above.  5. Lightheadedness -resolved since CABG.  6. Hyperlipidemia -continue atorvastatin 80 mg daily.  7. SVT - Continue to follow with Dr. Quentin Ore of EP.  She is tolerated implantation of loop recorder without difficulty.  Denies palpitations.  Disposition: Follow up in 2 weeks with Dr. Quentin Ore as scheduled and in 6 week(s) with Dr. Saunders Revel or APP   Signed, Loel Dubonnet, NP 11/13/2020, 3:41 PM [place]

## 2020-11-14 ENCOUNTER — Encounter: Payer: Self-pay | Admitting: Family

## 2020-11-14 LAB — BASIC METABOLIC PANEL
BUN/Creatinine Ratio: 20 (ref 9–23)
BUN: 12 mg/dL (ref 6–24)
CO2: 21 mmol/L (ref 20–29)
Calcium: 8.8 mg/dL (ref 8.7–10.2)
Chloride: 106 mmol/L (ref 96–106)
Creatinine, Ser: 0.6 mg/dL (ref 0.57–1.00)
Glucose: 123 mg/dL — ABNORMAL HIGH (ref 65–99)
Potassium: 4 mmol/L (ref 3.5–5.2)
Sodium: 142 mmol/L (ref 134–144)
eGFR: 107 mL/min/{1.73_m2} (ref >59)

## 2020-11-15 ENCOUNTER — Other Ambulatory Visit: Payer: Self-pay

## 2020-11-15 DIAGNOSIS — Z951 Presence of aortocoronary bypass graft: Secondary | ICD-10-CM | POA: Diagnosis not present

## 2020-11-15 NOTE — Progress Notes (Signed)
Daily Session Note  Patient Details  Name: Stacey Richardson MRN: [id number] Date of Birth: 1965/11/27 Referring Provider:   Flowsheet Row Cardiac Rehab from 09/19/2020 in [facility] Cardiac and Pulmonary Rehab  Referring Provider End      Encounter Date: 11/15/2020  Check In:  Session Check In - 11/15/20 1410      Check-In   Supervising physician immediately available to respond to emergencies See telemetry face sheet for immediately available ER MD    Location [facility]-Cardiac & Pulmonary Rehab    Staff Present Birdie Sons, MPA, Elveria Rising, BA, ACSM CEP, Exercise Physiologist;Kara Eliezer Bottom, MS Exercise Physiologist;Meredith Sherryll Burger, RN BSN    Virtual Visit No    Medication changes reported     Yes    Comments added 12.52m spironolactone daily    Fall or balance concerns reported    No    Warm-up and Cool-down Performed on first and last piece of equipment    Resistance Training Performed Yes    VAD Patient? No    PAD/SET Patient? No      Pain Assessment   Currently in Pain? No/denies              Social History   Tobacco Use  Smoking Status Former Smoker  Smokeless Tobacco Never Used  Tobacco Comment   Quit 30 years ago    Goals Met:  Independence with exercise equipment Exercise tolerated well Personal goals reviewed No report of cardiac concerns or symptoms Strength training completed today  Goals Unmet:  Not Applicable  Comments: Pt able to follow exercise prescription today without complaint.  Will continue to monitor for progression.    Dr. MEmily Filbertis Medical Director for [facility].

## 2020-11-16 ENCOUNTER — Telehealth: Payer: Self-pay | Admitting: Cardiology

## 2020-11-16 NOTE — Telephone Encounter (Signed)
Returning patient's call in reference to discussing a transmission from her LINQ2 received on 10/18/2020. Patient states that she was at cardiac rehab yesterday and had an episode of SVT. Transmission from 10/18/2020 through 11/16/2020 shows no alerts. Patient states that her heart rate increased to 171 BPM when she was completing activities at the Cardiac Rehab. Parameters for device discussed with patient. Patient was advised that if the [facility] receives an alert, she would be contacted. Patient not having any elevated heart rate issues today or any symptoms. Device number [id number] given to patient if she should have any problems, issues, or concerns regarding her device to contact the [facility]. Patient verbalized understanding.

## 2020-11-16 NOTE — Telephone Encounter (Signed)
Patient calling to make office aware  States at cardiac rehab yesterday she had an episode of SVT  Patient took a break from cardiac rehab today Please call to discuss

## 2020-11-17 ENCOUNTER — Telehealth: Payer: Self-pay | Admitting: Cardiology

## 2020-11-17 NOTE — Telephone Encounter (Signed)
  Patient Consent for Virtual Visit         Stacey Richardson has provided verbal consent on 11/17/2020 for a virtual visit (video or telephone).   CONSENT FOR VIRTUAL VISIT FOR:  Stacey Richardson  By participating in this virtual visit I agree to the following:  I hereby voluntarily request, consent and authorize [facility] and its employed or contracted physicians, physician assistants, nurse practitioners or other licensed health care professionals (the Practitioner), to provide me with telemedicine health care services (the "Services") as deemed necessary by the treating Practitioner. I acknowledge and consent to receive the Services by the Practitioner via telemedicine. I understand that the telemedicine visit will involve communicating with the Practitioner through live audiovisual communication technology and the disclosure of certain medical information by electronic transmission. I acknowledge that I have been given the opportunity to request an in-person assessment or other available alternative prior to the telemedicine visit and am voluntarily participating in the telemedicine visit.  I understand that I have the right to withhold or withdraw my consent to the use of telemedicine in the course of my care at any time, without affecting my right to future care or treatment, and that the Practitioner or I may terminate the telemedicine visit at any time. I understand that I have the right to inspect all information obtained and/or recorded in the course of the telemedicine visit and may receive copies of available information for a reasonable fee.  I understand that some of the potential risks of receiving the Services via telemedicine include:  Marland Kitchen Delay or interruption in medical evaluation due to technological equipment failure or disruption; . Information transmitted may not be sufficient (e.g. poor resolution of images) to allow for appropriate medical decision making by the Practitioner;  and/or  . In rare instances, security protocols could fail, causing a breach of personal health information.  Furthermore, I acknowledge that it is my responsibility to provide information about my medical history, conditions and care that is complete and accurate to the best of my ability. I acknowledge that Practitioner's advice, recommendations, and/or decision may be based on factors not within their control, such as incomplete or inaccurate data provided by me or distortions of diagnostic images or specimens that may result from electronic transmissions. I understand that the practice of medicine is not an exact science and that Practitioner makes no warranties or guarantees regarding treatment outcomes. I acknowledge that a copy of this consent can be made available to me via my patient portal ([facility]), or I can request a printed copy by calling the office of [facility].    I understand that my insurance will be billed for this visit.   I have read or had this consent read to me. . I understand the contents of this consent, which adequately explains the benefits and risks of the Services being provided via telemedicine.  . I have been provided ample opportunity to ask questions regarding this consent and the Services and have had my questions answered to my satisfaction. . I give my informed consent for the services to be provided through the use of telemedicine in my medical care

## 2020-11-17 NOTE — Telephone Encounter (Signed)
Patient calling to discuss upcoming visit.    Unable to do in person due to new job.  Patient changed to virtual.  Please advise if appropriate or needs to push out visit.

## 2020-11-21 ENCOUNTER — Ambulatory Visit (INDEPENDENT_AMBULATORY_CARE_PROVIDER_SITE_OTHER): Payer: 59

## 2020-11-21 DIAGNOSIS — I255 Ischemic cardiomyopathy: Secondary | ICD-10-CM

## 2020-11-21 LAB — CUP PACEART REMOTE DEVICE CHECK
Date Time Interrogation Session: 20220314171150
Implantable Pulse Generator Implant Date: 20220209

## 2020-11-29 ENCOUNTER — Telehealth: Payer: Self-pay

## 2020-11-29 ENCOUNTER — Telehealth (INDEPENDENT_AMBULATORY_CARE_PROVIDER_SITE_OTHER): Payer: 59 | Admitting: Cardiology

## 2020-11-29 ENCOUNTER — Other Ambulatory Visit: Payer: Self-pay

## 2020-11-29 ENCOUNTER — Encounter: Payer: Self-pay | Admitting: *Deleted

## 2020-11-29 DIAGNOSIS — Z951 Presence of aortocoronary bypass graft: Secondary | ICD-10-CM

## 2020-11-29 DIAGNOSIS — I255 Ischemic cardiomyopathy: Secondary | ICD-10-CM

## 2020-11-29 DIAGNOSIS — I471 Supraventricular tachycardia, unspecified: Secondary | ICD-10-CM

## 2020-11-29 DIAGNOSIS — I5022 Chronic systolic (congestive) heart failure: Secondary | ICD-10-CM

## 2020-11-29 DIAGNOSIS — I639 Cerebral infarction, unspecified: Secondary | ICD-10-CM

## 2020-11-29 NOTE — Patient Instructions (Addendum)
Medication Instructions:  Your physician recommends that you continue on your current medications as directed. Please refer to the Current Medication list given to you today. *If you need a refill on your cardiac medications before your next appointment, please call your pharmacy*  Lab Work: None ordered. If you have labs (blood work) drawn today and your tests are completely normal, you will receive your results only by: Marland Kitchen MyChart Message (if you have MyChart) OR . A paper copy in the mail If you have any lab test that is abnormal or we need to change your treatment, we will call you to review the results.  Testing/Procedures: None ordered.  Follow-Up: At [facility], you and your health needs are our priority.  As part of our continuing mission to provide you with exceptional heart care, we have created designated Provider Care Teams.  These Care Teams include your primary Cardiologist (physician) and Advanced Practice Providers (APPs -  Physician Assistants and Nurse Practitioners) who all work together to provide you with the care you need, when you need it.  Your next appointment:    You will be scheduled for an EP study and ICD implant.

## 2020-11-29 NOTE — H&P (View-Only) (Signed)
Virtual Visit via Telephone Note   This visit type was conducted due to national recommendations for restrictions regarding the COVID-19 Pandemic (e.g. social distancing) in an effort to limit this patient's exposure and mitigate transmission in our community.  Due to her co-morbid illnesses, this patient is at least at moderate risk for complications without adequate follow up.  This format is felt to be most appropriate for this patient at this time.  The patient did not have access to video technology/had technical difficulties with video requiring transitioning to audio format only (telephone).  All issues noted in this document were discussed and addressed.  No physical exam could be performed with this format.  Please refer to the patient's chart for her  consent to telehealth for [facility].    Date:  11/29/2020   ID:  Stacey Richardson, DOB 1965-12-01, MRN [id number] The patient was identified using 2 identifiers.  Patient Location: Home Provider Location: Office/Clinic   PCP:  Theotis Burrow, MD   [facility]  Cardiologist:  [name], MD  Advanced Practice Provider:  No care team member to display Electrophysiologist:  Vickie Epley, MD   Chief Complaint: Follow-up recent studies.  History of Present Illness:    Stacey Richardson is a 55 y.o. female with a history of chronic systolic heart failure, coronary artery disease post bypass surgery and SVT who presents for follow-up.  I last saw the patient October 18, 2020.  During that appointment a loop recorder was implanted given history of cryptogenic stroke.  Since the implant of the loop recorder, the patient has had no episodes of atrial fibrillation but she has had continued episodes of SVT.  There are symptomatic with palpitations.  Today during the phone call she tells me she is doing about the same as last time we chatted.  She did tell me about the episode of SVT.  She has had no  further episodes of chest pain.  She is concerned about her overall health and is interested in proceeding with both the ICD and EP study.   Past Medical History:  Diagnosis Date  . CAD (coronary artery disease)   . CVA (cerebral vascular accident) ([place])   . Diabetes mellitus   . HFrEF (heart failure with reduced ejection fraction) ([place])   . HLD (hyperlipidemia)   . Hypertension    Past Surgical History:  Procedure Laterality Date  . CARDIAC CATHETERIZATION    . CLIPPING OF ATRIAL APPENDAGE N/A 08/06/2020   Procedure: CLIPPING OF ATRIAL APPENDAGE USING ATRICURE CLIP SIZE 40MM;  Surgeon: Wonda Olds, MD;  Location: [facility];  Service: Open Heart Surgery;  Laterality: N/A;  . CORONARY ARTERY BYPASS GRAFT N/A 08/06/2020   Procedure: CORONARY ARTERY BYPASS GRAFTING (CABG) X2, USING BILATERAL INTERNAL MAMMARY ARTERIES;  Surgeon: Wonda Olds, MD;  Location: [facility];  Service: Open Heart Surgery;  Laterality: N/A;  . KNEE SURGERY    . RIGHT/LEFT HEART CATH AND CORONARY ANGIOGRAPHY Bilateral 08/02/2020   Procedure: RIGHT/LEFT HEART CATH AND CORONARY ANGIOGRAPHY;  Surgeon: [name], MD;  Location: [facility] CV LAB;  Service: Cardiovascular;  Laterality: Bilateral;  . TEE WITHOUT CARDIOVERSION N/A 07/18/2020   Procedure: TRANSESOPHAGEAL ECHOCARDIOGRAM (TEE);  Surgeon: [name], MD;  Location: [facility];  Service: Cardiovascular;  Laterality: N/A;  . TEE WITHOUT CARDIOVERSION N/A 08/06/2020   Procedure: TRANSESOPHAGEAL ECHOCARDIOGRAM (TEE);  Surgeon: Wonda Olds, MD;  Location: [facility];  Service: Open Heart Surgery;  Laterality: N/A;     No outpatient medications have been marked as taking for the 11/29/20 encounter (Video Visit) with Vickie Epley, MD.     Allergies:   Peanut butter flavor and Lisinopril   Social History   Tobacco Use  . Smoking status: Former Research scientist (life sciences)  . Smokeless tobacco: Never Used  . Tobacco comment: Quit 30 years ago  Vaping Use  .  Vaping Use: Never used  Substance Use Topics  . Alcohol use: Yes    Comment: social  . Drug use: No     Family Hx: The patient's family history includes Heart disease in her mother; Heart failure in her brother; Hypertension in her brother.  ROS:   Please see the history of present illness.     All other systems reviewed and are negative.   Prior CV studies:   The following studies were reviewed today:  November 08, 2020 echo personally reviewed Left ventricular function severely decreased, 30% LV global longitudinal strain 12% No significant valvular abnormalities  Loop interrogation from November 20, 2020 personally reviewed shows no arrhythmias.  Labs/Other Tests and Data Reviewed:    EKG:  No ECG reviewed.  Recent Labs: 06/12/2020: TSH 1.251 07/20/2020: B Natriuretic Peptide 26.5 08/07/2020: Magnesium 2.2 10/08/2020: ALT 15 10/15/2020: Hemoglobin 9.8; Platelets 450 11/13/2020: BUN 12; Creatinine, Ser 0.60; Potassium 4.0; Sodium 142   Recent Lipid Panel Lab Results  Component Value Date/Time   CHOL 104 08/03/2020 12:42 AM   TRIG 60 08/03/2020 12:42 AM   HDL 32 (L) 08/03/2020 12:42 AM   CHOLHDL 3.3 08/03/2020 12:42 AM   LDLCALC 60 08/03/2020 12:42 AM    Wt Readings from Last 3 Encounters:  11/13/20 186 lb (84.4 kg)  10/18/20 185 lb 2 oz (84 kg)  10/15/20 183 lb (83 kg)     Risk Assessment/Calculations:      Objective:    Vital Signs:  LMP 08/22/2020 (Exact Date)    PSYCH:  normal affect  ASSESSMENT & PLAN:    1. Chronic systolic heart failure secondary to ischemic cardiomyopathy NYHA class II-III symptoms.  Warm and dry.  On good medical therapy.  Recommend she continue taking losartan, spironolactone, carvedilol, aspirin, atorvastatin.  We again discussed using an ICD to help protect against sudden cardiac death given her persistently reduced ejection fraction in the setting of ischemic cardiomyopathy.  I discussed the risks of the ICD implant, expected  recovery time and she wishes to proceed.  I will plan on implanting a Biotronik VDI ICD.  2.  SVT Patient continues to have episodes of symptomatic SVT with a heart rate of 170-180 beats per minute.  These are limiting her ability to participate in cardiac rehab.  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  I discussed EP study and ablation with the patient during today's appointment and she wishes to proceed with scheduling.  I will plan on performing this procedure at the same time as the ICD implant.  We will perform both procedures under MAC sedation provided by anesthesia.  Therapeutic strategies for supraventricular tachycardia including medicine and ablation were discussed in detail with the patient today. Risk, benefits, and alternatives to EP study and radiofrequency ablation were also discussed in detail today. These risks include but are not limited to stroke, bleeding, vascular damage, tamponade, perforation, damage to the heart and other structures, AV block requiring pacemaker, worsening renal function, and death. The patient understands these risk and wishes to proceed.  We will  therefore proceed with catheter ablation at the next available time.   Medication Adjustments/Labs and Tests Ordered: Current medicines are reviewed at length with the patient today.  Concerns regarding medicines are outlined above.   Tests Ordered: No orders of the defined types were placed in this encounter.   Medication Changes: No orders of the defined types were placed in this encounter.    Signed, Vickie Epley, MD  11/29/2020 6:49 PM    [facility]      ICD Criteria Current LVEF:33%. Within 12 months prior to implant: Yes  Heart failure history: Yes, Class III Cardiomyopathy history: Yes, Ischemic Cardiomyopathy - Prior MI. Atrial Fibrillation/Atrial Flutter: No. Ventricular tachycardia history: No. Cardiac arrest history: No. History of  syndromes with risk of sudden death: No. Previous ICD: No. Current ICD indication: Primary PPM indication: No. Class I or II Bradycardia indication present: No Beta Blocker therapy for 3 or more months: Yes, prescribed.  Ace Inhibitor/ARB therapy for 3 or more months: Yes, prescribed.    I have seen SHYLYNN BRUNING is a 55 y.o. femalepre-procedural and has been referred by Dr Audelia Acton for consideration of ICD implant for prevention of sudden death.  The patient's chart has been reviewed and they meet criteria for ICD implant.  I have had a thorough discussion with the patient reviewing options.  The patient and their family (if available) have had opportunities to ask questions and have them answered. The patient and I have decided together through the [place] Support Tool to implant ICD at this time.  Risks, benefits, alternatives to ICD implantation were discussed in detail with the patient today. The patient  understands that the risks include but are not limited to bleeding, infection, pneumothorax, perforation, tamponade, vascular damage, renal failure, MI, stroke, death, inappropriate shocks, and lead dislodgement and  wishes to proceed.

## 2020-11-29 NOTE — Telephone Encounter (Signed)
Pt is scheduled for sVT ablation and ICD implant  -confirm medication instructions -enter orders

## 2020-11-29 NOTE — Progress Notes (Addendum)
Virtual Visit via Telephone Note   This visit type was conducted due to national recommendations for restrictions regarding the COVID-19 Pandemic (e.g. social distancing) in an effort to limit this patient's exposure and mitigate transmission in our community.  Due to her co-morbid illnesses, this patient is at least at moderate risk for complications without adequate follow up.  This format is felt to be most appropriate for this patient at this time.  The patient did not have access to video technology/had technical difficulties with video requiring transitioning to audio format only (telephone).  All issues noted in this document were discussed and addressed.  No physical exam could be performed with this format.  Please refer to the patient's chart for her  consent to telehealth for [facility].    Date:  11/29/2020   ID:  Stacey Richardson, DOB 1966/05/05, MRN [id number] The patient was identified using 2 identifiers.  Patient Location: Home Provider Location: Office/Clinic   PCP:  Theotis Burrow, MD   [place]  Cardiologist:  [name], MD  Advanced Practice Provider:  No care team member to display Electrophysiologist:  Vickie Epley, MD   Chief Complaint: Follow-up recent studies.  History of Present Illness:    Stacey Richardson is a 55 y.o. female with a history of chronic systolic heart failure, coronary artery disease post bypass surgery and SVT who presents for follow-up.  I last saw the patient October 18, 2020.  During that appointment a loop recorder was implanted given history of cryptogenic stroke.  Since the implant of the loop recorder, the patient has had no episodes of atrial fibrillation but she has had continued episodes of SVT.  There are symptomatic with palpitations.  Today during the phone call she tells me she is doing about the same as last time we chatted.  She did tell me about the episode of SVT.  She has had no  further episodes of chest pain.  She is concerned about her overall health and is interested in proceeding with both the ICD and EP study.   Past Medical History:  Diagnosis Date  . CAD (coronary artery disease)   . CVA (cerebral vascular accident) ([place])   . Diabetes mellitus   . HFrEF (heart failure with reduced ejection fraction) ([place])   . HLD (hyperlipidemia)   . Hypertension    Past Surgical History:  Procedure Laterality Date  . CARDIAC CATHETERIZATION    . CLIPPING OF ATRIAL APPENDAGE N/A 08/06/2020   Procedure: CLIPPING OF ATRIAL APPENDAGE USING ATRICURE CLIP SIZE 40MM;  Surgeon: Wonda Olds, MD;  Location: [place];  Service: Open Heart Surgery;  Laterality: N/A;  . CORONARY ARTERY BYPASS GRAFT N/A 08/06/2020   Procedure: CORONARY ARTERY BYPASS GRAFTING (CABG) X2, USING BILATERAL INTERNAL MAMMARY ARTERIES;  Surgeon: Wonda Olds, MD;  Location: [place];  Service: Open Heart Surgery;  Laterality: N/A;  . KNEE SURGERY    . RIGHT/LEFT HEART CATH AND CORONARY ANGIOGRAPHY Bilateral 08/02/2020   Procedure: RIGHT/LEFT HEART CATH AND CORONARY ANGIOGRAPHY;  Surgeon: [name], MD;  Location: [place] [facility];  Service: Cardiovascular;  Laterality: Bilateral;  . TEE WITHOUT CARDIOVERSION N/A 07/18/2020   Procedure: TRANSESOPHAGEAL ECHOCARDIOGRAM (TEE);  Surgeon: [name], MD;  Location: [facility];  Service: Cardiovascular;  Laterality: N/A;  . TEE WITHOUT CARDIOVERSION N/A 08/06/2020   Procedure: TRANSESOPHAGEAL ECHOCARDIOGRAM (TEE);  Surgeon: Wonda Olds, MD;  Location: [place];  Service: Open Heart Surgery;  Laterality: N/A;     No outpatient medications have been marked as taking for the 11/29/20 encounter (Video Visit) with Vickie Epley, MD.     Allergies:   Peanut butter flavor and Lisinopril   Social History   Tobacco Use  . Smoking status: Former Research scientist (life sciences)  . Smokeless tobacco: Never Used  . Tobacco comment: Quit 30 years ago  Vaping Use  .  Vaping Use: Never used  Substance Use Topics  . Alcohol use: Yes    Comment: social  . Drug use: No     Family Hx: The patient's family history includes Heart disease in her mother; Heart failure in her brother; Hypertension in her brother.  ROS:   Please see the history of present illness.     All other systems reviewed and are negative.   Prior CV studies:   The following studies were reviewed today:  November 08, 2020 echo personally reviewed Left ventricular function severely decreased, 30% LV global longitudinal strain 12% No significant valvular abnormalities  Loop interrogation from November 20, 2020 personally reviewed shows no arrhythmias.  Labs/Other Tests and Data Reviewed:    EKG:  No ECG reviewed.  Recent Labs: 06/12/2020: TSH 1.251 07/20/2020: B Natriuretic Peptide 26.5 08/07/2020: Magnesium 2.2 10/08/2020: ALT 15 10/15/2020: Hemoglobin 9.8; Platelets 450 11/13/2020: BUN 12; Creatinine, Ser 0.60; Potassium 4.0; Sodium 142   Recent Lipid Panel Lab Results  Component Value Date/Time   CHOL 104 08/03/2020 12:42 AM   TRIG 60 08/03/2020 12:42 AM   HDL 32 (L) 08/03/2020 12:42 AM   CHOLHDL 3.3 08/03/2020 12:42 AM   LDLCALC 60 08/03/2020 12:42 AM    Wt Readings from Last 3 Encounters:  11/13/20 186 lb (84.4 kg)  10/18/20 185 lb 2 oz (84 kg)  10/15/20 183 lb (83 kg)     Risk Assessment/Calculations:      Objective:    Vital Signs:  LMP 08/22/2020 (Exact Date)    PSYCH:  normal affect  ASSESSMENT & PLAN:    1. Chronic systolic heart failure secondary to ischemic cardiomyopathy NYHA class II-III symptoms.  Warm and dry.  On good medical therapy.  Recommend she continue taking losartan, spironolactone, carvedilol, aspirin, atorvastatin.  We again discussed using an ICD to help protect against sudden cardiac death given her persistently reduced ejection fraction in the setting of ischemic cardiomyopathy.  I discussed the risks of the ICD implant, expected  recovery time and she wishes to proceed.  I will plan on implanting a Biotronik VDI ICD.  2.  SVT Patient continues to have episodes of symptomatic SVT with a heart rate of 170-180 beats per minute.  These are limiting her ability to participate in cardiac rehab.  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  I discussed EP study and ablation with the patient during today's appointment and she wishes to proceed with scheduling.  I will plan on performing this procedure at the same time as the ICD implant.  We will perform both procedures under MAC sedation provided by anesthesia.  Therapeutic strategies for supraventricular tachycardia including medicine and ablation were discussed in detail with the patient today. Risk, benefits, and alternatives to EP study and radiofrequency ablation were also discussed in detail today. These risks include but are not limited to stroke, bleeding, vascular damage, tamponade, perforation, damage to the heart and other structures, AV block requiring pacemaker, worsening renal function, and death. The patient understands these risk and wishes to proceed.  We will  therefore proceed with catheter ablation at the next available time.   Medication Adjustments/Labs and Tests Ordered: Current medicines are reviewed at length with the patient today.  Concerns regarding medicines are outlined above.   Tests Ordered: No orders of the defined types were placed in this encounter.   Medication Changes: No orders of the defined types were placed in this encounter.    Signed, Vickie Epley, MD  11/29/2020 6:49 PM    [facility]      ICD Criteria Current LVEF:33%. Within 12 months prior to implant: Yes  Heart failure history: Yes, Class III Cardiomyopathy history: Yes, Ischemic Cardiomyopathy - Prior MI. Atrial Fibrillation/Atrial Flutter: No. Ventricular tachycardia history: No. Cardiac arrest history: No. History of  syndromes with risk of sudden death: No. Previous ICD: No. Current ICD indication: Primary PPM indication: No. Class I or II Bradycardia indication present: No Beta Blocker therapy for 3 or more months: Yes, prescribed.  Ace Inhibitor/ARB therapy for 3 or more months: Yes, prescribed.    I have seen CHAMILLE WERNTZ is a 55 y.o. femalepre-procedural and has been referred by Dr Audelia Acton for consideration of ICD implant for prevention of sudden death.  The patient's chart has been reviewed and they meet criteria for ICD implant.  I have had a thorough discussion with the patient reviewing options.  The patient and their family (if available) have had opportunities to ask questions and have them answered. The patient and I have decided together through the [employer] to implant ICD at this time.  Risks, benefits, alternatives to ICD implantation were discussed in detail with the patient today. The patient  understands that the risks include but are not limited to bleeding, infection, pneumothorax, perforation, tamponade, vascular damage, renal failure, MI, stroke, death, inappropriate shocks, and lead dislodgement and  wishes to proceed.      Total time of encounter: 25 minutes total time of encounter, including face-to-face patient care, coordination of care and counseling regarding high complexity medical decision making re: ICD. Remainder of non-face-to-face time involved reviewing chart documents/testing relevant to the patient encounter and documentation in the medical record.

## 2020-11-29 NOTE — Progress Notes (Signed)
Carelink Summary Report / Loop Recorder 

## 2020-11-29 NOTE — Progress Notes (Signed)
Cardiac Individual Treatment Plan  Patient Details  Name: Stacey Richardson MRN: [id number] Date of Birth: 02/06/1966 Referring Provider:   Flowsheet Row Cardiac Rehab from 09/19/2020 in [facility] Cardiac and Pulmonary Rehab  Referring Provider End      Initial Encounter Date:  Flowsheet Row Cardiac Rehab from 09/19/2020 in [facility] Cardiac and Pulmonary Rehab  Date 09/19/20      Visit Diagnosis: S/P CABG x 2  Patient's Home Medications on Admission:  Current Outpatient Medications:  .  acetaminophen (TYLENOL) 500 MG tablet, Take 500 mg by mouth every 6 (six) hours as needed for moderate pain or headache., Disp: , Rfl:  .  aspirin EC 81 MG tablet, Take 81 mg by mouth daily. , Disp: , Rfl:  .  atorvastatin (LIPITOR) 80 MG tablet, Take 80 mg by mouth at bedtime., Disp: , Rfl:  .  carvedilol (COREG) 12.5 MG tablet, Take 1 tablet (12.5 mg total) by mouth 2 (two) times daily., Disp: 60 tablet, Rfl: 5 .  clopidogrel (PLAVIX) 75 MG tablet, Take 1 tablet (75 mg total) by mouth daily., Disp: 90 tablet, Rfl: 0 .  losartan (COZAAR) 25 MG tablet, Take 1 tablet (25 mg total) by mouth daily., Disp: 30 tablet, Rfl: 2 .  metFORMIN (GLUCOPHAGE-XR) 500 MG 24 hr tablet, Take 1,000 mg by mouth 2 (two) times daily., Disp: , Rfl:  .  spironolactone (ALDACTONE) 25 MG tablet, Take 0.5 tablets (12.5 mg total) by mouth daily., Disp: 45 tablet, Rfl: 1 .  tetrahydrozoline 0.05 % ophthalmic solution, Place 1-2 drops into both eyes 3 (three) times daily as needed (irritated eyes.)., Disp: , Rfl:   Past Medical History: Past Medical History:  Diagnosis Date  . CAD (coronary artery disease)   . CVA (cerebral vascular accident) ([place])   . Diabetes mellitus   . HFrEF (heart failure with reduced ejection fraction) ([place])   . HLD (hyperlipidemia)   . Hypertension     Tobacco Use: Social History   Tobacco Use  Smoking Status Former Smoker  Smokeless Tobacco Never Used  Tobacco Comment   Quit 30 years ago     Labs: Recent Chemical engineer    Labs for ITP Cardiac and Pulmonary Rehab Latest Ref Rng & Units 08/06/2020 08/06/2020 08/06/2020 08/07/2020 08/08/2020   Cholestrol 0 - 200 mg/dL - - - - -   LDLCALC 0 - 99 mg/dL - - - - -   HDL >40 mg/dL - - - - -   Trlycerides <150 mg/dL - - - - -   Hemoglobin A1c 4.8 - 5.6 % - - - - -   PHART 7.350 - 7.450 7.380 7.328(L) 7.321(L) - -   PCO2ART 32.0 - 48.0 mmHg 37.4 37.0 38.5 - -   HCO3 20.0 - 28.0 mmol/L 22.3 19.4(L) 19.9(L) - -   TCO2 22 - 32 mmol/L 23 20(L) 21(L) - -   ACIDBASEDEF 0.0 - 2.0 mmol/L 3.0(H) 6.0(H) 6.0(H) - -   O2SAT % 100.0 99.0 99.0 74.6 62.1       Exercise Target Goals: Exercise Program Goal: Individual exercise prescription set using results from initial 6 min walk test and THRR while considering  patient's activity barriers and safety.   Exercise Prescription Goal: Initial exercise prescription builds to 30-45 minutes a day of aerobic activity, 2-3 days per week.  Home exercise guidelines will be given to patient during program as part of exercise prescription that the participant will acknowledge.   Education: Aerobic Exercise: - Group verbal and  visual presentation on the components of exercise prescription. Introduces F.I.T.T principle from ACSM for exercise prescriptions.  Reviews F.I.T.T. principles of aerobic exercise including progression. Written material given at graduation.   Education: Resistance Exercise: - Group verbal and visual presentation on the components of exercise prescription. Introduces F.I.T.T principle from ACSM for exercise prescriptions  Reviews F.I.T.T. principles of resistance exercise including progression. Written material given at graduation.    Education: Exercise & Equipment Safety: - Individual verbal instruction and demonstration of equipment use and safety with use of the equipment. Flowsheet Row Cardiac Rehab from 11/15/2020 in [facility] Cardiac and Pulmonary Rehab  Date 09/19/20   Educator AS  Instruction Review Code 1- Verbalizes Understanding      Education: Exercise Physiology & General Exercise Guidelines: - Group verbal and written instruction with models to review the exercise physiology of the cardiovascular system and associated critical values. Provides general exercise guidelines with specific guidelines to those with heart or lung disease.    Education: Flexibility, Balance, Mind/Body Relaxation: - Group verbal and visual presentation with interactive activity on the components of exercise prescription. Introduces F.I.T.T principle from ACSM for exercise prescriptions. Reviews F.I.T.T. principles of flexibility and balance exercise training including progression. Also discusses the mind body connection.  Reviews various relaxation techniques to help reduce and manage stress (i.e. Deep breathing, progressive muscle relaxation, and visualization). Balance handout provided to take home. Written material given at graduation. Flowsheet Row Cardiac Rehab from 11/15/2020 in [facility] Cardiac and Pulmonary Rehab  Date 11/08/20  Educator AS  Instruction Review Code 1- Verbalizes Understanding      Activity Barriers & Risk Stratification:  Activity Barriers & Cardiac Risk Stratification - 09/11/20 1142      Activity Barriers & Cardiac Risk Stratification   Activity Barriers None    Cardiac Risk Stratification Moderate           6 Minute Walk:  6 Minute Walk    Row Name 09/19/20 1453         6 Minute Walk   Phase Initial     Distance 1365 feet     Walk Time 6 minutes     # of Rest Breaks 0     MPH 2.58     METS 4.5     RPE 11     Perceived Dyspnea  2     VO2 Peak 15.77     Symptoms No     Resting HR 91 bpm     Resting BP 110/66     Resting Oxygen Saturation  99 %     Exercise Oxygen Saturation  during 6 min walk 98 %     Max Ex. HR 141 bpm     Max Ex. BP 154/82     2 Minute Post BP 116/68            Oxygen Initial Assessment:   Oxygen  Re-Evaluation:   Oxygen Discharge (Final Oxygen Re-Evaluation):   Initial Exercise Prescription:  Initial Exercise Prescription - 09/19/20 1400      Date of Initial Exercise RX and Referring Provider   Date 09/19/20    Referring Provider End      Treadmill   MPH 2.6    Grade 2.5    Minutes 15    METs 3.9      Recumbant Bike   Level 2    RPM 60    Watts 50    Minutes 15    METs 4  NuStep   Level 3    SPM 80    Minutes 15    METs 4      REL-XR   Level 2    Speed 50    Minutes 15    METs 4      T5 Nustep   Level 2    SPM 80    Minutes 15    METs 4      Prescription Details   Frequency (times per week) 3    Duration Progress to 30 minutes of continuous aerobic without signs/symptoms of physical distress      Intensity   THRR 40-80% of Max Heartrate 121-152    Ratings of Perceived Exertion 11-13    Perceived Dyspnea 0-4      Resistance Training   Training Prescription Yes    Weight 3 lb    Reps 10-15           Perform Capillary Blood Glucose checks as needed.  Exercise Prescription Changes:  Exercise Prescription Changes    Row Name 09/19/20 1400 10/04/20 1500 11/01/20 1100 11/15/20 1200       Response to Exercise   Blood Pressure (Admit) 110/66 122/70 110/62 126/74    Blood Pressure (Exercise) 154/82 152/66 172/70 152/62    Blood Pressure (Exit) 116/68 110/66 110/60 106/60    Heart Rate (Admit) 91 bpm 100 bpm 109 bpm 104 bpm    Heart Rate (Exercise) 141 bpm 134 bpm 128 bpm 147 bpm    Heart Rate (Exit) 90 bpm 109 bpm 102 bpm 88 bpm    Oxygen Saturation (Admit) 99 % -- -- --    Oxygen Saturation (Exercise) 98 % -- -- --    Rating of Perceived Exertion (Exercise) 11 14 13 16     Perceived Dyspnea (Exercise) 2 -- -- --    Symptoms none none none none    Duration -- Continue with 30 min of aerobic exercise without signs/symptoms of physical distress. Continue with 30 min of aerobic exercise without signs/symptoms of physical distress.  Continue with 30 min of aerobic exercise without signs/symptoms of physical distress.    Intensity -- THRR unchanged THRR unchanged THRR unchanged         Progression   Progression -- Continue to progress workloads to maintain intensity without signs/symptoms of physical distress. Continue to progress workloads to maintain intensity without signs/symptoms of physical distress. Continue to progress workloads to maintain intensity without signs/symptoms of physical distress.    Average METs -- 2.85 2.45 3.26         Resistance Training   Training Prescription -- Yes Yes Yes    Weight -- 3 lb 3 lb 4 lb    Reps -- 10-15 10-15 10-15         Interval Training   Interval Training -- No No No         Treadmill   MPH -- 2.6 2.4 2.6    Grade -- 2.5 2 2     Minutes -- 15 15 15     METs -- 3.89 3.5 3.89         Recumbant Elliptical   Level -- -- 1 --    Minutes -- -- 15 --    METs -- -- 1.4 --         T5 Nustep   Level -- 2 -- --    Minutes -- 15 -- --    METs -- 1.8 -- --  Exercise Comments:  Exercise Comments    Row Name 09/21/20 1458           Exercise Comments First full day of exercise!  Patient was oriented to gym and equipment including functions, settings, policies, and procedures.  Patient's individual exercise prescription and treatment plan were reviewed.  All starting workloads were established based on the results of the 6 minute walk test done at initial orientation visit.  The plan for exercise progression was also introduced and progression will be customized based on patient's performance and goals.              Exercise Goals and Review:  Exercise Goals    Row Name 09/19/20 1458             Exercise Goals   Increase Physical Activity Yes       Intervention Provide advice, education, support and counseling about physical activity/exercise needs.;Develop an individualized exercise prescription for aerobic and resistive training based on  initial evaluation findings, risk stratification, comorbidities and participant's personal goals.       Expected Outcomes Short Term: Attend rehab on a regular basis to increase amount of physical activity.;Long Term: Add in home exercise to make exercise part of routine and to increase amount of physical activity.;Long Term: Exercising regularly at least 3-5 days a week.       Increase Strength and Stamina Yes       Intervention Provide advice, education, support and counseling about physical activity/exercise needs.;Develop an individualized exercise prescription for aerobic and resistive training based on initial evaluation findings, risk stratification, comorbidities and participant's personal goals.       Expected Outcomes Short Term: Increase workloads from initial exercise prescription for resistance, speed, and METs.;Short Term: Perform resistance training exercises routinely during rehab and add in resistance training at home;Long Term: Improve cardiorespiratory fitness, muscular endurance and strength as measured by increased METs and functional capacity (6MWT)       Able to understand and use rate of perceived exertion (RPE) scale Yes       Intervention Provide education and explanation on how to use RPE scale       Expected Outcomes Short Term: Able to use RPE daily in rehab to express subjective intensity level;Long Term:  Able to use RPE to guide intensity level when exercising independently       Able to understand and use Dyspnea scale Yes       Intervention Provide education and explanation on how to use Dyspnea scale       Expected Outcomes Short Term: Able to use Dyspnea scale daily in rehab to express subjective sense of shortness of breath during exertion;Long Term: Able to use Dyspnea scale to guide intensity level when exercising independently       Knowledge and understanding of Target Heart Rate Range (THRR) Yes       Intervention Provide education and explanation of THRR  including how the numbers were predicted and where they are located for reference       Expected Outcomes Short Term: Able to state/look up THRR;Short Term: Able to use daily as guideline for intensity in rehab;Long Term: Able to use THRR to govern intensity when exercising independently       Able to check pulse independently Yes       Intervention Provide education and demonstration on how to check pulse in carotid and radial arteries.;Review the importance of being able to check your own pulse for safety during independent  exercise       Expected Outcomes Short Term: Able to explain why pulse checking is important during independent exercise;Long Term: Able to check pulse independently and accurately       Understanding of Exercise Prescription Yes       Intervention Provide education, explanation, and written materials on patient's individual exercise prescription       Expected Outcomes Short Term: Able to explain program exercise prescription;Long Term: Able to explain home exercise prescription to exercise independently              Exercise Goals Re-Evaluation :  Exercise Goals Re-Evaluation    Row Name 09/21/20 1458 10/04/20 1526 10/30/20 1409 11/01/20 1140 11/15/20 1211     Exercise Goal Re-Evaluation   Exercise Goals Review Able to understand and use rate of perceived exertion (RPE) scale;Able to understand and use Dyspnea scale;Knowledge and understanding of Target Heart Rate Range (THRR);Understanding of Exercise Prescription Increase Physical Activity;Increase Strength and Stamina;Understanding of Exercise Prescription Increase Physical Activity;Increase Strength and Stamina Increase Physical Activity;Increase Strength and Stamina;Understanding of Exercise Prescription Increase Physical Activity;Increase Strength and Stamina   Comments Reviewed RPE and dyspnea scales, THR and program prescription with pt today.  Pt voiced understanding and was given a copy of goals to take home.  Stacey Richardson is off to a good start to rehab.  She has completed her first two full days of rehab.  We will continue to monitor her progress. Stacey Richardson has a TM at home.  Staff have not reviewed home exercise yet. Stacey Richardson is doing well in rehab.  Her attendance has gotten better recently.  We will review home exercise guideline with her.  She is up to 2.6 mph on the treadmill.  We will continue to monitor her progress. Stacey Richardson is progressing well. She has incresaed speed on TM and weight to 4 lb for strength work.  We will continue to monitor progress.   Expected Outcomes Short: Use RPE daily to regulate intensity. Long: Follow program prescription in THR. Short: Continue to attend regularly Long: Continue to follow program prescription Short:  review home exercise with staff Long:  improve stamina Short:  review home exercise with staff Long:  improve stamina Short: contiue to attend consistently Long: improve overall stamina          Discharge Exercise Prescription (Final Exercise Prescription Changes):  Exercise Prescription Changes - 11/15/20 1200      Response to Exercise   Blood Pressure (Admit) 126/74    Blood Pressure (Exercise) 152/62    Blood Pressure (Exit) 106/60    Heart Rate (Admit) 104 bpm    Heart Rate (Exercise) 147 bpm    Heart Rate (Exit) 88 bpm    Rating of Perceived Exertion (Exercise) 16    Symptoms none    Duration Continue with 30 min of aerobic exercise without signs/symptoms of physical distress.    Intensity THRR unchanged      Progression   Progression Continue to progress workloads to maintain intensity without signs/symptoms of physical distress.    Average METs 3.26      Resistance Training   Training Prescription Yes    Weight 4 lb    Reps 10-15      Interval Training   Interval Training No      Treadmill   MPH 2.6    Grade 2    Minutes 15    METs 3.89           Nutrition:  Target Goals: Understanding  of nutrition guidelines, daily intake of  sodium 1500mg , cholesterol 200mg , calories 30% from fat and 7% or less from saturated fats, daily to have 5 or more servings of fruits and vegetables.  Education: All About Nutrition: -Group instruction provided by verbal, written material, interactive activities, discussions, models, and posters to present general guidelines for heart healthy nutrition including fat, fiber, MyPlate, the role of sodium in heart healthy nutrition, utilization of the nutrition label, and utilization of this knowledge for meal planning. Follow up email sent as well. Written material given at graduation. Flowsheet Row Cardiac Rehab from 11/15/2020 in [facility] Cardiac and Pulmonary Rehab  Date 11/15/20  Educator [facility]  Instruction Review Code 1- Verbalizes Understanding      Biometrics:  Pre Biometrics - 09/19/20 1459      Pre Biometrics   Height 5' 6.5" (1.689 m)    Weight 186 lb (84.4 kg)    BMI (Calculated) 29.57    Single Leg Stand 19.26 seconds            Nutrition Therapy Plan and Nutrition Goals:  Nutrition Therapy & Goals - 10/30/20 1439      Nutrition Therapy   Diet Heart healhty, low Na, diabetes friendly    Drug/Food Interactions Statins/Certain Fruits    Protein (specify units) 65g    Fiber 25 grams    Whole Grain Foods 3 servings    Saturated Fats 12 max. grams    Fruits and Vegetables 8 servings/day    Sodium 1.5 grams      Personal Nutrition Goals   Nutrition Goal ST: practice counting carbohydrates, use MyPlate structure and count carbohydrates (3-4 servings for meals, 1-2 for snacks) LT: Be independent managing diabetes    Comments 2 meals - lunch and dinner. Sometimes will have boiled egg and toast (white wheat) - she likes whole wheat bread. L: tuna or cheese toast (whole wheat) D: take out - tries to get baked chicken, mashed poatoes, and sweet potatoes, brussells sprouts. Discussed heart healthy eating and diabetes friendly eating. Discussed which foods were CHO servings and how to  count CHOs servings.      Intervention Plan   Intervention Prescribe, educate and counsel regarding individualized specific dietary modifications aiming towards targeted core components such as weight, hypertension, lipid management, diabetes, heart failure and other comorbidities.;Nutrition handout(s) given to patient.    Expected Outcomes Short Term Goal: Understand basic principles of dietary content, such as calories, fat, sodium, cholesterol and nutrients.;Short Term Goal: A plan has been developed with personal nutrition goals set during dietitian appointment.;Long Term Goal: Adherence to prescribed nutrition plan.           Nutrition Assessments:  MEDIFICTS Score Key:  ?70 Need to make dietary changes   40-70 Heart Healthy Diet  ? 40 Therapeutic Level Cholesterol Diet  Flowsheet Row Cardiac Rehab from 09/19/2020 in [facility] Cardiac and Pulmonary Rehab  Picture Your Plate Total Score on Admission 44     Picture Your Plate Scores:  <16 Unhealthy dietary pattern with much room for improvement.  41-50 Dietary pattern unlikely to meet recommendations for good health and room for improvement.  51-60 More healthful dietary pattern, with some room for improvement.   >60 Healthy dietary pattern, although there may be some specific behaviors that could be improved.    Nutrition Goals Re-Evaluation:  Nutrition Goals Re-Evaluation    [place] Name 11/15/20 1449             Goals   Nutrition Goal ST:  practice counting carbohydrates, use MyPlate structure and count carbohydrates (3-4 servings for meals, 1-2 for snacks) LT: Be independent managing diabetes       Comment Stacey Richardson hasn't been able to work on her goals set with the dietician due to increase of stress in her life. She reports wanting to start working on these goals now and already has some meal plans in place.       Expected Outcome Short: incorporate the goals set with the dietician of counting carbs. Long: maintain  independence with managing heart healthy diet              Nutrition Goals Discharge (Final Nutrition Goals Re-Evaluation):  Nutrition Goals Re-Evaluation - 11/15/20 1449      Goals   Nutrition Goal ST: practice counting carbohydrates, use MyPlate structure and count carbohydrates (3-4 servings for meals, 1-2 for snacks) LT: Be independent managing diabetes    Comment Stacey Richardson hasn't been able to work on her goals set with the dietician due to increase of stress in her life. She reports wanting to start working on these goals now and already has some meal plans in place.    Expected Outcome Short: incorporate the goals set with the dietician of counting carbs. Long: maintain independence with managing heart healthy diet           Psychosocial: Target Goals: Acknowledge presence or absence of significant depression and/or stress, maximize coping skills, provide positive support system. Participant is able to verbalize types and ability to use techniques and skills needed for reducing stress and depression.   Education: Stress, Anxiety, and Depression - Group verbal and visual presentation to define topics covered.  Reviews how body is impacted by stress, anxiety, and depression.  Also discusses healthy ways to reduce stress and to treat/manage anxiety and depression.  Written material given at graduation.   Education: Sleep Hygiene -Provides group verbal and written instruction about how sleep can affect your health.  Define sleep hygiene, discuss sleep cycles and impact of sleep habits. Review good sleep hygiene tips.    Initial Review & Psychosocial Screening:  Initial Psych Review & Screening - 09/11/20 1145      Initial Review   Current issues with Current Anxiety/Panic;Current Stress Concerns    Source of Stress Concerns Chronic Illness    Comments Stress over what her surgery and her recovery will do to her Diabetes, getting back to work safely and understanding all that  happened.      Family Dynamics   Good Support System? Yes   daughter, brothers.     Barriers   Psychosocial barriers to participate in program There are no identifiable barriers or psychosocial needs.      Screening Interventions   Interventions Encouraged to exercise;To provide support and resources with identified psychosocial needs    Expected Outcomes Short Term goal: Utilizing psychosocial counselor, staff and physician to assist with identification of specific Stressors or current issues interfering with healing process. Setting desired goal for each stressor or current issue identified.;Long Term Goal: Stressors or current issues are controlled or eliminated.;Short Term goal: Identification and review with participant of any Quality of Life or Depression concerns found by scoring the questionnaire.;Long Term goal: The participant improves quality of Life and PHQ9 Scores as seen by post scores and/or verbalization of changes           Quality of Life Scores:   Quality of Life - 09/19/20 1500      Quality of Life  Select Quality of Life      Quality of Life Scores   Health/Function Pre 21.8 %    Socioeconomic Pre 17.44 %    Psych/Spiritual Pre 24 %    Family Pre 25.8 %    GLOBAL Pre 21.8 %          Scores of 19 and below usually indicate a poorer quality of life in these areas.  A difference of  2-3 points is a clinically meaningful difference.  A difference of 2-3 points in the total score of the Quality of Life Index has been associated with significant improvement in overall quality of life, self-image, physical symptoms, and general health in studies assessing change in quality of life.  PHQ-9: Recent Review Flowsheet Data    Depression screen [facility] 2/9 09/19/2020   Decreased Interest 1   Down, Depressed, Hopeless 1   PHQ - 2 Score 2   Altered sleeping 1   Tired, decreased energy 1   Change in appetite 1   Feeling bad or failure about yourself  0   Trouble  concentrating 0   Moving slowly or fidgety/restless 0   Suicidal thoughts 0   PHQ-9 Score 5   Difficult doing work/chores Not difficult at all     Interpretation of Total Score  Total Score Depression Severity:  1-4 = Minimal depression, 5-9 = Mild depression, 10-14 = Moderate depression, 15-19 = Moderately severe depression, 20-27 = Severe depression   Psychosocial Evaluation and Intervention:  Psychosocial Evaluation - 09/11/20 1202      Psychosocial Evaluation & Interventions   Interventions Encouraged to exercise with the program and follow exercise prescription;Relaxation education    Comments Stacey Richardson has no barriers to entry to the program. She has some anxiety about her chest "opening up if she pushes to hard". Spoke to her that we have never seen this happen here as  we will start slow and work until she feels healed before increasing the her workloads.  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 She  has a good support system with her daughter and her 2 brothers. She is ready to get started and learn about her heart disease, learn the exercise and nutriton steps to lead to a healthy lifestyle and continue to work on managing her diabetes. Stacey Richardson should do well in the program.    Expected Outcomes STG: Attends all scheduled sessions:exercise and education. Continues to verbalize any stress or fears   LTG: Stacey Richardson able to utilize what she has learned to maintain a healthy lifestyle and has been able to diminish her stress and fears about her heart disease .    Continue Psychosocial Services  Follow up required by staff           Psychosocial Re-Evaluation:  Psychosocial Re-Evaluation    Stacey [place] Name 10/30/20 1400 11/15/20 1438           Psychosocial Re-Evaluation   Current issues with Current Stress  Concerns;Current Sleep Concerns Current Stress Concerns;Current Sleep Concerns      Comments Stacey Richardson is still stressed out some about her health.  She feels good about attending HT and the education.  She doesnt want to go back to work yet.  Staff suggested she talk to HR at her employer  about disability.  She sleeps well sometimes - she does have a prescirption for sleep medication.  She will ask Dr about melatonin. Stacey Richardson's anxiety has been hightened these last few weeks as she has gone to her follow up doctor appointments and didn't receive the news she was hoping. Her EF has stayed the same at 30-35% which means they are talking about placing an ICD and trialing new medications. They already placed a loop recorder and she continues ot have bouts of SVT. She is very anxious about her prognosis and feels like she has been given a death sentence. She wants to do anything she can to help her live a long life. Information was given by staff and by her cardiologist about ICD and heart failure. She found a new work from home job and starts training next week. She is hoping she can manage that schedule. Her sleep has taken a hit given all the stress, she has been scared to try the clonidine her MD gave her but may try 1/2 a pill since she is so tired from stressing and not sleeping. Her family is supportive, although she states one of her brothers can be a little much when it comes to giving her advice. She is too scared to seek out a personal relationship because she doesn't know what her prognosis is.      Expected Outcomes Short:  follow up with Dr about sleep meds Long: develop good stress management and sleep habits Short: follow up with EP on 3/23 to discuss ICD placement and options, try taking medication to sleep. Long: develop and maintain stress management techniques while continuing to become educated on her diagnosis      Interventions -- Relaxation education;Encouraged to attend Cardiac Rehabilitation  for the exercise;Stress management education      Continue Psychosocial Services  -- Follow up required by staff             Psychosocial Discharge (Final Psychosocial Re-Evaluation):  Psychosocial Re-Evaluation - 11/15/20 1438      Psychosocial Re-Evaluation   Current issues with Current Stress Concerns;Current Sleep Concerns    Comments Anber's anxiety has been hightened these last few weeks as she has gone to her follow up doctor appointments and didn't receive the news she was hoping. Her EF has stayed the same at 30-35% which means they are talking about placing an ICD and trialing new medications. They already placed a loop recorder and she continues ot have bouts of SVT. She is very anxious about her prognosis and feels like she has been given a death sentence. She wants to do anything she can to help her live a long life. Information was given by staff and by her cardiologist about ICD and heart failure. She found a new work from home job and starts training next week. She is hoping she can manage that schedule. Her sleep has taken a hit given all the stress, she has been scared to try the clonidine her MD gave her but may try 1/2 a pill since she is so tired from stressing and not sleeping. Her family is supportive, although she states one of her brothers can be a little much when it comes to giving her advice. She is too scared to seek out a personal relationship because she doesn't know what her prognosis is.    Expected Outcomes Short: follow up with EP on 3/23 to discuss ICD placement and options, try taking medication to sleep. Long: develop and maintain stress  management techniques while continuing to become educated on her diagnosis    Interventions Relaxation education;Encouraged to attend Cardiac Rehabilitation for the exercise;Stress management education    Continue Psychosocial Services  Follow up required by staff           Vocational Rehabilitation: Provide vocational  rehab assistance to qualifying candidates.   Vocational Rehab Evaluation & Intervention:  Vocational Rehab - 09/11/20 1153      Initial Vocational Rehab Evaluation & Intervention   Assessment shows need for Vocational Rehabilitation No           Education: Education Goals: Education classes will be provided on a variety of topics geared toward better understanding of heart health and risk factor modification. Participant will state understanding/return demonstration of topics presented as noted by education test scores.  Learning Barriers/Preferences:  Learning Barriers/Preferences - 09/11/20 1152      Learning Barriers/Preferences   Learning Barriers None    Learning Preferences Video           General Cardiac Education Topics:  AED/CPR: - Group verbal and written instruction with the use of models to demonstrate the basic use of the AED with the basic ABC's of resuscitation.   Anatomy and Cardiac Procedures: - Group verbal and visual presentation and models provide information about basic cardiac anatomy and function. Reviews the testing methods done to diagnose heart disease and the outcomes of the test results. Describes the treatment choices: Medical Management, Angioplasty, or Coronary Bypass Surgery for treating various heart conditions including Myocardial Infarction, Angina, Valve Disease, and Cardiac Arrhythmias.  Written material given at graduation.   Medication Safety: - Group verbal and visual instruction to review commonly prescribed medications for heart and lung disease. Reviews the medication, class of the drug, and side effects. Includes the steps to properly store meds and maintain the prescription regimen.  Written material given at graduation.   Intimacy: - Group verbal instruction through game format to discuss how heart and lung disease can affect sexual intimacy. Written material given at graduation..   Know Your Numbers and Heart Failure: - Group  verbal and visual instruction to discuss disease risk factors for cardiac and pulmonary disease and treatment options.  Reviews associated critical values for Overweight/Obesity, Hypertension, Cholesterol, and Diabetes.  Discusses basics of heart failure: signs/symptoms and treatments.  Introduces Heart Failure Zone chart for action plan for heart failure.  Written material given at graduation.   Infection Prevention: - Provides verbal and written material to individual with discussion of infection control including proper hand washing and proper equipment cleaning during exercise session. Flowsheet Row Cardiac Rehab from 11/15/2020 in [facility] Cardiac and Pul[facility] Rehab  Date 09/19/20  Educator AS  Instruction Review Code 1- Verbalizes Understanding      Falls Prevention: - Provides verbal and written material to individual with discussion of falls prevention and safety. Flowsheet Row Cardiac Rehab from 11/15/2020 in [facility] Cardiac and Pulmonary Rehab  Date 09/19/20  Educator AS  Instruction Review Code 1- Verbalizes Understanding      Other: -Provides group and verbal instruction on various topics (see comments)   Knowledge Questionnaire Score:  Knowledge Questionnaire Score - 09/19/20 1501      Knowledge Questionnaire Score   Pre Score 23/26 angina nutrition           Core Components/Risk Factors/Patient Goals at Admission:  Personal Goals and Risk Factors at Admission - 09/19/20 1459      Core Components/Risk Factors/Patient Goals on Admission    Weight  Management Weight Loss;Yes    Intervention Weight Management/Obesity: Establish reasonable short term and long term weight goals.;Weight Management: Develop a combined nutrition and exercise program designed to reach desired caloric intake, while maintaining appropriate intake of nutrient and fiber, sodium and fats, and appropriate energy expenditure required for the weight goal.;Weight Management: Provide education and appropriate  resources to help participant work on and attain dietary goals.    Admit Weight 186 lb (84.4 kg)    Goal Weight: Short Term 180 lb (81.6 kg)    Goal Weight: Long Term 160 lb (72.6 kg)    Expected Outcomes Short Term: Continue to assess and modify interventions until short term weight is achieved;Long Term: Adherence to nutrition and physical activity/exercise program aimed toward attainment of established weight goal;Weight Loss: Understanding of general recommendations for a balanced deficit meal plan, which promotes 1-2 lb weight loss per week and includes a negative energy balance of [phone] kcal/d    Diabetes Yes    Intervention Provide education about signs/symptoms and action to take for hypo/hyperglycemia.;Provide education about proper nutrition, including hydration, and aerobic/resistive exercise prescription along with prescribed medications to achieve blood glucose in normal ranges: Fasting glucose 65-99 mg/dL    Expected Outcomes Short Term: Participant verbalizes understanding of the signs/symptoms and immediate care of hyper/hypoglycemia, proper foot care and importance of medication, aerobic/resistive exercise and nutrition plan for blood glucose control.;Long Term: Attainment of HbA1C < 7%.    Intervention Provide education on lifestyle modifcations including regular physical activity/exercise, weight management, moderate sodium restriction and increased consumption of fresh fruit, vegetables, and low fat dairy, alcohol moderation, and smoking cessation.;Monitor prescription use compliance.    Expected Outcomes Short Term: Continued assessment and intervention until BP is < 140/58mm HG in hypertensive participants. < 130/26mm HG in hypertensive participants with diabetes, heart failure or chronic kidney disease.;Long Term: Maintenance of blood pressure at goal levels.    Lipids Yes    Intervention Provide education and support for participant on nutrition & aerobic/resistive exercise  along with prescribed medications to achieve LDL 70mg , HDL >40mg .    Expected Outcomes Short Term: Participant states understanding of desired cholesterol values and is compliant with medications prescribed. Participant is following exercise prescription and nutrition guidelines.;Long Term: Cholesterol controlled with medications as prescribed, with individualized exercise RX and with personalized nutrition plan. Value goals: LDL < 70mg , HDL > 40 mg.           Education:Diabetes - Individual verbal and written instruction to review signs/symptoms of diabetes, desired ranges of glucose level fasting, after meals and with exercise. Acknowledge that pre and post exercise glucose checks will be done for 3 sessions at entry of program. [place] from 11/15/2020 in [facility] Cardiac and Pulmonary Rehab  Date 09/19/20  Educator AS  Instruction Review Code 1- Verbalizes Understanding      Core Components/Risk Factors/Patient Goals Review:   Goals and Risk Factor Review    Row Name 10/30/20 1356 11/15/20 1429           Core Components/Risk Factors/Patient Goals Review   Personal Goals Review Weight Management/Obesity;Hypertension;Lipids;Diabetes Weight Management/Obesity;Hypertension;Diabetes;Heart Failure      Review Stacey Richardson is monitoring BP and BG at home.  Her normal FBG is around 90-95.  She also monitors BP at home .  Today was 110/62 at [facility].  She is taking all medications as directed. Tanyia's BP is stable and is monitoring it while the cardiologist is trialing medications for her heart failure. Her diabetes is being  managed well, so well that her MD has decreased her diabetic medication. She recently had a repeat echo and her EF was 30-35%, which was unchanged. Since it did not improve and she is still having runs of SVT they are talking with her about getting an ICD and has appointment with the electrophysiologist      Expected Outcomes Short: continue to monitor vitals at home  Long:  manage risk factors long term Short: attend EP appointment to discuss ICD on 3/23 Long: continue managing BP and Diabetes, work with MD on plan for HF             Core Components/Risk Factors/Patient Goals at Discharge (Final Review):   Goals and Risk Factor Review - 11/15/20 1429      Core Components/Risk Factors/Patient Goals Review   Personal Goals Review Weight Management/Obesity;Hypertension;Diabetes;Heart Failure    Review Stacey Richardson's BP is stable and is monitoring it while the cardiologist is trialing medications for her heart failure. Her diabetes is being managed well, so well that her MD has decreased her diabetic medication. She recently had a repeat echo and her EF was 30-35%, which was unchanged. Since it did not improve and she is still having runs of SVT they are talking with her about getting an ICD and has appointment with the electrophysiologist    Expected Outcomes Short: attend EP appointment to discuss ICD on 3/23 Long: continue managing BP and Diabetes, work with MD on plan for HF           ITP Comments:  ITP Comments    Row Name 09/11/20 1222 09/19/20 1506 09/21/20 1458 10/04/20 0858 10/19/20 1458   ITP Comments Virtual orientation call completed today. shehas an appointment on Date: 09/13/2020  for EP eval and gym Orientation.  Documentation of diagnosis [facility] be found in [facility] Date: 08/02/2020. Completed 6MWT and gym orientation. Initial ITP created and sent for review to Dr. Emily Filbert, Medical Director. First full day of exercise!  Patient was oriented to gym and equipment including functions, settings, policies, and procedures.  Patient's individual exercise prescription and treatment plan were reviewed.  All starting workloads were established based on the results of the 6 minute walk test done at initial orientation visit.  The plan for exercise progression was also introduced and progression will be customized based on patient's performance and goals. 30 Day  review completed. Medical Director ITP review done, changes made as directed, and signed approval by Medical Director.  New to program Zamyah stopped by today to inform staff she had a loop recorder placed yesterday and she didn't feel comfortable exercising. She was nervous about it and everthing that is going on with her heart. Information and support given to her as she tries to figure everything out. She plans to return Monday as she really enjoys the program. She was encouraged to reach out to her doctor and support staff with further questions regarding the loop recorder and that more education on a heart healthy lifestyle will be given while in the program.   [place] Name 11/01/20 0620 11/29/20 0727         ITP Comments 30 Day review completed. Medical Director ITP review done, changes made as directed, and signed approval by Medical Director. 30 Day review completed. Medical Director ITP review done, changes made as directed, and signed approval by Medical Director.             Comments:

## 2020-12-01 ENCOUNTER — Other Ambulatory Visit: Payer: Self-pay

## 2020-12-01 ENCOUNTER — Encounter (HOSPITAL_COMMUNITY): Payer: Self-pay | Admitting: Emergency Medicine

## 2020-12-01 ENCOUNTER — Emergency Department (HOSPITAL_COMMUNITY)
Admission: EM | Admit: 2020-12-01 | Discharge: 2020-12-01 | Disposition: A | Discharge status: Home / Self Care | Payer: 59 | Attending: Emergency Medicine | Admitting: Emergency Medicine

## 2020-12-01 DIAGNOSIS — I5020 Unspecified systolic (congestive) heart failure: Secondary | ICD-10-CM

## 2020-12-01 DIAGNOSIS — I2510 Atherosclerotic heart disease of native coronary artery without angina pectoris: Secondary | ICD-10-CM | POA: Insufficient documentation

## 2020-12-01 DIAGNOSIS — Z7902 Long term (current) use of antithrombotics/antiplatelets: Secondary | ICD-10-CM | POA: Insufficient documentation

## 2020-12-01 DIAGNOSIS — Z794 Long term (current) use of insulin: Secondary | ICD-10-CM | POA: Diagnosis not present

## 2020-12-01 DIAGNOSIS — E119 Type 2 diabetes mellitus without complications: Secondary | ICD-10-CM | POA: Insufficient documentation

## 2020-12-01 DIAGNOSIS — I110 Hypertensive heart disease with heart failure: Secondary | ICD-10-CM | POA: Diagnosis not present

## 2020-12-01 DIAGNOSIS — Z87891 Personal history of nicotine dependence: Secondary | ICD-10-CM | POA: Insufficient documentation

## 2020-12-01 DIAGNOSIS — R42 Dizziness and giddiness: Secondary | ICD-10-CM

## 2020-12-01 DIAGNOSIS — Z7982 Long term (current) use of aspirin: Secondary | ICD-10-CM | POA: Insufficient documentation

## 2020-12-01 DIAGNOSIS — D509 Iron deficiency anemia, unspecified: Secondary | ICD-10-CM | POA: Diagnosis not present

## 2020-12-01 DIAGNOSIS — I5033 Acute on chronic diastolic (congestive) heart failure: Secondary | ICD-10-CM | POA: Diagnosis not present

## 2020-12-01 DIAGNOSIS — Z951 Presence of aortocoronary bypass graft: Secondary | ICD-10-CM | POA: Insufficient documentation

## 2020-12-01 LAB — CBC WITH DIFFERENTIAL/PLATELET
Abs Immature Granulocytes: 0.03 10*3/uL (ref 0.00–0.07)
Basophils Absolute: 0.1 10*3/uL (ref 0.0–0.1)
Basophils Relative: 1 %
Eosinophils Absolute: 0.3 10*3/uL (ref 0.0–0.5)
Eosinophils Relative: 4 %
HCT: 33 % — ABNORMAL LOW (ref 36.0–46.0)
Hemoglobin: 9.9 g/dL — ABNORMAL LOW (ref 12.0–15.0)
Immature Granulocytes: 0 %
Lymphocytes Relative: 26 %
Lymphs Abs: 2.1 10*3/uL (ref 0.7–4.0)
MCH: 23 pg — ABNORMAL LOW (ref 26.0–34.0)
MCHC: 30 g/dL (ref 30.0–36.0)
MCV: 76.7 fL — ABNORMAL LOW (ref 80.0–100.0)
Monocytes Absolute: 0.7 10*3/uL (ref 0.1–1.0)
Monocytes Relative: 9 %
Neutro Abs: 4.7 10*3/uL (ref 1.7–7.7)
Neutrophils Relative %: 60 %
Platelets: 372 10*3/uL (ref 150–400)
RBC: 4.3 MIL/uL (ref 3.87–5.11)
RDW: 15.7 % — ABNORMAL HIGH (ref 11.5–15.5)
WBC: 7.8 10*3/uL (ref 4.0–10.5)
nRBC: 0 % (ref 0.0–0.2)

## 2020-12-01 LAB — BASIC METABOLIC PANEL
Anion gap: 8 (ref 5–15)
BUN: 12 mg/dL (ref 6–20)
CO2: 22 mmol/L (ref 22–32)
Calcium: 9 mg/dL (ref 8.9–10.3)
Chloride: 105 mmol/L (ref 98–111)
Creatinine, Ser: 0.56 mg/dL (ref 0.44–1.00)
GFR, Estimated: >60 mL/min (ref >60)
Glucose, Bld: 192 mg/dL — ABNORMAL HIGH (ref 70–99)
Potassium: 3.9 mmol/L (ref 3.5–5.1)
Sodium: 135 mmol/L (ref 135–145)

## 2020-12-01 LAB — TROPONIN I (HIGH SENSITIVITY)
Troponin I (High Sensitivity): <2 ng/L (ref <18)
Troponin I (High Sensitivity): <2 ng/L (ref <18)

## 2020-12-01 LAB — MAGNESIUM: Magnesium: 1.5 mg/dL — ABNORMAL LOW (ref 1.7–2.4)

## 2020-12-01 MED ORDER — MAGNESIUM SULFATE 2 GM/50ML IV SOLN
2.0000 g | Freq: Once | INTRAVENOUS | Status: AC
  Administered 2020-12-01: 2 g via INTRAVENOUS
  Filled 2020-12-01: qty 50

## 2020-12-01 NOTE — ED Provider Notes (Signed)
[facility] EMERGENCY DEPARTMENT Provider Note   CSN: [id number] Arrival date & time: 12/01/20  0216   History Chief Complaint  Patient presents with  . Dizziness    Stacey Richardson is a 55 y.o. female.  The history is provided by the patient.  Dizziness She has history of hypertension, diabetes, hyperlipidemia, stroke, heart failure with reduced ejection fraction and comes in because of feeling lightheaded.  She has been having episodes of lightheadedness since last October, getting worse over the last 68months.  She has a history of supraventricular tachycardia and sometimes has palpitations with episodes of dizziness, but most often palpitations or not present when she is lightheaded.  Episode today started when she was supine, seem to get better when she was standing.  There was no chest pain, heaviness, tightness, pressure.  There were no palpitations.  She denies dyspnea, nausea, diaphoresis.  She states that she is concerned that she had a heart attack.  Of note, she is scheduled for ablation to treat SVT as well as implantation of ICD on 3/29.  Also, she was started on spironolactone 12.5 mg a day about 2 weeks ago and wonders if that might be contributing to some of her symptoms.  Past Medical History:  Diagnosis Date  . CAD (coronary artery disease)   . CVA (cerebral vascular accident) ([place])   . Diabetes mellitus   . HFrEF (heart failure with reduced ejection fraction) ([place])   . HLD (hyperlipidemia)   . Hypertension     Patient Active Problem List   Diagnosis Date Noted  . Ischemic cardiomyopathy 10/11/2020  . Hyperlipidemia associated with type 2 diabetes mellitus ([place]) 10/11/2020  . CAD in native artery 08/02/2020  . Chronic HFrEF (heart failure with reduced ejection fraction) ([place]) 07/26/2020  . Lightheadedness 07/26/2020  . HFrEF (heart failure with reduced ejection fraction) ([place]) 06/20/2020  . Cardiomyopathy ([place]) 06/20/2020  . Hyperlipidemia LDL goal <70  06/20/2020  . Hyperglycemia   . Cryptogenic stroke ([place])   . TIA (transient ischemic attack) 06/12/2020  . Multifocal pneumonia 01/22/2019  . Type 2 diabetes mellitus without complication ([place])   . Essential hypertension     Past Surgical History:  Procedure Laterality Date  . CARDIAC CATHETERIZATION    . CLIPPING OF ATRIAL APPENDAGE N/A 08/06/2020   Procedure: CLIPPING OF ATRIAL APPENDAGE USING ATRICURE CLIP SIZE 40MM;  Surgeon: Wonda Olds, MD;  Location: [place];  Service: Open Heart Surgery;  Laterality: N/A;  . CORONARY ARTERY BYPASS GRAFT N/A 08/06/2020   Procedure: CORONARY ARTERY BYPASS GRAFTING (CABG) X2, USING BILATERAL INTERNAL MAMMARY ARTERIES;  Surgeon: Wonda Olds, MD;  Location: [place];  Service: Open Heart Surgery;  Laterality: N/A;  . KNEE SURGERY    . RIGHT/LEFT HEART CATH AND CORONARY ANGIOGRAPHY Bilateral 08/02/2020   Procedure: RIGHT/LEFT HEART CATH AND CORONARY ANGIOGRAPHY;  Surgeon: [name], MD;  Location: [facility];  Service: Cardiovascular;  Laterality: Bilateral;  . TEE WITHOUT CARDIOVERSION N/A 07/18/2020   Procedure: TRANSESOPHAGEAL ECHOCARDIOGRAM (TEE);  Surgeon: [name], MD;  Location: [facility];  Service: Cardiovascular;  Laterality: N/A;  . TEE WITHOUT CARDIOVERSION N/A 08/06/2020   Procedure: TRANSESOPHAGEAL ECHOCARDIOGRAM (TEE);  Surgeon: Wonda Olds, MD;  Location: [place];  Service: Open Heart Surgery;  Laterality: N/A;     OB History   No obstetric history on file.     Family History  Problem Relation Age of Onset  . Heart disease Mother  a. ?valve  . Heart failure Brother   . Hypertension Brother     Social History   Tobacco Use  . Smoking status: Former Research scientist (life sciences)  . Smokeless tobacco: Never Used  . Tobacco comment: Quit 30 years ago  Vaping Use  . Vaping Use: Never used  Substance Use Topics  . Alcohol use: Yes    Comment: social  . Drug use: No    Home Medications Prior to Admission  medications   Medication Sig Start Date End Date Taking? Authorizing Provider  acetaminophen (TYLENOL) 500 MG tablet Take 500 mg by mouth every 6 (six) hours as needed for moderate pain or headache.    [provider]  aspirin EC 81 MG tablet Take 81 mg by mouth daily.     [provider]  atorvastatin (LIPITOR) 80 MG tablet Take 80 mg by mouth at bedtime. 07/14/20   [provider]  carvedilol (COREG) 12.5 MG tablet Take 1 tablet (12.5 mg total) by mouth 2 (two) times daily. 10/11/20 01/09/21  End, Harrell Gave, MD  clopidogrel (PLAVIX) 75 MG tablet Take 1 tablet (75 mg total) by mouth daily. 06/14/20   Lorella Nimrod, MD  losartan (COZAAR) 25 MG tablet Take 1 tablet (25 mg total) by mouth daily. 10/11/20 01/09/21  End, Harrell Gave, MD  metFORMIN (GLUCOPHAGE-XR) 500 MG 24 hr tablet Take 500 mg by mouth daily with breakfast. 06/08/20   [provider]  spironolactone (ALDACTONE) 25 MG tablet Take 0.5 tablets (12.5 mg total) by mouth daily. 11/13/20 05/12/21  Loel Dubonnet, NP  tetrahydrozoline 0.05 % ophthalmic solution Place 1-2 drops into both eyes daily as needed (irritated eyes.). visine    [provider]    Allergies    Peanut butter flavor and Lisinopril  Review of Systems   Review of Systems  Neurological: Positive for dizziness.  All other systems reviewed and are negative.   Physical Exam Updated Vital Signs BP 139/79 (BP Location: Left Arm)   Pulse 82   Temp 98 F (36.7 C) (Oral)   Resp 18   Ht 5\' 6"  (1.676 m)   Wt 84.4 kg   LMP 08/22/2020 (Exact Date)   SpO2 100%   BMI 30.02 kg/m   Physical Exam Vitals and nursing note reviewed.   55 year old female, resting comfortably and in no acute distress. Vital signs are normal. Oxygen saturation is 100%, which is normal. Head is normocephalic and atraumatic. PERRLA, EOMI. Oropharynx is clear. Neck is nontender and supple without adenopathy or JVD. Back is nontender and there is no CVA  tenderness. Lungs are clear without rales, wheezes, or rhonchi. Chest is nontender. Heart has regular rate and rhythm without murmur. Abdomen is soft, flat, nontender without masses or hepatosplenomegaly and peristalsis is normoactive. Extremities have no cyanosis or edema, full range of motion is present. Skin is warm and dry without rash. Neurologic: Mental status is normal, cranial nerves are intact, there are no motor or sensory deficits.  There is no nystagmus.  Dizziness is not reproduced by passive head movement.  ED Results / Procedures / Treatments   Labs (all labs ordered are listed, but only abnormal results are displayed) Labs Reviewed  BASIC METABOLIC PANEL - Abnormal; Notable for the following components:      Result Value   Glucose, Bld 192 (*)    All other components within normal limits  CBC WITH DIFFERENTIAL/PLATELET - Abnormal; Notable for the following components:   Hemoglobin 9.9 (*)    HCT  33.0 (*)    MCV 76.7 (*)    MCH 23.0 (*)    RDW 15.7 (*)    All other components within normal limits  MAGNESIUM - Abnormal; Notable for the following components:   Magnesium 1.5 (*)    All other components within normal limits  TROPONIN I (HIGH SENSITIVITY)  TROPONIN I (HIGH SENSITIVITY)    EKG XYLIA, SCHERGER [id number] 01-Dec-2020 03:01:04 [facility]-N[facility] ROUTINE RECORD Sinus rhythm Prolonged PR interval Left atrial enlargement Low voltage, precordial leads RSR' in V1 or V2, probably normal variant Consider anterior infarct When compared with ECG of 10/15/2020, No significant change was found Confirmed by Delora Fuel ([id number]) on 12/01/2020 3:12:19 AM 2mm/s 72mm/mV 150Hz  9.0.4 CID: [id number] Confirmed By: Delora Fuel Vent. rate 88 BPM PR interval * ms QRS duration 99 ms QT/QTc 401/486 ms P-R-T axes 84 16 73 17-Mar-1966 (73 yr) Female Black Room:ER3 Loc:0 Technician: Test ind:  Procedures Procedures   Medications Ordered in ED Medications -  No data to display  ED Course  I have reviewed the triage vital signs and the nursing notes.  Pertinent labs & imaging results that were available during my care of the patient were reviewed by me and considered in my medical decision making (see chart for details).  MDM Rules/Calculators/A&P Episodic dizziness of uncertain cause.  This does not appear to be vertigo.  Symptoms being worse supine argue against orthostatic hypotension.  With known history of heart failure with reduced ejection fraction, question whether these are periods of decreased cardiac output.  Will check screening labs.  Old records are reviewed, and she has had several prior ED visits for dizziness.  ECG is unchanged from prior.  Labs show normal electrolytes and renal function.  Magnesium is slightly low at 1.5, and she is given a dose of IV magnesium.  Anemia is present with hemoglobin 9.9, which is unchanged from baseline.  Initial troponin is normal, awaiting repeat troponin.  Repeat troponin is normal.  Patient is discharged with instructions to follow-up with her cardiologist for scheduled ablation and ICD insertion.  Final Clinical Impression(s) / ED Diagnoses Final diagnoses:  Lightheadedness  Microcytic anemia  Heart failure with reduced ejection fraction [facility])    Rx / DC Orders ED Discharge Orders    None       Delora Fuel, MD 93/81/82 [id number]

## 2020-12-01 NOTE — ED Triage Notes (Signed)
Pt c/o feeling dizziness on and off since this morning. Pt states this has been happening more since she had some medication changes. Pt says she is scheduled for pacemaker placement on 12/05/20.

## 2020-12-01 NOTE — Telephone Encounter (Signed)
Per Dr. Elenor Quinones carvedilol 2 days prior to procedure.  Do NOT discontinue plavix.  Pt updated via MyChart.  Orders entered.  Work up complete.

## 2020-12-01 NOTE — Discharge Instructions (Signed)
Please keep your appointment for the ablation and pacemaker insertion.  Return if symptoms are worsening.

## 2020-12-04 ENCOUNTER — Other Ambulatory Visit
Admission: RE | Admit: 2020-12-04 | Discharge: 2020-12-04 | Disposition: A | Discharge status: Home / Self Care | Payer: 59 | Source: Home / Self Care | Attending: Cardiology | Admitting: Cardiology

## 2020-12-04 ENCOUNTER — Other Ambulatory Visit
Admission: RE | Admit: 2020-12-04 | Discharge: 2020-12-04 | Disposition: A | Discharge status: Home / Self Care | Payer: 59 | Source: Ambulatory Visit | Attending: Cardiology | Admitting: Cardiology

## 2020-12-04 ENCOUNTER — Other Ambulatory Visit: Payer: Self-pay

## 2020-12-04 DIAGNOSIS — I471 Supraventricular tachycardia: Secondary | ICD-10-CM

## 2020-12-04 DIAGNOSIS — Z01812 Encounter for preprocedural laboratory examination: Secondary | ICD-10-CM | POA: Insufficient documentation

## 2020-12-04 DIAGNOSIS — I255 Ischemic cardiomyopathy: Secondary | ICD-10-CM | POA: Insufficient documentation

## 2020-12-04 DIAGNOSIS — Z20822 Contact with and (suspected) exposure to covid-19: Secondary | ICD-10-CM | POA: Diagnosis not present

## 2020-12-04 LAB — CBC WITH DIFFERENTIAL/PLATELET
Abs Immature Granulocytes: 0.03 10*3/uL (ref 0.00–0.07)
Basophils Absolute: 0.1 10*3/uL (ref 0.0–0.1)
Basophils Relative: 1 %
Eosinophils Absolute: 0.3 10*3/uL (ref 0.0–0.5)
Eosinophils Relative: 4 %
HCT: 33.1 % — ABNORMAL LOW (ref 36.0–46.0)
Hemoglobin: 9.7 g/dL — ABNORMAL LOW (ref 12.0–15.0)
Immature Granulocytes: 1 %
Lymphocytes Relative: 20 %
Lymphs Abs: 1.3 10*3/uL (ref 0.7–4.0)
MCH: 21.9 pg — ABNORMAL LOW (ref 26.0–34.0)
MCHC: 29.3 g/dL — ABNORMAL LOW (ref 30.0–36.0)
MCV: 74.9 fL — ABNORMAL LOW (ref 80.0–100.0)
Monocytes Absolute: 0.5 10*3/uL (ref 0.1–1.0)
Monocytes Relative: 8 %
Neutro Abs: 4.4 10*3/uL (ref 1.7–7.7)
Neutrophils Relative %: 66 %
Platelets: 363 10*3/uL (ref 150–400)
RBC: 4.42 MIL/uL (ref 3.87–5.11)
RDW: 15.6 % — ABNORMAL HIGH (ref 11.5–15.5)
WBC: 6.5 10*3/uL (ref 4.0–10.5)
nRBC: 0 % (ref 0.0–0.2)

## 2020-12-04 NOTE — Pre-Procedure Instructions (Addendum)
Attempted to call patient regarding procedure instructions for tomorrow.  Left voice mail on the following items: Arrival time 0530 Nothing to eat or drink after midnight No meds AM of procedure Responsible person to drive you home and stay with you for 24 hrs Wash with special soap night before and morning of procedure If on anti-coagulant drug instructions continue Plavix, hold Coreg for 2 days.

## 2020-12-04 NOTE — Anesthesia Preprocedure Evaluation (Addendum)
Anesthesia Evaluation  Patient identified by MRN, date of birth, ID band Patient awake    Reviewed: Allergy & Precautions, NPO status , Patient's Chart, lab work & pertinent test results  History of Anesthesia Complications Negative for: history of anesthetic complications  Airway Mallampati: II  TM Distance: >3 FB Neck ROM: Full    Dental  (+) Dental Advisory Given, Teeth Intact   Pulmonary pneumonia, former smoker,  07/28/2020 SARS coronavirus NEG   Pulmonary exam normal breath sounds clear to auscultation       Cardiovascular hypertension, Pt. on medications and Pt. on home beta blockers (-) angina+ CAD (1. Severe ostial LMCA stenosis (~80%) with pressure dampening of 103F diagnostic catheter.), + CABG and +CHF  Normal cardiovascular exam Rhythm:Regular Rate:Normal  Echo 11/08/2020 1. Left ventricular ejection fraction, by estimation, is 30 to 35%. The left ventricle has moderate to severely decreased function. The average left ventricular global longitudinal strain is -12.1 %. The global longitudinal strain is abnormal.  2. The mitral valve is normal in structure. Mild mitral valve  regurgitation.  3. The aortic valve was not well visualized. Aortic valve regurgitation is not visualized.    07/18/2020 ECHO: EF 30-35%. The left ventricle has moderately decreased function. The left ventricular  internal cavity size was moderately dilated, mild-mod MR   Neuro/Psych TIACVA negative psych ROS   GI/Hepatic negative GI ROS, Neg liver ROS,   Endo/Other  diabetes, Oral Hypoglycemic Agentsobese  Renal/GU negative Renal ROS     Musculoskeletal   Abdominal (+) + obese,   Peds  Hematology  (+) Blood dyscrasia (Hb 10.4), anemia ,   Anesthesia Other Findings   Reproductive/Obstetrics                            Anesthesia Physical  Anesthesia Plan  ASA: IV  Anesthesia Plan: MAC   Post-op Pain  Management:    Induction: Intravenous  PONV Risk Score and Plan: 2 and Treatment may vary due to age or medical condition, Ondansetron, Dexamethasone, Midazolam, Propofol infusion and TIVA  Airway Management Planned:   Additional Equipment: None  Intra-op Plan:   Post-operative Plan:   Informed Consent: I have reviewed the patients History and Physical, chart, labs and discussed the procedure including the risks, benefits and alternatives for the proposed anesthesia with the patient or authorized representative who has indicated his/her understanding and acceptance.     Dental advisory given  Plan Discussed with: CRNA  Anesthesia Plan Comments:      Anesthesia Quick Evaluation

## 2020-12-05 ENCOUNTER — Ambulatory Visit (HOSPITAL_COMMUNITY): Payer: 59 | Admitting: Anesthesiology

## 2020-12-05 ENCOUNTER — Ambulatory Visit (HOSPITAL_COMMUNITY): Payer: 59

## 2020-12-05 ENCOUNTER — Ambulatory Visit (HOSPITAL_COMMUNITY)
Admission: RE | Admit: 2020-12-05 | Discharge: 2020-12-05 | Disposition: A | Discharge status: Home / Self Care | Payer: 59 | Attending: Cardiology | Admitting: Cardiology

## 2020-12-05 ENCOUNTER — Other Ambulatory Visit: Payer: Self-pay

## 2020-12-05 ENCOUNTER — Ambulatory Visit (HOSPITAL_COMMUNITY)
Admission: RE | Disposition: A | Discharge status: Home / Self Care | Payer: Medicaid Other | Source: Home / Self Care | Attending: Cardiology

## 2020-12-05 DIAGNOSIS — E785 Hyperlipidemia, unspecified: Secondary | ICD-10-CM | POA: Diagnosis not present

## 2020-12-05 DIAGNOSIS — Z951 Presence of aortocoronary bypass graft: Secondary | ICD-10-CM | POA: Insufficient documentation

## 2020-12-05 DIAGNOSIS — I471 Supraventricular tachycardia: Secondary | ICD-10-CM | POA: Diagnosis not present

## 2020-12-05 DIAGNOSIS — Z8673 Personal history of transient ischemic attack (TIA), and cerebral infarction without residual deficits: Secondary | ICD-10-CM | POA: Insufficient documentation

## 2020-12-05 DIAGNOSIS — Z888 Allergy status to other drugs, medicaments and biological substances status: Secondary | ICD-10-CM | POA: Diagnosis not present

## 2020-12-05 DIAGNOSIS — E119 Type 2 diabetes mellitus without complications: Secondary | ICD-10-CM | POA: Insufficient documentation

## 2020-12-05 DIAGNOSIS — I5022 Chronic systolic (congestive) heart failure: Secondary | ICD-10-CM | POA: Insufficient documentation

## 2020-12-05 DIAGNOSIS — I2510 Atherosclerotic heart disease of native coronary artery without angina pectoris: Secondary | ICD-10-CM | POA: Diagnosis not present

## 2020-12-05 DIAGNOSIS — Z8249 Family history of ischemic heart disease and other diseases of the circulatory system: Secondary | ICD-10-CM | POA: Insufficient documentation

## 2020-12-05 DIAGNOSIS — Z95810 Presence of automatic (implantable) cardiac defibrillator: Secondary | ICD-10-CM

## 2020-12-05 DIAGNOSIS — Z87891 Personal history of nicotine dependence: Secondary | ICD-10-CM | POA: Insufficient documentation

## 2020-12-05 DIAGNOSIS — I110 Hypertensive heart disease with heart failure: Secondary | ICD-10-CM | POA: Insufficient documentation

## 2020-12-05 DIAGNOSIS — I255 Ischemic cardiomyopathy: Secondary | ICD-10-CM | POA: Diagnosis not present

## 2020-12-05 HISTORY — PX: SVT ABLATION: EP1225

## 2020-12-05 HISTORY — PX: ICD IMPLANT: EP1208

## 2020-12-05 LAB — SARS CORONAVIRUS 2 (TAT 6-24 HRS): SARS Coronavirus 2: NEGATIVE

## 2020-12-05 LAB — PREGNANCY, URINE: Preg Test, Ur: NEGATIVE

## 2020-12-05 LAB — GLUCOSE, CAPILLARY
Glucose-Capillary: 153 mg/dL — ABNORMAL HIGH (ref 70–99)
Glucose-Capillary: 156 mg/dL — ABNORMAL HIGH (ref 70–99)

## 2020-12-05 SURGERY — SVT ABLATION
Anesthesia: General

## 2020-12-05 MED ORDER — LIDOCAINE HCL (PF) 1 % IJ SOLN
INTRAMUSCULAR | Status: DC | PRN
  Administered 2020-12-05: 20 mL
  Administered 2020-12-05: 60 mL

## 2020-12-05 MED ORDER — HEPARIN SODIUM (PORCINE) 1000 UNIT/ML IJ SOLN
INTRAMUSCULAR | Status: AC
  Filled 2020-12-05: qty 1

## 2020-12-05 MED ORDER — DROPERIDOL 2.5 MG/ML IJ SOLN: 0.6250 mg | Freq: Once | INTRAMUSCULAR | Status: DC | PRN

## 2020-12-05 MED ORDER — SODIUM CHLORIDE 0.9 % IV SOLN: INTRAVENOUS | Status: DC

## 2020-12-05 MED ORDER — CEFAZOLIN SODIUM-DEXTROSE 2-4 GM/100ML-% IV SOLN
INTRAVENOUS | Status: AC
  Filled 2020-12-05: qty 100

## 2020-12-05 MED ORDER — LIDOCAINE HCL 1 % IJ SOLN
INTRAMUSCULAR | Status: AC
  Filled 2020-12-05: qty 20

## 2020-12-05 MED ORDER — HEPARIN (PORCINE) IN NACL 1000-0.9 UT/500ML-% IV SOLN
INTRAVENOUS | Status: AC
  Filled 2020-12-05: qty 500

## 2020-12-05 MED ORDER — ONDANSETRON HCL 4 MG/2ML IJ SOLN: 4.0000 mg | Freq: Four times a day (QID) | INTRAMUSCULAR | Status: DC | PRN

## 2020-12-05 MED ORDER — ISOPROTERENOL HCL 0.2 MG/ML IJ SOLN
INTRAVENOUS | Status: DC | PRN
  Administered 2020-12-05: 2 ug/min via INTRAVENOUS

## 2020-12-05 MED ORDER — LIDOCAINE HCL 1 % IJ SOLN
INTRAMUSCULAR | Status: AC
  Filled 2020-12-05: qty 40

## 2020-12-05 MED ORDER — ISOPROTERENOL HCL 0.2 MG/ML IJ SOLN
INTRAMUSCULAR | Status: AC
  Filled 2020-12-05: qty 5

## 2020-12-05 MED ORDER — PROPOFOL 500 MG/50ML IV EMUL
INTRAVENOUS | Status: DC | PRN
  Administered 2020-12-05: 100 ug/kg/min via INTRAVENOUS

## 2020-12-05 MED ORDER — MIDAZOLAM HCL 5 MG/5ML IJ SOLN
INTRAMUSCULAR | Status: DC | PRN
  Administered 2020-12-05 (×2): 1 mg via INTRAVENOUS

## 2020-12-05 MED ORDER — DEXMEDETOMIDINE (PRECEDEX) IN NS 20 MCG/5ML (4 MCG/ML) IV SYRINGE
PREFILLED_SYRINGE | INTRAVENOUS | Status: DC | PRN
  Administered 2020-12-05: 4 ug via INTRAVENOUS
  Administered 2020-12-05: 8 ug via INTRAVENOUS
  Administered 2020-12-05 (×2): 4 ug via INTRAVENOUS

## 2020-12-05 MED ORDER — CEFAZOLIN SODIUM-DEXTROSE 2-4 GM/100ML-% IV SOLN
2.0000 g | INTRAVENOUS | Status: AC
  Administered 2020-12-05: 2 g via INTRAVENOUS
  Filled 2020-12-05: qty 100

## 2020-12-05 MED ORDER — PHENYLEPHRINE HCL-NACL 10-0.9 MG/250ML-% IV SOLN
INTRAVENOUS | Status: DC | PRN
  Administered 2020-12-05: 25 ug/min via INTRAVENOUS

## 2020-12-05 MED ORDER — POVIDONE-IODINE 10 % EX SWAB: 2.0000 "application " | Freq: Once | CUTANEOUS | Status: DC

## 2020-12-05 MED ORDER — ACETAMINOPHEN 325 MG PO TABS
325.0000 mg | ORAL_TABLET | ORAL | Status: DC | PRN
  Administered 2020-12-05: 650 mg via ORAL

## 2020-12-05 MED ORDER — FENTANYL CITRATE (PF) 100 MCG/2ML IJ SOLN
INTRAMUSCULAR | Status: DC | PRN
  Administered 2020-12-05 (×2): 25 ug via INTRAVENOUS
  Administered 2020-12-05: 50 ug via INTRAVENOUS

## 2020-12-05 MED ORDER — CHLORHEXIDINE GLUCONATE 4 % EX LIQD
4.0000 "application " | Freq: Once | CUTANEOUS | Status: DC
  Filled 2020-12-05: qty 60

## 2020-12-05 MED ORDER — FENTANYL CITRATE (PF) 100 MCG/2ML IJ SOLN: 25.0000 ug | INTRAMUSCULAR | Status: DC | PRN

## 2020-12-05 MED ORDER — ACETAMINOPHEN 325 MG PO TABS
ORAL_TABLET | ORAL | Status: AC
  Filled 2020-12-05: qty 2

## 2020-12-05 MED ORDER — HEPARIN SODIUM (PORCINE) 1000 UNIT/ML IJ SOLN
INTRAMUSCULAR | Status: DC | PRN
  Administered 2020-12-05: 1000 [IU] via INTRAVENOUS

## 2020-12-05 MED ORDER — SODIUM CHLORIDE 0.9 % IV SOLN
INTRAVENOUS | Status: AC
  Filled 2020-12-05: qty 2

## 2020-12-05 MED ORDER — SODIUM CHLORIDE 0.9 % IV SOLN
80.0000 mg | INTRAVENOUS | Status: AC
  Administered 2020-12-05: 80 mg
  Filled 2020-12-05: qty 2

## 2020-12-05 MED ORDER — HEPARIN (PORCINE) IN NACL 1000-0.9 UT/500ML-% IV SOLN
INTRAVENOUS | Status: DC | PRN
  Administered 2020-12-05 (×3): 500 mL

## 2020-12-05 MED ORDER — ONDANSETRON HCL 4 MG/2ML IJ SOLN
INTRAMUSCULAR | Status: DC | PRN
  Administered 2020-12-05: 4 mg via INTRAVENOUS

## 2020-12-05 MED ORDER — BUPIVACAINE HCL (PF) 0.25 % IJ SOLN
INTRAMUSCULAR | Status: DC | PRN
  Administered 2020-12-05: 60 mL

## 2020-12-05 SURGICAL SUPPLY — 22 items
BLANKET WARM UNDERBOD FULL ACC (MISCELLANEOUS) ×2 IMPLANT
CABLE SURGICAL S-101-97-12 (CABLE) ×2 IMPLANT
CATH CRD2 QUAD 6FR 5 (CATHETERS) ×2 IMPLANT
CATH JOSEPH QUAD ALLRED 6F REP (CATHETERS) ×2 IMPLANT
CATH SMTCH THERMOCOOL SF DF (CATHETERS) ×2 IMPLANT
CATH WEBSTER BI DIR CS D-F CRV (CATHETERS) ×2 IMPLANT
CLOSURE PERCLOSE PROSTYLE (VASCULAR PRODUCTS) ×4 IMPLANT
ICD ACTICOR DX (ICD Generator) ×2 IMPLANT
KIT MICROPUNCTURE NIT STIFF (SHEATH) ×2 IMPLANT
LEAD PLEXA 65/15 (Lead) ×2 IMPLANT
PACK EP LATEX FREE (CUSTOM PROCEDURE TRAY) ×2
PACK EP LF (CUSTOM PROCEDURE TRAY) ×1 IMPLANT
PAD PRO RADIOLUCENT 2001M-C (PAD) ×2 IMPLANT
PATCH CARTO3 (PAD) ×2 IMPLANT
POUCH AIGIS-R ANTIBACT ICD (Mesh General) ×2 IMPLANT
SHEATH 8FR PRELUDE SNAP 13 (SHEATH) ×2 IMPLANT
SHEATH PINNACLE 6F 10CM (SHEATH) ×2 IMPLANT
SHEATH PINNACLE 7F 10CM (SHEATH) ×2 IMPLANT
SHEATH PINNACLE 8F 10CM (SHEATH) ×2 IMPLANT
SHEATH PROBE COVER 6X72 (BAG) ×2 IMPLANT
TRAY PACEMAKER INSERTION (PACKS) ×2 IMPLANT
TUBING SMART ABLATE COOLFLOW (TUBING) ×2 IMPLANT

## 2020-12-05 NOTE — Discharge Instructions (Signed)
R groin site care No lifting over 5 lbs for 1 week. No vigorous or sexual activity for 1 week.  Keep procedure site clean & dry. If you notice increased pain, swelling, bleeding or pus, call/return!  You may shower after 24 hours, but no soaking in baths/hot tubs/pools for 1 week.          Supplemental Discharge Instructions for  Pacemaker/Defibrillator Patients  Tomorrow, 12/06/20, send in a device transmission  Activity No heavy lifting or vigorous activity with your left/right arm for 6 to 8 weeks.  Do not raise your left/right arm above your head for one week.  Gradually raise your affected arm as drawn below.             12/10/20                       12/11/20                      12/12/20                    12/13/20 __  NO DRIVING until cleared to at your wound check visit  WOUND CARE - Keep the wound area clean and dry.  Do not get this area wet , no showers until cleared to at your wound check visit - Tomorrow, 12/06/20, remove the arm sling - Tomorrow, 12/06/20  remove the outer plastic bandages from both sites.  Underneath the plastic bandages there are steri strips (paper tapes), DO NOT remove these. - The tape/steri-strips on your wound will fall off; do not pull them off.  No bandage is needed on the site.  DO  NOT apply any creams, oils, or ointments to the wound area. - If you notice any drainage or discharge from the wound, any swelling or bruising at the site, or you develop a fever > 101? F after you are discharged home, call the office at once.  Special Instructions - You are still able to use cellular telephones; use the ear opposite the side where you have your pacemaker/defibrillator.  Avoid carrying your cellular phone near your device. - When traveling through airports, show security personnel your identification card to avoid being screened in the metal detectors.  Ask the security personnel to use the hand wand. - Avoid arc welding equipment, MRI testing (magnetic  resonance imaging), TENS units (transcutaneous nerve stimulators).  Call the office for questions about other devices. - Avoid electrical appliances that are in poor condition or are not properly grounded. - Microwave ovens are safe to be near or to operate.  Additional information for defibrillator patients should your device go off: - If your device goes off ONCE and you feel fine afterward, notify the device clinic nurses. - If your device goes off ONCE and you do not feel well afterward, call 911. - If your device goes off TWICE, call 911. - If your device goes off THREE times in one day, call 911.  DO NOT DRIVE YOURSELF OR A FAMILY MEMBER WITH A DEFIBRILLATOR TO THE HOSPITAL--CALL 911.

## 2020-12-05 NOTE — Progress Notes (Addendum)
Bio tonic in to see pt  Discharge instructions with pt and her brother (via telephone) both voice understanding.

## 2020-12-05 NOTE — Transfer of Care (Signed)
Immediate Anesthesia Transfer of Care Note  Patient: Stacey Richardson  Procedure(s) Performed: SVT ABLATION (N/A ) ICD IMPLANT (N/A )  Patient Location: Cath Lab  Anesthesia Type:MAC  Level of Consciousness: awake and alert   Airway & Oxygen Therapy: Patient Spontanous Breathing and Patient connected to nasal cannula oxygen  Post-op Assessment: Report given to RN and Post -op Vital signs reviewed and stable  Post vital signs: Reviewed and stable  Last Vitals:  Vitals Value Taken Time  BP 132/58 12/05/20 1116  Temp 36.2 C 12/05/20 1058  Pulse 87 12/05/20 1116  Resp 21 12/05/20 1116  SpO2 100 % 12/05/20 1116  Vitals shown include unvalidated device data.  Last Pain:  Vitals:   12/05/20 1058  TempSrc:   PainSc: Asleep         Complications: No complications documented.

## 2020-12-05 NOTE — Progress Notes (Signed)
Dr Quentin Ore in to see pt.

## 2020-12-05 NOTE — Anesthesia Postprocedure Evaluation (Signed)
Anesthesia Post Note  Patient: Stacey Richardson  Procedure(s) Performed: SVT ABLATION (N/A ) ICD IMPLANT (N/A )     Patient location during evaluation: PACU Anesthesia Type: MAC Level of consciousness: awake and alert Pain management: pain level controlled Vital Signs Assessment: post-procedure vital signs reviewed and stable Respiratory status: spontaneous breathing Cardiovascular status: stable Anesthetic complications: no   No complications documented.  Last Vitals:  Vitals:   12/05/20 1345 12/05/20 1400  BP:  (!) 146/63  Pulse: (!) 52 92  Resp: 20 18  Temp:    SpO2: 98% 100%    Last Pain:  Vitals:   12/05/20 1315  TempSrc:   PainSc: [place]

## 2020-12-05 NOTE — Interval H&P Note (Signed)
History and Physical Interval Note:  12/05/2020 7:09 AM  Stacey Richardson  has presented today for surgery, with the diagnosis of svt  - cardiomyopathy.  The various methods of treatment have been discussed with the patient and family. After consideration of risks, benefits and other options for treatment, the patient has consented to  Procedure(s): SVT ABLATION (N/A) ICD IMPLANT (N/A) as a surgical intervention.  The patient's history has been reviewed, patient examined, no change in status, stable for surgery.  I have reviewed the patient's chart and labs.  Questions were answered to the patient's satisfaction.     Ichiro Chesnut T Jodeci Rini

## 2020-12-05 NOTE — Anesthesia Procedure Notes (Signed)
Procedure Name: MAC Date/Time: 12/05/2020 7:45 AM Performed by: Lieutenant Diego, CRNA Pre-anesthesia Checklist: Patient identified, Emergency Drugs available, Suction available, Patient being monitored and Timeout performed Patient Re-evaluated:Patient Re-evaluated prior to induction Oxygen Delivery Method: Simple face mask Preoxygenation: Pre-oxygenation with 100% oxygen Induction Type: IV induction

## 2020-12-06 ENCOUNTER — Telehealth: Payer: Self-pay

## 2020-12-06 ENCOUNTER — Encounter (HOSPITAL_COMMUNITY): Payer: Self-pay | Admitting: Cardiology

## 2020-12-06 MED FILL — Lidocaine HCl Local Inj 1%: INTRAMUSCULAR | Qty: 20 | Status: AC

## 2020-12-06 MED FILL — Lidocaine HCl Local Inj 1%: INTRAMUSCULAR | Qty: 80 | Status: AC

## 2020-12-06 NOTE — Telephone Encounter (Signed)
-----   Message from [place], [place] sent at 12/05/2020  7:07 PM EDT ----- Same day d/c  Biot ICD  CL

## 2020-12-06 NOTE — Telephone Encounter (Signed)
Follow-up after same day discharge Implant date: 12/05/20 MD: Dr. Lars Mage Device: Biotronik Acticor Location: Left chest   Wound check visit: 12/19/20 @ 3:20 90 day MD follow-up: 03/28/21 @ 1:20  Remote Transmission received:Yes  Dressing removed: Will remove later (Not 24 post-op) Patient aware to call if she has redness, swelling, bleeding, drainage, fever or chills.  Sling: Will remove later. (Not 24 post-op)

## 2020-12-09 ENCOUNTER — Other Ambulatory Visit: Payer: Self-pay | Admitting: Internal Medicine

## 2020-12-09 ENCOUNTER — Other Ambulatory Visit: Payer: Self-pay

## 2020-12-09 ENCOUNTER — Emergency Department (HOSPITAL_COMMUNITY)
Admission: EM | Admit: 2020-12-09 | Discharge: 2020-12-09 | Disposition: A | Discharge status: Home / Self Care | Payer: Medicaid Other | Attending: Emergency Medicine | Admitting: Emergency Medicine

## 2020-12-09 ENCOUNTER — Encounter (HOSPITAL_COMMUNITY): Payer: Self-pay

## 2020-12-09 DIAGNOSIS — E119 Type 2 diabetes mellitus without complications: Secondary | ICD-10-CM | POA: Insufficient documentation

## 2020-12-09 DIAGNOSIS — Z7984 Long term (current) use of oral hypoglycemic drugs: Secondary | ICD-10-CM | POA: Insufficient documentation

## 2020-12-09 DIAGNOSIS — R1084 Generalized abdominal pain: Secondary | ICD-10-CM | POA: Insufficient documentation

## 2020-12-09 DIAGNOSIS — Z7982 Long term (current) use of aspirin: Secondary | ICD-10-CM | POA: Diagnosis not present

## 2020-12-09 DIAGNOSIS — Z79899 Other long term (current) drug therapy: Secondary | ICD-10-CM | POA: Diagnosis not present

## 2020-12-09 DIAGNOSIS — Z955 Presence of coronary angioplasty implant and graft: Secondary | ICD-10-CM | POA: Insufficient documentation

## 2020-12-09 DIAGNOSIS — I509 Heart failure, unspecified: Secondary | ICD-10-CM | POA: Diagnosis not present

## 2020-12-09 DIAGNOSIS — Z87891 Personal history of nicotine dependence: Secondary | ICD-10-CM | POA: Insufficient documentation

## 2020-12-09 DIAGNOSIS — K5900 Constipation, unspecified: Secondary | ICD-10-CM | POA: Insufficient documentation

## 2020-12-09 DIAGNOSIS — I110 Hypertensive heart disease with heart failure: Secondary | ICD-10-CM | POA: Insufficient documentation

## 2020-12-09 DIAGNOSIS — Z91010 Allergy to peanuts: Secondary | ICD-10-CM | POA: Insufficient documentation

## 2020-12-09 DIAGNOSIS — Z7902 Long term (current) use of antithrombotics/antiplatelets: Secondary | ICD-10-CM | POA: Insufficient documentation

## 2020-12-09 DIAGNOSIS — R1013 Epigastric pain: Secondary | ICD-10-CM | POA: Diagnosis not present

## 2020-12-09 DIAGNOSIS — Z8673 Personal history of transient ischemic attack (TIA), and cerebral infarction without residual deficits: Secondary | ICD-10-CM | POA: Insufficient documentation

## 2020-12-09 DIAGNOSIS — I2510 Atherosclerotic heart disease of native coronary artery without angina pectoris: Secondary | ICD-10-CM | POA: Insufficient documentation

## 2020-12-09 MED ORDER — MAGNESIUM CITRATE PO SOLN
1.0000 | Freq: Once | ORAL | Status: AC
  Administered 2020-12-09: 1 via ORAL
  Filled 2020-12-09: qty 296

## 2020-12-09 MED ORDER — ALUM & MAG HYDROXIDE-SIMETH 200-200-20 MG/5ML PO SUSP
30.0000 mL | Freq: Once | ORAL | Status: AC
  Administered 2020-12-09: 30 mL via ORAL
  Filled 2020-12-09: qty 30

## 2020-12-09 MED ORDER — ALUM & MAG HYDROXIDE-SIMETH 400-400-40 MG/5ML PO SUSP: 15.0000 mL | Freq: Four times a day (QID) | ORAL | 0 refills | Status: AC | PRN

## 2020-12-09 NOTE — ED Provider Notes (Signed)
[facility] EMERGENCY DEPARTMENT Provider Note   CSN: [id number] Arrival date & time: 12/09/20  0330     History Chief Complaint  Patient presents with  . Pain    "gas pain"    Stacey Richardson is a 55 y.o. female.  55 year old female who presents the emerge from today for abdominal pain.  Patient is over the last couple days she had increased flatulence.  She is not sure why.  She states that tonight she had an episode that started at her rectum and was severe and then traveled up towards her epigastric area.  She states that it was relieved when she passed gas.  She states that she is asymptomatic at this time.  No nausea or vomiting.  She does have some decreased bowel movements and she describes her bowel movements as constipated.  No recent diarrhea.  No fevers.  No other associated symptoms.  The history is provided by the patient.  Abdominal Pain Pain location:  Epigastric and suprapubic Pain quality: gnawing, sharp, shooting, stabbing and throbbing   Pain radiates to:  Does not radiate Pain severity:  Moderate Timing:  Constant Progression:  Resolved Chronicity:  New Relieved by:  None tried Worsened by:  Nothing Ineffective treatments:  None tried      Past Medical History:  Diagnosis Date  . CAD (coronary artery disease)   . CVA (cerebral vascular accident) ([place])   . Diabetes mellitus   . HFrEF (heart failure with reduced ejection fraction) ([place])   . HLD (hyperlipidemia)   . Hypertension     Patient Active Problem List   Diagnosis Date Noted  . Ischemic cardiomyopathy 10/11/2020  . Hyperlipidemia associated with type 2 diabetes mellitus ([place]) 10/11/2020  . CAD in native artery 08/02/2020  . Chronic HFrEF (heart failure with reduced ejection fraction) ([place]) 07/26/2020  . Lightheadedness 07/26/2020  . HFrEF (heart failure with reduced ejection fraction) ([place]) 06/20/2020  . Cardiomyopathy ([place]) 06/20/2020  . Hyperlipidemia LDL goal <70 06/20/2020  .  Hyperglycemia   . Cryptogenic stroke ([place])   . TIA (transient ischemic attack) 06/12/2020  . Multifocal pneumonia 01/22/2019  . Type 2 diabetes mellitus without complication ([place])   . Essential hypertension     Past Surgical History:  Procedure Laterality Date  . CARDIAC CATHETERIZATION    . CLIPPING OF ATRIAL APPENDAGE N/A 08/06/2020   Procedure: CLIPPING OF ATRIAL APPENDAGE USING ATRICURE CLIP SIZE 40MM;  Surgeon: Wonda Olds, MD;  Location: [place];  Service: Open Heart Surgery;  Laterality: N/A;  . CORONARY ARTERY BYPASS GRAFT N/A 08/06/2020   Procedure: CORONARY ARTERY BYPASS GRAFTING (CABG) X2, USING BILATERAL INTERNAL MAMMARY ARTERIES;  Surgeon: Wonda Olds, MD;  Location: [place];  Service: Open Heart Surgery;  Laterality: N/A;  . ICD IMPLANT N/A 12/05/2020   Procedure: ICD IMPLANT;  Surgeon: Vickie Epley, MD;  Location: [place] [facility];  Service: Cardiovascular;  Laterality: N/A;  . KNEE SURGERY    . RIGHT/LEFT HEART CATH AND CORONARY ANGIOGRAPHY Bilateral 08/02/2020   Procedure: RIGHT/LEFT HEART CATH AND CORONARY ANGIOGRAPHY;  Surgeon: [name], MD;  Location: [place] [facility];  Service: Cardiovascular;  Laterality: Bilateral;  . SVT ABLATION N/A 12/05/2020   Procedure: SVT ABLATION;  Surgeon: Vickie Epley, MD;  Location: [place] [facility];  Service: Cardiovascular;  Laterality: N/A;  . TEE WITHOUT CARDIOVERSION N/A 07/18/2020   Procedure: TRANSESOPHAGEAL ECHOCARDIOGRAM (TEE);  Surgeon: [name], MD;  Location: [facility];  Service: Cardiovascular;  Laterality: N/A;  . TEE WITHOUT CARDIOVERSION N/A 08/06/2020   Procedure: TRANSESOPHAGEAL ECHOCARDIOGRAM (TEE);  Surgeon: Wonda Olds, MD;  Location: [place];  Service: Open Heart Surgery;  Laterality: N/A;     OB History   No obstetric history on file.     Family History  Problem Relation Age of Onset  . Heart disease Mother        a. ?valve  . Heart failure Brother   .  Hypertension Brother     Social History   Tobacco Use  . Smoking status: Former Research scientist (life sciences)  . Smokeless tobacco: Never Used  . Tobacco comment: Quit 30 years ago  Vaping Use  . Vaping Use: Never used  Substance Use Topics  . Alcohol use: Yes    Comment: social  . Drug use: No    Home Medications Prior to Admission medications   Medication Sig Start Date End Date Taking? Authorizing Provider  alum & mag hydroxide-simeth (MAALOX ADVANCED MAX ST) 400-400-40 MG/5ML suspension Take 15 mLs by mouth every 6 (six) hours as needed for indigestion. 12/09/20  Yes Hamdi Vari, Corene Cornea, MD  acetaminophen (TYLENOL) 500 MG tablet Take 500 mg by mouth every 6 (six) hours as needed for moderate pain or headache.    [provider]  aspirin EC 81 MG tablet Take 81 mg by mouth daily.     [provider]  atorvastatin (LIPITOR) 80 MG tablet Take 80 mg by mouth at bedtime. 07/14/20   [provider]  carvedilol (COREG) 12.5 MG tablet Take 1 tablet (12.5 mg total) by mouth 2 (two) times daily. 10/11/20 01/09/21  End, Harrell Gave, MD  clopidogrel (PLAVIX) 75 MG tablet Take 1 tablet (75 mg total) by mouth daily. 06/14/20   Lorella Nimrod, MD  losartan (COZAAR) 25 MG tablet Take 1 tablet (25 mg total) by mouth daily. 10/11/20 01/09/21  End, Harrell Gave, MD  metFORMIN (GLUCOPHAGE-XR) 500 MG 24 hr tablet Take 500 mg by mouth daily with breakfast. 06/08/20   [provider]  spironolactone (ALDACTONE) 25 MG tablet Take 0.5 tablets (12.5 mg total) by mouth daily. 11/13/20 05/12/21  Loel Dubonnet, NP  tetrahydrozoline 0.05 % ophthalmic solution Place 1-2 drops into both eyes daily as needed (irritated eyes.). visine    [provider]    Allergies    Peanut butter flavor and Lisinopril  Review of Systems   Review of Systems  Gastrointestinal: Positive for abdominal pain.  All other systems reviewed and are negative.   Physical Exam Updated Vital Signs BP (!) 147/83   Pulse 91    Temp 98.6 F (37 C) (Oral)   Resp 17   Ht 5\' 6"  (1.676 m)   Wt 84.8 kg   LMP 11/21/2020   SpO2 100%   BMI 30.18 kg/m   Physical Exam Vitals and nursing note reviewed.  Constitutional:      Appearance: She is well-developed.  HENT:     Head: Normocephalic and atraumatic.     Mouth/Throat:     Mouth: Mucous membranes are moist.     Pharynx: Oropharynx is clear.  Cardiovascular:     Rate and Rhythm: Normal rate and regular rhythm.  Pulmonary:     Effort: No respiratory distress.     Breath sounds: No stridor.  Abdominal:     General: Abdomen is flat. There is no distension.     Comments: No tenderness at all, hyperactive bowel sounds, no pulsatile masses  Musculoskeletal:  General: No swelling or tenderness. Normal range of motion.     Cervical back: Normal range of motion.  Skin:    General: Skin is warm and dry.  Neurological:     General: No focal deficit present.     Mental Status: She is alert.     ED Results / Procedures / Treatments   Labs (all labs ordered are listed, but only abnormal results are displayed) Labs Reviewed - No data to display  EKG None  Radiology No results found.  Procedures Procedures   Medications Ordered in ED Medications  magnesium citrate solution 1 Bottle (1 Bottle Oral Given 12/09/20 0427)  alum & mag hydroxide-simeth (MAALOX/MYLANTA) 200-200-20 MG/5ML suspension 30 mL (30 mLs Oral Given 12/09/20 0427)    ED Course  I have reviewed the triage vital signs and the nursing notes.  Pertinent labs & imaging results that were available during my care of the patient were reviewed by me and considered in my medical decision making (see chart for details).    MDM Rules/Calculators/A&P                          Suspect patient has constipation and increased flatulence because of that.  Will suggest magnesiums citrate followed by stool softeners and increased water intake with probiotics and increase fiber.  Low suspicion for  aortic dissection, abdominal aneurysm, bowel obstruction or any other emergent causes requiring imaging at this time.  She is asymptomatic at time of discharge. Final Clinical Impression(s) / ED Diagnoses Final diagnoses:  Generalized abdominal pain    Rx / DC Orders ED Discharge Orders         Ordered    alum & mag hydroxide-simeth (MAALOX ADVANCED MAX ST) 657-846-96 MG/5ML suspension  Every 6 hours PRN        12/09/20 0422           Lidia Clavijo, Corene Cornea, MD 12/09/20 [id number]

## 2020-12-09 NOTE — ED Triage Notes (Signed)
Complaints of gas pain for 5 days. Tonight same worsened and moved from rectum to chest and concerned pt.

## 2020-12-11 ENCOUNTER — Other Ambulatory Visit: Payer: Self-pay

## 2020-12-11 ENCOUNTER — Telehealth: Payer: Self-pay | Admitting: *Deleted

## 2020-12-11 ENCOUNTER — Ambulatory Visit: Payer: 59 | Admitting: Neurology

## 2020-12-11 ENCOUNTER — Encounter: Payer: Self-pay | Admitting: *Deleted

## 2020-12-11 ENCOUNTER — Encounter: Payer: Self-pay | Admitting: Neurology

## 2020-12-11 ENCOUNTER — Telehealth: Payer: Self-pay | Admitting: Internal Medicine

## 2020-12-11 VITALS — BP 128/72 | HR 90 | Ht 66.0 in | Wt 190.0 lb

## 2020-12-11 DIAGNOSIS — R4701 Aphasia: Secondary | ICD-10-CM

## 2020-12-11 DIAGNOSIS — I639 Cerebral infarction, unspecified: Secondary | ICD-10-CM

## 2020-12-11 DIAGNOSIS — Z951 Presence of aortocoronary bypass graft: Secondary | ICD-10-CM

## 2020-12-11 NOTE — Telephone Encounter (Signed)
Called to check on patient.  She had gotten new job. She did decided to get her ICD.  She missed time for training and lost her new job. She would like to return to rehab.  She plans to return on Wednesday 12/13/20.

## 2020-12-11 NOTE — Patient Instructions (Signed)
I had a long d/w patient about her  recent cryptogenic stroke, risk for recurrent stroke/TIAs, personally independently reviewed imaging studies and stroke evaluation results and answered questions.Continue aspirin 81 mg daily  for secondary stroke prevention and discussed with cardiology whether she needs Plavix in addition as from stroke standpoint aspirin alone will be enough and maintain strict control of hypertension with blood pressure goal below 130/90, diabetes with hemoglobin A1c goal below 6.5% and lipids with LDL cholesterol goal below 70 mg/dL. I also advised the patient to eat a healthy diet with plenty of whole grains, cereals, fruits and vegetables, exercise regularly and maintain ideal body weight.  Check lipid profile and hemoglobin A1c today.  Followup in the future with my nurse practitioner Janett Billow in 3 months or call earlier if necessary.  Stroke Prevention Some medical conditions and behaviors are associated with a higher chance of having a stroke. You can help prevent a stroke by making nutrition, lifestyle, and other changes, including managing any medical conditions you may have. What nutrition changes can be made?  Eat healthy foods. You can do this by: ? Choosing foods high in fiber, such as fresh fruits and vegetables and whole grains. ? Eating at least 5 or more servings of fruits and vegetables a day. Try to fill half of your plate at each meal with fruits and vegetables. ? Choosing lean protein foods, such as lean cuts of meat, poultry without skin, fish, tofu, beans, and nuts. ? Eating low-fat dairy products. ? Avoiding foods that are high in salt (sodium). This can help lower blood pressure. ? Avoiding foods that have saturated fat, trans fat, and cholesterol. This can help prevent high cholesterol. ? Avoiding processed and premade foods.  Follow your health care provider's specific guidelines for losing weight, controlling high blood pressure (hypertension), lowering  high cholesterol, and managing diabetes. These may include: ? Reducing your daily calorie intake. ? Limiting your daily sodium intake to 1,500 milligrams (mg). ? Using only healthy fats for cooking, such as olive oil, canola oil, or sunflower oil. ? Counting your daily carbohydrate intake.   What lifestyle changes can be made?  Maintain a healthy weight. Talk to your health care provider about your ideal weight.  Get at least 30 minutes of moderate physical activity at least 5 days a week. Moderate activity includes brisk walking, biking, and swimming.  Do not use any products that contain nicotine or tobacco, such as cigarettes and e-cigarettes. If you need help quitting, ask your health care provider. It may also be helpful to avoid exposure to secondhand smoke.  Limit alcohol intake to no more than 1 drink a day for nonpregnant women and 2 drinks a day for men. One drink equals 12 oz of beer, 5 oz of wine, or 1 oz of hard liquor.  Stop any illegal drug use.  Avoid taking birth control pills. Talk to your health care provider about the risks of taking birth control pills if: ? You are over 27 years old. ? You smoke. ? You get migraines. ? You have ever had a blood clot. What other changes can be made?  Manage your cholesterol levels. ? Eating a healthy diet is important for preventing high cholesterol. If cholesterol cannot be managed through diet alone, you may also need to take medicines. ? Take any prescribed medicines to control your cholesterol as told by your health care provider.  Manage your diabetes. ? Eating a healthy diet and exercising regularly are important parts of  managing your blood sugar. If your blood sugar cannot be managed through diet and exercise, you may need to take medicines. ? Take any prescribed medicines to control your diabetes as told by your health care provider.  Control your hypertension. ? To reduce your risk of stroke, try to keep your blood  pressure below 130/80. ? Eating a healthy diet and exercising regularly are an important part of controlling your blood pressure. If your blood pressure cannot be managed through diet and exercise, you may need to take medicines. ? Take any prescribed medicines to control hypertension as told by your health care provider. ? Ask your health care provider if you should monitor your blood pressure at home. ? Have your blood pressure checked every year, even if your blood pressure is normal. Blood pressure increases with age and some medical conditions.  Get evaluated for sleep disorders (sleep apnea). Talk to your health care provider about getting a sleep evaluation if you snore a lot or have excessive sleepiness.  Take over-the-counter and prescription medicines only as told by your health care provider. Aspirin or blood thinners (antiplatelets or anticoagulants) may be recommended to reduce your risk of forming blood clots that can lead to stroke.  Make sure that any other medical conditions you have, such as atrial fibrillation or atherosclerosis, are managed. What are the warning signs of a stroke? The warning signs of a stroke can be easily remembered as BEFAST.  B is for balance. Signs include: ? Dizziness. ? Loss of balance or coordination. ? Sudden trouble walking.  E is for eyes. Signs include: ? A sudden change in vision. ? Trouble seeing.  F is for face. Signs include: ? Sudden weakness or numbness of the face. ? The face or eyelid drooping to one side.  A is for arms. Signs include: ? Sudden weakness or numbness of the arm, usually on one side of the body.  S is for speech. Signs include: ? Trouble speaking (aphasia). ? Trouble understanding.  T is for time. ? These symptoms may represent a serious problem that is an emergency. Do not wait to see if the symptoms will go away. Get medical help right away. Call your local emergency services (911 in the [place] Do not drive  yourself to the hospital.  Other signs of stroke may include: ? A sudden, severe headache with no known cause. ? Nausea or vomiting. ? Seizure. Where to find more information For more information, visit:  American Stroke Association: www.strokeassociation.org  National Stroke Association: www.stroke.org Summary  You can prevent a stroke by eating healthy, exercising, not smoking, limiting alcohol intake, and managing any medical conditions you may have.  Do not use any products that contain nicotine or tobacco, such as cigarettes and e-cigarettes. If you need help quitting, ask your health care provider. It may also be helpful to avoid exposure to secondhand smoke.  Remember BEFAST for warning signs of stroke. Get help right away if you or a loved one has any of these signs. This information is not intended to replace advice given to you by your health care provider. Make sure you discuss any questions you have with your health care provider. Document Revised: 08/08/2017 Document Reviewed: 10/01/2016 Elsevier Patient Education  2021 [employer].

## 2020-12-11 NOTE — Progress Notes (Signed)
[facility] [address] [place]. [place] [address] [phone]       OFFICE CONSULT NOTE  Stacey Richardson Date of Birth:  [birth year] Medical Record Number:  [id number]   Referring MD: Lars Mage  Reason for Referral: Stroke  HPI: Stacey Richardson is a 55 year old African-American lady seen today for initial office consultation visit for stroke.  History is obtained from the patient and review of electronic medical records and I personally reviewed imaging films in PACS.  She has past medical history of diabetes hypertension mild obesity.  She presented on 06/12/2020 with sudden onset of dizziness and dropping food plate from the right hand.  She felt faint hearted and she had just received some bad news about her boyfriend being involved in a car accident.  When she came to the ED she had trouble speaking to the triage providers and was not able to get her words out but was able to text and explained that she was having trouble speaking.  Blood pressure slightly elevated.  CT scan of the head was unremarkable.  MRI scan showed a tiny punctate left frontal insular infarct.  MR angiogram of the brain and neck were both unremarkable.  She subsequently had outpatient transcranial Doppler bubble study done on 08/25/2020 which was done which showed medium size right-to-left shunt however a TEE on 07/18/2020 was negative for PFO or cardiac source of embolism.  An echocardiogram on 11/28/2020 which showed diminished ejection fraction of 30 to 35%.  She had a ICD implantation done on 12/05/2020 by Dr. Quentin Ore with removal of implantable loop recorder..  Patient states she has had no further recurrent stroke or TIA symptoms.  Blood pressures well controlled today it is 128/72.  She remains on aspirin and Plavix which is tolerating well without bruising or bleeding.  Blood sugars have ranged in the 130s fasting usually.  She cannot tell me what her last A1c or lipid profile was.  She has no new  complaints today.  ROS:   14 system review of systems is positive for speech difficulties only all other systems negative  PMH:  Past Medical History:  Diagnosis Date  . CAD (coronary artery disease)   . CVA (cerebral vascular accident) ([place])   . Diabetes mellitus   . HFrEF (heart failure with reduced ejection fraction) ([place])   . HLD (hyperlipidemia)   . Hypertension     Social History:  Social History   Socioeconomic History  . Marital status: Divorced    Spouse name: Not on file  . Number of children: Not on file  . Years of education: Not on file  . Highest education level: Not on file  Occupational History  . Occupation: on short term disability  Tobacco Use  . Smoking status: Former Research scientist (life sciences)  . Smokeless tobacco: Never Used  . Tobacco comment: Quit 30 years ago  Vaping Use  . Vaping Use: Never used  Substance and Sexual Activity  . Alcohol use: Yes    Comment: social  . Drug use: No  . Sexual activity: Not on file  Other Topics Concern  . Not on file  Social History Narrative   Lives with boyfriend   Right Handed   Drinks no caffeine daily   Social Determinants of Health   Financial Resource Strain: Not on file  Food Insecurity: Not on file  Transportation Needs: Not on file  Physical Activity: Not on file  Stress: Not on file  Social Connections: Not on file  Intimate Partner Violence: Not on file    Medications:   Current Outpatient Medications on File Prior to Visit  Medication Sig Dispense Refill  . acetaminophen (TYLENOL) 500 MG tablet Take 500 mg by mouth every 6 (six) hours as needed for moderate pain or headache.    Marland Kitchen alum & mag hydroxide-simeth (MAALOX ADVANCED MAX ST) 400-400-40 MG/5ML suspension Take 15 mLs by mouth every 6 (six) hours as needed for indigestion. 355 mL 0  . aspirin EC 81 MG tablet Take 81 mg by mouth daily.     Marland Kitchen atorvastatin (LIPITOR) 80 MG tablet Take 80 mg by mouth at bedtime.    . carvedilol (COREG) 12.5 MG tablet Take  1 tablet (12.5 mg total) by mouth 2 (two) times daily. 60 tablet 5  . clopidogrel (PLAVIX) 75 MG tablet Take 1 tablet (75 mg total) by mouth daily. 90 tablet 0  . losartan (COZAAR) 25 MG tablet TAKE 1 TABLET (25 MG TOTAL) BY MOUTH DAILY. 30 tablet 2  . metFORMIN (GLUCOPHAGE-XR) 500 MG 24 hr tablet Take 500 mg by mouth daily with breakfast.    . spironolactone (ALDACTONE) 25 MG tablet Take 0.5 tablets (12.5 mg total) by mouth daily. 45 tablet 1  . tetrahydrozoline 0.05 % ophthalmic solution Place 1-2 drops into both eyes daily as needed (irritated eyes.). visine     No current facility-administered medications on file prior to visit.    Allergies:   Allergies  Allergen Reactions  . Peanut Butter Flavor Anaphylaxis and Swelling  . Lisinopril Cough    Physical Exam General: Obese middle-age African-American lady seated, in no evident distress Head: head normocephalic and atraumatic.   Neck: supple with no carotid or supraclavicular bruits Cardiovascular: regular rate and rhythm, no murmurs Musculoskeletal: no deformity Skin:  no rash/petichiae Vascular:  Normal pulses all extremities  Neurologic Exam Mental Status: Awake and fully alert. Oriented to place and time. Recent and remote memory intact. Attention span, concentration and fund of knowledge appropriate. Mood and affect appropriate.  Cranial Nerves: Fundoscopic exam reveals sharp disc margins. Pupils equal, briskly reactive to light. Extraocular movements full without nystagmus. Visual fields full to confrontation. Hearing intact. Facial sensation intact. Face, tongue, palate moves normally and symmetrically.  Motor: Normal bulk and tone. Normal strength in all tested extremity muscles. Sensory.: intact to touch , pinprick , position and vibratory sensation.  Coordination: Rapid alternating movements normal in all extremities. Finger-to-nose and heel-to-shin performed accurately bilaterally. Gait and Station: Arises from chair  without difficulty. Stance is normal. Gait demonstrates normal stride length and balance .  Not able to heel, toe and tandem walk without difficulty.  Reflexes: 1+ and symmetric. Toes downgoing.   NIHSS  0 Modified Rankin  0   ASSESSMENT: 55 year old patient with episode of expressive aphasia due to left frontal MCA branch infarct of cryptogenic etiology.  Vascular risk factors of diabetes, hypertension hyperlipidemia, ischemic cardiomyopathy and mild obesity.    PLAN: I had a long d/w patient about her  recent cryptogenic stroke, risk for recurrent stroke/TIAs, personally independently reviewed imaging studies and stroke evaluation results and answered questions.Continue aspirin 81 mg daily  for secondary stroke prevention and discussed with cardiology whether she needs Plavix in addition as from stroke standpoint aspirin alone will be enough and maintain strict control of hypertension with blood pressure goal below 130/90, diabetes with hemoglobin A1c goal below 6.5% and lipids with LDL cholesterol goal below 70 mg/dL. I also advised the patient to eat a healthy diet with  plenty of whole grains, cereals, fruits and vegetables, exercise regularly and maintain ideal body weight.  Check lipid profile and hemoglobin A1c today.  Followup in the future with my nurse practitioner Janett Billow in 3 months or call earlier if necessary.  Greater than 50% time during this 45-minute consultation visit was spent in counseling and coordination of care about her cryptogenic stroke and answering questions. Antony Contras, MD Note: This document was prepared with digital dictation and possible smart phrase technology. Any transcriptional errors that result from this process are unintentional.

## 2020-12-11 NOTE — Telephone Encounter (Signed)
Pt c/o medication issue:  1. Name of Medication: Plavix   2. How are you currently taking this medication (dosage and times per day)?  75 mg po q d   3. Are you having a reaction (difficulty breathing--STAT)? no  4. What is your medication issue? Patient was told by neuro she did not need to be taking because she is already on asa 81 mg po q d and to call cards to discuss.

## 2020-12-12 ENCOUNTER — Ambulatory Visit: Payer: 59 | Admitting: Cardiology

## 2020-12-12 LAB — LIPID PANEL
Chol/HDL Ratio: 2.8 ratio (ref 0.0–4.4)
Cholesterol, Total: 113 mg/dL (ref 100–199)
HDL: 41 mg/dL (ref >39)
LDL Chol Calc (NIH): 59 mg/dL (ref 0–99)
Triglycerides: 60 mg/dL (ref 0–149)
VLDL Cholesterol Cal: 13 mg/dL (ref 5–40)

## 2020-12-12 LAB — HEMOGLOBIN A1C
Est. average glucose Bld gHb Est-mCnc: 140 mg/dL
Hgb A1c MFr Bld: 6.5 % — ABNORMAL HIGH (ref 4.8–5.6)

## 2020-12-13 ENCOUNTER — Encounter: Payer: Medicaid Other | Attending: Internal Medicine

## 2020-12-13 ENCOUNTER — Telehealth: Payer: Self-pay | Admitting: Neurology

## 2020-12-13 DIAGNOSIS — Z951 Presence of aortocoronary bypass graft: Secondary | ICD-10-CM | POA: Insufficient documentation

## 2020-12-13 NOTE — Telephone Encounter (Signed)
If neurology recommends discontinuation of clopidogrel, I think it is reasonable to stop clopidogrel and continue with aspirin 81 mg daily for secondary prevention.  [name], MD [facility]

## 2020-12-13 NOTE — Telephone Encounter (Signed)
Called the patient and advised the labs that were completed were in satisfactory range.  Informed the hemoglobin A1c has consistently been slightly elevated over the last 6 months.  Advised that her primary care should continue to monitor.  Patient verbalized understanding had no further questions.

## 2020-12-13 NOTE — Telephone Encounter (Signed)
Called patient and gave the below recommendations via VM per DPR on file from Dr. Saunders Revel. Requested that the patient call back if they have any further questions.

## 2020-12-13 NOTE — Telephone Encounter (Signed)
-----   Message from Darleen Crocker, RN sent at 12/13/2020 11:35 AM EDT -----  ----- Message ----- From: Garvin Fila, MD Sent: 12/13/2020   8:36 AM EDT To: Theotis Burrow, MD, Baldomero Lamy, RN  Kindly inform the patient that lab work for cholesterol profile and screening test for diabetes were both satisfactory.

## 2020-12-13 NOTE — Progress Notes (Signed)
Kindly inform the patient that lab work for cholesterol profile and screening test for diabetes were both satisfactory.

## 2020-12-14 DIAGNOSIS — Z951 Presence of aortocoronary bypass graft: Secondary | ICD-10-CM

## 2020-12-19 ENCOUNTER — Other Ambulatory Visit: Payer: Self-pay

## 2020-12-19 ENCOUNTER — Ambulatory Visit (INDEPENDENT_AMBULATORY_CARE_PROVIDER_SITE_OTHER): Payer: Medicaid Other | Admitting: Emergency Medicine

## 2020-12-19 DIAGNOSIS — I255 Ischemic cardiomyopathy: Secondary | ICD-10-CM | POA: Diagnosis not present

## 2020-12-19 LAB — CUP PACEART INCLINIC DEVICE CHECK
Battery Voltage: 3.12 V
Brady Statistic RV Percent Paced: 0 %
Date Time Interrogation Session: 20220412170627
HighPow Impedance: 48 Ohm
Implantable Lead Implant Date: 20220329
Implantable Lead Location: 753860
Implantable Lead Model: 436909
Implantable Lead Serial Number: 81437635
Implantable Pulse Generator Implant Date: 20220329
Lead Channel Impedance Value: 559 Ohm
Lead Channel Pacing Threshold Amplitude: 0.8 V
Lead Channel Pacing Threshold Pulse Width: 0.4 ms
Lead Channel Sensing Intrinsic Amplitude: 14.4 mV
Lead Channel Sensing Intrinsic Amplitude: 7.5 mV
Lead Channel Setting Pacing Amplitude: 3 V
Lead Channel Setting Pacing Pulse Width: 0.4 ms
Lead Channel Setting Sensing Sensitivity: 0.8 mV
Pulse Gen Model: 429525
Pulse Gen Serial Number: 84824217

## 2020-12-19 NOTE — Progress Notes (Signed)
Wound check appointment. Steri-strips removed. Wound without redness or edema. Incision edges approximated, wound well healed. Normal device function. Thresholds, sensing, and impedances consistent with implant measurements. Device programmed at 3.0V for extra safety margin until 3 month visit. Histogram distribution appropriate for patient and level of activity. No ventricular arrhythmias noted. Patient educated about wound care, arm mobility, lifting restrictions, shock plan. Patient is enrolled in remote monitoring, transmitting nightly.  Next scheduled summary report on 03/07/21.  ROV with Dr. Quentin Ore on 01/10/21.    Pt expressed anxiety with the possibly of being shocked.  Questions answered, pt appeared more at ease by end of visit.

## 2020-12-20 ENCOUNTER — Encounter: Payer: Self-pay | Admitting: Internal Medicine

## 2020-12-20 ENCOUNTER — Ambulatory Visit: Payer: 59 | Admitting: Internal Medicine

## 2020-12-20 NOTE — Progress Notes (Deleted)
Follow-up Outpatient Visit Date: 12/20/2020  Primary Care Provider: Theotis Burrow, MD [address] [place] [address]  Chief Complaint: ***  HPI:  [name] is a 55 y.o. female with history of coronary artery disease with critical left main stenosis status post CABG (LIMA to LAD and RIMA to OM1), chronic HFrEF due to ischemic cardiomyopathy, SVT, strokes, hypertension, hyperlipidemia, type 2 diabetes mellitus, and obesity, who presents for follow-up of CAD and HFrEF.  She was last seen in our office by Laurann Montana, NP, a month ago at which time she remained very anxious.  She denied chest pain, shortness of breath and palpitations.  She subsequently underwent SVT ablation and ICD placement by Dr. Quentin Ore on 12/05/2020.  --------------------------------------------------------------------------------------------------  Past Medical History:  Diagnosis Date  . CAD (coronary artery disease)    CABG (07/2020; LIMA-LAD, RIMA-OM1)  . CVA (cerebral vascular accident) ([place])   . Diabetes mellitus   . HFrEF (heart failure with reduced ejection fraction) ([place])   . HLD (hyperlipidemia)   . Hypertension   . SVT (supraventricular tachycardia) ([facility])    s/p ablation (11/2020)   Past Surgical History:  Procedure Laterality Date  . CARDIAC CATHETERIZATION    . CLIPPING OF ATRIAL APPENDAGE N/A 08/06/2020   Procedure: CLIPPING OF ATRIAL APPENDAGE USING ATRICURE CLIP SIZE 40MM;  Surgeon: Wonda Olds, MD;  Location: [facility];  Service: Open Heart Surgery;  Laterality: N/A;  . CORONARY ARTERY BYPASS GRAFT N/A 08/06/2020   Procedure: CORONARY ARTERY BYPASS GRAFTING (CABG) X2, USING BILATERAL INTERNAL MAMMARY ARTERIES;  Surgeon: Wonda Olds, MD;  Location: [facility];  Service: Open Heart Surgery;  Laterality: N/A;  . ICD IMPLANT N/A 12/05/2020   Procedure: ICD IMPLANT;  Surgeon: Vickie Epley, MD;  Location: [facility];  Service: Cardiovascular;  Laterality: N/A;   . KNEE SURGERY    . RIGHT/LEFT HEART CATH AND CORONARY ANGIOGRAPHY Bilateral 08/02/2020   Procedure: RIGHT/LEFT HEART CATH AND CORONARY ANGIOGRAPHY;  Surgeon: [name], MD;  Location: [facility];  Service: Cardiovascular;  Laterality: Bilateral;  . SVT ABLATION N/A 12/05/2020   Procedure: SVT ABLATION;  Surgeon: Vickie Epley, MD;  Location: [facility];  Service: Cardiovascular;  Laterality: N/A;  . TEE WITHOUT CARDIOVERSION N/A 07/18/2020   Procedure: TRANSESOPHAGEAL ECHOCARDIOGRAM (TEE);  Surgeon: [name], MD;  Location: [facility];  Service: Cardiovascular;  Laterality: N/A;  . TEE WITHOUT CARDIOVERSION N/A 08/06/2020   Procedure: TRANSESOPHAGEAL ECHOCARDIOGRAM (TEE);  Surgeon: Wonda Olds, MD;  Location: [facility];  Service: Open Heart Surgery;  Laterality: N/A;     Recent CV Pertinent Labs: Lab Results  Component Value Date   CHOL 113 12/11/2020   HDL 41 12/11/2020   LDLCALC 59 12/11/2020   TRIG 60 12/11/2020   CHOLHDL 2.8 12/11/2020   CHOLHDL 3.3 08/03/2020   INR 1.4 (H) 08/06/2020   BNP 26.5 07/20/2020   K 3.9 12/01/2020   MG 1.5 (L) 12/01/2020   BUN 12 12/01/2020   BUN 12 11/13/2020   CREATININE 0.56 12/01/2020    Past medical and surgical history were reviewed and updated in EPIC.  No outpatient medications have been marked as taking for the 12/20/20 encounter (Appointment) with Conna Terada, Harrell Gave, MD.    Allergies: Peanut butter flavor and Lisinopril  Social History   Tobacco Use  . Smoking status: Former Research scientist (life sciences)  . Smokeless tobacco: Never Used  . Tobacco comment: Quit 30 years ago  Vaping Use  .  Vaping Use: Never used  Substance Use Topics  . Alcohol use: Yes    Comment: social  . Drug use: No    Family History  Problem Relation Age of Onset  . Heart disease Mother        a. ?valve  . Heart failure Brother   . Hypertension Brother     Review of Systems: A 12-system review of systems was performed and was  negative except as noted in the HPI.  --------------------------------------------------------------------------------------------------  Physical Exam: LMP 11/21/2020   General:  NAD. Neck: No JVD or HJR. Lungs: Clear to auscultation bilaterally without wheezes or crackles. Heart: Regular rate and rhythm without murmurs, rubs, or gallops. Abdomen: Soft, nontender, nondistended. Extremities: No lower extremity edema.  EKG:  ***  Lab Results  Component Value Date   WBC 6.5 12/04/2020   HGB 9.7 (L) 12/04/2020   HCT 33.1 (L) 12/04/2020   MCV 74.9 (L) 12/04/2020   PLT 363 12/04/2020    Lab Results  Component Value Date   NA 135 12/01/2020   K 3.9 12/01/2020   CL 105 12/01/2020   CO2 22 12/01/2020   BUN 12 12/01/2020   CREATININE 0.56 12/01/2020   GLUCOSE 192 (H) 12/01/2020   ALT 15 10/08/2020    Lab Results  Component Value Date   CHOL 113 12/11/2020   HDL 41 12/11/2020   LDLCALC 59 12/11/2020   TRIG 60 12/11/2020   CHOLHDL 2.8 12/11/2020    --------------------------------------------------------------------------------------------------  ASSESSMENT AND PLAN: Harrell Gave Andrew Blasius, MD 12/20/2020 6:58 AM

## 2020-12-21 ENCOUNTER — Other Ambulatory Visit: Payer: Self-pay

## 2020-12-21 ENCOUNTER — Encounter: Payer: Medicaid Other | Admitting: *Deleted

## 2020-12-21 DIAGNOSIS — F064 Anxiety disorder due to known physiological condition: Secondary | ICD-10-CM

## 2020-12-21 DIAGNOSIS — Z951 Presence of aortocoronary bypass graft: Secondary | ICD-10-CM | POA: Diagnosis present

## 2020-12-21 DIAGNOSIS — Z95810 Presence of automatic (implantable) cardiac defibrillator: Secondary | ICD-10-CM

## 2020-12-21 DIAGNOSIS — I255 Ischemic cardiomyopathy: Secondary | ICD-10-CM

## 2020-12-21 NOTE — Progress Notes (Signed)
Daily Session Note  Patient Details  Name: Stacey Richardson MRN: [id number] Date of Birth: 04/27/1966 Referring Provider:   Flowsheet Row Cardiac Rehab from 09/19/2020 in [facility]  Referring Provider End      Encounter Date: 12/21/2020  Check In:  Session Check In - 12/21/20 1723      Check-In   Supervising physician immediately available to respond to emergencies See telemetry face sheet for immediately available ER MD    Location [facility]    Staff Present Renita Papa, RN BSN;Joseph Lou Miner, [place] Exercise Physiologist    Virtual Visit No    Medication changes reported     Yes    Comments off plavix    Fall or balance concerns reported    No    Warm-up and Cool-down Performed on first and last piece of equipment    Resistance Training Performed Yes    VAD Patient? No    PAD/SET Patient? No      Pain Assessment   Currently in Pain? No/denies              Social History   Tobacco Use  Smoking Status Former Smoker  Smokeless Tobacco Never Used  Tobacco Comment   Quit 30 years ago    Goals Met:  Independence with exercise equipment Exercise tolerated well No report of cardiac concerns or symptoms Strength training completed today  Goals Unmet:  Not Applicable  Comments: Pt able to follow exercise prescription today without complaint.  Will continue to monitor for progression.    Dr. Emily Filbert is Medical Director for [facility] and [facility].

## 2020-12-26 DIAGNOSIS — Z951 Presence of aortocoronary bypass graft: Secondary | ICD-10-CM

## 2020-12-27 ENCOUNTER — Encounter: Payer: Self-pay | Admitting: *Deleted

## 2020-12-27 ENCOUNTER — Other Ambulatory Visit: Payer: Self-pay

## 2020-12-27 ENCOUNTER — Encounter: Payer: Medicaid Other | Admitting: *Deleted

## 2020-12-27 DIAGNOSIS — Z951 Presence of aortocoronary bypass graft: Secondary | ICD-10-CM

## 2020-12-27 NOTE — Progress Notes (Signed)
Cardiac Individual Treatment Plan  Patient Details  Name: Stacey Richardson MRN: [id number] Date of Birth: 1965-10-10 Referring Provider:   Flowsheet Row Cardiac Rehab from 09/19/2020 in [facility] Cardiac and Pulmonary Rehab  Referring Provider End      Initial Encounter Date:  Flowsheet Row Cardiac Rehab from 09/19/2020 in [facility] Cardiac and Pulmonary Rehab  Date 09/19/20      Visit Diagnosis: S/P CABG x 2  Patient's Home Medications on Admission:  Current Outpatient Medications:  .  acetaminophen (TYLENOL) 500 MG tablet, Take 500 mg by mouth every 6 (six) hours as needed for moderate pain or headache., Disp: , Rfl:  .  alum & mag hydroxide-simeth (MAALOX ADVANCED MAX ST) 400-400-40 MG/5ML suspension, Take 15 mLs by mouth every 6 (six) hours as needed for indigestion., Disp: 355 mL, Rfl: 0 .  aspirin EC 81 MG tablet, Take 81 mg by mouth daily. , Disp: , Rfl:  .  atorvastatin (LIPITOR) 80 MG tablet, Take 80 mg by mouth at bedtime., Disp: , Rfl:  .  carvedilol (COREG) 12.5 MG tablet, Take 1 tablet (12.5 mg total) by mouth 2 (two) times daily., Disp: 60 tablet, Rfl: 5 .  losartan (COZAAR) 25 MG tablet, TAKE 1 TABLET (25 MG TOTAL) BY MOUTH DAILY., Disp: 30 tablet, Rfl: 2 .  metFORMIN (GLUCOPHAGE-XR) 500 MG 24 hr tablet, Take 500 mg by mouth daily with breakfast., Disp: , Rfl:  .  spironolactone (ALDACTONE) 25 MG tablet, Take 0.5 tablets (12.5 mg total) by mouth daily., Disp: 45 tablet, Rfl: 1 .  tetrahydrozoline 0.05 % ophthalmic solution, Place 1-2 drops into both eyes daily as needed (irritated eyes.). visine, Disp: , Rfl:   Past Medical History: Past Medical History:  Diagnosis Date  . CAD (coronary artery disease)    CABG (07/2020; LIMA-LAD, RIMA-OM1)  . CVA (cerebral vascular accident) ([place])   . Diabetes mellitus   . HFrEF (heart failure with reduced ejection fraction) ([place])   . HLD (hyperlipidemia)   . Hypertension   . SVT (supraventricular tachycardia) ([facility])    s/p ablation (11/2020)     Tobacco Use: Social History   Tobacco Use  Smoking Status Former Smoker  Smokeless Tobacco Never Used  Tobacco Comment   Quit 30 years ago    Labs: Recent Chemical engineer    Labs for ITP Cardiac and Pulmonary Rehab Latest Ref Rng & Units 08/06/2020 08/06/2020 08/07/2020 08/08/2020 12/11/2020   Cholestrol 100 - 199 mg/dL - - - - 113   LDLCALC 0 - 99 mg/dL - - - - 59   HDL >39 mg/dL - - - - 41   Trlycerides 0 - 149 mg/dL - - - - 60   Hemoglobin A1c 4.8 - 5.6 % - - - - 6.5(H)   PHART 7.350 - 7.450 7.328(L) 7.321(L) - - -   PCO2ART 32.0 - 48.0 mmHg 37.0 38.5 - - -   HCO3 20.0 - 28.0 mmol/L 19.4(L) 19.9(L) - - -   TCO2 22 - 32 mmol/L 20(L) 21(L) - - -   ACIDBASEDEF 0.0 - 2.0 mmol/L 6.0(H) 6.0(H) - - -   O2SAT % 99.0 99.0 74.6 62.1 -       Exercise Target Goals: Exercise Program Goal: Individual exercise prescription set using results from initial 6 min walk test and THRR while considering  patient's activity barriers and safety.   Exercise Prescription Goal: Initial exercise prescription builds to 30-45 minutes a day of aerobic activity, 2-3 days per week.  Home exercise  guidelines will be given to patient during program as part of exercise prescription that the participant will acknowledge.   Education: Aerobic Exercise: - Group verbal and visual presentation on the components of exercise prescription. Introduces F.I.T.T principle from ACSM for exercise prescriptions.  Reviews F.I.T.T. principles of aerobic exercise including progression. Written material given at graduation.   Education: Resistance Exercise: - Group verbal and visual presentation on the components of exercise prescription. Introduces F.I.T.T principle from ACSM for exercise prescriptions  Reviews F.I.T.T. principles of resistance exercise including progression. Written material given at graduation.    Education: Exercise & Equipment Safety: - Individual verbal instruction and demonstration of  equipment use and safety with use of the equipment. Flowsheet Row Cardiac Rehab from 11/15/2020 in [facility] Cardiac [facility] Pulmonary Rehab  Date 09/19/20  Educator AS  Instruction Review Code 1- Verbalizes Understanding      Education: Exercise Physiology & General Exercise Guidelines: - Group verbal and written instruction with models to review the exercise physiology of the cardiovascular system and associated critical values. Provides general exercise guidelines with specific guidelines to those with heart or lung disease.    Education: Flexibility, Balance, Mind/Body Relaxation: - Group verbal and visual presentation with interactive activity on the components of exercise prescription. Introduces F.I.T.T principle from ACSM for exercise prescriptions. Reviews F.I.T.T. principles of flexibility and balance exercise training including progression. Also discusses the mind body connection.  Reviews various relaxation techniques to help reduce and manage stress (i.e. Deep breathing, progressive muscle relaxation, and visualization). Balance handout provided to take home. Written material given at graduation. Flowsheet Row Cardiac Rehab from 11/15/2020 in [facility] Cardiac and Pulmonary Rehab  Date 11/08/20  Educator AS  Instruction Review Code 1- Verbalizes Understanding      Activity Barriers & Risk Stratification:  Activity Barriers & Cardiac Risk Stratification - 09/11/20 1142      Activity Barriers & Cardiac Risk Stratification   Activity Barriers None    Cardiac Risk Stratification Moderate           6 Minute Walk:  6 Minute Walk    Row Name 09/19/20 1453         6 Minute Walk   Phase Initial     Distance 1365 feet     Walk Time 6 minutes     # of Rest Breaks 0     MPH 2.58     METS 4.5     RPE 11     Perceived Dyspnea  2     VO2 Peak 15.77     Symptoms No     Resting HR 91 bpm     Resting BP 110/66     Resting Oxygen Saturation  99 %     Exercise Oxygen Saturation  during 6  min walk 98 %     Max Ex. HR 141 bpm     Max Ex. BP 154/82     2 Minute Post BP 116/68            Oxygen Initial Assessment:   Oxygen Re-Evaluation:   Oxygen Discharge (Final Oxygen Re-Evaluation):   Initial Exercise Prescription:  Initial Exercise Prescription - 09/19/20 1400      Date of Initial Exercise RX and Referring Provider   Date 09/19/20    Referring Provider End      Treadmill   MPH 2.6    Grade 2.5    Minutes 15    METs 3.9      Recumbant Bike  Level 2    RPM 60    Watts 50    Minutes 15    METs 4      NuStep   Level 3    SPM 80    Minutes 15    METs 4      REL-XR   Level 2    Speed 50    Minutes 15    METs 4      T5 Nustep   Level 2    SPM 80    Minutes 15    METs 4      Prescription Details   Frequency (times per week) 3    Duration Progress to 30 minutes of continuous aerobic without signs/symptoms of physical distress      Intensity   THRR 40-80% of Max Heartrate 121-152    Ratings of Perceived Exertion 11-13    Perceived Dyspnea 0-4      Resistance Training   Training Prescription Yes    Weight 3 lb    Reps 10-15           Perform Capillary Blood Glucose checks as needed.  Exercise Prescription Changes:  Exercise Prescription Changes    Row Name 09/19/20 1400 10/04/20 1500 11/01/20 1100 11/15/20 1200       Response to Exercise   Blood Pressure (Admit) 110/66 122/70 110/62 126/74    Blood Pressure (Exercise) 154/82 152/66 172/70 152/62    Blood Pressure (Exit) 116/68 110/66 110/60 106/60    Heart Rate (Admit) 91 bpm 100 bpm 109 bpm 104 bpm    Heart Rate (Exercise) 141 bpm 134 bpm 128 bpm 147 bpm    Heart Rate (Exit) 90 bpm 109 bpm 102 bpm 88 bpm    Oxygen Saturation (Admit) 99 % -- -- --    Oxygen Saturation (Exercise) 98 % -- -- --    Rating of Perceived Exertion (Exercise) 11 14 13 16     Perceived Dyspnea (Exercise) 2 -- -- --    Symptoms none none none none    Duration -- Continue with 30 min of aerobic  exercise without signs/symptoms of physical distress. Continue with 30 min of aerobic exercise without signs/symptoms of physical distress. Continue with 30 min of aerobic exercise without signs/symptoms of physical distress.    Intensity -- THRR unchanged THRR unchanged THRR unchanged         Progression   Progression -- Continue to progress workloads to maintain intensity without signs/symptoms of physical distress. Continue to progress workloads to maintain intensity without signs/symptoms of physical distress. Continue to progress workloads to maintain intensity without signs/symptoms of physical distress.    Average METs -- 2.85 2.45 3.26         Resistance Training   Training Prescription -- Yes Yes Yes    Weight -- 3 lb 3 lb 4 lb    Reps -- 10-15 10-15 10-15         Interval Training   Interval Training -- No No No         Treadmill   MPH -- 2.6 2.4 2.6    Grade -- 2.5 2 2     Minutes -- 15 15 15     METs -- 3.89 3.5 3.89         Recumbant Elliptical   Level -- -- 1 --    Minutes -- -- 15 --    METs -- -- 1.4 --         T5 Nustep  Level -- 2 -- --    Minutes -- 15 -- --    METs -- 1.8 -- --           Exercise Comments:  Exercise Comments    Row Name 09/21/20 1458           Exercise Comments First full day of exercise!  Patient was oriented to gym and equipment including functions, settings, policies, and procedures.  Patient's individual exercise prescription and treatment plan were reviewed.  All starting workloads were established based on the results of the 6 minute walk test done at initial orientation visit.  The plan for exercise progression was also introduced and progression will be customized based on patient's performance and goals.              Exercise Goals and Review:  Exercise Goals    Row Name 09/19/20 1458             Exercise Goals   Increase Physical Activity Yes       Intervention Provide advice, education, support and  counseling about physical activity/exercise needs.;Develop an individualized exercise prescription for aerobic and resistive training based on initial evaluation findings, risk stratification, comorbidities and participant's personal goals.       Expected Outcomes Short Term: Attend rehab on a regular basis to increase amount of physical activity.;Long Term: Add in home exercise to make exercise part of routine and to increase amount of physical activity.;Long Term: Exercising regularly at least 3-5 days a week.       Increase Strength and Stamina Yes       Intervention Provide advice, education, support and counseling about physical activity/exercise needs.;Develop an individualized exercise prescription for aerobic and resistive training based on initial evaluation findings, risk stratification, comorbidities and participant's personal goals.       Expected Outcomes Short Term: Increase workloads from initial exercise prescription for resistance, speed, and METs.;Short Term: Perform resistance training exercises routinely during rehab and add in resistance training at home;Long Term: Improve cardiorespiratory fitness, muscular endurance and strength as measured by increased METs and functional capacity (6MWT)       Able to understand and use rate of perceived exertion (RPE) scale Yes       Intervention Provide education and explanation on how to use RPE scale       Expected Outcomes Short Term: Able to use RPE daily in rehab to express subjective intensity level;Long Term:  Able to use RPE to guide intensity level when exercising independently       Able to understand and use Dyspnea scale Yes       Intervention Provide education and explanation on how to use Dyspnea scale       Expected Outcomes Short Term: Able to use Dyspnea scale daily in rehab to express subjective sense of shortness of breath during exertion;Long Term: Able to use Dyspnea scale to guide intensity level when exercising independently        Knowledge and understanding of Target Heart Rate Range (THRR) Yes       Intervention Provide education and explanation of THRR including how the numbers were predicted and where they are located for reference       Expected Outcomes Short Term: Able to state/look up THRR;Short Term: Able to use daily as guideline for intensity in rehab;Long Term: Able to use THRR to govern intensity when exercising independently       Able to check pulse independently Yes  Intervention Provide education and demonstration on how to check pulse in carotid and radial arteries.;Review the importance of being able to check your own pulse for safety during independent exercise       Expected Outcomes Short Term: Able to explain why pulse checking is important during independent exercise;Long Term: Able to check pulse independently and accurately       Understanding of Exercise Prescription Yes       Intervention Provide education, explanation, and written materials on patient's individual exercise prescription       Expected Outcomes Short Term: Able to explain program exercise prescription;Long Term: Able to explain home exercise prescription to exercise independently              Exercise Goals Re-Evaluation :  Exercise Goals Re-Evaluation    Row Name 09/21/20 1458 10/04/20 1526 10/30/20 1409 11/01/20 1140 11/15/20 1211     Exercise Goal Re-Evaluation   Exercise Goals Review Able to understand and use rate of perceived exertion (RPE) scale;Able to understand and use Dyspnea scale;Knowledge and understanding of Target Heart Rate Range (THRR);Understanding of Exercise Prescription Increase Physical Activity;Increase Strength and Stamina;Understanding of Exercise Prescription Increase Physical Activity;Increase Strength and Stamina Increase Physical Activity;Increase Strength and Stamina;Understanding of Exercise Prescription Increase Physical Activity;Increase Strength and Stamina   Comments Reviewed RPE  and dyspnea scales, THR and program prescription with pt today.  Pt voiced understanding and was given a copy of goals to take home. Analisa is off to a good start to rehab.  She has completed her first two full days of rehab.  We will continue to monitor her progress. Elaya has a TM at home.  Staff have not reviewed home exercise yet. Hosanna is doing well in rehab.  Her attendance has gotten better recently.  We will review home exercise guideline with her.  She is up to 2.6 mph on the treadmill.  We will continue to monitor her progress. Donnice is progressing well. She has incresaed speed on TM and weight to 4 lb for strength work.  We will continue to monitor progress.   Expected Outcomes Short: Use RPE daily to regulate intensity. Long: Follow program prescription in THR. Short: Continue to attend regularly Long: Continue to follow program prescription Short:  review home exercise with staff Long:  improve stamina Short:  review home exercise with staff Long:  improve stamina Short: contiue to attend consistently Long: improve overall stamina   Row Name 11/29/20 1150 12/21/20 1748           Exercise Goal Re-Evaluation   Exercise Goals Review Understanding of Exercise Prescription Increase Physical Activity;Increase Strength and Stamina      Comments Aldina has started a new job and been out since last review.  She is supposed to return this week at a new class time. Elinora has been on her treadmill at home little by little. She has returned to rehab to continue exercise after having an ICD placement.      Expected Outcomes Short: contiue to attend consistently Long: improve overall stamina Short: continue rehab to regain strength. Long: maintain exercise post [facility].             Discharge Exercise Prescription (Final Exercise Prescription Changes):  Exercise Prescription Changes - 11/15/20 1200      Response to Exercise   Blood Pressure (Admit) 126/74    Blood Pressure (Exercise)  152/62    Blood Pressure (Exit) 106/60    Heart Rate (Admit) 104 bpm  Heart Rate (Exercise) 147 bpm    Heart Rate (Exit) 88 bpm    Rating of Perceived Exertion (Exercise) 16    Symptoms none    Duration Continue with 30 min of aerobic exercise without signs/symptoms of physical distress.    Intensity THRR unchanged      Progression   Progression Continue to progress workloads to maintain intensity without signs/symptoms of physical distress.    Average METs 3.26      Resistance Training   Training Prescription Yes    Weight 4 lb    Reps 10-15      Interval Training   Interval Training No      Treadmill   MPH 2.6    Grade 2    Minutes 15    METs 3.89           Nutrition:  Target Goals: Understanding of nutrition guidelines, daily intake of sodium 1500mg , cholesterol 200mg , calories 30% from fat and 7% or less from saturated fats, daily to have 5 or more servings of fruits and vegetables.  Education: All About Nutrition: -Group instruction provided by verbal, written material, interactive activities, discussions, models, and posters to present general guidelines for heart healthy nutrition including fat, fiber, MyPlate, the role of sodium in heart healthy nutrition, utilization of the nutrition label, and utilization of this knowledge for meal planning. Follow up email sent as well. Written material given at graduation. Flowsheet Row Cardiac Rehab from 11/15/2020 in [facility] Cardiac and Pulmonary Rehab  Date 11/15/20  Educator [facility]  Instruction Review Code 1- Verbalizes Understanding      Biometrics:  Pre Biometrics - 09/19/20 1459      Pre Biometrics   Height 5' 6.5" (1.689 m)    Weight 186 lb (84.4 kg)    BMI (Calculated) 29.57    Single Leg Stand 19.26 seconds            Nutrition Therapy Plan and Nutrition Goals:  Nutrition Therapy & Goals - 10/30/20 1439      Nutrition Therapy   Diet Heart healhty, low Na, diabetes friendly    Drug/Food Interactions  Statins/Certain Fruits    Protein (specify units) 65g    Fiber 25 grams    Whole Grain Foods 3 servings    Saturated Fats 12 max. grams    Fruits and Vegetables 8 servings/day    Sodium 1.5 grams      Personal Nutrition Goals   Nutrition Goal ST: practice counting carbohydrates, use MyPlate structure and count carbohydrates (3-4 servings for meals, 1-2 for snacks) LT: Be independent managing diabetes    Comments 2 meals - lunch and dinner. Sometimes will have boiled egg and toast (white wheat) - she likes whole wheat bread. L: tuna or cheese toast (whole wheat) D: take out - tries to get baked chicken, mashed poatoes, and sweet potatoes, brussells sprouts. Discussed heart healthy eating and diabetes friendly eating. Discussed which foods were CHO servings and how to count CHOs servings.      Intervention Plan   Intervention Prescribe, educate and counsel regarding individualized specific dietary modifications aiming towards targeted core components such as weight, hypertension, lipid management, diabetes, heart failure and other comorbidities.;Nutrition handout(s) given to patient.    Expected Outcomes Short Term Goal: Understand basic principles of dietary content, such as calories, fat, sodium, cholesterol and nutrients.;Short Term Goal: A plan has been developed with personal nutrition goals set during dietitian appointment.;Long Term Goal: Adherence to prescribed nutrition plan.  Nutrition Assessments:  MEDIFICTS Score Key:  ?70 Need to make dietary changes   40-70 Heart Healthy Diet  ? 40 Therapeutic Level Cholesterol Diet  Flowsheet Row Cardiac Rehab from 09/19/2020 in [facility] Cardiac and Pulmonary Rehab  Picture Your Plate Total Score on Admission 44     Picture Your Plate Scores:  <03 Unhealthy dietary pattern with much room for improvement.  41-50 Dietary pattern unlikely to meet recommendations for good health and room for improvement.  51-60 More healthful  dietary pattern, with some room for improvement.   >60 Healthy dietary pattern, although there may be some specific behaviors that could be improved.    Nutrition Goals Re-Evaluation:  Nutrition Goals Re-Evaluation    [place] Name 11/15/20 1449 12/21/20 1807           Goals   Current Weight -- 187 lb (84.8 kg)      Nutrition Goal ST: practice counting carbohydrates, use MyPlate structure and count carbohydrates (3-4 servings for meals, 1-2 for snacks) LT: Be independent managing diabetes Lose Weight, have a diet plan      Comment Azura hasn't been able to work on her goals set with the dietician due to increase of stress in her life. She reports wanting to start working on these goals now and already has some meal plans in place. Dejae wants to meet with the dietician to go over her diet habits. She has alot of questions about the things she is eating.      Expected Outcome Short: incorporate the goals set with the dietician of counting carbs. Long: maintain independence with managing heart healthy diet Short: spealk with the dietician. Long: maintain a diet that pertains to her             Nutrition Goals Discharge (Final Nutrition Goals Re-Evaluation):  Nutrition Goals Re-Evaluation - 12/21/20 1807      Goals   Current Weight 187 lb (84.8 kg)    Nutrition Goal Lose Weight, have a diet plan    Comment Shatima wants to meet with the dietician to go over her diet habits. She has alot of questions about the things she is eating.    Expected Outcome Short: spealk with the dietician. Long: maintain a diet that pertains to her           Psychosocial: Target Goals: Acknowledge presence or absence of significant depression and/or stress, maximize coping skills, provide positive support system. Participant is able to verbalize types and ability to use techniques and skills needed for reducing stress and depression.   Education: Stress, Anxiety, and Depression - Group verbal and visual  presentation to define topics covered.  Reviews how body is impacted by stress, anxiety, and depression.  Also discusses healthy ways to reduce stress and to treat/manage anxiety and depression.  Written material given at graduation.   Education: Sleep Hygiene -Provides group verbal and written instruction about how sleep can affect your health.  Define sleep hygiene, discuss sleep cycles and impact of sleep habits. Review good sleep hygiene tips.    Initial Review & Psychosocial Screening:  Initial Psych Review & Screening - 09/11/20 1145      Initial Review   Current issues with Current Anxiety/Panic;Current Stress Concerns    Source of Stress Concerns Chronic Illness    Comments Stress over what her surgery and her recovery will do to her Diabetes, getting back to work safely and understanding all that happened.      Family Dynamics   Good  Support System? Yes   daughter, brothers.     Barriers   Psychosocial barriers to participate in program There are no identifiable barriers or psychosocial needs.      Screening Interventions   Interventions Encouraged to exercise;To provide support and resources with identified psychosocial needs    Expected Outcomes Short Term goal: Utilizing psychosocial counselor, staff and physician to assist with identification of specific Stressors or current issues interfering with healing process. Setting desired goal for each stressor or current issue identified.;Long Term Goal: Stressors or current issues are controlled or eliminated.;Short Term goal: Identification and review with participant of any Quality of Life or Depression concerns found by scoring the questionnaire.;Long Term goal: The participant improves quality of Life and PHQ9 Scores as seen by post scores and/or verbalization of changes           Quality of Life Scores:   Quality of Life - 09/19/20 1500      Quality of Life   Select Quality of Life      Quality of Life Scores    Health/Function Pre 21.8 %    Socioeconomic Pre 17.44 %    Psych/Spiritual Pre 24 %    Family Pre 25.8 %    GLOBAL Pre 21.8 %          Scores of 19 and below usually indicate a poorer quality of life in these areas.  A difference of  2-3 points is a clinically meaningful difference.  A difference of 2-3 points in the total score of the Quality of Life Index has been associated with significant improvement in overall quality of life, self-image, physical symptoms, and general health in studies assessing change in quality of life.  PHQ-9: Recent Review Flowsheet Data    Depression screen [facility] 2/9 12/21/2020 09/19/2020   Decreased Interest 0 1   Down, Depressed, Hopeless 1 1   PHQ - 2 Score 1 2   Altered sleeping 1 1   Tired, decreased energy 1 1   Change in appetite 1 1   Feeling bad or failure about yourself  1 0   Trouble concentrating 0 0   Moving slowly or fidgety/restless 0 0   Suicidal thoughts 0 0   PHQ-9 Score 5 5   Difficult doing work/chores Not difficult at all Not difficult at all     Interpretation of Total Score  Total Score Depression Severity:  1-4 = Minimal depression, 5-9 = Mild depression, 10-14 = Moderate depression, 15-19 = Moderately severe depression, 20-27 = Severe depression   Psychosocial Evaluation and Intervention:  Psychosocial Evaluation - 09/11/20 1202      Psychosocial Evaluation & Interventions   Interventions Encouraged to exercise with the program and follow exercise prescription;Relaxation education    Comments Zipporah has no barriers to entry to the program. She has some anxiety about her chest "opening up if she pushes to hard". Spoke to her that we have never seen this happen here as  we will start slow and work until she feels healed before increasing the her workloads.  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 We spoke about her talking with her HR department to see how she might be able to start with light duty until she  feels strong enough to get back to her norm at work. She  has a good support system with her daughter and her 2 brothers. She is ready to get started and learn about her heart disease,  learn the exercise and nutriton steps to lead to a healthy lifestyle and continue to work on managing her diabetes. Silvia should do well in the program.    Expected Outcomes STG: Attends all scheduled sessions:exercise and education. Continues to verbalize any stress or fears   LTG: Libni able to utilize what she has learned to maintain a healthy lifestyle and has been able to diminish her stress and fears about her heart disease .    Continue Psychosocial Services  Follow up required by staff           Psychosocial Re-Evaluation:  Psychosocial Re-Evaluation    [place] Name 10/30/20 1400 11/15/20 1438 12/21/20 1804         Psychosocial Re-Evaluation   Current issues with Current Stress Concerns;Current Sleep Concerns Current Stress Concerns;Current Sleep Concerns History of Depression;Current Anxiety/Panic     Comments Cathy is still stressed out some about her health.  She feels good about attending HT and the education.  She doesnt want to go back to work yet.  Staff suggested she talk to HR at her employer about disability.  She sleeps well sometimes - she does have a prescirption for sleep medication.  She will ask Dr about melatonin. Susana's anxiety has been hightened these last few weeks as she has gone to her follow up doctor appointments and didn't receive the news she was hoping. Her EF has stayed the same at 30-35% which means they are talking about placing an ICD and trialing new medications. They already placed a loop recorder and she continues ot have bouts of SVT. She is very anxious about her prognosis and feels like she has been given a death sentence. She wants to do anything she can to help her live a long life. Information was given by staff and by her cardiologist about ICD and heart failure.  She found a new work from home job and starts training next week. She is hoping she can manage that schedule. Her sleep has taken a hit given all the stress, she has been scared to try the clonidine her MD gave her but may try 1/2 a pill since she is so tired from stressing and not sleeping. Her family is supportive, although she states one of her brothers can be a little much when it comes to giving her advice. She is too scared to seek out a personal relationship because she doesn't know what her prognosis is. Reviewed patient health questionnaire (PHQ-9) with patient for follow up. Previously, patients score indicated signs/symptoms of depression.  Reviewed to see if patient is improving symptom wise while in program.  Score stayed the same. She has some anxiety about her new ICD. Informed her about the machine and what it is used for.     Expected Outcomes Short:  follow up with Dr about sleep meds Long: develop good stress management and sleep habits Short: follow up with EP on 3/23 to discuss ICD placement and options, try taking medication to sleep. Long: develop and maintain stress management techniques while continuing to become educated on her diagnosis Short: Continue to work toward an improvement in [place] by attending [facility] regularly. Long: Continue to improve stress and depression coping skills by talking with staff and attending [facility] regularly and work toward a positive mental state.     Interventions -- Relaxation education;Encouraged to attend Cardiac Rehabilitation for the exercise;Stress management education Encouraged to attend Cardiac Rehabilitation for the exercise     Continue Psychosocial Services  --  Follow up required by staff Follow up required by staff            Psychosocial Discharge (Final Psychosocial Re-Evaluation):  Psychosocial Re-Evaluation - 12/21/20 1804      Psychosocial Re-Evaluation   Current issues with History of Depression;Current Anxiety/Panic     Comments Reviewed patient health questionnaire (PHQ-9) with patient for follow up. Previously, patients score indicated signs/symptoms of depression.  Reviewed to see if patient is improving symptom wise while in program.  Score stayed the same. She has some anxiety about her new ICD. Informed her about the machine and what it is used for.    Expected Outcomes Short: Continue to work toward an improvement in [place] scores by attending [facility] regularly. Long: Continue to improve stress and depression coping skills by talking with staff and attending [facility] regularly and work toward a positive mental state.    Interventions Encouraged to attend Cardiac Rehabilitation for the exercise    Continue Psychosocial Services  Follow up required by staff           Vocational Rehabilitation: Provide vocational rehab assistance to qualifying candidates.   Vocational Rehab Evaluation & Intervention:  Vocational Rehab - 09/11/20 1153      Initial Vocational Rehab Evaluation & Intervention   Assessment shows need for Vocational Rehabilitation No           Education: Education Goals: Education classes will be provided on a variety of topics geared toward better understanding of heart health and risk factor modification. Participant will state understanding/return demonstration of topics presented as noted by education test scores.  Learning Barriers/Preferences:  Learning Barriers/Preferences - 09/11/20 1152      Learning Barriers/Preferences   Learning Barriers None    Learning Preferences Video           General Cardiac Education Topics:  AED/CPR: - Group verbal and written instruction with the use of models to demonstrate the basic use of the AED with the basic ABC's of resuscitation.   Anatomy and Cardiac Procedures: - Group verbal and visual presentation and models provide information about basic cardiac anatomy and function. Reviews the testing methods done to diagnose heart  disease and the outcomes of the test results. Describes the treatment choices: Medical Management, Angioplasty, or Coronary Bypass Surgery for treating various heart conditions including Myocardial Infarction, Angina, Valve Disease, and Cardiac Arrhythmias.  Written material given at graduation.   Medication Safety: - Group verbal and visual instruction to review commonly prescribed medications for heart and lung disease. Reviews the medication, class of the drug, and side effects. Includes the steps to properly store meds and maintain the prescription regimen.  Written material given at graduation.   Intimacy: - Group verbal instruction through game format to discuss how heart and lung disease can affect sexual intimacy. Written material given at graduation..   Know Your Numbers and Heart Failure: - Group verbal and visual instruction to discuss disease risk factors for cardiac and pulmonary disease and treatment options.  Reviews associated critical values for Overweight/Obesity, Hypertension, Cholesterol, and Diabetes.  Discusses basics of heart failure: signs/symptoms and treatments.  Introduces Heart Failure Zone chart for action plan for heart failure.  Written material given at graduation.   Infection Prevention: - Provides verbal and written material to individual with discussion of infection control including proper hand washing and proper equipment cleaning during exercise session. Flowsheet Row Cardiac Rehab from 11/15/2020 in [facility] Cardiac and Pulmonary Rehab  Date 09/19/20  Educator AS  Instruction Review  Code 1- Verbalizes Understanding      Falls Prevention: - Provides verbal and written material to individual with discussion of falls prevention and safety. Flowsheet Row Cardiac Rehab from 11/15/2020 in [facility] Cardiac and Pulmonary Rehab  Date 09/19/20  Educator AS  Instruction Review Code 1- Verbalizes Understanding      Other: -Provides group and verbal instruction on  various topics (see comments)   Knowledge Questionnaire Score:  Knowledge Questionnaire Score - 09/19/20 1501      Knowledge Questionnaire Score   Pre Score 23/26 angina nutrition           Core Components/Risk Factors/Patient Goals at Admission:  Personal Goals and Risk Factors at Admission - 09/19/20 1459      Core Components/Risk Factors/Patient Goals on Admission    Weight Management Weight Loss;Yes    Intervention Weight Management/Obesity: Establish reasonable short term and long term weight goals.;Weight Management: Develop a combined nutrition and exercise program designed to reach desired caloric intake, while maintaining appropriate intake of nutrient and fiber, sodium and fats, and appropriate energy expenditure required for the weight goal.;Weight Management: Provide education and appropriate resources to help participant work on and attain dietary goals.    Admit Weight 186 lb (84.4 kg)    Goal Weight: Short Term 180 lb (81.6 kg)    Goal Weight: Long Term 160 lb (72.6 kg)    Expected Outcomes Short Term: Continue to assess and modify interventions until short term weight is achieved;Long Term: Adherence to nutrition and physical activity/exercise program aimed toward attainment of established weight goal;Weight Loss: Understanding of general recommendations for a balanced deficit meal plan, which promotes 1-2 lb weight loss per week and includes a negative energy balance of [phone] kcal/d    Diabetes Yes    Intervention Provide education about signs/symptoms and action to take for hypo/hyperglycemia.;Provide education about proper nutrition, including hydration, and aerobic/resistive exercise prescription along with prescribed medications to achieve blood glucose in normal ranges: Fasting glucose 65-99 mg/dL    Expected Outcomes Short Term: Participant verbalizes understanding of the signs/symptoms and immediate care of hyper/hypoglycemia, proper foot care and importance of  medication, aerobic/resistive exercise and nutrition plan for blood glucose control.;Long Term: Attainment of HbA1C < 7%.    Intervention Provide education on lifestyle modifcations including regular physical activity/exercise, weight management, moderate sodium restriction and increased consumption of fresh fruit, vegetables, and low fat dairy, alcohol moderation, and smoking cessation.;Monitor prescription use compliance.    Expected Outcomes Short Term: Continued assessment and intervention until BP is < 140/16mm HG in hypertensive participants. < 130/54mm HG in hypertensive participants with diabetes, heart failure or chronic kidney disease.;Long Term: Maintenance of blood pressure at goal levels.    Lipids Yes    Intervention Provide education and support for participant on nutrition & aerobic/resistive exercise along with prescribed medications to achieve LDL 70mg , HDL >40mg .    Expected Outcomes Short Term: Participant states understanding of desired cholesterol values and is compliant with medications prescribed. Participant is following exercise prescription and nutrition guidelines.;Long Term: Cholesterol controlled with medications as prescribed, with individualized exercise RX and with personalized nutrition plan. Value goals: LDL < 70mg , HDL > 40 mg.           Education:Diabetes - Individual verbal and written instruction to review signs/symptoms of diabetes, desired ranges of glucose level fasting, after meals and with exercise. Acknowledge that pre and post exercise glucose checks will be done for 3 sessions at entry of program. Flowsheet Row Cardiac Rehab from  11/15/2020 in [facility] Cardiac and Pulmonary Rehab  Date 09/19/20  Educator AS  Instruction Review Code 1- Verbalizes Understanding      Core Components/Risk Factors/Patient Goals Review:   Goals and Risk Factor Review    Row Name 10/30/20 1356 11/15/20 1429 12/21/20 1757         Core Components/Risk Factors/Patient Goals  Review   Personal Goals Review Weight Management/Obesity;Hypertension;Lipids;Diabetes Weight Management/Obesity;Hypertension;Diabetes;Heart Failure Weight Management/Obesity;Diabetes;Hypertension     Review Kenzie is monitoring BP and BG at home.  Her normal FBG is around 90-95.  She also monitors BP at home .  Today was 110/62 at [facility].  She is taking all medications as directed. Carys's BP is stable and is monitoring it while the cardiologist is trialing medications for her heart failure. Her diabetes is being managed well, so well that her MD has decreased her diabetic medication. She recently had a repeat echo and her EF was 30-35%, which was unchanged. Since it did not improve and she is still having runs of SVT they are talking with her about getting an ICD and has appointment with the electrophysiologist Marvette wants to get down to 160 pounds. She has returned to rehab after ICD and is ready to continue. Her blood sugars have been 109 in the morning fasting. She feels like she has her sugar in check. Her blood pressure has been stable and is checking it at home.     Expected Outcomes Short: continue to monitor vitals at home Long:  manage risk factors long term Short: attend EP appointment to discuss ICD on 3/23 Long: continue managing BP and Diabetes, work with MD on plan for HF Short: lose 5 pounds in the next 2 weeks. Long: reach a weight goal of 160 pounds.            Core Components/Risk Factors/Patient Goals at Discharge (Final Review):   Goals and Risk Factor Review - 12/21/20 1757      Core Components/Risk Factors/Patient Goals Review   Personal Goals Review Weight Management/Obesity;Diabetes;Hypertension    Review Cystal wants to get down to 160 pounds. She has returned to rehab after ICD and is ready to continue. Her blood sugars have been 109 in the morning fasting. She feels like she has her sugar in check. Her blood pressure has been stable and is checking it at home.    Expected  Outcomes Short: lose 5 pounds in the next 2 weeks. Long: reach a weight goal of 160 pounds.           ITP Comments:  ITP Comments    Row Name 09/11/20 1222 09/19/20 1506 09/21/20 1458 10/04/20 0858 10/19/20 1458   ITP Comments Virtual orientation call completed today. shehas an appointment on Date: 09/13/2020  for EP eval and gym Orientation.  Documentation of diagnosis [facility] in [facility] Date: 08/02/2020. Completed 6MWT and gym orientation. Initial ITP created and sent for review to Dr. Emily Filbert, Medical Director. First full day of exercise!  Patient was oriented to gym and equipment including functions, settings, policies, and procedures.  Patient's individual exercise prescription and treatment plan were reviewed.  All starting workloads were established based on the results of the 6 minute walk test done at initial orientation visit.  The plan for exercise progression was also introduced and progression will be customized based on patient's performance and goals. 30 Day review completed. Medical Director ITP review done, changes made as directed, and signed approval by Medical Director.  New to program  Seretha stopped by today to inform staff she had a loop recorder placed yesterday and she didn't feel comfortable exercising. She was nervous about it and everthing that is going on with her heart. Information and support given to her as she tries to figure everything out. She plans to return Monday as she really enjoys the program. She was encouraged to reach out to her doctor and support staff with further questions regarding the loop recorder and that more education on a heart healthy lifestyle will be given while in the program.   [place] Name 11/01/20 0620 11/29/20 0727 12/11/20 1609 12/14/20 1208 12/26/20 0823   ITP Comments 30 Day review completed. Medical Director ITP review done, changes made as directed, and signed approval by Medical Director. 30 Day review completed. Medical Director ITP  review done, changes made as directed, and signed approval by Medical Director. Called to check on patient.  She had gotten new job. She did decided to get her ICD.  She missed time for training and lost her new job. She would like to return to rehab.  She plans to return on Wednesday 12/13/20. Ailana has not yet returned and has not attended since last review. Makayia has attended once since last review.   [place] Name 12/27/20 0943           ITP Comments 30 Day review completed. Medical Director ITP review done, changes made as directed, and signed approval by Medical Director.              Comments:

## 2020-12-27 NOTE — Progress Notes (Signed)
Daily Session Note  Patient Details  Name: Stacey Richardson MRN: [id number] Date of Birth: 1965/12/20 Referring Provider:   Flowsheet Row Cardiac Rehab from 09/19/2020 in [facility] Cardiac and Pulmonary Rehab  Referring Provider End      Encounter Date: 12/27/2020  Check In:  Session Check In - 12/27/20 1708      Check-In   Supervising physician immediately available to respond to emergencies See telemetry face sheet for immediately available ER MD    Location [facility]-Cardiac & Pulmonary Rehab    Staff Present Renita Papa, RN BSN;Joseph Lou Miner, [place] Exercise Physiologist;Melissa Tilford Pillar RDN, LDN    Virtual Visit No    Medication changes reported     No    Fall or balance concerns reported    No    Warm-up and Cool-down Performed on first and last piece of equipment    Resistance Training Performed Yes    VAD Patient? No    PAD/SET Patient? No      Pain Assessment   Currently in Pain? No/denies              Social History   Tobacco Use  Smoking Status Former Smoker  Smokeless Tobacco Never Used  Tobacco Comment   Quit 30 years ago    Goals Met:  Independence with exercise equipment Exercise tolerated well No report of cardiac concerns or symptoms Strength training completed today  Goals Unmet:  Not Applicable  Comments: Pt able to follow exercise prescription today without complaint.  Will continue to monitor for progression.    Dr. Emily Filbert is Medical Director for [place] and [facility].

## 2020-12-28 ENCOUNTER — Encounter: Payer: Medicaid Other | Admitting: *Deleted

## 2020-12-28 DIAGNOSIS — Z951 Presence of aortocoronary bypass graft: Secondary | ICD-10-CM

## 2020-12-28 NOTE — Progress Notes (Signed)
Daily Session Note  Patient Details  Name: KRISTYL ATHENS MRN: [id number] Date of Birth: 1966/01/27 Referring Provider:   Flowsheet Row Cardiac Rehab from 09/19/2020 in [facility] Cardiac and Pulmonary Rehab  Referring Provider End      Encounter Date: 12/28/2020  Check In:  Session Check In - 12/28/20 1704      Check-In   Supervising physician immediately available to respond to emergencies See telemetry face sheet for immediately available ER MD    Location [facility]-Cardiac & Pulmonary Rehab    Staff Present Renita Papa, RN BSN;Joseph Lou Miner, [place] Exercise Physiologist    Virtual Visit No    Medication changes reported     No    Fall or balance concerns reported    No    Warm-up and Cool-down Performed on first and last piece of equipment    Resistance Training Performed Yes    VAD Patient? No    PAD/SET Patient? No      Pain Assessment   Currently in Pain? No/denies              Social History   Tobacco Use  Smoking Status Former Smoker  Smokeless Tobacco Never Used  Tobacco Comment   Quit 30 years ago    Goals Met:  Independence with exercise equipment Exercise tolerated well No report of cardiac concerns or symptoms Strength training completed today  Goals Unmet:  Not Applicable  Comments: Pt able to follow exercise prescription today without complaint.  Will continue to monitor for progression.    Dr. Emily Filbert is Medical Director for [place] and [facility].

## 2021-01-01 ENCOUNTER — Encounter: Payer: Medicaid Other | Admitting: *Deleted

## 2021-01-01 ENCOUNTER — Other Ambulatory Visit: Payer: Self-pay

## 2021-01-01 DIAGNOSIS — Z951 Presence of aortocoronary bypass graft: Secondary | ICD-10-CM

## 2021-01-01 NOTE — Progress Notes (Signed)
Daily Session Note  Patient Details  Name: Stacey Richardson MRN: [id number] Date of Birth: June 10, 1966 Referring Provider:   Flowsheet Row Cardiac Rehab from 09/19/2020 in [facility] Cardiac and Pulmonary Rehab  Referring Provider End      Encounter Date: 01/01/2021  Check In:  Session Check In - 01/01/21 1728      Check-In   Supervising physician immediately available to respond to emergencies See telemetry face sheet for immediately available ER MD    Location [facility]-Cardiac & Pulmonary Rehab    Staff Present Renita Papa, RN Moises Blood, BS, ACSM CEP, Exercise Physiologist;Kara Eliezer Bottom, MS Exercise Physiologist    Virtual Visit No    Medication changes reported     No    Fall or balance concerns reported    No    Warm-up and Cool-down Performed on first and last piece of equipment    Resistance Training Performed Yes    VAD Patient? No    PAD/SET Patient? No      Pain Assessment   Currently in Pain? No/denies              Social History   Tobacco Use  Smoking Status Former Smoker  Smokeless Tobacco Never Used  Tobacco Comment   Quit 30 years ago    Goals Met:  Independence with exercise equipment Exercise tolerated well No report of cardiac concerns or symptoms Strength training completed today  Goals Unmet:  Not Applicable  Comments: Pt able to follow exercise prescription today without complaint.  Will continue to monitor for progression.    Dr. Emily Filbert is Medical Director for [place] and [facility].

## 2021-01-04 ENCOUNTER — Other Ambulatory Visit: Payer: Self-pay

## 2021-01-04 ENCOUNTER — Encounter: Payer: Medicaid Other | Admitting: *Deleted

## 2021-01-04 DIAGNOSIS — Z951 Presence of aortocoronary bypass graft: Secondary | ICD-10-CM | POA: Diagnosis not present

## 2021-01-04 NOTE — Progress Notes (Signed)
Daily Session Note  Patient Details  Name: LUCILE HILLMANN MRN: [id number] Date of Birth: 1966/02/07 Referring Provider:   Flowsheet Row Cardiac Rehab from 09/19/2020 in [facility] Cardiac and Pulmonary Rehab  Referring Provider End      Encounter Date: 01/04/2021  Check In:  Session Check In - 01/04/21 1710      Check-In   Supervising physician immediately available to respond to emergencies See telemetry face sheet for immediately available ER MD    Location [facility]-Cardiac & Pulmonary Rehab    Staff Present Renita Papa, RN BSN;Joseph Foy [place], [place], ACSM CEP, Exercise Physiologist    Virtual Visit No    Medication changes reported     No    Fall or balance concerns reported    No    Warm-up and Cool-down Performed on first and last piece of equipment    Resistance Training Performed Yes    VAD Patient? No    PAD/SET Patient? No      Pain Assessment   Currently in Pain? No/denies              Social History   Tobacco Use  Smoking Status Former Smoker  Smokeless Tobacco Never Used  Tobacco Comment   Quit 30 years ago    Goals Met:  Independence with exercise equipment Exercise tolerated well No report of cardiac concerns or symptoms Strength training completed today  Goals Unmet:  Not Applicable  Comments: Pt able to follow exercise prescription today without complaint.  Will continue to monitor for progression. Reviewed home exercise with pt today.  Pt plans to walk and join a gym for exercise.  Reviewed THR, pulse, RPE, sign and symptoms, pulse oximetery and when to call 911 or MD.  Also discussed weather considerations and indoor options.  Pt voiced understanding.    Dr. Emily Filbert is Medical Director for [place] and [facility].

## 2021-01-08 ENCOUNTER — Encounter: Payer: Medicaid Other | Attending: Internal Medicine

## 2021-01-08 DIAGNOSIS — Z951 Presence of aortocoronary bypass graft: Secondary | ICD-10-CM | POA: Insufficient documentation

## 2021-01-10 ENCOUNTER — Ambulatory Visit (INDEPENDENT_AMBULATORY_CARE_PROVIDER_SITE_OTHER): Payer: Medicaid Other | Admitting: Cardiology

## 2021-01-10 ENCOUNTER — Other Ambulatory Visit: Payer: Self-pay

## 2021-01-10 ENCOUNTER — Encounter: Payer: Self-pay | Admitting: Cardiology

## 2021-01-10 VITALS — BP 124/70 | HR 84 | Ht 66.0 in | Wt 188.0 lb

## 2021-01-10 DIAGNOSIS — I5022 Chronic systolic (congestive) heart failure: Secondary | ICD-10-CM | POA: Diagnosis not present

## 2021-01-10 DIAGNOSIS — F064 Anxiety disorder due to known physiological condition: Secondary | ICD-10-CM | POA: Diagnosis not present

## 2021-01-10 DIAGNOSIS — Z95810 Presence of automatic (implantable) cardiac defibrillator: Secondary | ICD-10-CM

## 2021-01-10 DIAGNOSIS — I255 Ischemic cardiomyopathy: Secondary | ICD-10-CM

## 2021-01-10 LAB — PACEMAKER DEVICE OBSERVATION

## 2021-01-10 NOTE — Progress Notes (Signed)
ek 

## 2021-01-10 NOTE — Patient Instructions (Signed)
Medication Instructions:  Your physician recommends that you continue on your current medications as directed. Please refer to the Current Medication list given to you today.  *If you need a refill on your cardiac medications before your next appointment, please call your pharmacy*   Lab Work: None ordered  Testing/Procedures: None ordered   Follow-Up: At [facility], you and your health needs are our priority.  As part of our continuing mission to provide you with exceptional heart care, we have created designated Provider Care Teams.  These Care Teams include your primary Cardiologist (physician) and Advanced Practice Providers (APPs -  Physician Assistants and Nurse Practitioners) who all work together to provide you with the care you need, when you need it.  Remote monitoring is used to monitor your Pacemaker or ICD from home. This monitoring reduces the number of office visits required to check your device to one time per year. It allows [place] to keep an eye on the functioning of your device to ensure it is working properly. You are scheduled for a device check from home on 03/07/2021. You may send your transmission at any time that day. If you have a wireless device, the transmission will be sent automatically. After your physician reviews your transmission, you will receive a postcard with your next transmission date.  Your next appointment:   9 month(s)  The format for your next appointment:   In Person  Provider:   Lars Mage, MD   [facility] to move your follow up with Dr. Saunders Revel out about 6 weeks.   Thank you for choosing [facility]!!

## 2021-01-10 NOTE — Progress Notes (Signed)
Electrophysiology Office Follow up Visit Note:    Date:  01/10/2021   ID:  Stacey Richardson, DOB October 01, 1965, MRN [id number]  PCP:  Theotis Burrow, MD  [facility] Cardiologist:  [name], MD  [facility] Electrophysiologist:  Vickie Epley, MD    Interval History:    Stacey Richardson is a 55 y.o. female who presents for a follow up visit.  She underwent an EP study, SVT ablation (slow pathway modification (and ICD implant on December 05, 2020 at [facility].  She did well during the procedure.  Patient is doing well today.  She is accompanied by her daughter.  She has not had an ICD shock since in the hospital.  No recurrent episodes of SVT since leaving the hospital.    Past Medical History:  Diagnosis Date  . CAD (coronary artery disease)    CABG (07/2020; LIMA-LAD, RIMA-OM1)  . CVA (cerebral vascular accident) ([place])   . Diabetes mellitus   . HFrEF (heart failure with reduced ejection fraction) ([place])   . HLD (hyperlipidemia)   . Hypertension   . SVT (supraventricular tachycardia) ([facility])    s/p ablation (11/2020)    Past Surgical History:  Procedure Laterality Date  . CARDIAC CATHETERIZATION    . CLIPPING OF ATRIAL APPENDAGE N/A 08/06/2020   Procedure: CLIPPING OF ATRIAL APPENDAGE USING ATRICURE CLIP SIZE 40MM;  Surgeon: Wonda Olds, MD;  Location: [place];  Service: Open Heart Surgery;  Laterality: N/A;  . CORONARY ARTERY BYPASS GRAFT N/A 08/06/2020   Procedure: CORONARY ARTERY BYPASS GRAFTING (CABG) X2, USING BILATERAL INTERNAL MAMMARY ARTERIES;  Surgeon: Wonda Olds, MD;  Location: [place];  Service: Open Heart Surgery;  Laterality: N/A;  . ICD IMPLANT N/A 12/05/2020   Procedure: ICD IMPLANT;  Surgeon: Vickie Epley, MD;  Location: [place] [facility];  Service: Cardiovascular;  Laterality: N/A;  . KNEE SURGERY    . RIGHT/LEFT HEART CATH AND CORONARY ANGIOGRAPHY Bilateral 08/02/2020   Procedure: RIGHT/LEFT HEART CATH AND CORONARY ANGIOGRAPHY;   Surgeon: [name], MD;  Location: [place] [facility];  Service: Cardiovascular;  Laterality: Bilateral;  . SVT ABLATION N/A 12/05/2020   Procedure: SVT ABLATION;  Surgeon: Vickie Epley, MD;  Location: [place] [facility];  Service: Cardiovascular;  Laterality: N/A;  . TEE WITHOUT CARDIOVERSION N/A 07/18/2020   Procedure: TRANSESOPHAGEAL ECHOCARDIOGRAM (TEE);  Surgeon: [name], MD;  Location: [facility];  Service: Cardiovascular;  Laterality: N/A;  . TEE WITHOUT CARDIOVERSION N/A 08/06/2020   Procedure: TRANSESOPHAGEAL ECHOCARDIOGRAM (TEE);  Surgeon: Wonda Olds, MD;  Location: [place];  Service: Open Heart Surgery;  Laterality: N/A;    Current Medications: Current Meds  Medication Sig  . acetaminophen (TYLENOL) 500 MG tablet Take 500 mg by mouth every 6 (six) hours as needed for moderate pain or headache.  Marland Kitchen alum & mag hydroxide-simeth (MAALOX ADVANCED MAX ST) 400-400-40 MG/5ML suspension Take 15 mLs by mouth every 6 (six) hours as needed for indigestion.  Marland Kitchen aspirin EC 81 MG tablet Take 81 mg by mouth daily.   Marland Kitchen atorvastatin (LIPITOR) 80 MG tablet Take 80 mg by mouth at bedtime.  Marland Kitchen losartan (COZAAR) 25 MG tablet TAKE 1 TABLET (25 MG TOTAL) BY MOUTH DAILY.  . metFORMIN (GLUCOPHAGE-XR) 500 MG 24 hr tablet Take 500 mg by mouth daily with breakfast.  . spironolactone (ALDACTONE) 25 MG tablet Take 0.5 tablets (12.5 mg total) by mouth daily.  Marland Kitchen tetrahydrozoline 0.05 % ophthalmic solution Place 1-2 drops  into both eyes daily as needed (irritated eyes.). visine     Allergies:   Peanut butter flavor and Lisinopril   Social History   Socioeconomic History  . Marital status: Divorced    Spouse name: Not on file  . Number of children: Not on file  . Years of education: Not on file  . Highest education level: Not on file  Occupational History  . Occupation: on short term disability  Tobacco Use  . Smoking status: Former Research scientist (life sciences)  . Smokeless tobacco: Never Used  .  Tobacco comment: Quit 30 years ago  Vaping Use  . Vaping Use: Never used  Substance and Sexual Activity  . Alcohol use: Yes    Comment: social  . Drug use: No  . Sexual activity: Not on file  Other Topics Concern  . Not on file  Social History Narrative   Lives with boyfriend   Right Handed   Drinks no caffeine daily   Social Determinants of Health   Financial Resource Strain: Not on file  Food Insecurity: Not on file  Transportation Needs: Not on file  Physical Activity: Not on file  Stress: Not on file  Social Connections: Not on file     Family History: The patient's family history includes Heart disease in her mother; Heart failure in her brother; Hypertension in her brother.  ROS:   Please see the history of present illness.    All other systems reviewed and are negative.  EKGs/Labs/Other Studies Reviewed:    The following studies were reviewed today:  Jan 10, 2021 device interrogation in clinic personally reviewed 0% pacing Lead parameters are stable Histograms are appropriate No tacky episodes  EKG:  The ekg ordered today demonstrates sinus rhythm  Recent Labs: 06/12/2020: TSH 1.251 07/20/2020: B Natriuretic Peptide 26.5 10/08/2020: ALT 15 12/01/2020: BUN 12; Creatinine, Ser 0.56; Magnesium 1.5; Potassium 3.9; Sodium 135 12/04/2020: Hemoglobin 9.7; Platelets 363  Recent Lipid Panel    Component Value Date/Time   CHOL 113 12/11/2020 1133   TRIG 60 12/11/2020 1133   HDL 41 12/11/2020 1133   CHOLHDL 2.8 12/11/2020 1133   CHOLHDL 3.3 08/03/2020 0042   VLDL 12 08/03/2020 0042   LDLCALC 59 12/11/2020 1133    Physical Exam:    VS:  BP 124/70   Pulse 84   Ht 5\' 6"  (1.676 m)   Wt 188 lb (85.3 kg)   LMP 11/21/2020   BMI 30.34 kg/m     Wt Readings from Last 3 Encounters:  01/10/21 188 lb (85.3 kg)  12/11/20 190 lb (86.2 kg)  12/09/20 187 lb (84.8 kg)     GEN:  Well nourished, well developed in no acute distress.  Obese HEENT: Normal NECK: No JVD;  No carotid bruits LYMPHATICS: No lymphadenopathy CARDIAC: RRR, no murmurs, rubs, gallops.  ICD pocket well-healed. RESPIRATORY:  Clear to auscultation without rales, wheezing or rhonchi  ABDOMEN: Soft, non-tender, non-distended MUSCULOSKELETAL:  No edema; No deformity  SKIN: Warm and dry NEUROLOGIC:  Alert and oriented x 3 PSYCHIATRIC:  Normal affect   ASSESSMENT:    1. Chronic HFrEF (heart failure with reduced ejection fraction) ([place])   2. Ischemic cardiomyopathy   3. Anxiety disorder due to medical condition   4. ICD (implantable cardioverter-defibrillator) in place    PLAN:    In order of problems listed above:  1. Chronic combined systolic and diastolic heart failure secondary to ischemic cardiomyopathy and coronary artery disease post CABG NYHA class II.  Warm and dry.  No ischemic symptoms. Continue current medications including aspirin, atorvastatin, Coreg, losartan, spironolactone Follow-up with Dr. Saunders Revel.  We will push back this appointment a few weeks since I am seeing her today Patient with a lot of concerns about her cardiomyopathy and living with the diagnosis.  She is concerned about receiving a shock.  I have referred her to Dr. Michail Sermon and she has an appointment with him later this month.  I have encouraged her to keep this appointment.   2.  ICD in situ Device functioning well.  Plan to see her back in clinic in 9 months.  Continue remote monitoring.  Follow-up 9 months      Medication Adjustments/Labs and Tests Ordered: Current medicines are reviewed at length with the patient today.  Concerns regarding medicines are outlined above.  Orders Placed This Encounter  Procedures  . EKG 12-Lead   No orders of the defined types were placed in this encounter.    Signed, Lars Mage, MD, [facility], [facility] 01/10/2021 2:05 PM    Electrophysiology [place]

## 2021-01-12 ENCOUNTER — Ambulatory Visit: Payer: 59 | Admitting: Internal Medicine

## 2021-01-15 ENCOUNTER — Other Ambulatory Visit: Payer: Self-pay

## 2021-01-15 ENCOUNTER — Encounter: Payer: Medicaid Other | Admitting: *Deleted

## 2021-01-15 DIAGNOSIS — Z951 Presence of aortocoronary bypass graft: Secondary | ICD-10-CM

## 2021-01-15 NOTE — Progress Notes (Signed)
Daily Session Note  Patient Details  Name: Stacey Richardson MRN: [id number] Date of Birth: Nov 10, 1965 Referring Provider:   Flowsheet Row Cardiac Rehab from 09/19/2020 in [facility]  Referring Provider End      Encounter Date: 01/15/2021  Check In:  Session Check In - 01/15/21 1718      Check-In   Supervising physician immediately available to respond to emergencies See telemetry face sheet for immediately available ER MD    Location [facility]-Cardiac & Pulmonary Rehab    Staff Present Renita Papa, RN Margurite Auerbach, MS Exercise Physiologist;Kelly Amedeo Plenty, BS, ACSM CEP, Exercise Physiologist    Virtual Visit No    Medication changes reported     No    Fall or balance concerns reported    No    Warm-up and Cool-down Performed on first and last piece of equipment    Resistance Training Performed Yes    VAD Patient? No    PAD/SET Patient? No      Pain Assessment   Currently in Pain? No/denies              Social History   Tobacco Use  Smoking Status Former Smoker  Smokeless Tobacco Never Used  Tobacco Comment   Quit 30 years ago    Goals Met:  Independence with exercise equipment Exercise tolerated well No report of cardiac concerns or symptoms Strength training completed today  Goals Unmet:  Not Applicable  Comments: Pt able to follow exercise prescription today without complaint.  Will continue to monitor for progression.    Dr. Emily Filbert is Medical Director for [place] and [facility].

## 2021-01-15 NOTE — Progress Notes (Signed)
Called Stacey Richardson as she has not been here since 4/28. Stacey Richardson reports she is still nervous about her lightheadedness, but reports that she has spoken to her doctor before about this and he said it was her medications. Encouraged her to call if she could not show up due to excess dizziness - unable to drive safely. Discussed virtual combined program option or discharging early. She is not ready for either option and would like to try coming in more consistently. Encouraged her to call her doctor regarding this is she is still concerned.

## 2021-01-17 ENCOUNTER — Other Ambulatory Visit: Payer: Self-pay

## 2021-01-17 DIAGNOSIS — Z951 Presence of aortocoronary bypass graft: Secondary | ICD-10-CM | POA: Diagnosis not present

## 2021-01-17 NOTE — Progress Notes (Signed)
Daily Session Note  Patient Details  Name: Stacey Richardson MRN: [id number] Date of Birth: 10/09/1965 Referring Provider:   Flowsheet Row Cardiac Rehab from 09/19/2020 in [facility] Card[facility] and [facility] Re[facility]  Referring Provider End      Encounter Date: 01/17/2021  Check In:  Session Check In - 01/17/21 1727      Check-In   Supervising physician immediately available to respond to emergencies See telemetry face sheet for immediately available ER MD    Location [facility]    Staff Present Kelly Bollinger, MPA, RN;Joseph Hood RCP,RRT,BSRT;Meredith Craven, RN BSN;Kara Langdon, MS Exercise Physiologist    Virtual Visit No    Medication changes reported     No    Fall or balance concerns reported    No    Warm-up and Cool-down Performed on first and last piece of equipment    Resistance Training Performed Yes    VAD Patient? No    PAD/SET Patient? No      Pain Assessment   Currently in Pain? No/denies              Social History   Tobacco Use  Smoking Status Former Smoker  Smokeless Tobacco Never Used  Tobacco Comment   Quit 30 years ago    Goals Met:  Independence with exercise equipment Exercise tolerated well No report of cardiac concerns or symptoms Strength training completed today  Goals Unmet:  Not Applicable  Comments: Pt able to follow exercise prescription today without complaint.  Will continue to monitor for progression.    Dr. Mark Miller is Medical Director for [facility] and [facility]. 

## 2021-01-18 ENCOUNTER — Encounter: Payer: Medicaid Other | Admitting: *Deleted

## 2021-01-18 DIAGNOSIS — Z951 Presence of aortocoronary bypass graft: Secondary | ICD-10-CM

## 2021-01-18 NOTE — Progress Notes (Signed)
Daily Session Note  Patient Details  Name: Stacey Richardson MRN: [id number] Date of Birth: 11/27/1965 Referring Provider:   Flowsheet Row Cardiac Rehab from 09/19/2020 in [facility]  Referring Provider End      Encounter Date: 01/18/2021  Check In:  Session Check In - 01/18/21 1755      Check-In   Supervising physician immediately available to respond to emergencies See telemetry face sheet for immediately available ER MD    Location [facility] Re[facility]    Staff Present Amanda Sommer, BA, ACSM CEP, Exercise Physiologist;Joseph Hood RCP,RRT,BSRT;Susanne Bice, RN, BSN, CCRP    Virtual Visit No    Medication changes reported     No    Fall or balance concerns reported    No    Warm-up and Cool-down Performed on first and last piece of equipment    Resistance Training Performed Yes    VAD Patient? No    PAD/SET Patient? No      Pain Assessment   Currently in Pain? No/denies              Social History   Tobacco Use  Smoking Status Former Smoker  Smokeless Tobacco Never Used  Tobacco Comment   Quit 30 years ago    Goals Met:  Independence with exercise equipment Exercise tolerated well No report of cardiac concerns or symptoms  Goals Unmet:  Not Applicable  Comments: Pt able to follow exercise prescription today without complaint.  Will continue to monitor for progression.    Dr. Mark Miller is Medical Director for [facility] and [facility]. 

## 2021-01-21 ENCOUNTER — Other Ambulatory Visit: Payer: Self-pay

## 2021-01-21 ENCOUNTER — Emergency Department (HOSPITAL_COMMUNITY)
Admission: EM | Admit: 2021-01-21 | Discharge: 2021-01-21 | Disposition: A | Discharge status: Home / Self Care | Payer: Medicaid Other | Attending: Emergency Medicine | Admitting: Emergency Medicine

## 2021-01-21 ENCOUNTER — Encounter (HOSPITAL_COMMUNITY): Payer: Self-pay

## 2021-01-21 DIAGNOSIS — E1165 Type 2 diabetes mellitus with hyperglycemia: Secondary | ICD-10-CM | POA: Insufficient documentation

## 2021-01-21 DIAGNOSIS — R42 Dizziness and giddiness: Secondary | ICD-10-CM | POA: Insufficient documentation

## 2021-01-21 DIAGNOSIS — I509 Heart failure, unspecified: Secondary | ICD-10-CM | POA: Insufficient documentation

## 2021-01-21 DIAGNOSIS — Z87891 Personal history of nicotine dependence: Secondary | ICD-10-CM | POA: Diagnosis not present

## 2021-01-21 DIAGNOSIS — Z7984 Long term (current) use of oral hypoglycemic drugs: Secondary | ICD-10-CM | POA: Insufficient documentation

## 2021-01-21 DIAGNOSIS — Z951 Presence of aortocoronary bypass graft: Secondary | ICD-10-CM | POA: Diagnosis not present

## 2021-01-21 DIAGNOSIS — R739 Hyperglycemia, unspecified: Secondary | ICD-10-CM | POA: Diagnosis present

## 2021-01-21 DIAGNOSIS — Z79899 Other long term (current) drug therapy: Secondary | ICD-10-CM | POA: Insufficient documentation

## 2021-01-21 DIAGNOSIS — I110 Hypertensive heart disease with heart failure: Secondary | ICD-10-CM | POA: Diagnosis not present

## 2021-01-21 DIAGNOSIS — Z91010 Allergy to peanuts: Secondary | ICD-10-CM | POA: Diagnosis not present

## 2021-01-21 DIAGNOSIS — Z8673 Personal history of transient ischemic attack (TIA), and cerebral infarction without residual deficits: Secondary | ICD-10-CM | POA: Diagnosis not present

## 2021-01-21 DIAGNOSIS — Z7982 Long term (current) use of aspirin: Secondary | ICD-10-CM | POA: Diagnosis not present

## 2021-01-21 DIAGNOSIS — I2510 Atherosclerotic heart disease of native coronary artery without angina pectoris: Secondary | ICD-10-CM | POA: Insufficient documentation

## 2021-01-21 LAB — CBC WITH DIFFERENTIAL/PLATELET
Abs Immature Granulocytes: 0.02 10*3/uL (ref 0.00–0.07)
Basophils Absolute: 0.1 10*3/uL (ref 0.0–0.1)
Basophils Relative: 1 %
Eosinophils Absolute: 0.3 10*3/uL (ref 0.0–0.5)
Eosinophils Relative: 4 %
HCT: 35.8 % — ABNORMAL LOW (ref 36.0–46.0)
Hemoglobin: 10.6 g/dL — ABNORMAL LOW (ref 12.0–15.0)
Immature Granulocytes: 0 %
Lymphocytes Relative: 31 %
Lymphs Abs: 2.2 10*3/uL (ref 0.7–4.0)
MCH: 23 pg — ABNORMAL LOW (ref 26.0–34.0)
MCHC: 29.6 g/dL — ABNORMAL LOW (ref 30.0–36.0)
MCV: 77.8 fL — ABNORMAL LOW (ref 80.0–100.0)
Monocytes Absolute: 0.5 10*3/uL (ref 0.1–1.0)
Monocytes Relative: 7 %
Neutro Abs: 4 10*3/uL (ref 1.7–7.7)
Neutrophils Relative %: 57 %
Platelets: 294 10*3/uL (ref 150–400)
RBC: 4.6 MIL/uL (ref 3.87–5.11)
RDW: 16.8 % — ABNORMAL HIGH (ref 11.5–15.5)
WBC: 7.1 10*3/uL (ref 4.0–10.5)
nRBC: 0 % (ref 0.0–0.2)

## 2021-01-21 LAB — BASIC METABOLIC PANEL
Anion gap: 6 (ref 5–15)
BUN: 16 mg/dL (ref 6–20)
CO2: 26 mmol/L (ref 22–32)
Calcium: 9.1 mg/dL (ref 8.9–10.3)
Chloride: 102 mmol/L (ref 98–111)
Creatinine, Ser: 0.7 mg/dL (ref 0.44–1.00)
GFR, Estimated: >60 mL/min (ref >60)
Glucose, Bld: 240 mg/dL — ABNORMAL HIGH (ref 70–99)
Potassium: 3.6 mmol/L (ref 3.5–5.1)
Sodium: 134 mmol/L — ABNORMAL LOW (ref 135–145)

## 2021-01-21 LAB — CBG MONITORING, ED
Glucose-Capillary: 295 mg/dL — ABNORMAL HIGH (ref 70–99)
Glucose-Capillary: 135 mg/dL — ABNORMAL HIGH (ref 70–99)

## 2021-01-21 LAB — TROPONIN I (HIGH SENSITIVITY)
Troponin I (High Sensitivity): <2 ng/L (ref <18)
Troponin I (High Sensitivity): <2 ng/L (ref <18)

## 2021-01-21 MED ORDER — SODIUM CHLORIDE 0.9 % IV BOLUS
500.0000 mL | Freq: Once | INTRAVENOUS | Status: AC
  Administered 2021-01-21: 500 mL via INTRAVENOUS

## 2021-01-21 NOTE — ED Provider Notes (Signed)
[facility] EMERGENCY DEPARTMENT Provider Note   CSN: [id number] Arrival date & time: 01/21/21  0026     History Chief Complaint  Patient presents with  . Hyperglycemia    Farren DEB LOUDIN is a 55 y.o. female.  Patient with history of CAD status post CABG, diabetes, heart failure with reduced ejection fraction with AICD presenting with hyperglycemia.  States she checked her sugar at 10 PM and was 342.  She takes metformin but no insulin.  Her sugar normally runs in the 1 and 200 range after eating.  Believes she might of eaten too much sugar tonight.  She became concerned given her heart history wanted to get checked out.  She has ongoing lightheadedness which is been ongoing for quite some time and is unchanged today.  No chest pain or shortness of breath.  No room spinning dizziness.  No focal weakness, numbness or tingling.  No pain with urination or blood in the urine.  No abdominal pain. States compliance with her metformin and other medications.  Denies any room spinning dizziness or difficulty breathing.  No chest pain  The history is provided by the patient.  Hyperglycemia Associated symptoms: dizziness   Associated symptoms: no abdominal pain, no chest pain, no dysuria, no fever, no nausea, no shortness of breath, no vomiting and no weakness        Past Medical History:  Diagnosis Date  . CAD (coronary artery disease)    CABG (07/2020; LIMA-LAD, RIMA-OM1)  . CVA (cerebral vascular accident) ([place])   . Diabetes mellitus   . HFrEF (heart failure with reduced ejection fraction) ([place])   . HLD (hyperlipidemia)   . Hypertension   . SVT (supraventricular tachycardia) ([facility])    s/p ablation (11/2020)    Patient Active Problem List   Diagnosis Date Noted  . Ischemic cardiomyopathy 10/11/2020  . Hyperlipidemia associated with type 2 diabetes mellitus ([place]) 10/11/2020  . CAD in native artery 08/02/2020  . Chronic HFrEF (heart failure with reduced ejection fraction) ([place])  07/26/2020  . Lightheadedness 07/26/2020  . HFrEF (heart failure with reduced ejection fraction) ([place]) 06/20/2020  . Cardiomyopathy ([place]) 06/20/2020  . Hyperlipidemia LDL goal <70 06/20/2020  . Hyperglycemia   . Cryptogenic stroke ([place])   . TIA (transient ischemic attack) 06/12/2020  . Multifocal pneumonia 01/22/2019  . Type 2 diabetes mellitus without complication ([place])   . Essential hypertension     Past Surgical History:  Procedure Laterality Date  . CARDIAC CATHETERIZATION    . CLIPPING OF ATRIAL APPENDAGE N/A 08/06/2020   Procedure: CLIPPING OF ATRIAL APPENDAGE USING ATRICURE CLIP SIZE 40MM;  Surgeon: Wonda Olds, MD;  Location: [place];  Service: Open Heart Surgery;  Laterality: N/A;  . CORONARY ARTERY BYPASS GRAFT N/A 08/06/2020   Procedure: CORONARY ARTERY BYPASS GRAFTING (CABG) X2, USING BILATERAL INTERNAL MAMMARY ARTERIES;  Surgeon: Wonda Olds, MD;  Location: [place];  Service: Open Heart Surgery;  Laterality: N/A;  . ICD IMPLANT N/A 12/05/2020   Procedure: ICD IMPLANT;  Surgeon: Vickie Epley, MD;  Location: [place] [facility];  Service: Cardiovascular;  Laterality: N/A;  . KNEE SURGERY    . RIGHT/LEFT HEART CATH AND CORONARY ANGIOGRAPHY Bilateral 08/02/2020   Procedure: RIGHT/LEFT HEART CATH AND CORONARY ANGIOGRAPHY;  Surgeon: [name], MD;  Location: [place] [facility];  Service: Cardiovascular;  Laterality: Bilateral;  . SVT ABLATION N/A 12/05/2020   Procedure: SVT ABLATION;  Surgeon: Vickie Epley, MD;  Location: [place] [facility];  Service: Cardiovascular;  Laterality: N/A;  . TEE WITHOUT CARDIOVERSION N/A 07/18/2020   Procedure: TRANSESOPHAGEAL ECHOCARDIOGRAM (TEE);  Surgeon: [name], MD;  Location: [facility];  Service: Cardiovascular;  Laterality: N/A;  . TEE WITHOUT CARDIOVERSION N/A 08/06/2020   Procedure: TRANSESOPHAGEAL ECHOCARDIOGRAM (TEE);  Surgeon: Wonda Olds, MD;  Location: [place];  Service: Open Heart Surgery;   Laterality: N/A;     OB History   No obstetric history on file.     Family History  Problem Relation Age of Onset  . Heart disease Mother        a. ?valve  . Heart failure Brother   . Hypertension Brother     Social History   Tobacco Use  . Smoking status: Former Research scientist (life sciences)  . Smokeless tobacco: Never Used  . Tobacco comment: Quit 30 years ago  Vaping Use  . Vaping Use: Never used  Substance Use Topics  . Alcohol use: Yes    Comment: social  . Drug use: No    Home Medications Prior to Admission medications   Medication Sig Start Date End Date Taking? Authorizing Provider  acetaminophen (TYLENOL) 500 MG tablet Take 500 mg by mouth every 6 (six) hours as needed for moderate pain or headache.    [provider]  alum & mag hydroxide-simeth (MAALOX ADVANCED MAX ST) 409-811-91 MG/5ML suspension Take 15 mLs by mouth every 6 (six) hours as needed for indigestion. 12/09/20   Mesner, Corene Cornea, MD  aspirin EC 81 MG tablet Take 81 mg by mouth daily.     [provider]  atorvastatin (LIPITOR) 80 MG tablet Take 80 mg by mouth at bedtime. 07/14/20   [provider]  carvedilol (COREG) 12.5 MG tablet Take 1 tablet (12.5 mg total) by mouth 2 (two) times daily. 10/11/20 01/09/21  End, Harrell Gave, MD  losartan (COZAAR) 25 MG tablet TAKE 1 TABLET (25 MG TOTAL) BY MOUTH DAILY. 12/11/20 03/11/21  End, Harrell Gave, MD  metFORMIN (GLUCOPHAGE-XR) 500 MG 24 hr tablet Take 500 mg by mouth daily with breakfast. 06/08/20   [provider]  spironolactone (ALDACTONE) 25 MG tablet Take 0.5 tablets (12.5 mg total) by mouth daily. 11/13/20 05/12/21  Loel Dubonnet, NP  tetrahydrozoline 0.05 % ophthalmic solution Place 1-2 drops into both eyes daily as needed (irritated eyes.). visine    [provider]    Allergies    Peanut butter flavor and Lisinopril  Review of Systems   Review of Systems  Constitutional: Negative for activity change, appetite change and fever.  HENT:  Negative for congestion and rhinorrhea.   Respiratory: Negative for chest tightness and shortness of breath.   Cardiovascular: Negative for chest pain and leg swelling.  Gastrointestinal: Negative for abdominal pain, nausea and vomiting.  Genitourinary: Negative for dysuria and hematuria.  Musculoskeletal: Negative for arthralgias and myalgias.  Skin: Negative for rash.  Neurological: Positive for dizziness and light-headedness. Negative for weakness.   all other systems are negative except as noted in the HPI and PMH.    Physical Exam Updated Vital Signs BP (!) 124/58 (BP Location: Right Arm)   Pulse 80   Temp 97.6 F (36.4 C) (Oral)   Resp 18   Ht 5\' 6"  (1.676 m)   Wt 84.8 kg   LMP 11/21/2020   SpO2 100%   BMI 30.18 kg/m   Physical Exam Vitals and nursing note reviewed.  Constitutional:      General: She is not in acute distress.    Appearance: She is  well-developed.  HENT:     Head: Normocephalic and atraumatic.     Mouth/Throat:     Pharynx: No oropharyngeal exudate.  Eyes:     Conjunctiva/sclera: Conjunctivae normal.     Pupils: Pupils are equal, round, and reactive to light.  Neck:     Comments: No meningismus. Cardiovascular:     Rate and Rhythm: Normal rate and regular rhythm.     Heart sounds: Normal heart sounds. No murmur heard.   Pulmonary:     Effort: Pulmonary effort is normal. No respiratory distress.     Breath sounds: Normal breath sounds.     Comments: AICD in place Abdominal:     Palpations: Abdomen is soft.     Tenderness: There is no abdominal tenderness. There is no guarding or rebound.  Musculoskeletal:        General: No tenderness. Normal range of motion.     Cervical back: Normal range of motion and neck supple.  Skin:    General: Skin is warm.     Findings: No rash.  Neurological:     Mental Status: She is alert and oriented to person, place, and time.     Cranial Nerves: No cranial nerve deficit.     Motor: No abnormal muscle  tone.     Coordination: Coordination normal.     Comments: CN 2-12 intact, no ataxia on finger to nose, no nystagmus, 5/5 strength throughout, no pronator drift, Romberg negative, normal gait.   Psychiatric:        Behavior: Behavior normal.     ED Results / Procedures / Treatments   Labs (all labs ordered are listed, but only abnormal results are displayed) Labs Reviewed  CBC WITH DIFFERENTIAL/PLATELET - Abnormal; Notable for the following components:      Result Value   Hemoglobin 10.6 (*)    HCT 35.8 (*)    MCV 77.8 (*)    MCH 23.0 (*)    MCHC 29.6 (*)    RDW 16.8 (*)    All other components within normal limits  BASIC METABOLIC PANEL - Abnormal; Notable for the following components:   Sodium 134 (*)    Glucose, Bld 240 (*)    All other components within normal limits  CBG MONITORING, ED - Abnormal; Notable for the following components:   Glucose-Capillary 295 (*)    All other components within normal limits  CBG MONITORING, ED - Abnormal; Notable for the following components:   Glucose-Capillary 135 (*)    All other components within normal limits  URINALYSIS, ROUTINE W REFLEX MICROSCOPIC  TROPONIN I (HIGH SENSITIVITY)  TROPONIN I (HIGH SENSITIVITY)    EKG EKG Interpretation  Date/Time:  Sunday Jan 21 2021 04:56:06 EDT Ventricular Rate:  69 PR Interval:  176 QRS Duration: 94 QT Interval:  552 QTC Calculation: 592 R Axis:   4 Text Interpretation: Sinus rhythm Low voltage, precordial leads Borderline abnrm T, anterolateral leads QT improved No significant change was found Confirmed by Ezequiel Essex [phone]) on 01/21/2021 5:02:02 AM   Radiology No results found.  Procedures Procedures   Medications Ordered in ED Medications  sodium chloride 0.9 % bolus 500 mL (has no administration in time range)    ED Course  I have reviewed the triage vital signs and the nursing notes.  Pertinent labs & imaging results that were available during my care of the  patient were reviewed by me and considered in my medical decision making (see chart for details).  MDM Rules/Calculators/A&P                         Hyperglycemia, rule out DKA.  Hyperglycemia without evidence of DKA.  Normal anion gap.  EKG is sinus rhythm with no acute ST changes.  Questionably prolonged QT is not present by manual calculation.  Troponin negative x2.  Orthostatics are negative.  Patient tolerating p.o. and ambulatory.  Advised to keep a record of her blood sugar at home and follow-up with her doctor for adjustments of her medications.  Return to the ED with exertional chest pain, shortness of breath, nausea, vomiting, sweating, any other concerns Final Clinical Impression(s) / ED Diagnoses Final diagnoses:  Hyperglycemia    Rx / DC Orders ED Discharge Orders    None       Reino Lybbert, Annie Main, MD 01/21/21 [id number]

## 2021-01-21 NOTE — ED Triage Notes (Signed)
Pt states that she checked her sugar at 10pm and it was 342. Pt complains of being lightheaded. Pt wants to get checked out because she is worried it is too high.

## 2021-01-21 NOTE — Discharge Instructions (Signed)
Keep a record of your blood sugars and follow-up with your doctor for medication adjustments.  Return to the ED with exertional chest pain, shortness of breath, nausea, vomiting, sweating, any other concerns.

## 2021-01-24 ENCOUNTER — Other Ambulatory Visit: Payer: Self-pay

## 2021-01-24 ENCOUNTER — Encounter: Payer: Self-pay | Admitting: *Deleted

## 2021-01-24 DIAGNOSIS — Z951 Presence of aortocoronary bypass graft: Secondary | ICD-10-CM

## 2021-01-24 NOTE — Progress Notes (Signed)
Cardiac Individual Treatment Plan  Patient Details  Name: Stacey Richardson MRN: [id number] Date of Birth: 1965-10-10 Referring Provider:   Flowsheet Row Cardiac Rehab from 09/19/2020 in [facility] Cardiac and Pulmonary Rehab  Referring Provider End      Initial Encounter Date:  Flowsheet Row Cardiac Rehab from 09/19/2020 in [facility] Cardiac and Pulmonary Rehab  Date 09/19/20      Visit Diagnosis: S/P CABG x 2  Patient's Home Medications on Admission:  Current Outpatient Medications:  .  acetaminophen (TYLENOL) 500 MG tablet, Take 500 mg by mouth every 6 (six) hours as needed for moderate pain or headache., Disp: , Rfl:  .  alum & mag hydroxide-simeth (MAALOX ADVANCED MAX ST) 400-400-40 MG/5ML suspension, Take 15 mLs by mouth every 6 (six) hours as needed for indigestion., Disp: 355 mL, Rfl: 0 .  aspirin EC 81 MG tablet, Take 81 mg by mouth daily. , Disp: , Rfl:  .  atorvastatin (LIPITOR) 80 MG tablet, Take 80 mg by mouth at bedtime., Disp: , Rfl:  .  carvedilol (COREG) 12.5 MG tablet, Take 1 tablet (12.5 mg total) by mouth 2 (two) times daily., Disp: 60 tablet, Rfl: 5 .  losartan (COZAAR) 25 MG tablet, TAKE 1 TABLET (25 MG TOTAL) BY MOUTH DAILY., Disp: 30 tablet, Rfl: 2 .  metFORMIN (GLUCOPHAGE-XR) 500 MG 24 hr tablet, Take 500 mg by mouth daily with breakfast., Disp: , Rfl:  .  spironolactone (ALDACTONE) 25 MG tablet, Take 0.5 tablets (12.5 mg total) by mouth daily., Disp: 45 tablet, Rfl: 1 .  tetrahydrozoline 0.05 % ophthalmic solution, Place 1-2 drops into both eyes daily as needed (irritated eyes.). visine, Disp: , Rfl:   Past Medical History: Past Medical History:  Diagnosis Date  . CAD (coronary artery disease)    CABG (07/2020; LIMA-LAD, RIMA-OM1)  . CVA (cerebral vascular accident) ([place])   . Diabetes mellitus   . HFrEF (heart failure with reduced ejection fraction) ([place])   . HLD (hyperlipidemia)   . Hypertension   . SVT (supraventricular tachycardia) ([facility])    s/p ablation (11/2020)     Tobacco Use: Social History   Tobacco Use  Smoking Status Former Smoker  Smokeless Tobacco Never Used  Tobacco Comment   Quit 30 years ago    Labs: Recent Chemical engineer    Labs for ITP Cardiac and Pulmonary Rehab Latest Ref Rng & Units 08/06/2020 08/06/2020 08/07/2020 08/08/2020 12/11/2020   Cholestrol 100 - 199 mg/dL - - - - 113   LDLCALC 0 - 99 mg/dL - - - - 59   HDL >39 mg/dL - - - - 41   Trlycerides 0 - 149 mg/dL - - - - 60   Hemoglobin A1c 4.8 - 5.6 % - - - - 6.5(H)   PHART 7.350 - 7.450 7.328(L) 7.321(L) - - -   PCO2ART 32.0 - 48.0 mmHg 37.0 38.5 - - -   HCO3 20.0 - 28.0 mmol/L 19.4(L) 19.9(L) - - -   TCO2 22 - 32 mmol/L 20(L) 21(L) - - -   ACIDBASEDEF 0.0 - 2.0 mmol/L 6.0(H) 6.0(H) - - -   O2SAT % 99.0 99.0 74.6 62.1 -       Exercise Target Goals: Exercise Program Goal: Individual exercise prescription set using results from initial 6 min walk test and THRR while considering  patient's activity barriers and safety.   Exercise Prescription Goal: Initial exercise prescription builds to 30-45 minutes a day of aerobic activity, 2-3 days per week.  Home exercise  guidelines will be given to patient during program as part of exercise prescription that the participant will acknowledge.   Education: Aerobic Exercise: - Group verbal and visual presentation on the components of exercise prescription. Introduces F.I.T.T principle from ACSM for exercise prescriptions.  Reviews F.I.T.T. principles of aerobic exercise including progression. Written material given at graduation.   Education: Resistance Exercise: - Group verbal and visual presentation on the components of exercise prescription. Introduces F.I.T.T principle from ACSM for exercise prescriptions  Reviews F.I.T.T. principles of resistance exercise including progression. Written material given at graduation.    Education: Exercise & Equipment Safety: - Individual verbal instruction and demonstration of  equipment use and safety with use of the equipment. Flowsheet Row Cardiac Rehab from 11/15/2020 in [facility] Cardiac and Pulmonary Rehab  Date 09/19/20  Educator AS  Instruction Review Code 1- Verbalizes Understanding      Education: Exercise Physiology & General Exercise Guidelines: - Group verbal and written instruction with models to review the exercise physiology of the cardiovascular system and associated critical values. Provides general exercise guidelines with specific guidelines to those with heart or lung disease.    Education: Flexibility, Balance, Mind/Body Relaxation: - Group verbal and visual presentation with interactive activity on the components of exercise prescription. Introduces F.I.T.T principle from ACSM for exercise prescriptions. Reviews F.I.T.T. principles of flexibility and balance exercise training including progression. Also discusses the mind body connection.  Reviews various relaxation techniques to help reduce and manage stress (i.e. Deep breathing, progressive muscle relaxation, and visualization). Balance handout provided to take home. Written material given at graduation. Flowsheet Row Cardiac Rehab from 11/15/2020 in [facility] Cardiac and Pulmonary Rehab  Date 11/08/20  Educator AS  Instruction Review Code 1- Verbalizes Understanding      Activity Barriers & Risk Stratification:  Activity Barriers & Cardiac Risk Stratification - 09/11/20 1142      Activity Barriers & Cardiac Risk Stratification   Activity Barriers None    Cardiac Risk Stratification Moderate           6 Minute Walk:  6 Minute Walk    Row Name 09/19/20 1453         6 Minute Walk   Phase Initial     Distance 1365 feet     Walk Time 6 minutes     # of Rest Breaks 0     MPH 2.58     METS 4.5     RPE 11     Perceived Dyspnea  2     VO2 Peak 15.77     Symptoms No     Resting HR 91 bpm     Resting BP 110/66     Resting Oxygen Saturation  99 %     Exercise Oxygen Saturation  during 6  min walk 98 %     Max Ex. HR 141 bpm     Max Ex. BP 154/82     2 Minute Post BP 116/68            Oxygen Initial Assessment:   Oxygen Re-Evaluation:   Oxygen Discharge (Final Oxygen Re-Evaluation):   Initial Exercise Prescription:  Initial Exercise Prescription - 09/19/20 1400      Date of Initial Exercise RX and Referring Provider   Date 09/19/20    Referring Provider End      Treadmill   MPH 2.6    Grade 2.5    Minutes 15    METs 3.9      Recumbant Bike  Level 2    RPM 60    Watts 50    Minutes 15    METs 4      NuStep   Level 3    SPM 80    Minutes 15    METs 4      REL-XR   Level 2    Speed 50    Minutes 15    METs 4      T5 Nustep   Level 2    SPM 80    Minutes 15    METs 4      Prescription Details   Frequency (times per week) 3    Duration Progress to 30 minutes of continuous aerobic without signs/symptoms of physical distress      Intensity   THRR 40-80% of Max Heartrate 121-152    Ratings of Perceived Exertion 11-13    Perceived Dyspnea 0-4      Resistance Training   Training Prescription Yes    Weight 3 lb    Reps 10-15           Perform Capillary Blood Glucose checks as needed.  Exercise Prescription Changes:  Exercise Prescription Changes    Row Name 09/19/20 1400 10/04/20 1500 11/01/20 1100 11/15/20 1200 12/28/20 1500     Response to Exercise   Blood Pressure (Admit) 110/66 122/70 110/62 126/74 126/68   Blood Pressure (Exercise) 154/82 152/66 172/70 152/62 156/72   Blood Pressure (Exit) 116/68 110/66 110/60 106/60 118/64   Heart Rate (Admit) 91 bpm 100 bpm 109 bpm 104 bpm 87 bpm   Heart Rate (Exercise) 141 bpm 134 bpm 128 bpm 147 bpm 105 bpm   Heart Rate (Exit) 90 bpm 109 bpm 102 bpm 88 bpm 91 bpm   Oxygen Saturation (Admit) 99 % -- -- -- --   Oxygen Saturation (Exercise) 98 % -- -- -- --   Rating of Perceived Exertion (Exercise) 11 14 13 16 11    Perceived Dyspnea (Exercise) 2 -- -- -- --   Symptoms none none  none none none   Duration -- Continue with 30 min of aerobic exercise without signs/symptoms of physical distress. Continue with 30 min of aerobic exercise without signs/symptoms of physical distress. Continue with 30 min of aerobic exercise without signs/symptoms of physical distress. Continue with 30 min of aerobic exercise without signs/symptoms of physical distress.   Intensity -- THRR unchanged THRR unchanged THRR unchanged THRR unchanged     Progression   Progression -- Continue to progress workloads to maintain intensity without signs/symptoms of physical distress. Continue to progress workloads to maintain intensity without signs/symptoms of physical distress. Continue to progress workloads to maintain intensity without signs/symptoms of physical distress. Continue to progress workloads to maintain intensity without signs/symptoms of physical distress.   Average METs -- 2.85 2.45 3.26 2.8     Resistance Training   Training Prescription -- Yes Yes Yes Yes   Weight -- 3 lb 3 lb 4 lb 4 lb   Reps -- 10-15 10-15 10-15 10-15     Interval Training   Interval Training -- No No No No     Treadmill   MPH -- 2.6 2.4 2.6 2.6   Grade -- 2.5 2 2 2    Minutes -- 15 15 15 15    METs -- 3.89 3.5 3.89 3.71     NuStep   Level -- -- -- -- 3   SPM -- -- -- -- 80   Minutes -- -- -- -- 15  METs -- -- -- -- 2     Recumbant Elliptical   Level -- -- 1 -- --   Minutes -- -- 15 -- --   METs -- -- 1.4 -- --     REL-XR   Level -- -- -- -- 3   Speed -- -- -- -- 50   Minutes -- -- -- -- 15   METs -- -- -- -- 2.7     T5 Nustep   Level -- 2 -- -- --   Minutes -- 15 -- -- --   METs -- 1.8 -- -- --          Exercise Comments:  Exercise Comments    Row Name 09/21/20 1458           Exercise Comments First full day of exercise!  Patient was oriented to gym and equipment including functions, settings, policies, and procedures.  Patient's individual exercise prescription and treatment plan were  reviewed.  All starting workloads were established based on the results of the 6 minute walk test done at initial orientation visit.  The plan for exercise progression was also introduced and progression will be customized based on patient's performance and goals.              Exercise Goals and Review:  Exercise Goals    Row Name 09/19/20 1458             Exercise Goals   Increase Physical Activity Yes       Intervention Provide advice, education, support and counseling about physical activity/exercise needs.;Develop an individualized exercise prescription for aerobic and resistive training based on initial evaluation findings, risk stratification, comorbidities and participant's personal goals.       Expected Outcomes Short Term: Attend rehab on a regular basis to increase amount of physical activity.;Long Term: Add in home exercise to make exercise part of routine and to increase amount of physical activity.;Long Term: Exercising regularly at least 3-5 days a week.       Increase Strength and Stamina Yes       Intervention Provide advice, education, support and counseling about physical activity/exercise needs.;Develop an individualized exercise prescription for aerobic and resistive training based on initial evaluation findings, risk stratification, comorbidities and participant's personal goals.       Expected Outcomes Short Term: Increase workloads from initial exercise prescription for resistance, speed, and METs.;Short Term: Perform resistance training exercises routinely during rehab and add in resistance training at home;Long Term: Improve cardiorespiratory fitness, muscular endurance and strength as measured by increased METs and functional capacity (6MWT)       Able to understand and use rate of perceived exertion (RPE) scale Yes       Intervention Provide education and explanation on how to use RPE scale       Expected Outcomes Short Term: Able to use RPE daily in rehab to express  subjective intensity level;Long Term:  Able to use RPE to guide intensity level when exercising independently       Able to understand and use Dyspnea scale Yes       Intervention Provide education and explanation on how to use Dyspnea scale       Expected Outcomes Short Term: Able to use Dyspnea scale daily in rehab to express subjective sense of shortness of breath during exertion;Long Term: Able to use Dyspnea scale to guide intensity level when exercising independently       Knowledge and understanding of Target Heart Rate Range (  THRR) Yes       Intervention Provide education and explanation of THRR including how the numbers were predicted and where they are located for reference       Expected Outcomes Short Term: Able to state/look up THRR;Short Term: Able to use daily as guideline for intensity in rehab;Long Term: Able to use THRR to govern intensity when exercising independently       Able to check pulse independently Yes       Intervention Provide education and demonstration on how to check pulse in carotid and radial arteries.;Review the importance of being able to check your own pulse for safety during independent exercise       Expected Outcomes Short Term: Able to explain why pulse checking is important during independent exercise;Long Term: Able to check pulse independently and accurately       Understanding of Exercise Prescription Yes       Intervention Provide education, explanation, and written materials on patient's individual exercise prescription       Expected Outcomes Short Term: Able to explain program exercise prescription;Long Term: Able to explain home exercise prescription to exercise independently              Exercise Goals Re-Evaluation :  Exercise Goals Re-Evaluation    Row Name 09/21/20 1458 10/04/20 1526 10/30/20 1409 11/01/20 1140 11/15/20 1211     Exercise Goal Re-Evaluation   Exercise Goals Review Able to understand and use rate of perceived exertion (RPE)  scale;Able to understand and use Dyspnea scale;Knowledge and understanding of Target Heart Rate Range (THRR);Understanding of Exercise Prescription Increase Physical Activity;Increase Strength and Stamina;Understanding of Exercise Prescription Increase Physical Activity;Increase Strength and Stamina Increase Physical Activity;Increase Strength and Stamina;Understanding of Exercise Prescription Increase Physical Activity;Increase Strength and Stamina   Comments Reviewed RPE and dyspnea scales, THR and program prescription with pt today.  Pt voiced understanding and was given a copy of goals to take home. Daisi is off to a good start to rehab.  She has completed her first two full days of rehab.  We will continue to monitor her progress. Taraoluwa has a TM at home.  Staff have not reviewed home exercise yet. Kelsei is doing well in rehab.  Her attendance has gotten better recently.  We will review home exercise guideline with her.  She is up to 2.6 mph on the treadmill.  We will continue to monitor her progress. Jodean is progressing well. She has incresaed speed on TM and weight to 4 lb for strength work.  We will continue to monitor progress.   Expected Outcomes Short: Use RPE daily to regulate intensity. Long: Follow program prescription in THR. Short: Continue to attend regularly Long: Continue to follow program prescription Short:  review home exercise with staff Long:  improve stamina Short:  review home exercise with staff Long:  improve stamina Short: contiue to attend consistently Long: improve overall stamina   Row Name 11/29/20 1150 12/21/20 1748 12/28/20 1544 01/04/21 1749 01/15/21 1734     Exercise Goal Re-Evaluation   Exercise Goals Review Understanding of Exercise Prescription Increase Physical Activity;Increase Strength and Stamina Increase Physical Activity;Increase Strength and Stamina Increase Physical Activity;Increase Strength and Stamina;Able to understand and use rate of perceived  exertion (RPE) scale;Knowledge and understanding of Target Heart Rate Range (THRR);Able to check pulse independently Increase Physical Activity;Increase Strength and Stamina;Able to understand and use rate of perceived exertion (RPE) scale;Knowledge and understanding of Target Heart Rate Range (THRR);Able to check pulse independently  Comments Marlet has started a new job and been out since last review.  She is supposed to return this week at a new class time. Mazzie has been on her treadmill at home little by little. She has returned to rehab to continue exercise after having an ICD placement. Shalini is starting to return back to rehab slowly after her ICD placement. Danai has anxiety and is not confident with her exercise just yet, we have talked with her and gave her education on stress management and anxiety. Kaylianna is slowing building up her workloads here after she gets back into her routine. Pt able to follow exercise prescription today without complaint.  Will continue to monitor for progression.  Reviewed home exercise with pt today.  Pt plans to walk and join a gym for exercise.  Reviewed THR, pulse, RPE, sign and symptoms, pulse oximetery and when to call 911 or MD.  Also discussed weather considerations and indoor options.  Pt voiced understanding. Burlene has been walking on her treadmill at home as well as a track oudoors. She walks with her daughters sometimes which helps time go by. She only does 15 minutes of exercise, breaks, and then does another 15 minutes as she is nervous to do the full 30 minutes as she is afraid something will happen. We talked about the confidence piece, and stressed importance of monitoring herself, vitals, and symptoms at home. She is aware of what to look out for and when to call 911. She also needs to call her doctors if there are any concerns   Expected Outcomes Short: contiue to attend consistently Long: improve overall stamina Short: continue rehab to regain  strength. Long: maintain exercise post [facility]. Short: Build up confidence with exercise Long: Exercise at home with appropriate exercise prescription Short: monitor HR when exercising at home Long:  maintain execise on her own Short: Continue to monitor vitals at home during exercise Long: Exercise at appropriate prescription at home          Discharge Exercise Prescription (Final Exercise Prescription Changes):  Exercise Prescription Changes - 12/28/20 1500      Response to Exercise   Blood Pressure (Admit) 126/68    Blood Pressure (Exercise) 156/72    Blood Pressure (Exit) 118/64    Heart Rate (Admit) 87 bpm    Heart Rate (Exercise) 105 bpm    Heart Rate (Exit) 91 bpm    Rating of Perceived Exertion (Exercise) 11    Symptoms none    Duration Continue with 30 min of aerobic exercise without signs/symptoms of physical distress.    Intensity THRR unchanged      Progression   Progression Continue to progress workloads to maintain intensity without signs/symptoms of physical distress.    Average METs 2.8      Resistance Training   Training Prescription Yes    Weight 4 lb    Reps 10-15      Interval Training   Interval Training No      Treadmill   MPH 2.6    Grade 2    Minutes 15    METs 3.71      NuStep   Level 3    SPM 80    Minutes 15    METs 2      REL-XR   Level 3    Speed 50    Minutes 15    METs 2.7           Nutrition:  Target Goals: Understanding of  nutrition guidelines, daily intake of sodium 1500mg , cholesterol 200mg , calories 30% from fat and 7% or less from saturated fats, daily to have 5 or more servings of fruits and vegetables.  Education: All About Nutrition: -Group instruction provided by verbal, written material, interactive activities, discussions, models, and posters to present general guidelines for heart healthy nutrition including fat, fiber, MyPlate, the role of sodium in heart healthy nutrition, utilization of the nutrition label,  and utilization of this knowledge for meal planning. Follow up email sent as well. Written material given at graduation. Flowsheet Row Cardiac Rehab from 11/15/2020 in [facility] Cardiac and Pulmonary Rehab  Date 11/15/20  Educator [facility]  Instruction Review Code 1- Verbalizes Understanding      Biometrics:  Pre Biometrics - 09/19/20 1459      Pre Biometrics   Height 5' 6.5" (1.689 m)    Weight 186 lb (84.4 kg)    BMI (Calculated) 29.57    Single Leg Stand 19.26 seconds            Nutrition Therapy Plan and Nutrition Goals:  Nutrition Therapy & Goals - 10/30/20 1439      Nutrition Therapy   Diet Heart healhty, low Na, diabetes friendly    Drug/Food Interactions Statins/Certain Fruits    Protein (specify units) 65g    Fiber 25 grams    Whole Grain Foods 3 servings    Saturated Fats 12 max. grams    Fruits and Vegetables 8 servings/day    Sodium 1.5 grams      Personal Nutrition Goals   Nutrition Goal ST: practice counting carbohydrates, use MyPlate structure and count carbohydrates (3-4 servings for meals, 1-2 for snacks) LT: Be independent managing diabetes    Comments 2 meals - lunch and dinner. Sometimes will have boiled egg and toast (white wheat) - she likes whole wheat bread. L: tuna or cheese toast (whole wheat) D: take out - tries to get baked chicken, mashed poatoes, and sweet potatoes, brussells sprouts. Discussed heart healthy eating and diabetes friendly eating. Discussed which foods were CHO servings and how to count CHOs servings.      Intervention Plan   Intervention Prescribe, educate and counsel regarding individualized specific dietary modifications aiming towards targeted core components such as weight, hypertension, lipid management, diabetes, heart failure and other comorbidities.;Nutrition handout(s) given to patient.    Expected Outcomes Short Term Goal: Understand basic principles of dietary content, such as calories, fat, sodium, cholesterol and nutrients.;Short  Term Goal: A plan has been developed with personal nutrition goals set during dietitian appointment.;Long Term Goal: Adherence to prescribed nutrition plan.           Nutrition Assessments:  MEDIFICTS Score Key:  ?70 Need to make dietary changes   40-70 Heart Healthy Diet  ? 40 Therapeutic Level Cholesterol Diet  Flowsheet Row Cardiac Rehab from 09/19/2020 in [facility] Cardiac and Pulmonary Rehab  Picture Your Plate Total Score on Admission 44     Picture Your Plate Scores:  [id number] Unhealthy dietary pattern with much room for improvement.  41-50 Dietary pattern unlikely to meet recommendations for good health and room for improvement.  51-60 More healthful dietary pattern, with some room for improvement.   >60 Healthy dietary pattern, although there may be some specific behaviors that could be improved.    Nutrition Goals Re-Evaluation:  Nutrition Goals Re-Evaluation    Wendell Name 11/15/20 1449 12/21/20 1807 01/16/21 1120         Goals   Current Weight -- 187  lb (84.8 kg) --     Nutrition Goal ST: practice counting carbohydrates, use MyPlate structure and count carbohydrates (3-4 servings for meals, 1-2 for snacks) LT: Be independent managing diabetes Lose Weight, have a diet plan ST: come to rehab consistenly, schedule meeting with RD to reiterate diet guidelines and help with meal planning LT: apply nutrition information on her own     Comment Shineka hasn't been able to work on her goals set with the dietician due to increase of stress in her life. She reports wanting to start working on these goals now and already has some meal plans in place. Nan wants to meet with the dietician to go over her diet habits. She has alot of questions about the things she is eating. RD has discussed education and meal planning with patient during initial consulation, however, patient still voicing she needs much more help. Discussed setting up another appointment during one of her exercise times in  April - she was then out for some time, just coming back yesterday 01/15/21. Unable to set up a time as she has not been coming consistently, RD will try again to schedule for next week.     Expected Outcome Short: incorporate the goals set with the dietician of counting carbs. Long: maintain independence with managing heart healthy diet Short: spealk with the dietician. Long: maintain a diet that pertains to her ST: come to rehab consistenly, schedule meeting with RD to reiterate diet guidelines and help with meal planning LT: apply nutrition information on her own            Nutrition Goals Discharge (Final Nutrition Goals Re-Evaluation):  Nutrition Goals Re-Evaluation - 01/16/21 1120      Goals   Nutrition Goal ST: come to rehab consistenly, schedule meeting with RD to reiterate diet guidelines and help with meal planning LT: apply nutrition information on her own    Comment RD has discussed education and meal planning with patient during initial consulation, however, patient still voicing she needs much more help. Discussed setting up another appointment during one of her exercise times in April - she was then out for some time, just coming back yesterday 01/15/21. Unable to set up a time as she has not been coming consistently, RD will try again to schedule for next week.    Expected Outcome ST: come to rehab consistenly, schedule meeting with RD to reiterate diet guidelines and help with meal planning LT: apply nutrition information on her own           Psychosocial: Target Goals: Acknowledge presence or absence of significant depression and/or stress, maximize coping skills, provide positive support system. Participant is able to verbalize types and ability to use techniques and skills needed for reducing stress and depression.   Education: Stress, Anxiety, and Depression - Group verbal and visual presentation to define topics covered.  Reviews how body is impacted by stress, anxiety, and  depression.  Also discusses healthy ways to reduce stress and to treat/manage anxiety and depression.  Written material given at graduation.   Education: Sleep Hygiene -Provides group verbal and written instruction about how sleep can affect your health.  Define sleep hygiene, discuss sleep cycles and impact of sleep habits. Review good sleep hygiene tips.    Initial Review & Psychosocial Screening:  Initial Psych Review & Screening - 09/11/20 1145      Initial Review   Current issues with Current Anxiety/Panic;Current Stress Concerns    Source of Stress Concerns Chronic  Illness    Comments Stress over what her surgery and her recovery will do to her Diabetes, getting back to work safely and understanding all that happened.      Family Dynamics   Good Support System? Yes   daughter, brothers.     Barriers   Psychosocial barriers to participate in program There are no identifiable barriers or psychosocial needs.      Screening Interventions   Interventions Encouraged to exercise;To provide support and resources with identified psychosocial needs    Expected Outcomes Short Term goal: Utilizing psychosocial counselor, staff and physician to assist with identification of specific Stressors or current issues interfering with healing process. Setting desired goal for each stressor or current issue identified.;Long Term Goal: Stressors or current issues are controlled or eliminated.;Short Term goal: Identification and review with participant of any Quality of Life or Depression concerns found by scoring the questionnaire.;Long Term goal: The participant improves quality of Life and PHQ9 Scores as seen by post scores and/or verbalization of changes           Quality of Life Scores:   Quality of Life - 09/19/20 1500      Quality of Life   Select Quality of Life      Quality of Life Scores   Health/Function Pre 21.8 %    Socioeconomic Pre 17.44 %    Psych/Spiritual Pre 24 %    Family Pre  25.8 %    GLOBAL Pre 21.8 %          Scores of 19 and below usually indicate a poorer quality of life in these areas.  A difference of  2-3 points is a clinically meaningful difference.  A difference of 2-3 points in the total score of the Quality of Life Index has been associated with significant improvement in overall quality of life, self-image, physical symptoms, and general health in studies assessing change in quality of life.  PHQ-9: Recent Review Flowsheet Data    Depression screen [facility] 2/9 12/21/2020 09/19/2020   Decreased Interest 0 1   Down, Depressed, Hopeless 1 1   PHQ - 2 Score 1 2   Altered sleeping 1 1   Tired, decreased energy 1 1   Change in appetite 1 1   Feeling bad or failure about yourself  1 0   Trouble concentrating 0 0   Moving slowly or fidgety/restless 0 0   Suicidal thoughts 0 0   PHQ-9 Score 5 5   Difficult doing work/chores Not difficult at all Not difficult at all     Interpretation of Total Score  Total Score Depression Severity:  1-4 = Minimal depression, 5-9 = Mild depression, 10-14 = Moderate depression, 15-19 = Moderately severe depression, 20-27 = Severe depression   Psychosocial Evaluation and Intervention:  Psychosocial Evaluation - 09/11/20 1202      Psychosocial Evaluation & Interventions   Interventions Encouraged to exercise with the program and follow exercise prescription;Relaxation education    Comments Betta has no barriers to entry to the program. She has some anxiety about her chest "opening up if she pushes to hard". Spoke to her that we have never seen this happen here as  we will start slow and work until she feels healed before increasing the her workloads.  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 We spoke about her talking with her HR department to see how she might be able to start with light duty until she  feels strong enough to get back to her norm at work. She  has a good support system with her daughter  and her 2 brothers. She is ready to get started and learn about her heart disease, learn the exercise and nutriton steps to lead to a healthy lifestyle and continue to work on managing her diabetes. Henriette should do well in the program.    Expected Outcomes STG: Attends all scheduled sessions:exercise and education. Continues to verbalize any stress or fears   LTG: Rachel able to utilize what she has learned to maintain a healthy lifestyle and has been able to diminish her stress and fears about her heart disease .    Continue Psychosocial Services  Follow up required by staff           Psychosocial Re-Evaluation:  Psychosocial Re-Evaluation    [place] Name 10/30/20 1400 11/15/20 1438 12/21/20 1804 12/27/20 1804 01/15/21 1758     Psychosocial Re-Evaluation   Current issues with Current Stress Concerns;Current Sleep Concerns Current Stress Concerns;Current Sleep Concerns History of Depression;Current Anxiety/Panic History of Depression;Current Anxiety/Panic Current Anxiety/Panic   Comments Jessicah is still stressed out some about her health.  She feels good about attending HT and the education.  She doesnt want to go back to work yet.  Staff suggested she talk to HR at her employer about disability.  She sleeps well sometimes - she does have a prescirption for sleep medication.  She will ask Dr about melatonin. Lavra's anxiety has been hightened these last few weeks as she has gone to her follow up doctor appointments and didn't receive the news she was hoping. Her EF has stayed the same at 30-35% which means they are talking about placing an ICD and trialing new medications. They already placed a loop recorder and she continues ot have bouts of SVT. She is very anxious about her prognosis and feels like she has been given a death sentence. She wants to do anything she can to help her live a long life. Information was given by staff and by her cardiologist about ICD and heart failure. She found a new  work from home job and starts training next week. She is hoping she can manage that schedule. Her sleep has taken a hit given all the stress, she has been scared to try the clonidine her MD gave her but may try 1/2 a pill since she is so tired from stressing and not sleeping. Her family is supportive, although she states one of her brothers can be a little much when it comes to giving her advice. She is too scared to seek out a personal relationship because she doesn't know what her prognosis is. Reviewed patient health questionnaire (PHQ-9) with patient for follow up. Previously, patients score indicated signs/symptoms of depression.  Reviewed to see if patient is improving symptom wise while in program.  Score stayed the same. She has some anxiety about her new ICD. Informed her about the machine and what it is used for. Patient was very anxious today regarding her ICD, heart failure and diabetes. RD to set up follow up appointment regarding diet. She is nervous about her lightheadedness; discussed medications, heart failure, and role of anxiety in this. Encouraged attendance at rehab and talk therapy - she is scheduled to see someone tuesday. Discussed relaxation techniques and breathing techniques as well as the importance of laughter. Elisabet has been very nervous still about her heart situation. She was prescribed Xanax before, and she felt it helped  but was stopped by a doctor. She does plan to ask her doctor again if she is appropriate for another oral medication to help with her anxiety, however, she is also nervous something is going to interact with her heart medications. She will talk to her doctor about that. and also seeing somebody for her mental health. We talked about using deep breathing and guided meditation on [email or web address] She was given another home exercise packet that also shows how to do the progressive muscular relaxation. She has had a lot of support and advice from staff members here on  working with her anxiety. She knows when to call her doctor. She can count on her daughter for support at home as well.   Expected Outcomes Short:  follow up with Dr about sleep meds Long: develop good stress management and sleep habits Short: follow up with EP on 3/23 to discuss ICD placement and options, try taking medication to sleep. Long: develop and maintain stress management techniques while continuing to become educated on her diagnosis Short: Continue to work toward an improvement in [place] by attending [facility] regularly. Long: Continue to improve stress and depression coping skills by talking with staff and attending [facility] regularly and work toward a positive mental state. Short: Continue to work toward an improvement in [place] scores by attending [facility] regularly. Talk with counselor. Long: Continue to improve stress and depression coping skills by talking with staff and attending [facility] regularly and work toward a positive mental state. Short: Talk to doctor about medications Long: Work on personal goals for mental health, use exercise to help with stress management/relaxation  and positive attitude   Interventions -- Relaxation education;Encouraged to attend Cardiac Rehabilitation for the exercise;Stress management education Encouraged to attend Cardiac Rehabilitation for the exercise Encouraged to attend Cardiac Rehabilitation for the exercise Encouraged to attend Cardiac Rehabilitation for the exercise   Continue Psychosocial Services  -- Follow up required by staff Follow up required by staff Follow up required by staff Follow up required by staff     Initial Review   Source of Stress Concerns -- -- -- Chronic Illness;Financial --          Psychosocial Discharge (Final Psychosocial Re-Evaluation):  Psychosocial Re-Evaluation - 01/15/21 1758      Psychosocial Re-Evaluation   Current issues with Current Anxiety/Panic    Comments Paisleigh has been very nervous still  about her heart situation. She was prescribed Xanax before, and she felt it helped but was stopped by a doctor. She does plan to ask her doctor again if she is appropriate for another oral medication to help with her anxiety, however, she is also nervous something is going to interact with her heart medications. She will talk to her doctor about that. and also seeing somebody for her mental health. We talked about using deep breathing and guided meditation on [email or web address] She was given another home exercise packet that also shows how to do the progressive muscular relaxation. She has had a lot of support and advice from staff members here on working with her anxiety. She knows when to call her doctor. She can count on her daughter for support at home as well.    Expected Outcomes Short: Talk to doctor about medications Long: Work on personal goals for mental health, use exercise to help with stress management/relaxation  and positive attitude    Interventions Encouraged to attend Cardiac Rehabilitation for the exercise    Continue Psychosocial Services  Follow up required by staff  Vocational Rehabilitation: Provide vocational rehab assistance to qualifying candidates.   Vocational Rehab Evaluation & Intervention:  Vocational Rehab - 09/11/20 1153      Initial Vocational Rehab Evaluation & Intervention   Assessment shows need for Vocational Rehabilitation No           Education: Education Goals: Education classes will be provided on a variety of topics geared toward better understanding of heart health and risk factor modification. Participant will state understanding/return demonstration of topics presented as noted by education test scores.  Learning Barriers/Preferences:  Learning Barriers/Preferences - 09/11/20 1152      Learning Barriers/Preferences   Learning Barriers None    Learning Preferences Video           General Cardiac Education Topics:  AED/CPR: - Group  verbal and written instruction with the use of models to demonstrate the basic use of the AED with the basic ABC's of resuscitation.   Anatomy and Cardiac Procedures: - Group verbal and visual presentation and models provide information about basic cardiac anatomy and function. Reviews the testing methods done to diagnose heart disease and the outcomes of the test results. Describes the treatment choices: Medical Management, Angioplasty, or Coronary Bypass Surgery for treating various heart conditions including Myocardial Infarction, Angina, Valve Disease, and Cardiac Arrhythmias.  Written material given at graduation.   Medication Safety: - Group verbal and visual instruction to review commonly prescribed medications for heart and lung disease. Reviews the medication, class of the drug, and side effects. Includes the steps to properly store meds and maintain the prescription regimen.  Written material given at graduation.   Intimacy: - Group verbal instruction through game format to discuss how heart and lung disease can affect sexual intimacy. Written material given at graduation..   Know Your Numbers and Heart Failure: - Group verbal and visual instruction to discuss disease risk factors for cardiac and pulmonary disease and treatment options.  Reviews associated critical values for Overweight/Obesity, Hypertension, Cholesterol, and Diabetes.  Discusses basics of heart failure: signs/symptoms and treatments.  Introduces Heart Failure Zone chart for action plan for heart failure.  Written material given at graduation.   Infection Prevention: - Provides verbal and written material to individual with discussion of infection control including proper hand washing and proper equipment cleaning during exercise session. Flowsheet Row Cardiac Rehab from 11/15/2020 in [facility] Cardiac and Pulmonary Rehab  Date 09/19/20  Educator AS  Instruction Review Code 1- Verbalizes Understanding      Falls  Prevention: - Provides verbal and written material to individual with discussion of falls prevention and safety. Flowsheet Row Cardiac Rehab from 11/15/2020 in [facility] Cardiac and Pulmonary Rehab  Date 09/19/20  Educator AS  Instruction Review Code 1- Verbalizes Understanding      Other: -Provides group and verbal instruction on various topics (see comments)   Knowledge Questionnaire Score:  Knowledge Questionnaire Score - 09/19/20 1501      Knowledge Questionnaire Score   Pre Score 23/26 angina nutrition           Core Components/Risk Factors/Patient Goals at Admission:  Personal Goals and Risk Factors at Admission - 09/19/20 1459      Core Components/Risk Factors/Patient Goals on Admission    Weight Management Weight Loss;Yes    Intervention Weight Management/Obesity: Establish reasonable short term and long term weight goals.;Weight Management: Develop a combined nutrition and exercise program designed to reach desired caloric intake, while maintaining appropriate intake of nutrient and fiber, sodium and fats, and appropriate energy expenditure  required for the weight goal.;Weight Management: Provide education and appropriate resources to help participant work on and attain dietary goals.    Admit Weight 186 lb (84.4 kg)    Goal Weight: Short Term 180 lb (81.6 kg)    Goal Weight: Long Term 160 lb (72.6 kg)    Expected Outcomes Short Term: Continue to assess and modify interventions until short term weight is achieved;Long Term: Adherence to nutrition and physical activity/exercise program aimed toward attainment of established weight goal;Weight Loss: Understanding of general recommendations for a balanced deficit meal plan, which promotes 1-2 lb weight loss per week and includes a negative energy balance of [phone] kcal/d    Diabetes Yes    Intervention Provide education about signs/symptoms and action to take for hypo/hyperglycemia.;Provide education about proper nutrition,  including hydration, and aerobic/resistive exercise prescription along with prescribed medications to achieve blood glucose in normal ranges: Fasting glucose 65-99 mg/dL    Expected Outcomes Short Term: Participant verbalizes understanding of the signs/symptoms and immediate care of hyper/hypoglycemia, proper foot care and importance of medication, aerobic/resistive exercise and nutrition plan for blood glucose control.;Long Term: Attainment of HbA1C < 7%.    Intervention Provide education on lifestyle modifcations including regular physical activity/exercise, weight management, moderate sodium restriction and increased consumption of fresh fruit, vegetables, and low fat dairy, alcohol moderation, and smoking cessation.;Monitor prescription use compliance.    Expected Outcomes Short Term: Continued assessment and intervention until BP is < 140/73mm HG in hypertensive participants. < 130/71mm HG in hypertensive participants with diabetes, heart failure or chronic kidney disease.;Long Term: Maintenance of blood pressure at goal levels.    Lipids Yes    Intervention Provide education and support for participant on nutrition & aerobic/resistive exercise along with prescribed medications to achieve LDL 70mg , HDL >40mg .    Expected Outcomes Short Term: Participant states understanding of desired cholesterol values and is compliant with medications prescribed. Participant is following exercise prescription and nutrition guidelines.;Long Term: Cholesterol controlled with medications as prescribed, with individualized exercise RX and with personalized nutrition plan. Value goals: LDL < 70mg , HDL > 40 mg.           Education:Diabetes - Individual verbal and written instruction to review signs/symptoms of diabetes, desired ranges of glucose level fasting, after meals and with exercise. Acknowledge that pre and post exercise glucose checks will be done for 3 sessions at entry of program. [place] from 11/15/2020 in [facility] Cardiac and Pulmonary Rehab  Date 09/19/20  Educator AS  Instruction Review Code 1- Verbalizes Understanding      Core Components/Risk Factors/Patient Goals Review:   Goals and Risk Factor Review    Row Name 10/30/20 1356 11/15/20 1429 12/21/20 1757 01/15/21 1748       Core Components/Risk Factors/Patient Goals Review   Personal Goals Review Weight Management/Obesity;Hypertension;Lipids;Diabetes Weight Management/Obesity;Hypertension;Diabetes;Heart Failure Weight Management/Obesity;Diabetes;Hypertension Weight Management/Obesity;Diabetes;Hypertension    Review Vera is monitoring BP and BG at home.  Her normal FBG is around 90-95.  She also monitors BP at home .  Today was 110/62 at [facility].  She is taking all medications as directed. Vernica's BP is stable and is monitoring it while the cardiologist is trialing medications for her heart failure. Her diabetes is being managed well, so well that her MD has decreased her diabetic medication. She recently had a repeat echo and her EF was 30-35%, which was unchanged. Since it did not improve and she is still having runs of SVT they are talking with her about  getting an ICD and has appointment with the electrophysiologist Talicia wants to get down to 160 pounds. She has returned to rehab after ICD and is ready to continue. Her blood sugars have been 109 in the morning fasting. She feels like she has her sugar in check. Her blood pressure has been stable and is checking it at home. Amayrany has been checking her blood sugars about 4x/ day. At fasting, it ranges between 120-130 which she states her doctor is OK with.  She admits when she eats sweets here and there, it shoots up to 200. BP at home she checks is between [id number] systolic/ under [id number] diastolic. Vela's weight has been staying the same, but she does wish to lose weight. She plans to buy a scale so she can monitor better at home. She plans to move more and change her diet to  more "diabetic friendly."    Expected Outcomes Short: continue to monitor vitals at home Long:  manage risk factors long term Short: attend EP appointment to discuss ICD on 3/23 Long: continue managing BP and Diabetes, work with MD on plan for HF Short: lose 5 pounds in the next 2 weeks. Long: reach a weight goal of 160 pounds. Short Get a scale and monitor weight more routinely Long: Continue to manage lifestyle risk factors           Core Components/Risk Factors/Patient Goals at Discharge (Final Review):   Goals and Risk Factor Review - 01/15/21 1748      Core Components/Risk Factors/Patient Goals Review   Personal Goals Review Weight Management/Obesity;Diabetes;Hypertension    Review Fernande has been checking her blood sugars about 4x/ day. At fasting, it ranges between 120-130 which she states her doctor is OK with.  She admits when she eats sweets here and there, it shoots up to 200. BP at home she checks is between [id number] systolic/ under [id number] diastolic. Georgena's weight has been staying the same, but she does wish to lose weight. She plans to buy a scale so she can monitor better at home. She plans to move more and change her diet to more "diabetic friendly."    Expected Outcomes Short Get a scale and monitor weight more routinely Long: Continue to manage lifestyle risk factors           ITP Comments:  ITP Comments    Row Name 09/11/20 1222 09/19/20 1506 09/21/20 1458 10/04/20 0858 10/19/20 1458   ITP Comments Virtual orientation call completed today. shehas an appointment on Date: 09/13/2020  for EP eval and gym Orientation.  Documentation of diagnosis [facility] found in [facility] Date: 08/02/2020. Completed 6MWT and gym orientation. Initial ITP created and sent for review to Dr. Emily Filbert, Medical Director. First full day of exercise!  Patient was oriented to gym and equipment including functions, settings, policies, and procedures.  Patient's individual exercise prescription and treatment plan  were reviewed.  All starting workloads were established based on the results of the 6 minute walk test done at initial orientation visit.  The plan for exercise progression was also introduced and progression will be customized based on patient's performance and goals. 30 Day review completed. Medical Director ITP review done, changes made as directed, and signed approval by Medical Director.  New to program Luellen stopped by today to inform staff she had a loop recorder placed yesterday and she didn't feel comfortable exercising. She was nervous about it and everthing that is going on with her heart. Information and support given  to her as she tries to figure everything out. She plans to return Monday as she really enjoys the program. She was encouraged to reach out to her doctor and support staff with further questions regarding the loop recorder and that more education on a heart healthy lifestyle will be given while in the program.   [place] Name 11/01/20 0620 11/29/20 0727 12/11/20 1609 12/14/20 1208 12/26/20 0823   ITP Comments 30 Day review completed. Medical Director ITP review done, changes made as directed, and signed approval by Medical Director. 30 Day review completed. Medical Director ITP review done, changes made as directed, and signed approval by Medical Director. Called to check on patient.  She had gotten new job. She did decided to get her ICD.  She missed time for training and lost her new job. She would like to return to rehab.  She plans to return on Wednesday 12/13/20. Fernande has not yet returned and has not attended since last review. Noe has attended once since last review.   [place] Name 12/27/20 0943 01/11/21 1739 01/24/21 0640       ITP Comments 30 Day review completed. Medical Director ITP review done, changes made as directed, and signed approval by Medical Director. Auria has not attended since last review 30 Day review completed. Medical Director ITP review done, changes made as  directed, and signed approval by Medical Director.  3 visits in [place]            Comments:

## 2021-01-24 NOTE — Progress Notes (Signed)
Daily Session Note  Patient Details  Name: Stacey Richardson MRN: [id number] Date of Birth: [birth year] Referring Provider:   Flowsheet Row Cardiac Rehab from 09/19/2020 in [facility] Cardiac and Pulmonary Rehab  Referring Provider End      Encounter Date: 01/24/2021  Check In:  Session Check In - 01/24/21 1719      Check-In   Supervising physician immediately available to respond to emergencies See telemetry face sheet for immediately available ER MD    Location [facility]-Cardiac & Pulmonary Rehab    Staff Present Birdie Sons, MPA, RN;Melissa Caiola RDN, LDN;Joseph Lou Miner, MS Exercise Physiologist    Virtual Visit No    Medication changes reported     No    Fall or balance concerns reported    No    Warm-up and Cool-down Performed on first and last piece of equipment    Resistance Training Performed Yes    VAD Patient? No    PAD/SET Patient? No      Pain Assessment   Currently in Pain? No/denies              Social History   Tobacco Use  Smoking Status Former Smoker  Smokeless Tobacco Never Used  Tobacco Comment   Quit 30 years ago    Goals Met:  Independence with exercise equipment Exercise tolerated well No report of cardiac concerns or symptoms Strength training completed today  Goals Unmet:  Not Applicable  Comments: Pt able to follow exercise prescription today without complaint.  Will continue to monitor for progression.    Dr. Emily Filbert is Medical Director for [facility].

## 2021-01-25 ENCOUNTER — Ambulatory Visit: Payer: Self-pay | Admitting: Psychologist

## 2021-01-31 ENCOUNTER — Ambulatory Visit: Payer: 59 | Admitting: Psychologist

## 2021-02-01 ENCOUNTER — Ambulatory Visit: Payer: Self-pay | Admitting: Psychologist

## 2021-02-07 ENCOUNTER — Encounter: Payer: Medicaid Other | Attending: Internal Medicine

## 2021-02-07 ENCOUNTER — Other Ambulatory Visit: Payer: Self-pay | Admitting: Physician Assistant

## 2021-02-07 DIAGNOSIS — Z951 Presence of aortocoronary bypass graft: Secondary | ICD-10-CM | POA: Insufficient documentation

## 2021-02-08 ENCOUNTER — Telehealth: Payer: Self-pay

## 2021-02-08 NOTE — Telephone Encounter (Signed)
Delano has been at [employer] - plans to be back next week

## 2021-02-12 ENCOUNTER — Other Ambulatory Visit: Payer: Self-pay

## 2021-02-12 ENCOUNTER — Encounter: Payer: Medicaid Other | Admitting: *Deleted

## 2021-02-12 DIAGNOSIS — Z951 Presence of aortocoronary bypass graft: Secondary | ICD-10-CM | POA: Diagnosis present

## 2021-02-12 NOTE — Progress Notes (Signed)
Daily Session Note  Patient Details  Name: Stacey Richardson MRN: [id number] Date of Birth: 27-Sep-1965 Referring Provider:   Flowsheet Row Cardiac Rehab from 09/19/2020 in [facility] Cardiac and Pulmonary Rehab  Referring Provider End      Encounter Date: 02/12/2021  Check In:  Session Check In - 02/12/21 1753      Check-In   Supervising physician immediately available to respond to emergencies See telemetry face sheet for immediately available ER MD    Location [facility]-Cardiac & Pulmonary Rehab    Staff Present Heath Lark, RN, BSN, Laveda Norman, BS, ACSM CEP, Exercise Physiologist;Melissa New Holstein RDN, Rowe Pavy, BA, ACSM CEP, Exercise Physiologist    Virtual Visit No    Medication changes reported     No    Fall or balance concerns reported    No    Warm-up and Cool-down Performed on first and last piece of equipment    Resistance Training Performed Yes    VAD Patient? No    PAD/SET Patient? No      Pain Assessment   Currently in Pain? No/denies              Social History   Tobacco Use  Smoking Status Former Smoker  Smokeless Tobacco Never Used  Tobacco Comment   Quit 30 years ago    Goals Met:  Independence with exercise equipment Exercise tolerated well No report of cardiac concerns or symptoms  Goals Unmet:  Not Applicable  Comments: Pt able to follow exercise prescription today without complaint.  Will continue to monitor for progression.    Dr. Emily Filbert is Medical Director for [facility].  Dr. Ottie Glazier is Medical Director for [facility].

## 2021-02-15 ENCOUNTER — Telehealth: Payer: Self-pay

## 2021-02-15 NOTE — Telephone Encounter (Signed)
Called Stacey Richardson as she called out again today. Left message letting her know we will have to discharge her at the end of June as she will be at 24 weeks of being enrolled in the program due to attendance. Still encouraged her to attend as much as she can before that date.

## 2021-02-16 ENCOUNTER — Telehealth: Payer: Self-pay | Admitting: Internal Medicine

## 2021-02-16 NOTE — Telephone Encounter (Signed)
Patient has a question regarding Cardiac Rehab ending soon. Please assist

## 2021-02-19 ENCOUNTER — Encounter: Payer: Medicaid Other | Admitting: *Deleted

## 2021-02-19 ENCOUNTER — Other Ambulatory Visit: Payer: Self-pay

## 2021-02-19 DIAGNOSIS — Z951 Presence of aortocoronary bypass graft: Secondary | ICD-10-CM | POA: Diagnosis not present

## 2021-02-19 NOTE — Telephone Encounter (Signed)
Attempted to call pt back. No answer at this time. Lmtcb.

## 2021-02-19 NOTE — Telephone Encounter (Signed)
Patient returning call .  Patient wants to know if she can be referred to go back to rehab after current session ends.

## 2021-02-19 NOTE — Progress Notes (Signed)
Daily Session Note  Patient Details  Name: Stacey Richardson MRN: [id number] Date of Birth: 1966/06/28 Referring Provider:   Flowsheet Row Cardiac Rehab from 09/19/2020 in [facility] Cardiac and Pulmonary Rehab  Referring Provider End       Encounter Date: 02/19/2021  Check In:  Session Check In - 02/19/21 [place]       Check-In   Supervising physician immediately available to respond to emergencies See telemetry face sheet for immediately available ER MD    Location [facility]-Cardiac & Pulmonary Rehab    Staff Present Renita Papa, RN Margurite Auerbach, MS, ASCM CEP, Exercise Physiologist;Kelly Amedeo Plenty, BS, ACSM CEP, Exercise Physiologist    Virtual Visit No    Medication changes reported     No    Fall or balance concerns reported    No    Warm-up and Cool-down Performed on first and last piece of equipment    Resistance Training Performed Yes    VAD Patient? No    PAD/SET Patient? No      Pain Assessment   Currently in Pain? No/denies                Social History   Tobacco Use  Smoking Status Former   Pack years: 0.00  Smokeless Tobacco Never  Tobacco Comments   Quit 30 years ago    Goals Met:  Independence with exercise equipment Exercise tolerated well No report of cardiac concerns or symptoms Strength training completed today  Goals Unmet:  Not Applicable  Comments: Pt able to follow exercise prescription today without complaint.  Will continue to monitor for progression.    Dr. Emily Filbert is Medical Director for [facility].  Dr. Ottie Glazier is Medical Director for [facility].

## 2021-02-20 NOTE — Telephone Encounter (Signed)
Pt requesting another referral to cardiac rehab after current sessions are completed.  Attempted to call pt to discuss further. No answer. Lmtcb.  Pt has f/u appt with Dr. Saunders Revel 02/28/21.   Cardiac rehab for s/p CABG x 2.  Pt has 12 sessions left.  Per rehab, if a second referral is placed, insurance may not cover without new event.  With h/o HFrEF may likely be covered per rehab.   Will notify Dr. Saunders Revel to make aware since pt returning next week for f/u 6/22.

## 2021-02-21 ENCOUNTER — Other Ambulatory Visit: Payer: Self-pay

## 2021-02-21 ENCOUNTER — Encounter: Payer: Self-pay | Admitting: *Deleted

## 2021-02-21 ENCOUNTER — Encounter: Payer: Medicaid Other | Admitting: *Deleted

## 2021-02-21 DIAGNOSIS — Z951 Presence of aortocoronary bypass graft: Secondary | ICD-10-CM

## 2021-02-21 NOTE — Progress Notes (Signed)
Cardiac Individual Treatment Plan  Patient Details  Name: MONNIE GUDGEL MRN: [id number] Date of Birth: 20-Apr-1966 Referring Provider:   Flowsheet Row Cardiac Rehab from 09/19/2020 in [facility] Cardiac and Pulmonary Rehab  Referring Provider End       Initial Encounter Date:  Flowsheet Row Cardiac Rehab from 09/19/2020 in [facility] Cardiac and Pulmonary Rehab  Date 09/19/20       Visit Diagnosis: S/P CABG x 2  Patient's Home Medications on Admission:  Current Outpatient Medications:    acetaminophen (TYLENOL) 500 MG tablet, Take 500 mg by mouth every 6 (six) hours as needed for moderate pain or headache., Disp: , Rfl:    alum & mag hydroxide-simeth (MAALOX ADVANCED MAX ST) 400-400-40 MG/5ML suspension, Take 15 mLs by mouth every 6 (six) hours as needed for indigestion., Disp: 355 mL, Rfl: 0   aspirin EC 81 MG tablet, Take 81 mg by mouth daily. , Disp: , Rfl:    atorvastatin (LIPITOR) 80 MG tablet, Take 80 mg by mouth at bedtime., Disp: , Rfl:    carvedilol (COREG) 12.5 MG tablet, Take 1 tablet (12.5 mg total) by mouth 2 (two) times daily., Disp: 60 tablet, Rfl: 5   losartan (COZAAR) 25 MG tablet, TAKE 1 TABLET (25 MG TOTAL) BY MOUTH DAILY., Disp: 30 tablet, Rfl: 2   metFORMIN (GLUCOPHAGE-XR) 500 MG 24 hr tablet, Take 500 mg by mouth daily with breakfast., Disp: , Rfl:    spironolactone (ALDACTONE) 25 MG tablet, Take 0.5 tablets (12.5 mg total) by mouth daily., Disp: 45 tablet, Rfl: 1   tetrahydrozoline 0.05 % ophthalmic solution, Place 1-2 drops into both eyes daily as needed (irritated eyes.). visine, Disp: , Rfl:   Past Medical History: Past Medical History:  Diagnosis Date   CAD (coronary artery disease)    CABG (07/2020; LIMA-LAD, RIMA-OM1)   CVA (cerebral vascular accident) ([place])    Diabetes mellitus    HFrEF (heart failure with reduced ejection fraction) ([place])    HLD (hyperlipidemia)    Hypertension    SVT (supraventricular tachycardia) ([place])    s/p ablation (11/2020)     Tobacco Use: Social History   Tobacco Use  Smoking Status Former   Pack years: 0.00  Smokeless Tobacco Never  Tobacco Comments   Quit 30 years ago    Labs: Recent Review Scientist, physiological     Labs for ITP Cardiac and Pulmonary Rehab Latest Ref Rng & Units 08/06/2020 08/06/2020 08/07/2020 08/08/2020 12/11/2020   Cholestrol 100 - 199 mg/dL - - - - 113   LDLCALC 0 - 99 mg/dL - - - - 59   HDL >39 mg/dL - - - - 41   Trlycerides 0 - 149 mg/dL - - - - 60   Hemoglobin A1c 4.8 - 5.6 % - - - - 6.5(H)   PHART 7.350 - 7.450 7.328(L) 7.321(L) - - -   PCO2ART 32.0 - 48.0 mmHg 37.0 38.5 - - -   HCO3 20.0 - 28.0 mmol/L 19.4(L) 19.9(L) - - -   TCO2 22 - 32 mmol/L 20(L) 21(L) - - -   ACIDBASEDEF 0.0 - 2.0 mmol/L 6.0(H) 6.0(H) - - -   O2SAT % 99.0 99.0 74.6 62.1 -        Exercise Target Goals: Exercise Program Goal: Individual exercise prescription set using results from initial 6 min walk test and THRR while considering  patient's activity barriers and safety.   Exercise Prescription Goal: Initial exercise prescription builds to 30-45 minutes a day of aerobic activity,  2-3 days per week.  Home exercise guidelines will be given to patient during program as part of exercise prescription that the participant will acknowledge.   Education: Aerobic Exercise: - Group verbal and visual presentation on the components of exercise prescription. Introduces F.I.T.T principle from ACSM for exercise prescriptions.  Reviews F.I.T.T. principles of aerobic exercise including progression. Written material given at graduation.   Education: Resistance Exercise: - Group verbal and visual presentation on the components of exercise prescription. Introduces F.I.T.T principle from ACSM for exercise prescriptions  Reviews F.I.T.T. principles of resistance exercise including progression. Written material given at graduation.    Education: Exercise & Equipment Safety: - Individual verbal instruction and  demonstration of equipment use and safety with use of the equipment. Flowsheet Row Cardiac Rehab from 01/24/2021 in [facility] Cardiac and Pulmonary Rehab  Date 09/19/20  Educator AS  Instruction Review Code 1- Verbalizes Understanding       Education: Exercise Physiology & General Exercise Guidelines: - Group verbal and written instruction with models to review the exercise physiology of the cardiovascular system and associated critical values. Provides general exercise guidelines with specific guidelines to those with heart or lung disease.    Education: Flexibility, Balance, Mind/Body Relaxation: - Group verbal and visual presentation with interactive activity on the components of exercise prescription. Introduces F.I.T.T principle from ACSM for exercise prescriptions. Reviews F.I.T.T. principles of flexibility and balance exercise training including progression. Also discusses the mind body connection.  Reviews various relaxation techniques to help reduce and manage stress (i.e. Deep breathing, progressive muscle relaxation, and visualization). Balance handout provided to take home. Written material given at graduation. Flowsheet Row Cardiac Rehab from 01/24/2021 in [facility] Cardiac and Pulmonary Rehab  Date 11/08/20  Educator AS  Instruction Review Code 1- Verbalizes Understanding       Activity Barriers & Risk Stratification:  Activity Barriers & Cardiac Risk Stratification - 09/11/20 1142       Activity Barriers & Cardiac Risk Stratification   Activity Barriers None    Cardiac Risk Stratification Moderate             6 Minute Walk:  6 Minute Walk     Row Name 09/19/20 1453         6 Minute Walk   Phase Initial     Distance 1365 feet     Walk Time 6 minutes     # of Rest Breaks 0     MPH 2.58     METS 4.5     RPE 11     Perceived Dyspnea  2     VO2 Peak 15.77     Symptoms No     Resting HR 91 bpm     Resting BP 110/66     Resting Oxygen Saturation  99 %      Exercise Oxygen Saturation  during 6 min walk 98 %     Max Ex. HR 141 bpm     Max Ex. BP 154/82     2 Minute Post BP 116/68              Oxygen Initial Assessment:   Oxygen Re-Evaluation:   Oxygen Discharge (Final Oxygen Re-Evaluation):   Initial Exercise Prescription:  Initial Exercise Prescription - 09/19/20 1400       Date of Initial Exercise RX and Referring Provider   Date 09/19/20    Referring Provider End      Treadmill   MPH 2.6    Grade 2.5  Minutes 15    METs 3.9      Recumbant Bike   Level 2    RPM 60    Watts 50    Minutes 15    METs 4      NuStep   Level 3    SPM 80    Minutes 15    METs 4      REL-XR   Level 2    Speed 50    Minutes 15    METs 4      T5 Nustep   Level 2    SPM 80    Minutes 15    METs 4      Prescription Details   Frequency (times per week) 3    Duration Progress to 30 minutes of continuous aerobic without signs/symptoms of physical distress      Intensity   THRR 40-80% of Max Heartrate 121-152    Ratings of Perceived Exertion 11-13    Perceived Dyspnea 0-4      Resistance Training   Training Prescription Yes    Weight 3 lb    Reps 10-15             Perform Capillary Blood Glucose checks as needed.  Exercise Prescription Changes:   Exercise Prescription Changes     Row Name 09/19/20 1400 10/04/20 1500 11/01/20 1100 11/15/20 1200 12/28/20 1500     Response to Exercise   Blood Pressure (Admit) 110/66 122/70 110/62 126/74 126/68   Blood Pressure (Exercise) 154/82 152/66 172/70 152/62 156/72   Blood Pressure (Exit) 116/68 110/66 110/60 106/60 118/64   Heart Rate (Admit) 91 bpm 100 bpm 109 bpm 104 bpm 87 bpm   Heart Rate (Exercise) 141 bpm 134 bpm 128 bpm 147 bpm 105 bpm   Heart Rate (Exit) 90 bpm 109 bpm 102 bpm 88 bpm 91 bpm   Oxygen Saturation (Admit) 99 % -- -- -- --   Oxygen Saturation (Exercise) 98 % -- -- -- --   Rating of Perceived Exertion (Exercise) 11 14 13 16 11    Perceived Dyspnea  (Exercise) 2 -- -- -- --   Symptoms none none none none none   Duration -- Continue with 30 min of aerobic exercise without signs/symptoms of physical distress. Continue with 30 min of aerobic exercise without signs/symptoms of physical distress. Continue with 30 min of aerobic exercise without signs/symptoms of physical distress. Continue with 30 min of aerobic exercise without signs/symptoms of physical distress.   Intensity -- THRR unchanged THRR unchanged THRR unchanged THRR unchanged     Progression   Progression -- Continue to progress workloads to maintain intensity without signs/symptoms of physical distress. Continue to progress workloads to maintain intensity without signs/symptoms of physical distress. Continue to progress workloads to maintain intensity without signs/symptoms of physical distress. Continue to progress workloads to maintain intensity without signs/symptoms of physical distress.   Average METs -- 2.85 2.45 3.26 2.8     Resistance Training   Training Prescription -- Yes Yes Yes Yes   Weight -- 3 lb 3 lb 4 lb 4 lb   Reps -- 10-15 10-15 10-15 10-15     Interval Training   Interval Training -- No No No No     Treadmill   MPH -- 2.6 2.4 2.6 2.6   Grade -- 2.5 2 2 2    Minutes -- 15 15 15 15    METs -- 3.89 3.5 3.89 3.71     NuStep   Level -- -- -- --  3   SPM -- -- -- -- 80   Minutes -- -- -- -- 15   METs -- -- -- -- 2     Recumbant Elliptical   Level -- -- 1 -- --   Minutes -- -- 15 -- --   METs -- -- 1.4 -- --     REL-XR   Level -- -- -- -- 3   Speed -- -- -- -- 50   Minutes -- -- -- -- 15   METs -- -- -- -- 2.7     T5 Nustep   Level -- 2 -- -- --   Minutes -- 15 -- -- --   METs -- 1.8 -- -- --    Row Name 01/25/21 1500             Response to Exercise   Blood Pressure (Admit) 110/60       Blood Pressure (Exercise) 128/70       Blood Pressure (Exit) 118/68       Heart Rate (Admit) 83 bpm       Heart Rate (Exercise) 126 bpm       Heart Rate  (Exit) 97 bpm       Rating of Perceived Exertion (Exercise) 13       Symptoms none       Duration Continue with 30 min of aerobic exercise without signs/symptoms of physical distress.       Intensity THRR unchanged               Progression     Progression Continue to progress workloads to maintain intensity without signs/symptoms of physical distress.       Average METs 2.69               Resistance Training     Training Prescription Yes       Weight 4 lb       Reps 10-15               Interval Training     Interval Training No               Treadmill     MPH 2.5       Grade 0.5       Minutes 15       METs 3.09               NuStep     Level 3       Minutes 15       METs 2.3               Home Exercise Plan     Plans to continue exercise at Home (comment)  treadmill, gym       Frequency Add 2 additional days to program exercise sessions.       Initial Home Exercises Provided 01/04/21               Exercise Comments:   Exercise Comments     Row Name 09/21/20 1458           Exercise Comments First full day of exercise!  Patient was oriented to gym and equipment including functions, settings, policies, and procedures.  Patient's individual exercise prescription and treatment plan were reviewed.  All starting workloads were established based on the results of the 6 minute walk test done at initial orientation visit.  The plan for exercise progression was also introduced and progression will be customized based  on patient's performance and goals.                Exercise Goals and Review:   Exercise Goals     Row Name 09/19/20 1458             Exercise Goals   Increase Physical Activity Yes       Intervention Provide advice, education, support and counseling about physical activity/exercise needs.;Develop an individualized exercise prescription for aerobic and resistive training based on initial evaluation findings, risk stratification,  comorbidities and participant's personal goals.       Expected Outcomes Short Term: Attend rehab on a regular basis to increase amount of physical activity.;Long Term: Add in home exercise to make exercise part of routine and to increase amount of physical activity.;Long Term: Exercising regularly at least 3-5 days a week.       Increase Strength and Stamina Yes       Intervention Provide advice, education, support and counseling about physical activity/exercise needs.;Develop an individualized exercise prescription for aerobic and resistive training based on initial evaluation findings, risk stratification, comorbidities and participant's personal goals.       Expected Outcomes Short Term: Increase workloads from initial exercise prescription for resistance, speed, and METs.;Short Term: Perform resistance training exercises routinely during rehab and add in resistance training at home;Long Term: Improve cardiorespiratory fitness, muscular endurance and strength as measured by increased METs and functional capacity (6MWT)       Able to understand and use rate of perceived exertion (RPE) scale Yes       Intervention Provide education and explanation on how to use RPE scale       Expected Outcomes Short Term: Able to use RPE daily in rehab to express subjective intensity level;Long Term:  Able to use RPE to guide intensity level when exercising independently       Able to understand and use Dyspnea scale Yes       Intervention Provide education and explanation on how to use Dyspnea scale       Expected Outcomes Short Term: Able to use Dyspnea scale daily in rehab to express subjective sense of shortness of breath during exertion;Long Term: Able to use Dyspnea scale to guide intensity level when exercising independently       Knowledge and understanding of Target Heart Rate Range (THRR) Yes       Intervention Provide education and explanation of THRR including how the numbers were predicted and where they  are located for reference       Expected Outcomes Short Term: Able to state/look up THRR;Short Term: Able to use daily as guideline for intensity in rehab;Long Term: Able to use THRR to govern intensity when exercising independently       Able to check pulse independently Yes       Intervention Provide education and demonstration on how to check pulse in carotid and radial arteries.;Review the importance of being able to check your own pulse for safety during independent exercise       Expected Outcomes Short Term: Able to explain why pulse checking is important during independent exercise;Long Term: Able to check pulse independently and accurately       Understanding of Exercise Prescription Yes       Intervention Provide education, explanation, and written materials on patient's individual exercise prescription       Expected Outcomes Short Term: Able to explain program exercise prescription;Long Term: Able to explain home exercise prescription to exercise independently  Exercise Goals Re-Evaluation :  Exercise Goals Re-Evaluation     Row Name 09/21/20 1458 10/04/20 1526 10/30/20 1409 11/01/20 1140 11/15/20 1211     Exercise Goal Re-Evaluation   Exercise Goals Review Able to understand and use rate of perceived exertion (RPE) scale;Able to understand and use Dyspnea scale;Knowledge and understanding of Target Heart Rate Range (THRR);Understanding of Exercise Prescription Increase Physical Activity;Increase Strength and Stamina;Understanding of Exercise Prescription Increase Physical Activity;Increase Strength and Stamina Increase Physical Activity;Increase Strength and Stamina;Understanding of Exercise Prescription Increase Physical Activity;Increase Strength and Stamina   Comments Reviewed RPE and dyspnea scales, THR and program prescription with pt today.  Pt voiced understanding and was given a copy of goals to take home. Miamarie is off to a good start to rehab.  She has  completed her first two full days of rehab.  We will continue to monitor her progress. Omayra has a TM at home.  Staff have not reviewed home exercise yet. Naima is doing well in rehab.  Her attendance has gotten better recently.  We will review home exercise guideline with her.  She is up to 2.6 mph on the treadmill.  We will continue to monitor her progress. Glora is progressing well. She has incresaed speed on TM and weight to 4 lb for strength work.  We will continue to monitor progress.   Expected Outcomes Short: Use RPE daily to regulate intensity. Long: Follow program prescription in THR. Short: Continue to attend regularly Long: Continue to follow program prescription Short:  review home exercise with staff Long:  improve stamina Short:  review home exercise with staff Long:  improve stamina Short: contiue to attend consistently Long: improve overall stamina    Row Name 11/29/20 1150 12/21/20 1748 12/28/20 1544 01/04/21 1749 01/15/21 1734     Exercise Goal Re-Evaluation   Exercise Goals Review Understanding of Exercise Prescription Increase Physical Activity;Increase Strength and Stamina Increase Physical Activity;Increase Strength and Stamina Increase Physical Activity;Increase Strength and Stamina;Able to understand and use rate of perceived exertion (RPE) scale;Knowledge and understanding of Target Heart Rate Range (THRR);Able to check pulse independently Increase Physical Activity;Increase Strength and Stamina;Able to understand and use rate of perceived exertion (RPE) scale;Knowledge and understanding of Target Heart Rate Range (THRR);Able to check pulse independently   Comments Kellan has started a new job and been out since last review.  She is supposed to return this week at a new class time. Darin has been on her treadmill at home little by little. She has returned to rehab to continue exercise after having an ICD placement. Alyne is starting to return back to rehab slowly after her  ICD placement. Ruthy has anxiety and is not confident with her exercise just yet, we have talked with her and gave her education on stress management and anxiety. Elina is slowing building up her workloads here after she gets back into her routine. Pt able to follow exercise prescription today without complaint.  Will continue to monitor for progression.  Reviewed home exercise with pt today.  Pt plans to walk and join a gym for exercise.  Reviewed THR, pulse, RPE, sign and symptoms, pulse oximetery and when to call 911 or MD.  Also discussed weather considerations and indoor options.  Pt voiced understanding. Reeta has been walking on her treadmill at home as well as a track oudoors. She walks with her daughters sometimes which helps time go by. She only does 15 minutes of exercise, breaks, and then does another 15 minutes as  she is nervous to do the full 30 minutes as she is afraid something will happen. We talked about the confidence piece, and stressed importance of monitoring herself, vitals, and symptoms at home. She is aware of what to look out for and when to call 911. She also needs to call her doctors if there are any concerns   Expected Outcomes Short: contiue to attend consistently Long: improve overall stamina Short: continue rehab to regain strength. Long: maintain exercise post [facility]. Short: Build up confidence with exercise Long: Exercise at home with appropriate exercise prescription Short: monitor HR when exercising at home Long:  maintain execise on her own Short: Continue to monitor vitals at home during exercise Long: Exercise at appropriate prescription at home    Everly Name 01/25/21 1552 02/19/21 1757           Exercise Goal Re-Evaluation   Exercise Goals Review Increase Physical Activity;Increase Strength and Stamina;Understanding of Exercise Prescription Increase Physical Activity;Increase Strength and Stamina      Comments Belvie is doing well in rehab when she attends.   She has attended twice since last update.  We will continue to encourage improved attendance.  Today, she called out with car trouble. We will continue to monitor her progress. --      Expected Outcomes Short: Improved attendance Long: Continue to improve stamina. Short: Improved attendance Long: Continue to improve stamina.               Discharge Exercise Prescription (Final Exercise Prescription Changes):  Exercise Prescription Changes - 01/25/21 1500       Response to Exercise   Blood Pressure (Admit) 110/60    Blood Pressure (Exercise) 128/70    Blood Pressure (Exit) 118/68    Heart Rate (Admit) 83 bpm    Heart Rate (Exercise) 126 bpm    Heart Rate (Exit) 97 bpm    Rating of Perceived Exertion (Exercise) 13    Symptoms none    Duration Continue with 30 min of aerobic exercise without signs/symptoms of physical distress.    Intensity THRR unchanged      Progression   Progression Continue to progress workloads to maintain intensity without signs/symptoms of physical distress.    Average METs 2.69      Resistance Training   Training Prescription Yes    Weight 4 lb    Reps 10-15      Interval Training   Interval Training No      Treadmill   MPH 2.5    Grade 0.5    Minutes 15    METs 3.09      NuStep   Level 3    Minutes 15    METs 2.3      Home Exercise Plan   Plans to continue exercise at Home (comment)   treadmill, gym   Frequency Add 2 additional days to program exercise sessions.    Initial Home Exercises Provided 01/04/21             Nutrition:  Target Goals: Understanding of nutrition guidelines, daily intake of sodium 1500mg , cholesterol 200mg , calories 30% from fat and 7% or less from saturated fats, daily to have 5 or more servings of fruits and vegetables.  Education: All About Nutrition: -Group instruction provided by verbal, written material, interactive activities, discussions, models, and posters to present general guidelines for heart  healthy nutrition including fat, fiber, MyPlate, the role of sodium in heart healthy nutrition, utilization of the nutrition label, and utilization of  this knowledge for meal planning. Follow up email sent as well. Written material given at graduation. Flowsheet Row Cardiac Rehab from 01/24/2021 in [facility] Cardiac and Pulmonary Rehab  Date 11/15/20  Educator [facility]  Instruction Review Code 1- Verbalizes Understanding       Biometrics:  Pre Biometrics - 09/19/20 1459       Pre Biometrics   Height 5' 6.5" (1.689 m)    Weight 186 lb (84.4 kg)    BMI (Calculated) 29.57    Single Leg Stand 19.26 seconds              Nutrition Therapy Plan and Nutrition Goals:  Nutrition Therapy & Goals - 10/30/20 1439       Nutrition Therapy   Diet Heart healhty, low Na, diabetes friendly    Drug/Food Interactions Statins/Certain Fruits    Protein (specify units) 65g    Fiber 25 grams    Whole Grain Foods 3 servings    Saturated Fats 12 max. grams    Fruits and Vegetables 8 servings/day    Sodium 1.5 grams      Personal Nutrition Goals   Nutrition Goal ST: practice counting carbohydrates, use MyPlate structure and count carbohydrates (3-4 servings for meals, 1-2 for snacks) LT: Be independent managing diabetes    Comments 2 meals - lunch and dinner. Sometimes will have boiled egg and toast (white wheat) - she likes whole wheat bread. L: tuna or cheese toast (whole wheat) D: take out - tries to get baked chicken, mashed poatoes, and sweet potatoes, brussells sprouts. Discussed heart healthy eating and diabetes friendly eating. Discussed which foods were CHO servings and how to count CHOs servings.      Intervention Plan   Intervention Prescribe, educate and counsel regarding individualized specific dietary modifications aiming towards targeted core components such as weight, hypertension, lipid management, diabetes, heart failure and other comorbidities.;Nutrition handout(s) given to patient.     Expected Outcomes Short Term Goal: Understand basic principles of dietary content, such as calories, fat, sodium, cholesterol and nutrients.;Short Term Goal: A plan has been developed with personal nutrition goals set during dietitian appointment.;Long Term Goal: Adherence to prescribed nutrition plan.             Nutrition Assessments:  MEDIFICTS Score Key: ?70 Need to make dietary changes  40-70 Heart Healthy Diet ? 40 Therapeutic Level Cholesterol Diet  Flowsheet Row Cardiac Rehab from 09/19/2020 in [facility] Cardiac and Pulmonary Rehab  Picture Your Plate Total Score on Admission 44      Picture Your Plate Scores: <29 Unhealthy dietary pattern with much room for improvement. 41-50 Dietary pattern unlikely to meet recommendations for good health and room for improvement. 51-60 More healthful dietary pattern, with some room for improvement.  >60 Healthy dietary pattern, although there may be some specific behaviors that could be improved.    Nutrition Goals Re-Evaluation:  Nutrition Goals Re-Evaluation     [place] Name 11/15/20 1449 12/21/20 1807 01/16/21 1120 01/24/21 1738 02/19/21 1747     Goals   Current Weight -- 187 lb (84.8 kg) -- -- --   Nutrition Goal ST: practice counting carbohydrates, use MyPlate structure and count carbohydrates (3-4 servings for meals, 1-2 for snacks) LT: Be independent managing diabetes Lose Weight, have a diet plan ST: come to rehab consistenly, schedule meeting with RD to reiterate diet guidelines and help with meal planning LT: apply nutrition information on her own ST: 4 day food log, practice counting CHOs (2-3, 3-4 servings for meals  and 1-2 servings for snacks), eat every 2-3 hours, find and look for recipes LT: apply nutrition information on her own --   Comment Quetzally hasn't been able to work on her goals set with the dietician due to increase of stress in her life. She reports wanting to start working on these goals now and already has some meal  plans in place. Chelcy wants to meet with the dietician to go over her diet habits. She has alot of questions about the things she is eating. RD has discussed education and meal planning with patient during initial consulation, however, patient still voicing she needs much more help. Discussed setting up another appointment during one of her exercise times in April - she was then out for some time, just coming back yesterday 01/15/21. Unable to set up a time as she has not been coming consistently, RD will try again to schedule for next week. RD reviewed heart healthy eating as well as diabetes friendly eating in detail and went over examples. We practiced CHO counting and meal building together. Aydan states she remembers how to count CHO - she has been reading food labels when she eats   Expected Outcome Short: incorporate the goals set with the dietician of counting carbs. Long: maintain independence with managing heart healthy diet Short: spealk with the dietician. Long: maintain a diet that pertains to her ST: come to rehab consistenly, schedule meeting with RD to reiterate diet guidelines and help with meal planning LT: apply nutrition information on her own ST: 4 day food log, practice counting CHOs (2-3, 3-4 servings for meals and 1-2 servings for snacks), eat every 2-3 hours, find and look for recipes LT: apply nutrition information on her own ST: continue to read food labels Long:  maintain diabetic and heart healthy diet            Nutrition Goals Discharge (Final Nutrition Goals Re-Evaluation):  Nutrition Goals Re-Evaluation - 02/19/21 1747       Goals   Comment Zynasia states she remembers how to count CHO - she has been reading food labels when she eats    Expected Outcome ST: continue to read food labels Long:  maintain diabetic and heart healthy diet             Psychosocial: Target Goals: Acknowledge presence or absence of significant depression and/or stress, maximize  coping skills, provide positive support system. Participant is able to verbalize types and ability to use techniques and skills needed for reducing stress and depression.   Education: Stress, Anxiety, and Depression - Group verbal and visual presentation to define topics covered.  Reviews how body is impacted by stress, anxiety, and depression.  Also discusses healthy ways to reduce stress and to treat/manage anxiety and depression.  Written material given at graduation.   Education: Sleep Hygiene -Provides group verbal and written instruction about how sleep can affect your health.  Define sleep hygiene, discuss sleep cycles and impact of sleep habits. Review good sleep hygiene tips.    Initial Review & Psychosocial Screening:  Initial Psych Review & Screening - 09/11/20 1145       Initial Review   Current issues with Current Anxiety/Panic;Current Stress Concerns    Source of Stress Concerns Chronic Illness    Comments Stress over what her surgery and her recovery will do to her Diabetes, getting back to work safely and understanding all that happened.      Family Dynamics   Good Support System? Yes  daughter, brothers.     Barriers   Psychosocial barriers to participate in program There are no identifiable barriers or psychosocial needs.      Screening Interventions   Interventions Encouraged to exercise;To provide support and resources with identified psychosocial needs    Expected Outcomes Short Term goal: Utilizing psychosocial counselor, staff and physician to assist with identification of specific Stressors or current issues interfering with healing process. Setting desired goal for each stressor or current issue identified.;Long Term Goal: Stressors or current issues are controlled or eliminated.;Short Term goal: Identification and review with participant of any Quality of Life or Depression concerns found by scoring the questionnaire.;Long Term goal: The participant improves  quality of Life and PHQ9 Scores as seen by post scores and/or verbalization of changes             Quality of Life Scores:   Quality of Life - 09/19/20 1500       Quality of Life   Select Quality of Life      Quality of Life Scores   Health/Function Pre 21.8 %    Socioeconomic Pre 17.44 %    Psych/Spiritual Pre 24 %    Family Pre 25.8 %    GLOBAL Pre 21.8 %            Scores of 19 and below usually indicate a poorer quality of life in these areas.  A difference of  2-3 points is a clinically meaningful difference.  A difference of 2-3 points in the total score of the Quality of Life Index has been associated with significant improvement in overall quality of life, self-image, physical symptoms, and general health in studies assessing change in quality of life.  PHQ-9: Recent Review Flowsheet Data     Depression screen [facility] 2/9 12/21/2020 09/19/2020   Decreased Interest 0 1   Down, Depressed, Hopeless 1 1   PHQ - 2 Score 1 2   Altered sleeping 1 1   Tired, decreased energy 1 1   Change in appetite 1 1   Feeling bad or failure about yourself  1 0   Trouble concentrating 0 0   Moving slowly or fidgety/restless 0 0   Suicidal thoughts 0 0   PHQ-9 Score 5 5   Difficult doing work/chores Not difficult at all Not difficult at all      Interpretation of Total Score  Total Score Depression Severity:  1-4 = Minimal depression, 5-9 = Mild depression, 10-14 = Moderate depression, 15-19 = Moderately severe depression, 20-27 = Severe depression   Psychosocial Evaluation and Intervention:  Psychosocial Evaluation - 09/11/20 1202       Psychosocial Evaluation & Interventions   Interventions Encouraged to exercise with the program and follow exercise prescription;Relaxation education    Comments Phyllicia has no barriers to entry to the program. She has some anxiety about her chest "opening up if she pushes to hard". Spoke to her that we have never seen this happen here as  we  will start slow and work until she feels healed before increasing the her workloads.  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 She  has a good support system with her daughter and her 2 brothers. She is ready to get started and learn about  her heart disease, learn the exercise and nutriton steps to lead to a healthy lifestyle and continue to work on managing her diabetes. Evangelyn should do well in the program.    Expected Outcomes STG: Attends all scheduled sessions:exercise and education. Continues to verbalize any stress or fears   LTG: Delonda able to utilize what she has learned to maintain a healthy lifestyle and has been able to diminish her stress and fears about her heart disease .    Continue Psychosocial Services  Follow up required by staff             Psychosocial Re-Evaluation:  Psychosocial Re-Evaluation     [place] Name 10/30/20 1400 11/15/20 1438 12/21/20 1804 12/27/20 1804 01/15/21 1758     Psychosocial Re-Evaluation   Current issues with Current Stress Concerns;Current Sleep Concerns Current Stress Concerns;Current Sleep Concerns History of Depression;Current Anxiety/Panic History of Depression;Current Anxiety/Panic Current Anxiety/Panic   Comments Yarima is still stressed out some about her health.  She feels good about attending HT and the education.  She doesnt want to go back to work yet.  Staff suggested she talk to HR at her employer about disability.  She sleeps well sometimes - she does have a prescirption for sleep medication.  She will ask Dr about melatonin. Sabriyah's anxiety has been hightened these last few weeks as she has gone to her follow up doctor appointments and didn't receive the news she was hoping. Her EF has stayed the same at 30-35% which means they are talking about placing an  ICD and trialing new medications. They already placed a loop recorder and she continues ot have bouts of SVT. She is very anxious about her prognosis and feels like she has been given a death sentence. She wants to do anything she can to help her live a long life. Information was given by staff and by her cardiologist about ICD and heart failure. She found a new work from home job and starts training next week. She is hoping she can manage that schedule. Her sleep has taken a hit given all the stress, she has been scared to try the clonidine her MD gave her but may try 1/2 a pill since she is so tired from stressing and not sleeping. Her family is supportive, although she states one of her brothers can be a little much when it comes to giving her advice. She is too scared to seek out a personal relationship because she doesn't know what her prognosis is. Reviewed patient health questionnaire (PHQ-9) with patient for follow up. Previously, patients score indicated signs/symptoms of depression.  Reviewed to see if patient is improving symptom wise while in program.  Score stayed the same. She has some anxiety about her new ICD. Informed her about the machine and what it is used for. Patient was very anxious today regarding her ICD, heart failure and diabetes. RD to set up follow up appointment regarding diet. She is nervous about her lightheadedness; discussed medications, heart failure, and role of anxiety in this. Encouraged attendance at rehab and talk therapy - she is scheduled to see someone tuesday. Discussed relaxation techniques and breathing techniques as well as the importance of laughter. Miaya has been very nervous still about her heart situation. She was prescribed Xanax before, and she felt it helped but was stopped by a doctor. She does plan to ask her doctor again if she is appropriate for another oral medication to help with her anxiety, however, she is also nervous  something is going to interact  with her heart medications. She will talk to her doctor about that. and also seeing somebody for her mental health. We talked about using deep breathing and guided meditation on [email or web address] She was given another home exercise packet that also shows how to do the progressive muscular relaxation. She has had a lot of support and advice from staff members here on working with her anxiety. She knows when to call her doctor. She can count on her daughter for support at home as well.   Expected Outcomes Short:  follow up with Dr about sleep meds Long: develop good stress management and sleep habits Short: follow up with EP on 3/23 to discuss ICD placement and options, try taking medication to sleep. Long: develop and maintain stress management techniques while continuing to become educated on her diagnosis Short: Continue to work toward an improvement in [place] by attending [facility] regularly. Long: Continue to improve stress and depression coping skills by talking with staff and attending [facility] regularly and work toward a positive mental state. Short: Continue to work toward an improvement in [place] scores by attending [facility] regularly. Talk with counselor. Long: Continue to improve stress and depression coping skills by talking with staff and attending [facility] regularly and work toward a positive mental state. Short: Talk to doctor about medications Long: Work on personal goals for mental health, use exercise to help with stress management/relaxation  and positive attitude   Interventions -- Relaxation education;Encouraged to attend Cardiac Rehabilitation for the exercise;Stress management education Encouraged to attend Cardiac Rehabilitation for the exercise Encouraged to attend Cardiac Rehabilitation for the exercise Encouraged to attend Cardiac Rehabilitation for the exercise   Continue Psychosocial Services  -- Follow up required by staff Follow up required by staff Follow up required by staff  Follow up required by staff     Initial Review   Source of Stress Concerns -- -- -- Chronic Illness;Financial --    [place] Name 02/19/21 1751             Psychosocial Re-Evaluation   Current issues with Current Anxiety/Panic       Comments Jeslyn sees her Dr this week and will ask about something for anxiety.  She will also try practicing some brathing adn progressive relaxation.       Expected Outcomes Short: talk with Dr an try mindfulness meditation  Long:  exercise and practice breathing regularly                Psychosocial Discharge (Final Psychosocial Re-Evaluation):  Psychosocial Re-Evaluation - 02/19/21 1751       Psychosocial Re-Evaluation   Current issues with Current Anxiety/Panic    Comments Terica sees her Dr this week and will ask about something for anxiety.  She will also try practicing some brathing adn progressive relaxation.    Expected Outcomes Short: talk with Dr an try mindfulness meditation  Long:  exercise and practice breathing regularly             Vocational Rehabilitation: Provide vocational rehab assistance to qualifying candidates.   Vocational Rehab Evaluation & Intervention:  Vocational Rehab - 09/11/20 1153       Initial Vocational Rehab Evaluation & Intervention   Assessment shows need for Vocational Rehabilitation No             Education: Education Goals: Education classes will be provided on a variety of topics geared toward better understanding of heart health and risk factor modification. Participant will  state understanding/return demonstration of topics presented as noted by education test scores.  Learning Barriers/Preferences:  Learning Barriers/Preferences - 09/11/20 1152       Learning Barriers/Preferences   Learning Barriers None    Learning Preferences Video             General Cardiac Education Topics:  AED/CPR: - Group verbal and written instruction with the use of models to demonstrate the basic  use of the AED with the basic ABC's of resuscitation.   Anatomy and Cardiac Procedures: - Group verbal and visual presentation and models provide information about basic cardiac anatomy and function. Reviews the testing methods done to diagnose heart disease and the outcomes of the test results. Describes the treatment choices: Medical Management, Angioplasty, or Coronary Bypass Surgery for treating various heart conditions including Myocardial Infarction, Angina, Valve Disease, and Cardiac Arrhythmias.  Written material given at graduation.   Medication Safety: - Group verbal and visual instruction to review commonly prescribed medications for heart and lung disease. Reviews the medication, class of the drug, and side effects. Includes the steps to properly store meds and maintain the prescription regimen.  Written material given at graduation. Flowsheet Row Cardiac Rehab from 01/24/2021 in [facility] Cardiac and Pulmonary Rehab  Date 01/24/21  Educator [facility]  Instruction Review Code 1- Verbalizes Understanding       Intimacy: - Group verbal instruction through game format to discuss how heart and lung disease can affect sexual intimacy. Written material given at graduation..   Know Your Numbers and Heart Failure: - Group verbal and visual instruction to discuss disease risk factors for cardiac and pulmonary disease and treatment options.  Reviews associated critical values for Overweight/Obesity, Hypertension, Cholesterol, and Diabetes.  Discusses basics of heart failure: signs/symptoms and treatments.  Introduces Heart Failure Zone chart for action plan for heart failure.  Written material given at graduation.   Infection Prevention: - Provides verbal and written material to individual with discussion of infection control including proper hand washing and proper equipment cleaning during exercise session. Flowsheet Row Cardiac Rehab from 01/24/2021 in [facility] Cardiac and Pulmonary Rehab  Date  09/19/20  Educator AS  Instruction Review Code 1- Verbalizes Understanding       Falls Prevention: - Provides verbal and written material to individual with discussion of falls prevention and safety. Flowsheet Row Cardiac Rehab from 01/24/2021 in [facility] Cardiac and Pulmonary Rehab  Date 09/19/20  Educator AS  Instruction Review Code 1- Verbalizes Understanding       Other: -Provides group and verbal instruction on various topics (see comments)   Knowledge Questionnaire Score:  Knowledge Questionnaire Score - 09/19/20 1501       Knowledge Questionnaire Score   Pre Score 23/26 angina nutrition             Core Components/Risk Factors/Patient Goals at Admission:  Personal Goals and Risk Factors at Admission - 09/19/20 1459       Core Components/Risk Factors/Patient Goals on Admission    Weight Management Weight Loss;Yes    Intervention Weight Management/Obesity: Establish reasonable short term and long term weight goals.;Weight Management: Develop a combined nutrition and exercise program designed to reach desired caloric intake, while maintaining appropriate intake of nutrient and fiber, sodium and fats, and appropriate energy expenditure required for the weight goal.;Weight Management: Provide education and appropriate resources to help participant work on and attain dietary goals.    Admit Weight 186 lb (84.4 kg)    Goal Weight: Short Term 180 lb (81.6 kg)  Goal Weight: Long Term 160 lb (72.6 kg)    Expected Outcomes Short Term: Continue to assess and modify interventions until short term weight is achieved;Long Term: Adherence to nutrition and physical activity/exercise program aimed toward attainment of established weight goal;Weight Loss: Understanding of general recommendations for a balanced deficit meal plan, which promotes 1-2 lb weight loss per week and includes a negative energy balance of [phone] kcal/d    Diabetes Yes    Intervention Provide education about  signs/symptoms and action to take for hypo/hyperglycemia.;Provide education about proper nutrition, including hydration, and aerobic/resistive exercise prescription along with prescribed medications to achieve blood glucose in normal ranges: Fasting glucose 65-99 mg/dL    Expected Outcomes Short Term: Participant verbalizes understanding of the signs/symptoms and immediate care of hyper/hypoglycemia, proper foot care and importance of medication, aerobic/resistive exercise and nutrition plan for blood glucose control.;Long Term: Attainment of HbA1C < 7%.    Intervention Provide education on lifestyle modifcations including regular physical activity/exercise, weight management, moderate sodium restriction and increased consumption of fresh fruit, vegetables, and low fat dairy, alcohol moderation, and smoking cessation.;Monitor prescription use compliance.    Expected Outcomes Short Term: Continued assessment and intervention until BP is < 140/44mm HG in hypertensive participants. < 130/62mm HG in hypertensive participants with diabetes, heart failure or chronic kidney disease.;Long Term: Maintenance of blood pressure at goal levels.    Lipids Yes    Intervention Provide education and support for participant on nutrition & aerobic/resistive exercise along with prescribed medications to achieve LDL 70mg , HDL >40mg .    Expected Outcomes Short Term: Participant states understanding of desired cholesterol values and is compliant with medications prescribed. Participant is following exercise prescription and nutrition guidelines.;Long Term: Cholesterol controlled with medications as prescribed, with individualized exercise RX and with personalized nutrition plan. Value goals: LDL < 70mg , HDL > 40 mg.             Education:Diabetes - Individual verbal and written instruction to review signs/symptoms of diabetes, desired ranges of glucose level fasting, after meals and with exercise. Acknowledge that pre and  post exercise glucose checks will be done for 3 sessions at entry of program. [place] from 01/24/2021 in [facility] Cardiac and Pulmonary Rehab  Date 09/19/20  Educator AS  Instruction Review Code 1- Verbalizes Understanding       Core Components/Risk Factors/Patient Goals Review:   Goals and Risk Factor Review     [place] Name 10/30/20 1356 11/15/20 1429 12/21/20 1757 01/15/21 1748 02/19/21 1744     Core Components/Risk Factors/Patient Goals Review   Personal Goals Review Weight Management/Obesity;Hypertension;Lipids;Diabetes Weight Management/Obesity;Hypertension;Diabetes;Heart Failure Weight Management/Obesity;Diabetes;Hypertension Weight Management/Obesity;Diabetes;Hypertension Weight Management/Obesity;Diabetes;Hypertension   Review Jermisha is monitoring BP and BG at home.  Her normal FBG is around 90-95.  She also monitors BP at home .  [facility] was 110/62 at [facility].  She is taking all medications as directed. Raniah's BP is stable and is monitoring it while the cardiologist is trialing medications for her heart failure. Her diabetes is being managed well, so well that her MD has decreased her diabetic medication. She recently had a repeat echo and her EF was 30-35%, which was unchanged. Since it did not improve and she is still having runs of SVT they are talking with her about getting an ICD and has appointment with the electrophysiologist Ann-Marie wants to get down to 160 pounds. She has returned to rehab after ICD and is ready to continue. Her blood sugars have been 109 in the morning  fasting. She feels like she has her sugar in check. Her blood pressure has been stable and is checking it at home. Larri has been checking her blood sugars about 4x/ day. At fasting, it ranges between 120-130 which she states her doctor is OK with.  She admits when she eats sweets here and there, it shoots up to 200. BP at home she checks is between 213Y systolic/ under 86V diastolic. Dillie's weight has  been staying the same, but she does wish to lose weight. She plans to buy a scale so she can monitor better at home. She plans to move more and change her diet to more "diabetic friendly." Kahlia says her BG has been high.  She called her Dr today to ask about changing dose of medication.  She does have a scale to weigh herself.  She continues to work on [employer] diabetic friendly.   Expected Outcomes Short: continue to monitor vitals at home Long:  manage risk factors long term Short: attend EP appointment to discuss ICD on 3/23 Long: continue managing BP and Diabetes, work with MD on plan for HF Short: lose 5 pounds in the next 2 weeks. Long: reach a weight goal of 160 pounds. Short Get a scale and monitor weight more routinely Long: Continue to manage lifestyle risk factors Short:  continueto monitor sugar and weight Long:  manage risk factors and keep in optimal range    Row Name 02/19/21 1751             Core Components/Risk Factors/Patient Goals Review   Review Kylani says her BG has been high.  She called her Dr today to ask about changing dose of medication.  She does have a scale to weigh herself.  She continues to work on eating more diabetic friendly.  Her FBG is around 130.  Her Dr recommended not checking BG except fasting.                Core Components/Risk Factors/Patient Goals at Discharge (Final Review):   Goals and Risk Factor Review - 02/19/21 1751       Core Components/Risk Factors/Patient Goals Review   Review Serin says her BG has been high.  She called her Dr today to ask about changing dose of medication.  She does have a scale to weigh herself.  She continues to work on eating more diabetic friendly.  Her FBG is around 130.  Her Dr recommended not checking BG except fasting.             ITP Comments:  ITP Comments     Row Name 09/11/20 1222 09/19/20 1506 09/21/20 1458 10/04/20 0858 10/19/20 1458   ITP Comments Virtual orientation call completed  today. shehas an appointment on Date: 09/13/2020  for EP eval and gym Orientation.  Documentation of diagnosis can be found in [facility] Date: 08/02/2020. Completed 6MWT and gym orientation. Initial ITP created and sent for review to Dr. Emily Filbert, Medical Director. First full day of exercise!  Patient was oriented to gym and equipment including functions, settings, policies, and procedures.  Patient's individual exercise prescription and treatment plan were reviewed.  All starting workloads were established based on the results of the 6 minute walk test done at initial orientation visit.  The plan for exercise progression was also introduced and progression will be customized based on patient's performance and goals. 30 Day review completed. Medical Director ITP review done, changes made as directed, and signed approval by Medical Director.  New to program Mariabelen stopped by today to inform staff she had a loop recorder placed yesterday and she didn't feel comfortable exercising. She was nervous about it and everthing that is going on with her heart. Information and support given to her as she tries to figure everything out. She plans to return Monday as she really enjoys the program. She was encouraged to reach out to her doctor and support staff with further questions regarding the loop recorder and that more education on a heart healthy lifestyle will be given while in the program.    [place] Name 11/01/20 0620 11/29/20 0727 12/11/20 1609 12/14/20 1208 12/26/20 0823   ITP Comments 30 Day review completed. Medical Director ITP review done, changes made as directed, and signed approval by Medical Director. 30 Day review completed. Medical Director ITP review done, changes made as directed, and signed approval by Medical Director. Called to check on patient.  She had gotten new job. She did decided to get her ICD.  She missed time for training and lost her new job. She would like to return to rehab.  She plans to return  on Wednesday 12/13/20. Gennifer has not yet returned and has not attended since last review. Raychel has attended once since last review.    [place] Name 12/27/20 [phone] 01/11/21 1739 01/24/21 0640 02/21/21 0714     ITP Comments 30 Day review completed. Medical Director ITP review done, changes made as directed, and signed approval by Medical Director. Amber has not attended since last review 30 Day review completed. Medical Director ITP review done, changes made as directed, and signed approval by Medical Director.  3 visits in [place] 30 Day review completed. Medical Director ITP review done, changes made as directed, and signed approval by Medical Director.             Comments:

## 2021-02-21 NOTE — Progress Notes (Signed)
Daily Session Note  Patient Details  Name: Stacey Richardson MRN: [id number] Date of Birth: February 27, 1966 Referring Provider:   Flowsheet Row Cardiac Rehab from 09/19/2020 in [facility] Cardiac and Pulmonary Rehab  Referring Provider End       Encounter Date: 02/21/2021  Check In:  Session Check In - 02/21/21 1716       Check-In   Supervising physician immediately available to respond to emergencies See telemetry face sheet for immediately available ER MD    Location [facility]-Cardiac & Pulmonary Rehab    Staff Present Renita Papa, RN BSN;Joseph Lou Miner, [place], ASCM CEP, Exercise Physiologist;Melissa Caiola RDN, LDN    Virtual Visit No    Medication changes reported     No    Fall or balance concerns reported    No    Warm-up and Cool-down Performed on first and last piece of equipment    Resistance Training Performed Yes    VAD Patient? No    PAD/SET Patient? No      Pain Assessment   Currently in Pain? No/denies                Social History   Tobacco Use  Smoking Status Former   Pack years: 0.00  Smokeless Tobacco Never  Tobacco Comments   Quit 30 years ago    Goals Met:  Independence with exercise equipment Exercise tolerated well No report of cardiac concerns or symptoms Strength training completed today  Goals Unmet:  Not Applicable  Comments: Pt able to follow exercise prescription today without complaint.  Will continue to monitor for progression.    Dr. Emily Filbert is Medical Director for [place].  Dr. Ottie Glazier is Medical Director for [facility] Pulmonary Rehabilitation.

## 2021-02-28 ENCOUNTER — Ambulatory Visit: Payer: Self-pay | Admitting: Internal Medicine

## 2021-02-28 ENCOUNTER — Other Ambulatory Visit: Payer: Self-pay

## 2021-02-28 DIAGNOSIS — Z951 Presence of aortocoronary bypass graft: Secondary | ICD-10-CM

## 2021-02-28 NOTE — Progress Notes (Deleted)
Follow-up Outpatient Visit Date: 02/28/2021  Primary Care Provider: Theotis Burrow, MD [address] [place] [address]  Chief Complaint: ***  HPI:  Stacey Richardson is a 55 y.o. female with history of coronary artery disease with critical left main stenosis status post CABG (LIMA to LAD and RIMA to OM1), chronic HFrEF due to ischemic cardiomyopathy, strokes, hypertension, hyperlipidemia, type 2 diabetes mellitus, and obesity, who presents for follow-up of coronary artery disease, cardiomyopathy, and stroke.  She was last seen in our office in March by Laurann Montana, NP.  Spironolactone was started at that time due to persistently reduced LVEF and PSVT, she underwent SVT ablation and ICD implantation by Dr. Quentin Ore in late March.  She was feeling well at the time of her follow-up with Dr. Quentin Ore in early May.  --------------------------------------------------------------------------------------------------  Past Medical History:  Diagnosis Date   CAD (coronary artery disease)    CABG (07/2020; LIMA-LAD, RIMA-OM1)   CVA (cerebral vascular accident) ([place])    Diabetes mellitus    HFrEF (heart failure with reduced ejection fraction) ([place])    HLD (hyperlipidemia)    Hypertension    SVT (supraventricular tachycardia) ([place])    s/p ablation (11/2020)   Past Surgical History:  Procedure Laterality Date   CARDIAC CATHETERIZATION     CLIPPING OF ATRIAL APPENDAGE N/A 08/06/2020   Procedure: CLIPPING OF ATRIAL APPENDAGE USING ATRICURE CLIP SIZE 40MM;  Surgeon: Wonda Olds, MD;  Location: [place];  Service: Open Heart Surgery;  Laterality: N/A;   CORONARY ARTERY BYPASS GRAFT N/A 08/06/2020   Procedure: CORONARY ARTERY BYPASS GRAFTING (CABG) X2, USING BILATERAL INTERNAL MAMMARY ARTERIES;  Surgeon: Wonda Olds, MD;  Location: [place];  Service: Open Heart Surgery;  Laterality: N/A;   ICD IMPLANT N/A 12/05/2020   Procedure: ICD IMPLANT;  Surgeon: Vickie Epley, MD;   Location: [place] [facility];  Service: Cardiovascular;  Laterality: N/A;   KNEE SURGERY     RIGHT/LEFT HEART CATH AND CORONARY ANGIOGRAPHY Bilateral 08/02/2020   Procedure: RIGHT/LEFT HEART CATH AND CORONARY ANGIOGRAPHY;  Surgeon: [name], MD;  Location: [place] [facility];  Service: Cardiovascular;  Laterality: Bilateral;   SVT ABLATION N/A 12/05/2020   Procedure: SVT ABLATION;  Surgeon: Vickie Epley, MD;  Location: [place] [facility];  Service: Cardiovascular;  Laterality: N/A;   TEE WITHOUT CARDIOVERSION N/A 07/18/2020   Procedure: TRANSESOPHAGEAL ECHOCARDIOGRAM (TEE);  Surgeon: [name], MD;  Location: [facility];  Service: Cardiovascular;  Laterality: N/A;   TEE WITHOUT CARDIOVERSION N/A 08/06/2020   Procedure: TRANSESOPHAGEAL ECHOCARDIOGRAM (TEE);  Surgeon: Wonda Olds, MD;  Location: [place];  Service: Open Heart Surgery;  Laterality: N/A;    No outpatient medications have been marked as taking for the 02/28/21 encounter (Appointment) with Dawnita Molner, Harrell Gave, MD.    Allergies: Peanut butter flavor and Lisinopril  Social History   Tobacco Use   Smoking status: Former    Pack years: 0.00   Smokeless tobacco: Never   Tobacco comments:    Quit 30 years ago  Vaping Use   Vaping Use: Never used  Substance Use Topics   Alcohol use: Yes    Comment: social   Drug use: No    Family History  Problem Relation Age of Onset   Heart disease Mother        a. ?valve   Heart failure Brother    Hypertension Brother     Review of Systems: A 12-system review of systems was  performed and was negative except as noted in the HPI.  --------------------------------------------------------------------------------------------------  Physical Exam: LMP 11/21/2020   General:  NAD. Neck: No JVD or HJR. Lungs: Clear to auscultation bilaterally without wheezes or crackles. Heart: Regular rate and rhythm without murmurs, rubs, or gallops. Abdomen: Soft, nontender,  nondistended. Extremities: No lower extremity edema.  EKG:  ***  Lab Results  Component Value Date   WBC 7.1 01/21/2021   HGB 10.6 (L) 01/21/2021   HCT 35.8 (L) 01/21/2021   MCV 77.8 (L) 01/21/2021   PLT 294 01/21/2021    Lab Results  Component Value Date   NA 134 (L) 01/21/2021   K 3.6 01/21/2021   CL 102 01/21/2021   CO2 26 01/21/2021   BUN 16 01/21/2021   CREATININE 0.70 01/21/2021   GLUCOSE 240 (H) 01/21/2021   ALT 15 10/08/2020    Lab Results  Component Value Date   CHOL 113 12/11/2020   HDL 41 12/11/2020   LDLCALC 59 12/11/2020   TRIG 60 12/11/2020   CHOLHDL 2.8 12/11/2020    --------------------------------------------------------------------------------------------------  ASSESSMENT AND PLAN:   [name], MD 02/28/2021 6:41 AM

## 2021-02-28 NOTE — Progress Notes (Signed)
Daily Session Note  Patient Details  Name: ERSA DELANEY MRN: [id number] Date of Birth: [birth year] Referring Provider:   Flowsheet Row Cardiac Rehab from 09/19/2020 in [facility] Cardiac and Pulmonary Rehab  Referring Provider End       Encounter Date: 02/28/2021  Check In:  Session Check In - 02/28/21 1718       Check-In   Supervising physician immediately available to respond to emergencies See telemetry face sheet for immediately available ER MD    Location [facility]-Cardiac & Pulmonary Rehab    Staff Present Birdie Sons, MPA, RN;Melissa Caiola RDN, LDN;Joseph Lou Miner, MS, ASCM CEP, Exercise Physiologist    Virtual Visit No    Medication changes reported     No    Fall or balance concerns reported    No    Warm-up and Cool-down Performed on first and last piece of equipment    Resistance Training Performed Yes    VAD Patient? No    PAD/SET Patient? No      Pain Assessment   Currently in Pain? No/denies                Social History   Tobacco Use  Smoking Status Former   Pack years: 0.00  Smokeless Tobacco Never  Tobacco Comments   Quit 30 years ago    Goals Met:  Independence with exercise equipment Exercise tolerated well No report of cardiac concerns or symptoms Strength training completed today  Goals Unmet:  Not Applicable  Comments: Pt able to follow exercise prescription today without complaint.  Will continue to monitor for progression.    Dr. Emily Filbert is Medical Director for [place].  Dr. Ottie Glazier is Medical Director for [facility] Pulmonary Rehabilitation.

## 2021-03-01 ENCOUNTER — Ambulatory Visit: Payer: Self-pay | Admitting: Psychologist

## 2021-03-01 ENCOUNTER — Other Ambulatory Visit: Payer: Self-pay | Admitting: Internal Medicine

## 2021-03-01 ENCOUNTER — Encounter: Payer: Medicaid Other | Admitting: *Deleted

## 2021-03-01 ENCOUNTER — Other Ambulatory Visit: Payer: Self-pay | Admitting: Family

## 2021-03-01 ENCOUNTER — Encounter: Payer: Self-pay | Admitting: Internal Medicine

## 2021-03-01 VITALS — Ht 66.5 in | Wt 191.8 lb

## 2021-03-01 DIAGNOSIS — Z951 Presence of aortocoronary bypass graft: Secondary | ICD-10-CM

## 2021-03-01 DIAGNOSIS — I10 Essential (primary) hypertension: Secondary | ICD-10-CM

## 2021-03-01 DIAGNOSIS — I5022 Chronic systolic (congestive) heart failure: Secondary | ICD-10-CM

## 2021-03-01 NOTE — Progress Notes (Signed)
Daily Session Note  Patient Details  Name: Stacey Richardson MRN: [id number] Date of Birth: [birth year] Referring Provider:   Flowsheet Row Cardiac Rehab from 09/19/2020 in [facility] Cardiac and Pulmonary Rehab  Referring Provider End       Encounter Date: 03/01/2021  Check In:  Session Check In - 03/01/21 1731       Check-In   Supervising physician immediately available to respond to emergencies See telemetry face sheet for immediately available ER MD    Location [facility]-Cardiac & Pulmonary Rehab    Staff Present Renita Papa, RN BSN;Joseph Foy [place], [place], ACSM CEP, Exercise Physiologist    Virtual Visit No    Medication changes reported     No    Warm-up and Cool-down Performed on first and last piece of equipment    Resistance Training Performed Yes    VAD Patient? No    PAD/SET Patient? No      Pain Assessment   Currently in Pain? No/denies                Social History   Tobacco Use  Smoking Status Former   Pack years: 0.00  Smokeless Tobacco Never  Tobacco Comments   Quit 30 years ago    Goals Met:  Independence with exercise equipment Exercise tolerated well No report of cardiac concerns or symptoms Strength training completed today  Goals Unmet:  Not Applicable  Comments: Pt able to follow exercise prescription today without complaint.  Will continue to monitor for progression.    Dr. Emily Filbert is Medical Director for [place].  Dr. Ottie Glazier is Medical Director for [facility] Pulmonary Rehabilitation.

## 2021-03-04 ENCOUNTER — Other Ambulatory Visit: Payer: Self-pay | Admitting: Physician Assistant

## 2021-03-05 ENCOUNTER — Other Ambulatory Visit: Payer: Self-pay

## 2021-03-06 ENCOUNTER — Telehealth: Payer: Self-pay

## 2021-03-06 DIAGNOSIS — Z951 Presence of aortocoronary bypass graft: Secondary | ICD-10-CM

## 2021-03-06 NOTE — Progress Notes (Signed)
Discharge Progress Report  Patient Details  Name: Stacey Richardson MRN: [id number] Date of Birth: 01/22/1966 Referring Provider:   Flowsheet Row Cardiac Rehab from 09/19/2020 in [facility] Cardiac and Pulmonary Rehab  Referring Provider End        Number of Visits: 27  Reason for Discharge:  Early Exit:  Insurance/ Lack of Attendance   Diagnosis:  S/P CABG x 2  ADL UCSD:   Initial Exercise Prescription:  Initial Exercise Prescription - 09/19/20 1400       Date of Initial Exercise RX and Referring Provider   Date 09/19/20    Referring Provider End      Treadmill   MPH 2.6    Grade 2.5    Minutes 15    METs 3.9      Recumbant Bike   Level 2    RPM 60    Watts 50    Minutes 15    METs 4      NuStep   Level 3    SPM 80    Minutes 15    METs 4      REL-XR   Level 2    Speed 50    Minutes 15    METs 4      T5 Nustep   Level 2    SPM 80    Minutes 15    METs 4      Prescription Details   Frequency (times per week) 3    Duration Progress to 30 minutes of continuous aerobic without signs/symptoms of physical distress      Intensity   THRR 40-80% of Max Heartrate 121-152    Ratings of Perceived Exertion 11-13    Perceived Dyspnea 0-4      Resistance Training   Training Prescription Yes    Weight 3 lb    Reps 10-15             Discharge Exercise Prescription (Final Exercise Prescription Changes):  Exercise Prescription Changes - 02/22/21 0900       Response to Exercise   Blood Pressure (Admit) 112/60    Blood Pressure (Exercise) 140/68    Blood Pressure (Exit) 102/58    Heart Rate (Admit) 86 bpm    Heart Rate (Exercise) 125 bpm    Heart Rate (Exit) 84 bpm    Rating of Perceived Exertion (Exercise) 13    Symptoms none    Duration Continue with 30 min of aerobic exercise without signs/symptoms of physical distress.    Intensity THRR unchanged      Progression   Progression Continue to progress workloads to maintain intensity without  signs/symptoms of physical distress.    Average METs 3.34      Resistance Training   Training Prescription Yes    Weight 4 lb    Reps 10-15      Interval Training   Interval Training No      Treadmill   MPH 2.3    Grade 3    Minutes 15    METs 3.71      Recumbant Bike   Level 2    Watts 20    Minutes 15    METs 2.74      Home Exercise Plan   Plans to continue exercise at Home (comment)   treadmill, gym   Frequency Add 2 additional days to program exercise sessions.    Initial Home Exercises Provided 01/04/21  Functional Capacity:     6 Minute Walk     Row Name 09/19/20 1453 02/28/21 1742       6 Minute Walk   Phase Initial Discharge    Distance 1365 feet 1670 feet    Distance % Change -- 22.3 %    Distance Feet Change -- 305 ft    Walk Time 6 minutes 6 minutes    # of Rest Breaks 0 0    MPH 2.58 3.16    METS 4.5 4.97    RPE 11 13    Perceived Dyspnea  2 0    VO2 Peak 15.77 17.4    Symptoms No Yes (comment)    Comments -- Felt tired at the end    Resting HR 91 bpm 80 bpm    Resting BP 110/66 140/70    Resting Oxygen Saturation  99 % --    Exercise Oxygen Saturation  during 6 min walk 98 % --    Max Ex. HR 141 bpm 122 bpm    Max Ex. BP 154/82 176/68    2 Minute Post BP 116/68 124/70              Nutrition & Weight - Outcomes:  Pre Biometrics - 09/19/20 1459       Pre Biometrics   Height 5' 6.5" (1.689 m)    Weight 186 lb (84.4 kg)    BMI (Calculated) 29.57    Single Leg Stand 19.26 seconds             Post Biometrics - 03/05/21 0926        Post  Biometrics   Height 5' 6.5" (1.689 m)    Weight 191 lb 12.8 oz (87 kg)    BMI (Calculated) 30.5             Nutrition:  Nutrition Therapy & Goals - 10/30/20 1439       Nutrition Therapy   Diet Heart healhty, low Na, diabetes friendly    Drug/Food Interactions Statins/Certain Fruits    Protein (specify units) 65g    Fiber 25 grams    Whole Grain Foods 3  servings    Saturated Fats 12 max. grams    Fruits and Vegetables 8 servings/day    Sodium 1.5 grams      Personal Nutrition Goals   Nutrition Goal ST: practice counting carbohydrates, use MyPlate structure and count carbohydrates (3-4 servings for meals, 1-2 for snacks) LT: Be independent managing diabetes    Comments 2 meals - lunch and dinner. Sometimes will have boiled egg and toast (white wheat) - she likes whole wheat bread. L: tuna or cheese toast (whole wheat) D: take out - tries to get baked chicken, mashed poatoes, and sweet potatoes, brussells sprouts. Discussed heart healthy eating and diabetes friendly eating. Discussed which foods were CHO servings and how to count CHOs servings.      Intervention Plan   Intervention Prescribe, educate and counsel regarding individualized specific dietary modifications aiming towards targeted core components such as weight, hypertension, lipid management, diabetes, heart failure and other comorbidities.;Nutrition handout(s) given to patient.    Expected Outcomes Short Term Goal: Understand basic principles of dietary content, such as calories, fat, sodium, cholesterol and nutrients.;Short Term Goal: A plan has been developed with personal nutrition goals set during dietitian appointment.;Long Term Goal: Adherence to prescribed nutrition plan.                Goals reviewed with patient; copy  given to patient.

## 2021-03-06 NOTE — Progress Notes (Signed)
Cardiac Individual Treatment Plan  Patient Details  Name: Stacey Richardson MRN: [id number] Date of Birth: Apr 29, 1966 Referring Provider:   Flowsheet Row Cardiac Rehab from 09/19/2020 in [facility] Cardiac and Pulmonary Rehab  Referring Provider End       Initial Encounter Date:  Flowsheet Row Cardiac Rehab from 09/19/2020 in [facility] Cardiac and Pulmonary Rehab  Date 09/19/20       Visit Diagnosis: S/P CABG x 2  Patient's Home Medications on Admission:  Current Outpatient Medications:    acetaminophen (TYLENOL) 500 MG tablet, Take 500 mg by mouth every 6 (six) hours as needed for moderate pain or headache., Disp: , Rfl:    alum & mag hydroxide-simeth (MAALOX ADVANCED MAX ST) 400-400-40 MG/5ML suspension, Take 15 mLs by mouth every 6 (six) hours as needed for indigestion., Disp: 355 mL, Rfl: 0   aspirin EC 81 MG tablet, Take 81 mg by mouth daily. , Disp: , Rfl:    atorvastatin (LIPITOR) 80 MG tablet, Take 80 mg by mouth at bedtime., Disp: , Rfl:    carvedilol (COREG) 12.5 MG tablet, TAKE 1 TABLET BY MOUTH 2 TIMES DAILY., Disp: 60 tablet, Rfl: 1   losartan (COZAAR) 25 MG tablet, TAKE 1 TABLET (25 MG TOTAL) BY MOUTH DAILY., Disp: 30 tablet, Rfl: 1   metFORMIN (GLUCOPHAGE-XR) 500 MG 24 hr tablet, Take 500 mg by mouth daily with breakfast., Disp: , Rfl:    spironolactone (ALDACTONE) 25 MG tablet, TAKE 1/2 TABLET BY MOUTH EVERY DAY, Disp: 15 tablet, Rfl: 1   tetrahydrozoline 0.05 % ophthalmic solution, Place 1-2 drops into both eyes daily as needed (irritated eyes.). visine, Disp: , Rfl:   Past Medical History: Past Medical History:  Diagnosis Date   CAD (coronary artery disease)    CABG (07/2020; LIMA-LAD, RIMA-OM1)   CVA (cerebral vascular accident) ([place])    Diabetes mellitus    HFrEF (heart failure with reduced ejection fraction) ([place])    HLD (hyperlipidemia)    Hypertension    SVT (supraventricular tachycardia) ([place])    s/p ablation (11/2020)    Tobacco Use: Social History   Tobacco  Use  Smoking Status Former   Pack years: 0.00  Smokeless Tobacco Never  Tobacco Comments   Quit 30 years ago    Labs: Recent Review Scientist, physiological     Labs for ITP Cardiac and Pulmonary Rehab Latest Ref Rng & Units 08/06/2020 08/06/2020 08/07/2020 08/08/2020 12/11/2020   Cholestrol 100 - 199 mg/dL - - - - 113   LDLCALC 0 - 99 mg/dL - - - - 59   HDL >39 mg/dL - - - - 41   Trlycerides 0 - 149 mg/dL - - - - 60   Hemoglobin A1c 4.8 - 5.6 % - - - - 6.5(H)   PHART 7.350 - 7.450 7.328(L) 7.321(L) - - -   PCO2ART 32.0 - 48.0 mmHg 37.0 38.5 - - -   HCO3 20.0 - 28.0 mmol/L 19.4(L) 19.9(L) - - -   TCO2 22 - 32 mmol/L 20(L) 21(L) - - -   ACIDBASEDEF 0.0 - 2.0 mmol/L 6.0(H) 6.0(H) - - -   O2SAT % 99.0 99.0 74.6 62.1 -        Exercise Target Goals: Exercise Program Goal: Individual exercise prescription set using results from initial 6 min walk test and THRR while considering  patient's activity barriers and safety.   Exercise Prescription Goal: Initial exercise prescription builds to 30-45 minutes a day of aerobic activity, 2-3 days per week.  Home  exercise guidelines will be given to patient during program as part of exercise prescription that the participant will acknowledge.   Education: Aerobic Exercise: - Group verbal and visual presentation on the components of exercise prescription. Introduces F.I.T.T principle from ACSM for exercise prescriptions.  Reviews F.I.T.T. principles of aerobic exercise including progression. Written material given at graduation.   Education: Resistance Exercise: - Group verbal and visual presentation on the components of exercise prescription. Introduces F.I.T.T principle from ACSM for exercise prescriptions  Reviews F.I.T.T. principles of resistance exercise including progression. Written material given at graduation.    Education: Exercise & Equipment Safety: - Individual verbal instruction and demonstration of equipment use and safety with use of  the equipment. Flowsheet Row Cardiac Rehab from 01/24/2021 in [facility] Cardiac and Pulmonary Rehab  Date 09/19/20  Educator AS  Instruction Review Code 1- Verbalizes Understanding       Education: Exercise Physiology & General Exercise Guidelines: - Group verbal and written instruction with models to review the exercise physiology of the cardiovascular system and associated critical values. Provides general exercise guidelines with specific guidelines to those with heart or lung disease.    Education: Flexibility, Balance, Mind/Body Relaxation: - Group verbal and visual presentation with interactive activity on the components of exercise prescription. Introduces F.I.T.T principle from ACSM for exercise prescriptions. Reviews F.I.T.T. principles of flexibility and balance exercise training including progression. Also discusses the mind body connection.  Reviews various relaxation techniques to help reduce and manage stress (i.e. Deep breathing, progressive muscle relaxation, and visualization). Balance handout provided to take home. Written material given at graduation. Flowsheet Row Cardiac Rehab from 01/24/2021 in [facility] Cardiac and Pulmonary Rehab  Date 11/08/20  Educator AS  Instruction Review Code 1- Verbalizes Understanding       Activity Barriers & Risk Stratification:  Activity Barriers & Cardiac Risk Stratification - 09/11/20 1142       Activity Barriers & Cardiac Risk Stratification   Activity Barriers None    Cardiac Risk Stratification Moderate             6 Minute Walk:  6 Minute Walk     Row Name 09/19/20 1453 02/28/21 1742       6 Minute Walk   Phase Initial Discharge    Distance 1365 feet 1670 feet    Distance % Change -- 22.3 %    Distance Feet Change -- 305 ft    Walk Time 6 minutes 6 minutes    # of Rest Breaks 0 0    MPH 2.58 3.16    METS 4.5 4.97    RPE 11 13    Perceived Dyspnea  2 0    VO2 Peak 15.77 17.4    Symptoms No Yes (comment)    Comments  -- Felt tired at the end    Resting HR 91 bpm 80 bpm    Resting BP 110/66 140/70    Resting Oxygen Saturation  99 % --    Exercise Oxygen Saturation  during 6 min walk 98 % --    Max Ex. HR 141 bpm 122 bpm    Max Ex. BP 154/82 176/68    2 Minute Post BP 116/68 124/70             Oxygen Initial Assessment:   Oxygen Re-Evaluation:   Oxygen Discharge (Final Oxygen Re-Evaluation):   Initial Exercise Prescription:  Initial Exercise Prescription - 09/19/20 1400       Date of Initial Exercise RX and Referring Provider  Date 09/19/20    Referring Provider End      Treadmill   MPH 2.6    Grade 2.5    Minutes 15    METs 3.9      Recumbant Bike   Level 2    RPM 60    Watts 50    Minutes 15    METs 4      NuStep   Level 3    SPM 80    Minutes 15    METs 4      REL-XR   Level 2    Speed 50    Minutes 15    METs 4      T5 Nustep   Level 2    SPM 80    Minutes 15    METs 4      Prescription Details   Frequency (times per week) 3    Duration Progress to 30 minutes of continuous aerobic without signs/symptoms of physical distress      Intensity   THRR 40-80% of Max Heartrate 121-152    Ratings of Perceived Exertion 11-13    Perceived Dyspnea 0-4      Resistance Training   Training Prescription Yes    Weight 3 lb    Reps 10-15             Perform Capillary Blood Glucose checks as needed.  Exercise Prescription Changes:   Exercise Prescription Changes     Row Name 09/19/20 1400 10/04/20 1500 11/01/20 1100 11/15/20 1200 12/28/20 1500     Response to Exercise   Blood Pressure (Admit) 110/66 122/70 110/62 126/74 126/68   Blood Pressure (Exercise) 154/82 152/66 172/70 152/62 156/72   Blood Pressure (Exit) 116/68 110/66 110/60 106/60 118/64   Heart Rate (Admit) 91 bpm 100 bpm 109 bpm 104 bpm 87 bpm   Heart Rate (Exercise) 141 bpm 134 bpm 128 bpm 147 bpm 105 bpm   Heart Rate (Exit) 90 bpm 109 bpm 102 bpm 88 bpm 91 bpm   Oxygen Saturation  (Admit) 99 % -- -- -- --   Oxygen Saturation (Exercise) 98 % -- -- -- --   Rating of Perceived Exertion (Exercise) 11 14 13 16 11    Perceived Dyspnea (Exercise) 2 -- -- -- --   Symptoms none none none none none   Duration -- Continue with 30 min of aerobic exercise without signs/symptoms of physical distress. Continue with 30 min of aerobic exercise without signs/symptoms of physical distress. Continue with 30 min of aerobic exercise without signs/symptoms of physical distress. Continue with 30 min of aerobic exercise without signs/symptoms of physical distress.   Intensity -- THRR unchanged THRR unchanged THRR unchanged THRR unchanged     Progression   Progression -- Continue to progress workloads to maintain intensity without signs/symptoms of physical distress. Continue to progress workloads to maintain intensity without signs/symptoms of physical distress. Continue to progress workloads to maintain intensity without signs/symptoms of physical distress. Continue to progress workloads to maintain intensity without signs/symptoms of physical distress.   Average METs -- 2.85 2.45 3.26 2.8     Resistance Training   Training Prescription -- Yes Yes Yes Yes   Weight -- 3 lb 3 lb 4 lb 4 lb   Reps -- 10-15 10-15 10-15 10-15     Interval Training   Interval Training -- No No No No     Treadmill   MPH -- 2.6 2.4 2.6 2.6   Grade -- 2.5 2 2  2   Minutes -- 15 15 15 15    METs -- 3.89 3.5 3.89 3.71     NuStep   Level -- -- -- -- 3   SPM -- -- -- -- 80   Minutes -- -- -- -- 15   METs -- -- -- -- 2     Recumbant Elliptical   Level -- -- 1 -- --   Minutes -- -- 15 -- --   METs -- -- 1.4 -- --     REL-XR   Level -- -- -- -- 3   Speed -- -- -- -- 50   Minutes -- -- -- -- 15   METs -- -- -- -- 2.7     T5 Nustep   Level -- 2 -- -- --   Minutes -- 15 -- -- --   METs -- 1.8 -- -- --    Row Name 01/25/21 1500 02/22/21 0900           Response to Exercise   Blood Pressure (Admit) 110/60  112/60      Blood Pressure (Exercise) 128/70 140/68      Blood Pressure (Exit) 118/68 102/58      Heart Rate (Admit) 83 bpm 86 bpm      Heart Rate (Exercise) 126 bpm 125 bpm      Heart Rate (Exit) 97 bpm 84 bpm      Rating of Perceived Exertion (Exercise) 13 13      Symptoms none none      Duration Continue with 30 min of aerobic exercise without signs/symptoms of physical distress. Continue with 30 min of aerobic exercise without signs/symptoms of physical distress.      Intensity THRR unchanged THRR unchanged             Progression      Progression Continue to progress workloads to maintain intensity without signs/symptoms of physical distress. Continue to progress workloads to maintain intensity without signs/symptoms of physical distress.      Average METs 2.69 3.34             Resistance Training      Training Prescription Yes Yes      Weight 4 lb 4 lb      Reps 10-15 10-15             Interval Training      Interval Training No No             Treadmill      MPH 2.5 2.3      Grade 0.5 3      Minutes 15 15      METs 3.09 3.71             Recumbant Bike      Level -- 2      Watts -- 20      Minutes -- 15      METs -- 2.74             NuStep      Level 3 --      Minutes 15 --      METs 2.3 --             Home Exercise Plan      Plans to continue exercise at Home (comment)  treadmill, gym Home (comment)  treadmill, gym      Frequency Add 2 additional days to program exercise sessions. Add 2 additional days to program exercise sessions.  Initial Home Exercises Provided 01/04/21 01/04/21              Exercise Comments:   Exercise Comments     Row Name 09/21/20 1458           Exercise Comments First full day of exercise!  Patient was oriented to gym and equipment including functions, settings, policies, and procedures.  Patient's individual exercise prescription and treatment plan were reviewed.  All starting workloads were established based on the  results of the 6 minute walk test done at initial orientation visit.  The plan for exercise progression was also introduced and progression will be customized based on patient's performance and goals.                Exercise Goals and Review:   Exercise Goals     Row Name 09/19/20 1458             Exercise Goals   Increase Physical Activity Yes       Intervention Provide advice, education, support and counseling about physical activity/exercise needs.;Develop an individualized exercise prescription for aerobic and resistive training based on initial evaluation findings, risk stratification, comorbidities and participant's personal goals.       Expected Outcomes Short Term: Attend rehab on a regular basis to increase amount of physical activity.;Long Term: Add in home exercise to make exercise part of routine and to increase amount of physical activity.;Long Term: Exercising regularly at least 3-5 days a week.       Increase Strength and Stamina Yes       Intervention Provide advice, education, support and counseling about physical activity/exercise needs.;Develop an individualized exercise prescription for aerobic and resistive training based on initial evaluation findings, risk stratification, comorbidities and participant's personal goals.       Expected Outcomes Short Term: Increase workloads from initial exercise prescription for resistance, speed, and METs.;Short Term: Perform resistance training exercises routinely during rehab and add in resistance training at home;Long Term: Improve cardiorespiratory fitness, muscular endurance and strength as measured by increased METs and functional capacity (6MWT)       Able to understand and use rate of perceived exertion (RPE) scale Yes       Intervention Provide education and explanation on how to use RPE scale       Expected Outcomes Short Term: Able to use RPE daily in rehab to express subjective intensity level;Long Term:  Able to use RPE  to guide intensity level when exercising independently       Able to understand and use Dyspnea scale Yes       Intervention Provide education and explanation on how to use Dyspnea scale       Expected Outcomes Short Term: Able to use Dyspnea scale daily in rehab to express subjective sense of shortness of breath during exertion;Long Term: Able to use Dyspnea scale to guide intensity level when exercising independently       Knowledge and understanding of Target Heart Rate Range (THRR) Yes       Intervention Provide education and explanation of THRR including how the numbers were predicted and where they are located for reference       Expected Outcomes Short Term: Able to state/look up THRR;Short Term: Able to use daily as guideline for intensity in rehab;Long Term: Able to use THRR to govern intensity when exercising independently       Able to check pulse independently Yes       Intervention Provide education and  demonstration on how to check pulse in carotid and radial arteries.;Review the importance of being able to check your own pulse for safety during independent exercise       Expected Outcomes Short Term: Able to explain why pulse checking is important during independent exercise;Long Term: Able to check pulse independently and accurately       Understanding of Exercise Prescription Yes       Intervention Provide education, explanation, and written materials on patient's individual exercise prescription       Expected Outcomes Short Term: Able to explain program exercise prescription;Long Term: Able to explain home exercise prescription to exercise independently                Exercise Goals Re-Evaluation :  Exercise Goals Re-Evaluation     Row Name 09/21/20 1458 10/04/20 1526 10/30/20 1409 11/01/20 1140 11/15/20 1211     Exercise Goal Re-Evaluation   Exercise Goals Review Able to understand and use rate of perceived exertion (RPE) scale;Able to understand and use Dyspnea  scale;Knowledge and understanding of Target Heart Rate Range (THRR);Understanding of Exercise Prescription Increase Physical Activity;Increase Strength and Stamina;Understanding of Exercise Prescription Increase Physical Activity;Increase Strength and Stamina Increase Physical Activity;Increase Strength and Stamina;Understanding of Exercise Prescription Increase Physical Activity;Increase Strength and Stamina   Comments Reviewed RPE and dyspnea scales, THR and program prescription with pt today.  Pt voiced understanding and was given a copy of goals to take home. Stacey Richardson is off to a good start to rehab.  She has completed her first two full days of rehab.  We will continue to monitor her progress. Stacey Richardson has a TM at home.  Staff have not reviewed home exercise yet. Stacey Richardson is doing well in rehab.  Her attendance has gotten better recently.  We will review home exercise guideline with her.  She is up to 2.6 mph on the treadmill.  We will continue to monitor her progress. Stacey Richardson is progressing well. She has incresaed speed on TM and weight to 4 lb for strength work.  We will continue to monitor progress.   Expected Outcomes Short: Use RPE daily to regulate intensity. Long: Follow program prescription in THR. Short: Continue to attend regularly Long: Continue to follow program prescription Short:  review home exercise with staff Long:  improve stamina Short:  review home exercise with staff Long:  improve stamina Short: contiue to attend consistently Long: improve overall stamina    Row Name 11/29/20 1150 12/21/20 1748 12/28/20 1544 01/04/21 1749 01/15/21 1734     Exercise Goal Re-Evaluation   Exercise Goals Review Understanding of Exercise Prescription Increase Physical Activity;Increase Strength and Stamina Increase Physical Activity;Increase Strength and Stamina Increase Physical Activity;Increase Strength and Stamina;Able to understand and use rate of perceived exertion (RPE) scale;Knowledge and  understanding of Target Heart Rate Range (THRR);Able to check pulse independently Increase Physical Activity;Increase Strength and Stamina;Able to understand and use rate of perceived exertion (RPE) scale;Knowledge and understanding of Target Heart Rate Range (THRR);Able to check pulse independently   Comments Stacey Richardson has started a new job and been out since last review.  She is supposed to return this week at a new class time. Stacey Richardson has been on her treadmill at home little by little. She has returned to rehab to continue exercise after having an ICD placement. Stacey Richardson is starting to return back to rehab slowly after her ICD placement. Stacey Richardson has anxiety and is not confident with her exercise just yet, we have talked with her and gave her  education on stress management and anxiety. Stacey Richardson is slowing building up her workloads here after she gets back into her routine. Pt able to follow exercise prescription today without complaint.  Will continue to monitor for progression.  Reviewed home exercise with pt today.  Pt plans to walk and join a gym for exercise.  Reviewed THR, pulse, RPE, sign and symptoms, pulse oximetery and when to call 911 or MD.  Also discussed weather considerations and indoor options.  Pt voiced understanding. Stacey Richardson has been walking on her treadmill at home as well as a track oudoors. She walks with her daughters sometimes which helps time go by. She only does 15 minutes of exercise, breaks, and then does another 15 minutes as she is nervous to do the full 30 minutes as she is afraid something will happen. We talked about the confidence piece, and stressed importance of monitoring herself, vitals, and symptoms at home. She is aware of what to look out for and when to call 911. She also needs to call her doctors if there are any concerns   Expected Outcomes Short: contiue to attend consistently Long: improve overall stamina Short: continue rehab to regain strength. Long: maintain exercise  post [facility]. Short: Build up confidence with exercise Long: Exercise at home with appropriate exercise prescription Short: monitor HR when exercising at home Long:  maintain execise on her own Short: Continue to monitor vitals at home during exercise Long: Exercise at appropriate prescription at home    [place] Name 01/25/21 1552 02/19/21 1757 02/22/21 0952         Exercise Goal Re-Evaluation   Exercise Goals Review Increase Physical Activity;Increase Strength and Stamina;Understanding of Exercise Prescription Increase Physical Activity;Increase Strength and Stamina Increase Physical Activity;Increase Strength and Stamina     Comments Stacey Richardson is doing well in rehab when she attends.  She has attended twice since last update.  We will continue to encourage improved attendance.  Today, she called out with car trouble. We will continue to monitor her progress. -- Stacey Richardson has attended three tims in June so far.  She does well when she attends and works in [employer] range.     Expected Outcomes Short: Improved attendance Long: Continue to improve stamina. Short: Improved attendance Long: Continue to improve stamina. Short:  attend consistently Long: build overall stamina              Discharge Exercise Prescription (Final Exercise Prescription Changes):  Exercise Prescription Changes - 02/22/21 0900       Response to Exercise   Blood Pressure (Admit) 112/60    Blood Pressure (Exercise) 140/68    Blood Pressure (Exit) 102/58    Heart Rate (Admit) 86 bpm    Heart Rate (Exercise) 125 bpm    Heart Rate (Exit) 84 bpm    Rating of Perceived Exertion (Exercise) 13    Symptoms none    Duration Continue with 30 min of aerobic exercise without signs/symptoms of physical distress.    Intensity THRR unchanged      Progression   Progression Continue to progress workloads to maintain intensity without signs/symptoms of physical distress.    Average METs 3.34      Resistance Training   Training  Prescription Yes    Weight 4 lb    Reps 10-15      Interval Training   Interval Training No      Treadmill   MPH 2.3    Grade 3    Minutes 15  METs 3.71      Recumbant Bike   Level 2    Watts 20    Minutes 15    METs 2.74      Home Exercise Plan   Plans to continue exercise at Home (comment)   treadmill, gym   Frequency Add 2 additional days to program exercise sessions.    Initial Home Exercises Provided 01/04/21             Nutrition:  Target Goals: Understanding of nutrition guidelines, daily intake of sodium 1500mg , cholesterol 200mg , calories 30% from fat and 7% or less from saturated fats, daily to have 5 or more servings of fruits and vegetables.  Education: All About Nutrition: -Group instruction provided by verbal, written material, interactive activities, discussions, models, and posters to present general guidelines for heart healthy nutrition including fat, fiber, MyPlate, the role of sodium in heart healthy nutrition, utilization of the nutrition label, and utilization of this knowledge for meal planning. Follow up email sent as well. Written material given at graduation. Flowsheet Row Cardiac Rehab from 01/24/2021 in [facility] Cardiac and Pulmonary Rehab  Date 11/15/20  Educator [facility]  Instruction Review Code 1- Verbalizes Understanding       Biometrics:  Pre Biometrics - 09/19/20 1459       Pre Biometrics   Height 5' 6.5" (1.689 m)    Weight 186 lb (84.4 kg)    BMI (Calculated) 29.57    Single Leg Stand 19.26 seconds             Post Biometrics - 03/05/21 0926        Post  Biometrics   Height 5' 6.5" (1.689 m)    Weight 191 lb 12.8 oz (87 kg)    BMI (Calculated) 30.5             Nutrition Therapy Plan and Nutrition Goals:  Nutrition Therapy & Goals - 10/30/20 1439       Nutrition Therapy   Diet Heart healhty, low Na, diabetes friendly    Drug/Food Interactions Statins/Certain Fruits    Protein (specify units) 65g    Fiber 25  grams    Whole Grain Foods 3 servings    Saturated Fats 12 max. grams    Fruits and Vegetables 8 servings/day    Sodium 1.5 grams      Personal Nutrition Goals   Nutrition Goal ST: practice counting carbohydrates, use MyPlate structure and count carbohydrates (3-4 servings for meals, 1-2 for snacks) LT: Be independent managing diabetes    Comments 2 meals - lunch and dinner. Sometimes will have boiled egg and toast (white wheat) - she likes whole wheat bread. L: tuna or cheese toast (whole wheat) D: take out - tries to get baked chicken, mashed poatoes, and sweet potatoes, brussells sprouts. Discussed heart healthy eating and diabetes friendly eating. Discussed which foods were CHO servings and how to count CHOs servings.      Intervention Plan   Intervention Prescribe, educate and counsel regarding individualized specific dietary modifications aiming towards targeted core components such as weight, hypertension, lipid management, diabetes, heart failure and other comorbidities.;Nutrition handout(s) given to patient.    Expected Outcomes Short Term Goal: Understand basic principles of dietary content, such as calories, fat, sodium, cholesterol and nutrients.;Short Term Goal: A plan has been developed with personal nutrition goals set during dietitian appointment.;Long Term Goal: Adherence to prescribed nutrition plan.             Nutrition Assessments:  MEDIFICTS  Score Key: ?70 Need to make dietary changes  40-70 Heart Healthy Diet ? 40 Therapeutic Level Cholesterol Diet  Flowsheet Row Cardiac Rehab from 09/19/2020 in [facility]  Picture Your Plate Total Score on Admission 44      Picture Your Plate Scores: <87 Unhealthy dietary pattern with much room for improvement. 41-50 Dietary pattern unlikely to meet recommendations for good health and room for improvement. 51-60 More healthful dietary pattern, with some room for improvement.  >60 Healthy dietary  pattern, although there may be some specific behaviors that could be improved.    Nutrition Goals Re-Evaluation:  Nutrition Goals Re-Evaluation     Stacey Richardson Name 11/15/20 1449 12/21/20 1807 01/16/21 1120 01/24/21 1738 02/19/21 1747     Goals   Current Weight -- 187 lb (84.8 kg) -- -- --   Nutrition Goal ST: practice counting carbohydrates, use MyPlate structure and count carbohydrates (3-4 servings for meals, 1-2 for snacks) LT: Be independent managing diabetes Lose Weight, have a diet plan ST: come to rehab consistenly, schedule meeting with RD to reiterate diet guidelines and help with meal planning LT: apply nutrition information on her own ST: 4 day food log, practice counting CHOs (2-3, 3-4 servings for meals and 1-2 servings for snacks), eat every 2-3 hours, find and look for recipes LT: apply nutrition information on her own --   Comment Stacey Richardson hasn't been able to work on her goals set with the dietician due to increase of stress in her life. She reports wanting to start working on these goals now and already has some meal plans in place. Stacey Richardson wants to meet with the dietician to go over her diet habits. She has alot of questions about the things she is eating. RD has discussed education and meal planning with patient during initial consulation, however, patient still voicing she needs much more help. Discussed setting up another appointment during one of her exercise times in April - she was then out for some time, just coming back yesterday 01/15/21. Unable to set up a time as she has not been coming consistently, RD will try again to schedule for next week. RD reviewed heart healthy eating as well as diabetes friendly eating in detail and went over examples. We practiced CHO counting and meal building together. Stacey Richardson states she remembers how to count CHO - she has been reading food labels when she eats   Expected Outcome Short: incorporate the goals set with the dietician of counting carbs. Long:  maintain independence with managing heart healthy diet Short: spealk with the dietician. Long: maintain a diet that pertains to her ST: come to rehab consistenly, schedule meeting with RD to reiterate diet guidelines and help with meal planning LT: apply nutrition information on her own ST: 4 day food log, practice counting CHOs (2-3, 3-4 servings for meals and 1-2 servings for snacks), eat every 2-3 hours, find and look for recipes LT: apply nutrition information on her own ST: continue to read food labels Long:  maintain diabetic and heart healthy diet            Nutrition Goals Discharge (Final Nutrition Goals Re-Evaluation):  Nutrition Goals Re-Evaluation - 02/19/21 1747       Goals   Comment Arriah states she remembers how to count CHO - she has been reading food labels when she eats    Expected Outcome ST: continue to read food labels Long:  maintain diabetic and heart healthy diet  Psychosocial: Target Goals: Acknowledge presence or absence of significant depression and/or stress, maximize coping skills, provide positive support system. Participant is able to verbalize types and ability to use techniques and skills needed for reducing stress and depression.   Education: Stress, Anxiety, and Depression - Group verbal and visual presentation to define topics covered.  Reviews how body is impacted by stress, anxiety, and depression.  Also discusses healthy ways to reduce stress and to treat/manage anxiety and depression.  Written material given at graduation.   Education: Sleep Hygiene -Provides group verbal and written instruction about how sleep can affect your health.  Define sleep hygiene, discuss sleep cycles and impact of sleep habits. Review good sleep hygiene tips.    Initial Review & Psychosocial Screening:  Initial Psych Review & Screening - 09/11/20 1145       Initial Review   Current issues with Current Anxiety/Panic;Current Stress Concerns    Source  of Stress Concerns Chronic Illness    Comments Stress over what her surgery and her recovery will do to her Diabetes, getting back to work safely and understanding all that happened.      Family Dynamics   Good Support System? Yes   daughter, brothers.     Barriers   Psychosocial barriers to participate in program There are no identifiable barriers or psychosocial needs.      Screening Interventions   Interventions Encouraged to exercise;To provide support and resources with identified psychosocial needs    Expected Outcomes Short Term goal: Utilizing psychosocial counselor, staff and physician to assist with identification of specific Stressors or current issues interfering with healing process. Setting desired goal for each stressor or current issue identified.;Long Term Goal: Stressors or current issues are controlled or eliminated.;Short Term goal: Identification and review with participant of any Quality of Life or Depression concerns found by scoring the questionnaire.;Long Term goal: The participant improves quality of Life and PHQ9 Scores as seen by post scores and/or verbalization of changes             Quality of Life Scores:   Quality of Life - 09/19/20 1500       Quality of Life   Select Quality of Life      Quality of Life Scores   Health/Function Pre 21.8 %    Socioeconomic Pre 17.44 %    Psych/Spiritual Pre 24 %    Family Pre 25.8 %    GLOBAL Pre 21.8 %            Scores of 19 and below usually indicate a poorer quality of life in these areas.  A difference of  2-3 points is a clinically meaningful difference.  A difference of 2-3 points in the total score of the Quality of Life Index has been associated with significant improvement in overall quality of life, self-image, physical symptoms, and general health in studies assessing change in quality of life.  PHQ-9: Recent Review Flowsheet Data     Depression screen [facility] 2/9 12/21/2020 09/19/2020   Decreased  Interest 0 1   Down, Depressed, Hopeless 1 1   PHQ - 2 Score 1 2   Altered sleeping 1 1   Tired, decreased energy 1 1   Change in appetite 1 1   Feeling bad or failure about yourself  1 0   Trouble concentrating 0 0   Moving slowly or fidgety/restless 0 0   Suicidal thoughts 0 0   PHQ-9 Score 5 5   Difficult doing work/chores  Not difficult at all Not difficult at all      Interpretation of Total Score  Total Score Depression Severity:  1-4 = Minimal depression, 5-9 = Mild depression, 10-14 = Moderate depression, 15-19 = Moderately severe depression, 20-27 = Severe depression   Psychosocial Evaluation and Intervention:  Psychosocial Evaluation - 09/11/20 1202       Psychosocial Evaluation & Interventions   Interventions Encouraged to exercise with the program and follow exercise prescription;Relaxation education    Comments Stacey Richardson has no barriers to entry to the program. She has some anxiety about her chest "opening up if she pushes to hard". Spoke to her that we have never seen this happen here as  we will start slow and work until she feels healed before increasing the her workloads.  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 She  has a good support system with her daughter and her 2 brothers. She is ready to get started and learn about her heart disease, learn the exercise and nutriton steps to lead to a healthy lifestyle and continue to work on managing her diabetes. Stephanee should do well in the program.    Expected Outcomes STG: Attends all scheduled sessions:exercise and education. Continues to verbalize any stress or fears   LTG: Tandy able to utilize what she has learned to maintain a healthy lifestyle and has been able to diminish her stress and fears about her heart disease .    Continue  Psychosocial Services  Follow up required by staff             Psychosocial Re-Evaluation:  Psychosocial Re-Evaluation     Stacey Richardson Name 10/30/20 1400 11/15/20 1438 12/21/20 1804 12/27/20 1804 01/15/21 1758     Psychosocial Re-Evaluation   Current issues with Current Stress Concerns;Current Sleep Concerns Current Stress Concerns;Current Sleep Concerns History of Depression;Current Anxiety/Panic History of Depression;Current Anxiety/Panic Current Anxiety/Panic   Comments Stacey Richardson is still stressed out some about her health.  She feels good about attending HT and the education.  She doesnt want to go back to work yet.  Staff suggested she talk to HR at her employer about disability.  She sleeps well sometimes - she does have a prescirption for sleep medication.  She will ask Dr about melatonin. Stacey Richardson's anxiety has been hightened these last few weeks as she has gone to her follow up doctor appointments and didn't receive the news she was hoping. Her EF has stayed the same at 30-35% which means they are talking about placing an ICD and trialing new medications. They already placed a loop recorder and she continues ot have bouts of SVT. She is very anxious about her prognosis and feels like she has been given a death sentence. She wants to do anything she can to help her live a long life. Information was given by staff and by her cardiologist about ICD and heart failure. She found a new work from home job and starts training next week. She is hoping she can manage that schedule. Her sleep has taken a hit given all the stress, she has been scared to try the clonidine her MD gave her but may try 1/2 a pill since she is so tired from stressing and not sleeping. Her family is supportive, although she  states one of her brothers can be a little much when it comes to giving her advice. She is too scared to seek out a personal relationship because she doesn't know what her prognosis is. Reviewed patient health  questionnaire (PHQ-9) with patient for follow up. Previously, patients score indicated signs/symptoms of depression.  Reviewed to see if patient is improving symptom wise while in program.  Score stayed the same. She has some anxiety about her new ICD. Informed her about the machine and what it is used for. Patient was very anxious today regarding her ICD, heart failure and diabetes. RD to set up follow up appointment regarding diet. She is nervous about her lightheadedness; discussed medications, heart failure, and role of anxiety in this. Encouraged attendance at rehab and talk therapy - she is scheduled to see someone tuesday. Discussed relaxation techniques and breathing techniques as well as the importance of laughter. Ritta has been very nervous still about her heart situation. She was prescribed Xanax before, and she felt it helped but was stopped by a doctor. She does plan to ask her doctor again if she is appropriate for another oral medication to help with her anxiety, however, she is also nervous something is going to interact with her heart medications. She will talk to her doctor about that. and also seeing somebody for her mental health. We talked about using deep breathing and guided meditation on [email or web address] She was given another home exercise packet that also shows how to do the progressive muscular relaxation. She has had a lot of support and advice from staff members here on working with her anxiety. She knows when to call her doctor. She can count on her daughter for support at home as well.   Expected Outcomes Short:  follow up with Dr about sleep meds Long: develop good stress management and sleep habits Short: follow up with EP on 3/23 to discuss ICD placement and options, try taking medication to sleep. Long: develop and maintain stress management techniques while continuing to become educated on her diagnosis Short: Continue to work toward an improvement in Stacey Richardson by attending  [facility] regularly. Long: Continue to improve stress and depression coping skills by talking with staff and attending [facility] regularly and work toward a positive mental state. Short: Continue to work toward an improvement in Stacey Richardson scores by attending [facility] regularly. Talk with counselor. Long: Continue to improve stress and depression coping skills by talking with staff and attending [facility] regularly and work toward a positive mental state. Short: Talk to doctor about medications Long: Work on personal goals for mental health, use exercise to help with stress management/relaxation  and positive attitude   Interventions -- Relaxation education;Encouraged to attend Cardiac Rehabilitation for the exercise;Stress management education Encouraged to attend Cardiac Rehabilitation for the exercise Encouraged to attend Cardiac Rehabilitation for the exercise Encouraged to attend Cardiac Rehabilitation for the exercise   Continue Psychosocial Services  -- Follow up required by staff Follow up required by staff Follow up required by staff Follow up required by staff     Initial Review   Source of Stress Concerns -- -- -- Chronic Illness;Financial --    Row Name 02/19/21 1751             Psychosocial Re-Evaluation   Current issues with Current Anxiety/Panic       Comments Stacey Richardson sees her Dr this week and will ask about something for anxiety.  She will also try practicing some brathing adn progressive relaxation.  Expected Outcomes Short: talk with Dr an try mindfulness meditation  Long:  exercise and practice breathing regularly                Psychosocial Discharge (Final Psychosocial Re-Evaluation):  Psychosocial Re-Evaluation - 02/19/21 1751       Psychosocial Re-Evaluation   Current issues with Current Anxiety/Panic    Comments Stacey Richardson sees her Dr this week and will ask about something for anxiety.  She will also try practicing some brathing adn progressive relaxation.     Expected Outcomes Short: talk with Dr an try mindfulness meditation  Long:  exercise and practice breathing regularly             Vocational Rehabilitation: Provide vocational rehab assistance to qualifying candidates.   Vocational Rehab Evaluation & Intervention:  Vocational Rehab - 09/11/20 1153       Initial Vocational Rehab Evaluation & Intervention   Assessment shows need for Vocational Rehabilitation No             Education: Education Goals: Education classes will be provided on a variety of topics geared toward better understanding of heart health and risk factor modification. Participant will state understanding/return demonstration of topics presented as noted by education test scores.  Learning Barriers/Preferences:  Learning Barriers/Preferences - 09/11/20 1152       Learning Barriers/Preferences   Learning Barriers None    Learning Preferences Video             General Cardiac Education Topics:  AED/CPR: - Group verbal and written instruction with the use of models to demonstrate the basic use of the AED with the basic ABC's of resuscitation.   Anatomy and Cardiac Procedures: - Group verbal and visual presentation and models provide information about basic cardiac anatomy and function. Reviews the testing methods done to diagnose heart disease and the outcomes of the test results. Describes the treatment choices: Medical Management, Angioplasty, or Coronary Bypass Surgery for treating various heart conditions including Myocardial Infarction, Angina, Valve Disease, and Cardiac Arrhythmias.  Written material given at graduation.   Medication Safety: - Group verbal and visual instruction to review commonly prescribed medications for heart and lung disease. Reviews the medication, class of the drug, and side effects. Includes the steps to properly store meds and maintain the prescription regimen.  Written material given at graduation. Flowsheet Row  Cardiac Rehab from 01/24/2021 in [facility] Cardiac and Pulmonary Rehab  Date 01/24/21  Educator [facility]  Instruction Review Code 1- Verbalizes Understanding       Intimacy: - Group verbal instruction through game format to discuss how heart and lung disease can affect sexual intimacy. Written material given at graduation..   Know Your Numbers and Heart Failure: - Group verbal and visual instruction to discuss disease risk factors for cardiac and pulmonary disease and treatment options.  Reviews associated critical values for Overweight/Obesity, Hypertension, Cholesterol, and Diabetes.  Discusses basics of heart failure: signs/symptoms and treatments.  Introduces Heart Failure Zone chart for action plan for heart failure.  Written material given at graduation.   Infection Prevention: - Provides verbal and written material to individual with discussion of infection control including proper hand washing and proper equipment cleaning during exercise session. Flowsheet Row Cardiac Rehab from 01/24/2021 in [facility] Cardiac and Pulmonary Rehab  Date 09/19/20  Educator AS  Instruction Review Code 1- Verbalizes Understanding       Falls Prevention: - Provides verbal and written material to individual with discussion of falls prevention and safety. Flowsheet  Row Cardiac Rehab from 01/24/2021 in [facility] Cardiac and Pulmonary Rehab  Date 09/19/20  Educator AS  Instruction Review Code 1- Verbalizes Understanding       Other: -Provides group and verbal instruction on various topics (see comments)   Knowledge Questionnaire Score:  Knowledge Questionnaire Score - 09/19/20 1501       Knowledge Questionnaire Score   Pre Score 23/26 angina nutrition             Core Components/Risk Factors/Patient Goals at Admission:  Personal Goals and Risk Factors at Admission - 09/19/20 1459       Core Components/Risk Factors/Patient Goals on Admission    Weight Management Weight Loss;Yes    Intervention  Weight Management/Obesity: Establish reasonable short term and long term weight goals.;Weight Management: Develop a combined nutrition and exercise program designed to reach desired caloric intake, while maintaining appropriate intake of nutrient and fiber, sodium and fats, and appropriate energy expenditure required for the weight goal.;Weight Management: Provide education and appropriate resources to help participant work on and attain dietary goals.    Admit Weight 186 lb (84.4 kg)    Goal Weight: Short Term 180 lb (81.6 kg)    Goal Weight: Long Term 160 lb (72.6 kg)    Expected Outcomes Short Term: Continue to assess and modify interventions until short term weight is achieved;Long Term: Adherence to nutrition and physical activity/exercise program aimed toward attainment of established weight goal;Weight Loss: Understanding of general recommendations for a balanced deficit meal plan, which promotes 1-2 lb weight loss per week and includes a negative energy balance of [phone] kcal/d    Diabetes Yes    Intervention Provide education about signs/symptoms and action to take for hypo/hyperglycemia.;Provide education about proper nutrition, including hydration, and aerobic/resistive exercise prescription along with prescribed medications to achieve blood glucose in normal ranges: Fasting glucose 65-99 mg/dL    Expected Outcomes Short Term: Participant verbalizes understanding of the signs/symptoms and immediate care of hyper/hypoglycemia, proper foot care and importance of medication, aerobic/resistive exercise and nutrition plan for blood glucose control.;Long Term: Attainment of HbA1C < 7%.    Intervention Provide education on lifestyle modifcations including regular physical activity/exercise, weight management, moderate sodium restriction and increased consumption of fresh fruit, vegetables, and low fat dairy, alcohol moderation, and smoking cessation.;Monitor prescription use compliance.    Expected  Outcomes Short Term: Continued assessment and intervention until BP is < 140/50mm HG in hypertensive participants. < 130/24mm HG in hypertensive participants with diabetes, heart failure or chronic kidney disease.;Long Term: Maintenance of blood pressure at goal levels.    Lipids Yes    Intervention Provide education and support for participant on nutrition & aerobic/resistive exercise along with prescribed medications to achieve LDL 70mg , HDL >40mg .    Expected Outcomes Short Term: Participant states understanding of desired cholesterol values and is compliant with medications prescribed. Participant is following exercise prescription and nutrition guidelines.;Long Term: Cholesterol controlled with medications as prescribed, with individualized exercise RX and with personalized nutrition plan. Value goals: LDL < 70mg , HDL > 40 mg.             Education:Diabetes - Individual verbal and written instruction to review signs/symptoms of diabetes, desired ranges of glucose level fasting, after meals and with exercise. Acknowledge that pre and post exercise glucose checks will be done for 3 sessions at entry of program. [place] from 01/24/2021 in [facility] Cardiac and Pulmonary Rehab  Date 09/19/20  Educator AS  Instruction Review Code 1- Verbalizes Understanding  Core Components/Risk Factors/Patient Goals Review:   Goals and Risk Factor Review     Row Name 10/30/20 1356 11/15/20 1429 12/21/20 1757 01/15/21 1748 02/19/21 1744     Core Components/Risk Factors/Patient Goals Review   Personal Goals Review Weight Management/Obesity;Hypertension;Lipids;Diabetes Weight Management/Obesity;Hypertension;Diabetes;Heart Failure Weight Management/Obesity;Diabetes;Hypertension Weight Management/Obesity;Diabetes;Hypertension Weight Management/Obesity;Diabetes;Hypertension   Review Helene is monitoring BP and BG at home.  Her normal FBG is around 90-95.  She also monitors BP at home .   Today was 110/62 at [facility].  She is taking all medications as directed. Nandi's BP is stable and is monitoring it while the cardiologist is trialing medications for her heart failure. Her diabetes is being managed well, so well that her MD has decreased her diabetic medication. She recently had a repeat echo and her EF was 30-35%, which was unchanged. Since it did not improve and she is still having runs of SVT they are talking with her about getting an ICD and has appointment with the electrophysiologist Janette wants to get down to 160 pounds. She has returned to rehab after ICD and is ready to continue. Her blood sugars have been 109 in the morning fasting. She feels like she has her sugar in check. Her blood pressure has been stable and is checking it at home. Codee has been checking her blood sugars about 4x/ day. At fasting, it ranges between 120-130 which she states her doctor is OK with.  She admits when she eats sweets here and there, it shoots up to 200. BP at home she checks is between 545G systolic/ under 25W diastolic. Kiah's weight has been staying the same, but she does wish to lose weight. She plans to buy a scale so she can monitor better at home. She plans to move more and change her diet to more "diabetic friendly." Mekhi says her BG has been high.  She called her Dr today to ask about changing dose of medication.  She does have a scale to weigh herself.  She continues to work on [employer] diabetic friendly.   Expected Outcomes Short: continue to monitor vitals at home Long:  manage risk factors long term Short: attend EP appointment to discuss ICD on 3/23 Long: continue managing BP and Diabetes, work with MD on plan for HF Short: lose 5 pounds in the next 2 weeks. Long: reach a weight goal of 160 pounds. Short Get a scale and monitor weight more routinely Long: Continue to manage lifestyle risk factors Short:  continueto monitor sugar and weight Long:  manage risk factors and keep in  optimal range    Row Name 02/19/21 1751             Core Components/Risk Factors/Patient Goals Review   Review Stacey Richardson says her BG has been high.  She called her Dr today to ask about changing dose of medication.  She does have a scale to weigh herself.  She continues to work on eating more diabetic friendly.  Her FBG is around 130.  Her Dr recommended not checking BG except fasting.                Core Components/Risk Factors/Patient Goals at Discharge (Final Review):   Goals and Risk Factor Review - 02/19/21 1751       Core Components/Risk Factors/Patient Goals Review   Review Stacey Richardson says her BG has been high.  She called her Dr today to ask about changing dose of medication.  She does have a scale to weigh herself.  She continues to work on eating more diabetic friendly.  Her FBG is around 130.  Her Dr recommended not checking BG except fasting.             ITP Comments:  ITP Comments     Row Name 09/11/20 1222 09/19/20 1506 09/21/20 1458 10/04/20 0858 10/19/20 1458   ITP Comments Virtual orientation call completed today. shehas an appointment on Date: 09/13/2020  for EP eval and gym Orientation.  Documentation of diagnosis [facility] be [facility] in [facility] Date: 08/02/2020. Completed 6MWT and gym orientation. Initial ITP created and sent for review to Dr. Emily Filbert, Medical Director. First full day of exercise!  Patient was oriented to gym and equipment including functions, settings, policies, and procedures.  Patient's individual exercise prescription and treatment plan were reviewed.  All starting workloads were established based on the results of the 6 minute walk test done at initial orientation visit.  The plan for exercise progression was also introduced and progression will be customized based on patient's performance and goals. 30 Day review completed. Medical Director ITP review done, changes made as directed, and signed approval by Medical Director.  New to program Satine  stopped by today to inform staff she had a loop recorder placed yesterday and she didn't feel comfortable exercising. She was nervous about it and everthing that is going on with her heart. Information and support given to her as she tries to figure everything out. She plans to return Monday as she really enjoys the program. She was encouraged to reach out to her doctor and support staff with further questions regarding the loop recorder and that more education on a heart healthy lifestyle will be given while in the program.    [place] Name 11/01/20 0620 11/29/20 0727 12/11/20 1609 12/14/20 1208 12/26/20 0823   ITP Comments 30 Day review completed. Medical Director ITP review done, changes made as directed, and signed approval by Medical Director. 30 Day review completed. Medical Director ITP review done, changes made as directed, and signed approval by Medical Director. Called to check on patient.  She had gotten new job. She did decided to get her ICD.  She missed time for training and lost her new job. She would like to return to rehab.  She plans to return on Wednesday 12/13/20. Jamecia has not yet returned and has not attended since last review. Alizaya has attended once since last review.    Row Name 12/27/20 0943 01/11/21 1739 01/24/21 0640 02/21/21 0714 03/06/21 1550   ITP Comments 30 Day review completed. Medical Director ITP review done, changes made as directed, and signed approval by Medical Director. Jolinda has not attended since last review 30 Day review completed. Medical Director ITP review done, changes made as directed, and signed approval by Medical Director.  3 visits in [place] 30 Day review completed. Medical Director ITP review done, changes made as directed, and signed approval by Medical Director. Patient lost insurance and will be discharged from [facility].            Comments: Discharge ITP

## 2021-03-06 NOTE — Telephone Encounter (Signed)
Patient called and stated that she lost her insurance and is not able to participate in Cardiac Rehab due to the out of pocket cost. Patient was due to graduate on 6/30. She is talking to her doctor about possibly a new referral, once she has insurance figured out. Discharging patient at this time.

## 2021-03-07 ENCOUNTER — Telehealth: Payer: Self-pay | Admitting: Internal Medicine

## 2021-03-07 ENCOUNTER — Ambulatory Visit (INDEPENDENT_AMBULATORY_CARE_PROVIDER_SITE_OTHER): Payer: Medicaid Other

## 2021-03-07 DIAGNOSIS — I255 Ischemic cardiomyopathy: Secondary | ICD-10-CM | POA: Diagnosis not present

## 2021-03-07 LAB — CUP PACEART REMOTE DEVICE CHECK
Date Time Interrogation Session: 20220627115716
Implantable Lead Implant Date: 20220329
Implantable Lead Location: 753860
Implantable Lead Model: 436909
Implantable Lead Serial Number: 81437635
Implantable Pulse Generator Implant Date: 20220329
Pulse Gen Model: 429525
Pulse Gen Serial Number: 84824217

## 2021-03-07 NOTE — Telephone Encounter (Signed)
Patient needs a letter stating his cardiac diagnosis due to insurance purposes. Please call to discuss

## 2021-03-08 ENCOUNTER — Telehealth: Payer: Self-pay | Admitting: Licensed Clinical Social Worker

## 2021-03-08 NOTE — Telephone Encounter (Signed)
Patient returning call.

## 2021-03-08 NOTE — Progress Notes (Addendum)
Heart and Vascular Care Navigation  03/08/2021  JAMITA MCKELVIN [birth year] [id number]  Reason for Referral:    Engaged with patient by telephone for initial visit for Heart and Vascular Care Coordination.                                                                                                   Assessment:     LCSW received a call back from pt. I introduced self, role, reason for call. Pt confirmed home address, emergency contacts and PCP. She lives with a former partner, now just a roommate, who has been paying the bills as she is out of work due to her health. 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 She has filed for both [employer] and Disability and states she is working w/ [employer] to complete those requests. I encouraged her to keep up with both of those organizations to check on the status of her applications- especially if she has been to the doctor/had a hospital admission.                               Pt very tearful throughout our conversation- she shares that she starts looking up Internet information about heart failure and it is very upsetting for her. These feelings also worsen when she dwells on her inability to work. I provided pt with verbal support for her concerns, I shared that there is support for her out there and offered her information for our Women's Heart Strong Support Group. Pt states interest and requests that I send information to her email [email or web address]  LCSW discussed additional resources that I could provide including a [employer], Chief Operating Officer, [employer] application and enroll her in [employer]. Pt interested in all of these.   HRT/VAS Care Coordination     Patients Home Cardiology Office [facility]   Outpatient Care Team Social Worker   Social Worker Name: Valeda Malm, [place] [facility] [phone]   Living arrangements for the past 2 months  Single Family Home   Lives with: Roommate   Patient Current Insurance Coverage Self-Pay   Patient Has Concern With [place] Yes   Patient Concerns With Medical Bills no insurance, ongoing medical care   Medical Bill Referrals: Medicaid pending per pt   Does Patient Have Prescription Coverage? No   Patient Prescription Assistance Programs [place] [facility]   [place] [facility] Medications mailed application 7/1   Home Assistive Devices/Equipment None; CBG Meter       Social History:                                                                             SDOH Screenings   Alcohol Screen: Not  on file  Depression (PHQ2-9): Medium Risk   PHQ-2 Score: 5  Financial Resource Strain: High Risk   Difficulty of Paying Living Expenses: Very hard  Food Insecurity: Food Insecurity Present   Worried About Charity fundraiser in the Last Year: Sometimes true   Ran Out of Food in the Last Year: Sometimes true  Housing: Medium Risk   Last Housing Risk Score: 1  Physical Activity: Not on file  Social Connections: Not on file  Stress: Not on file  Tobacco Use: Medium Risk   Smoking Tobacco Use: Former   Smokeless Tobacco Use: Never  Transportation Needs: Public librarian (Medical): No   Lack of Transportation (Non-Medical): Yes    SDOH Interventions: Financial Resources:  Financial Strain Interventions: Other (Comment) (Medicaid pending; mailed NCMedAssist Application; mailed resources including county assistance packet and information about SNAP) DSS for financial assistance, Occupational hygienist for [employer], and Social Security for Disability application assistance  Food Insecurity:  Food Insecurity Interventions: Other (Comment) (mailed SNAP application)  Housing Insecurity:  Housing Interventions: Other (Comment) [facility] [employer] guide sent)  Transportation:   Transportation Interventions: [employer]      Other Care Navigation Interventions:     Provided Pharmacy assistance resources [place] [facility]  Patient expressed Mental Health concerns Yes, Referred to:  Womens HF Support Group and [employer] Health Resources    Follow-up plan:   I have mailed all resources above to pt, I emailed at her request information about our support group which meets next week and enrolled her in [employer] should she want to utilize those services. I remain available, will check in with pt when I return to office.   Confirmed with [facility] Financial Counseling that pt has a pending application with [place] worker Ms. Evans at [phone].

## 2021-03-08 NOTE — Telephone Encounter (Signed)
Spoke with pt. She no longer has health insurance and is in process of waiting for Medicaid approval and disability. Pt requests documentation noting pt's cardiac diagnoses needed for insurance purpose. Notified pt her office notes are available on MyChart, but I will print for her per request. Pt also expresses financial burdens in the interim of filing for assistance. Discussed patient assistance and medication management options that pt may apply for through [employer]. Also notified pt I will notify our social worker to reach out to pt regarding any further resources that may be available. Pt appreciative of assistance. Medication management, pt assistance applications and last office note left at front desk for pt to pick up. Forwarding to SW. Pt states she has no further needs at this time.  Pt will follow up as scheduled 04/12/21.

## 2021-03-08 NOTE — Telephone Encounter (Signed)
Attempted to call pt to discuss in further detail. No answer at this time. Lmtcb.

## 2021-03-08 NOTE — Telephone Encounter (Signed)
LCSW attempted to reach pt via telephone at [phone].  No answer, message left requesting return call.   Westley Hummer, MSW, [place]  [phone]

## 2021-03-15 ENCOUNTER — Other Ambulatory Visit: Payer: Self-pay | Admitting: Internal Medicine

## 2021-03-19 ENCOUNTER — Telehealth: Payer: Self-pay | Admitting: Licensed Clinical Social Worker

## 2021-03-19 NOTE — Telephone Encounter (Signed)
Received email from pt stating she had not been able to come to support group last week but is interested in the future and inquired about the date. Provided her with the date and let her know that I would add her to our list for f/u. I plan to call pt again tomorrow to see if resources sent had arrived.   Westley Hummer, MSW, [place]  [phone]

## 2021-03-23 ENCOUNTER — Telehealth: Payer: Self-pay | Admitting: Licensed Clinical Social Worker

## 2021-03-23 NOTE — Telephone Encounter (Signed)
Attempted to reach pt via telephone at [phone] for check in/follow up on assistance applications provided. No answer, message left.   Westley Hummer, MSW, [place]  [phone]

## 2021-03-26 ENCOUNTER — Ambulatory Visit (INDEPENDENT_AMBULATORY_CARE_PROVIDER_SITE_OTHER): Payer: Self-pay | Admitting: Adult Health

## 2021-03-26 ENCOUNTER — Encounter: Payer: Self-pay | Admitting: Adult Health

## 2021-03-26 ENCOUNTER — Telehealth: Payer: Self-pay | Admitting: Licensed Clinical Social Worker

## 2021-03-26 ENCOUNTER — Other Ambulatory Visit: Payer: Self-pay

## 2021-03-26 VITALS — BP 126/74 | HR 70 | Ht 66.0 in | Wt 197.0 lb

## 2021-03-26 DIAGNOSIS — E669 Obesity, unspecified: Secondary | ICD-10-CM

## 2021-03-26 DIAGNOSIS — I10 Essential (primary) hypertension: Secondary | ICD-10-CM

## 2021-03-26 DIAGNOSIS — E119 Type 2 diabetes mellitus without complications: Secondary | ICD-10-CM

## 2021-03-26 DIAGNOSIS — E785 Hyperlipidemia, unspecified: Secondary | ICD-10-CM

## 2021-03-26 DIAGNOSIS — I639 Cerebral infarction, unspecified: Secondary | ICD-10-CM

## 2021-03-26 NOTE — Progress Notes (Signed)
Heart and Vascular Care Navigation  03/26/2021  Stacey Richardson [birth year] [id number]  Reason for Referral:    Engaged with patient by telephone for follow up visit for Heart and Vascular Care Coordination.                                                                                                   Assessment:                                     LCSW called and left message for pt this morning. Pt returned my call. Shares she has left her previous residence, has been staying with her brother/brother's girlfriend. She worries she is not fully welcome there based on some interactions with her brother's significant other. She also received a letter from [employer] stating that her car was over the asset limit, therefore she may not be eligible for Medicaid at this time. Pt was approved for SNAP assistance at this time and has received her card.   LCSW provided verbal support for stress that losing her housing and feeling not fully welcome must be having on her at this time. Encouraged her to reach out to other family/friends that may be able to provide her with housing support. We also discussed reaching out to Coordinated Entry lines. I did caution that unfortunately many of these resources are not able to assist so long as she has somewhere to stay but they may be able to connect her with additional agencies that can help.   I also encouraged pt to reach out directly to the caseworker who's name is on the letter to clarify why the car was included in her asset limit and to see what suggestions they have since she is so close to the asset cap. I stressed the importance of also doing this because if she does not reach out to them they may just close her case.  LCSW inquired about [facility] counselor that had reached out to pt. Pt states she wasn't clear what the program offers/if she was eligible since she no longer works at [employer]. Shared that it may be worth getting back in touch to clarify  why she was reached out to and what services they provide if she is eligible.   I inquired how pt would like these resources sent to her. She requests e-mail. Reports she received my resources previously and is working on them- pending some of the supporting documents. Pt states that she was able to bring her things/medications with her when she left her previous residence. Encouraged her to f/u with me should anything else arise.     HRT/VAS Care Coordination     Patients Home Cardiology Office [facility]   Outpatient Care Team Social Worker   Social Worker Name: Valeda Malm, [place] [facility] [phone]   Living arrangements for the past 2 months Single Family Home   Lives with: Roommate   Patient Current Insurance Coverage Self-Pay   Patient Has Concern With  Paying Medical Bills Yes   Patient Concerns With Medical Bills no insurance, ongoing medical care   Medical Bill Referrals: Medicaid pending per pt   Does Patient Have Prescription Coverage? No   Patient Prescription Assistance Programs [place] [facility]   [place] [facility] Medications mailed application 7/1   Home Assistive Devices/Equipment None; CBG Meter       Social History:                                                                             SDOH Screenings   Alcohol Screen: Not on file  Depression (PHQ2-9): Medium Risk   PHQ-2 Score: 5  Financial Resource Strain: High Risk   Difficulty of Paying Living Expenses: Very hard  Food Insecurity: Food Insecurity Present   Worried About Charity fundraiser in the Last Year: Sometimes true   Ran Out of Food in the Last Year: Sometimes true  Housing: Medium Risk   Last Housing Risk Score: 1  Physical Activity: Not on file  Social Connections: Not on file  Stress: Not on file  Tobacco Use: Medium Risk   Smoking Tobacco Use: Former   Smokeless Tobacco Use: Never  Transportation Needs: Public librarian (Medical): No    Lack of Transportation (Non-Medical): Yes    SDOH Interventions: Financial Resources:  Financial Strain Interventions: Other (Comment) (encouraged pt to f/u with DSS caseworker about Medicaid letter she recieved; working on assistance applications (NCMedAssist/CAFA as back up)) DSS for financial assistance  Food Insecurity:  Food Insecurity Interventions: Other (Comment) (pt has been approved for [employer], recieved card)  Housing Insecurity:  Housing Interventions: Other (Comment) (housing assistance help lines sent)  Transportation:        Other Care Navigation Interventions:     Provided Pharmacy assistance resources  Pt working on [employer] application  Patient expressed Mental Health concerns Yes, Referred to:  [employer], mental health resources in [place]   Follow-up plan:   Resources sent to e-mail on file. Encouraged pt to f/u with me as any additional questions/concerns arise. Will check in moving forward with pt, encouraged her to come to [place] women's Heart Strong group.

## 2021-03-26 NOTE — Progress Notes (Signed)
[facility] [address] [place]. [place] [address] [phone]       OFFICE FOLLOW UP NOTE  Stacey Richardson Date of Birth:  1966-09-06 Medical Record Number:  [id number]   Referring MD: Lars Mage  Reason for Referral: Stroke  Chief Complaint  Patient presents with   Follow-up    RM 3 alone Pt is well and stable. Just concerned for having another stroke.       HPI:   Today, 03/26/2021, [name] is being seen for stroke follow up unaccompanied.  She has been stable since prior visit without new or reoccurring stroke/TIA symptoms.  Reports compliance on aspirin and atorvastatin without associated side effects.  Blood pressure today 126/74.  She is concerned regarding gradual weight gain as well as occasional lightheadedness.  She has follow-up visit tomorrow with cardiology and plans on further discussing lightheadedness during visit.  She has since completed cardiac rehab.  She does report increased stressors as she is having difficulty obtaining medical insurance as she is not currently working.  No further concerns at this time.    History provided for reference purposes only Initial visit 12/11/2020 Dr. Leonie Man: [name] is a 55 year old African-American lady seen today for initial office consultation visit for stroke.  History is obtained from the patient and review of electronic medical records and I personally reviewed imaging films in PACS.  She has past medical history of diabetes hypertension mild obesity.  She presented on 06/12/2020 with sudden onset of dizziness and dropping food plate from the right hand.  She felt faint hearted and she had just received some bad news about her boyfriend being involved in a car accident.  When she came to the ED she had trouble speaking to the triage providers and was not able to get her words out but was able to text and explained that she was having trouble speaking.  Blood pressure slightly elevated.  CT scan of  the head was unremarkable.  MRI scan showed a tiny punctate left frontal insular infarct.  MR angiogram of the brain and neck were both unremarkable.  She subsequently had outpatient transcranial Doppler bubble study done on 08/25/2020 which was done which showed medium size right-to-left shunt however a TEE on 07/18/2020 was negative for PFO or cardiac source of embolism.  An echocardiogram on 11/28/2020 which showed diminished ejection fraction of 30 to 35%.  She had a ICD implantation done on 12/05/2020 by Dr. Quentin Ore with removal of implantable loop recorder..  Patient states she has had no further recurrent stroke or TIA symptoms.  Blood pressures well controlled today it is 128/72.  She remains on aspirin and Plavix which is tolerating well without bruising or bleeding.  Blood sugars have ranged in the 130s fasting usually.  She cannot tell me what her last A1c or lipid profile was.  She has no new complaints today.  ROS:   14 system review of systems is positive for those listed in HPI only all other systems negative  PMH:  Past Medical History:  Diagnosis Date   CAD (coronary artery disease)    CABG (07/2020; LIMA-LAD, RIMA-OM1)   CVA (cerebral vascular accident) ([place])    Diabetes mellitus    HFrEF (heart failure with reduced ejection fraction) ([place])    HLD (hyperlipidemia)    Hypertension    SVT (supraventricular tachycardia) ([place])    s/p ablation (11/2020)    Social History:  Social History   Socioeconomic History   Marital  status: Divorced    Spouse name: Not on file   Number of children: Not on file   Years of education: Not on file   Highest education level: Not on file  Occupational History   Occupation: on short term disability  Tobacco Use   Smoking status: Former   Smokeless tobacco: Never   Tobacco comments:    Quit 30 years ago  Vaping Use   Vaping Use: Never used  Substance and Sexual Activity   Alcohol use: Yes    Comment: social   Drug use: No   Sexual  activity: Not on file  Other Topics Concern   Not on file  Social History Narrative   Lives with boyfriend   Right Handed   Drinks no caffeine daily   Social Determinants of Health   Financial Resource Strain: High Risk   Difficulty of Paying Living Expenses: Very hard  Food Insecurity: Landscape architect Present   Worried About Charity fundraiser in the Last Year: Sometimes true   Arboriculturist in the Last Year: Sometimes true  Transportation Needs: Unmet Transportation Needs   Lack of Transportation (Medical): No   Lack of Transportation (Non-Medical): Yes  Physical Activity: Not on file  Stress: Not on file  Social Connections: Not on file  Intimate Partner Violence: Not on file    Medications:   Current Outpatient Medications on File Prior to Visit  Medication Sig Dispense Refill   acetaminophen (TYLENOL) 500 MG tablet Take 500 mg by mouth every 6 (six) hours as needed for moderate pain or headache.     alum & mag hydroxide-simeth (MAALOX ADVANCED MAX ST) 400-400-40 MG/5ML suspension Take 15 mLs by mouth every 6 (six) hours as needed for indigestion. 355 mL 0   aspirin EC 81 MG tablet Take 81 mg by mouth daily.      atorvastatin (LIPITOR) 80 MG tablet Take 80 mg by mouth at bedtime.     carvedilol (COREG) 12.5 MG tablet TAKE 1 TABLET BY MOUTH 2 TIMES DAILY. 60 tablet 1   losartan (COZAAR) 25 MG tablet TAKE 1 TABLET (25 MG TOTAL) BY MOUTH DAILY. 90 tablet 3   metFORMIN (GLUCOPHAGE-XR) 500 MG 24 hr tablet Take 500 mg by mouth daily with breakfast.     spironolactone (ALDACTONE) 25 MG tablet TAKE 1/2 TABLET BY MOUTH EVERY DAY 15 tablet 1   tetrahydrozoline 0.05 % ophthalmic solution Place 1-2 drops into both eyes daily as needed (irritated eyes.). visine     No current facility-administered medications on file prior to visit.    Allergies:   Allergies  Allergen Reactions   Peanut Butter Flavor Anaphylaxis and Swelling   Lisinopril Cough    Physical Exam Today's Vitals    03/26/21 1538  BP: 126/74  Pulse: 70  Weight: 197 lb (89.4 kg)  Height: 5\' 6"  (1.676 m)   Body mass index is 31.8 kg/m.  General: Obese middle-age African-American lady seated, in no evident distress Head: head normocephalic and atraumatic.   Neck: supple with no carotid or supraclavicular bruits Cardiovascular: regular rate and rhythm, no murmurs Musculoskeletal: no deformity Skin:  no rash/petichiae Vascular:  Normal pulses all extremities  Neurologic Exam Mental Status: Awake and fully alert.  Fluent speech and language.  Oriented to place and time. Recent and remote memory intact. Attention span, concentration and fund of knowledge appropriate. Mood and affect appropriate.  Cranial Nerves: Pupils equal, briskly reactive to light. Extraocular movements full without nystagmus. Visual fields  full to confrontation. Hearing intact. Facial sensation intact. Face, tongue, palate moves normally and symmetrically.  Motor: Normal bulk and tone. Normal strength in all tested extremity muscles. Sensory.: intact to touch , pinprick , position and vibratory sensation.  Coordination: Rapid alternating movements normal in all extremities. Finger-to-nose and heel-to-shin performed accurately bilaterally. Gait and Station: Arises from chair without difficulty. Stance is normal. Gait demonstrates normal stride length and balance .  Not able to heel, toe and tandem walk without difficulty.  Reflexes: 1+ and symmetric. Toes downgoing.       ASSESSMENT: 55 year old patient with episode of expressive aphasia due to left frontal MCA branch infarct of cryptogenic etiology without residual deficit.  Vascular risk factors of diabetes, hypertension hyperlipidemia, ischemic cardiomyopathy and mild obesity.    PLAN:  -Continue aspirin 81 mg daily  for secondary stroke prevention  -referral placed to healthy weight and wellness for further weight loss assistance -maintain strict control of hypertension  with blood pressure goal below 130/90, diabetes with hemoglobin A1c goal below 7% and lipids with LDL cholesterol goal below 70 mg/dL.  -Lab work 12/11/2020: LDL 59; A1c 6.5 -Advised to further discuss lightheadedness concerns with cardiology as low suspicion for neurological etiology -also discussed importance of adequate fluid intake (currently drinks 1-2 bottles of water/day) -Information provided regarding current financial assistance program   Follow-up in 6 months or call earlier if needed    CC:  [facility] provider: Dr. Sheldon Silvan, [name], MD   I spent 29 minutes of face-to-face and non-face-to-face time with patient.  This included previsit chart review, lab review, study review, order entry, electronic health record documentation, patient education and discussion regarding history of prior stroke, secondary stroke prevention measures and aggressive stroke risk factor management, weight gain and lightheadedness concerns and answered all other questions to patient's satisfaction  Frann Rider, AGNP-BC  [facility] [address] [place], [place] [address]  Phone [phone] Fax [phone] Note: This document was prepared with digital dictation and possible smart phrase technology. Any transcriptional errors that result from this process are unintentional.

## 2021-03-26 NOTE — Telephone Encounter (Signed)
E-mail sent with the following information:  Hi Ms. Boykin Reaper,  I again am sorry to hear so many additional stressful things have occurred over the past few weeks.  I first encourage you to definitely reach out to the caseworker at the Department of Social Services regarding the resource limit letter. My understanding was that if you utilize that car as your primary mode of transportation that usually it did not qualify towards assets; however, the caseworker will specifically be able to clarify any questions you have about that- and if there are ways in which you are able to get under the asset limit. It is important to make sure they hear from you so they don't close the case due to not being able to get in touch with you.   The following are the housing resources for [place] and surrounding counties [place], [place]). Counties are now operating under a Chiropractor system which means you must contact a main line with your needs and they will try and connect you with the appropriate resources. I think perhaps this is what the [place] mentioned when they referred to an advocate. I have listed the coordinated entry numbers below.    [place]: Coordinated Assessment (Emergency Crisis Housing) [phone] Ext. 104 M-F, 8am - 4pm Scientist, physiological 24 hr x 7 days a week Shelter Intakes require [employer] Referral EXCEPT under inclement weather protocol Curfew: 8pm  [place]:   [employer] of State  Homeless Referral Contact Ms. Harrel Carina bos@ncceh .org [phone]  [facility] [phone] or housinghelp@[place] .gov Monday - Friday; 10am-4pm Or leave a voicemail/send an email any time  In-person drop-in hours Mondays, 9am - 1pm [facility], [place] and Thursdays, 8:30am - [place], [place]   I also would  encourage you to reach out to [facility] and clarify why they were offering services and what they can offer, their number is [phone]. If for some reason it is not a program you are eligible for I want you to have some additional supports.   [facility] can be a helpful resource while you stay with your brother. They offer primary care, eye care, dental care, mental health and pharmacy resources for uninsured/underinsured patients. They are located at [address], [place], and their phone number is [phone].   Once you have completed the applications I had sent please reach out to me so we can coordinate reviewing them. Oftentimes I have patients bring them to the closed [facility] clinic so they can be sent to me via interoffice to our clinics in [place]. Do not hesitate to reach out if there are any additional questions/concerns.   Michiel Cowboy, [place]

## 2021-03-26 NOTE — Patient Instructions (Signed)
Referral placed to Healthy Weight and Wellness - you will be called to schedule   Continue aspirin 81 mg daily  and atorvastatin 80mg  daily  for secondary stroke prevention  Continue to follow up with PCP regarding cholesterol, blood pressure and diabetes management  Maintain strict control of hypertension with blood pressure goal below 130/90, diabetes with hemoglobin A1c goal below 7% and cholesterol with LDL cholesterol (bad cholesterol) goal below 70 mg/dL.      Followup in the future with me in 6 months or call earlier if needed      Thank you for coming to see [place] at [facility]. I hope we have been able to provide you high quality care today.  You may receive a patient satisfaction survey over the next few weeks. We would appreciate your feedback and comments so that we may continue to improve ourselves and the health of our patients.

## 2021-03-27 NOTE — Progress Notes (Signed)
Remote ICD transmission.   

## 2021-03-27 NOTE — Progress Notes (Signed)
I agree with the above plan 

## 2021-03-28 ENCOUNTER — Encounter: Payer: Self-pay | Admitting: Cardiology

## 2021-03-28 ENCOUNTER — Telehealth: Payer: Self-pay | Admitting: *Deleted

## 2021-03-28 NOTE — Telephone Encounter (Signed)
Called left message for patient to call back about this appointment. Patient may not need to come in today unless she is having some active issues she would like to discuss. According to last OV note with Dr. Quentin Ore in May 2022, patient was to follow up in 9 months.

## 2021-04-03 ENCOUNTER — Telehealth: Payer: Self-pay | Admitting: Licensed Clinical Social Worker

## 2021-04-03 NOTE — Telephone Encounter (Signed)
LCSW sent information regarding support group for women w/ AHF to pt via email.   Westley Hummer, MSW, [place]  [phone]

## 2021-04-04 ENCOUNTER — Telehealth: Payer: Self-pay | Admitting: Licensed Clinical Social Worker

## 2021-04-04 NOTE — Progress Notes (Signed)
Heart and Vascular Care Navigation  04/04/2021  MARIANITA KIRCH 28-Dec-1965 [id number]  Reason for Referral:    Engaged with patient by telephone for follow up visit for Heart and Vascular Care Coordination.                                                                                                   Assessment:                 LCSW received call from pt, she shares that she was able to connect with DSS as this writer had previously advised and clarified the vehicle concern. She has received a letter that she is now approved for Medicaid. Congratulated pt on coverage and that her persistence/coordination had paid off. I shared that I would confirm coverage with our billing department and offered to text once confirmed. Pt interested in this, understands that she should bring this approval letter with her to all appointments moving forward and to the pharmacy. She is still intermittently staying with her brother and his partner, she shares they are heavy smokers and she tries to stay away as best she can. Provided support for pt contentiousness.   Was able to confirm coverage with Hunt Oris, [place] that pt has coverage under [id number] L starting 12/08/20. Pt contacted with this information. She asks and is provided with information regarding upcoming support group.                    HRT/VAS Care Coordination     Patients Home Cardiology Office [facility]   Outpatient Care Team Social Worker   Social Worker Name: Margarito Liner [place], [phone]   Living arrangements for the past 2 months Single Family Home   Lives with: Significant Other; Relatives; Siblings   Patient Current Insurance Coverage Medicaid   Patient Has Concern With Paying Medical Bills No   Patient Concerns With Medical Bills previous hospital bills   Medical Bill Referrals: back paid now by [facility] approval   Does Patient Have Prescription Coverage? Yes   Patient Prescription  Assistance Programs [place] [facility]   [place] [facility] Medications medassist not option now that pt has Medicaid coverage   Home Assistive Devices/Equipment None; CBG Meter       Social History:                                                                             SDOH Screenings   Alcohol Screen: Not on file  Depression (PHQ2-9): Medium Risk   PHQ-2 Score: 5  Financial Resource Strain: High Risk   Difficulty of Paying Living Expenses: Very hard  Food Insecurity: Food Insecurity Present   Worried About Running Out of Food in the Last Year: Sometimes true   Ran Out of Food in the  Last Year: Sometimes true  Housing: Medium Risk   Last Housing Risk Score: 1  Physical Activity: Not on file  Social Connections: Not on file  Stress: Not on file  Tobacco Use: Medium Risk   Smoking Tobacco Use: Former   Smokeless Tobacco Use: Never  Transportation Needs: Unmet Transportation Needs   Lack of Transportation (Medical): No   Lack of Transportation (Non-Medical): Yes      Follow-up plan:   LCSW will f/u next week to remind pt of upcoming support group, remain available as needed moving forward. Pt has my number for any additional questions/concerns.

## 2021-04-07 ENCOUNTER — Other Ambulatory Visit: Payer: Self-pay | Admitting: Internal Medicine

## 2021-04-07 DIAGNOSIS — I10 Essential (primary) hypertension: Secondary | ICD-10-CM

## 2021-04-07 DIAGNOSIS — I5022 Chronic systolic (congestive) heart failure: Secondary | ICD-10-CM

## 2021-04-12 ENCOUNTER — Ambulatory Visit: Payer: Self-pay | Admitting: Internal Medicine

## 2021-04-12 NOTE — Progress Notes (Deleted)
Follow-up Outpatient Visit Date: 04/12/2021  Primary Care Provider: Theotis Burrow, MD [address] [place] [address]  Chief Complaint: ***  HPI:  Stacey Richardson is a 55 y.o. female with history of coronary artery disease with critical left main stenosis status post CABG (LIMA to LAD and RIMA to OM1), chronic HFrEF due to ischemic cardiomyopathy, SVT, strokes, hypertension, hyperlipidemia, type 2 diabetes mellitus, and obesity, who presents for follow-up of coronary artery disease, HFrEF, and stroke.  She was last seen in our office in early March by Laurann Montana, NP, at which time she was anxious and tearful.  From a heart standpoint, she was asymptomatic.  She subsequently underwent SVT ablation and ICD implantation by Dr. Quentin Ore in late March.  She followed up with him once in May, at which time she was doing well.  --------------------------------------------------------------------------------------------------  Past Medical History:  Diagnosis Date   CAD (coronary artery disease)    CABG (07/2020; LIMA-LAD, RIMA-OM1)   CVA (cerebral vascular accident) ([place])    Diabetes mellitus    HFrEF (heart failure with reduced ejection fraction) ([place])    HLD (hyperlipidemia)    Hypertension    SVT (supraventricular tachycardia) ([place])    s/p ablation (11/2020)   Past Surgical History:  Procedure Laterality Date   CARDIAC CATHETERIZATION     CLIPPING OF ATRIAL APPENDAGE N/A 08/06/2020   Procedure: CLIPPING OF ATRIAL APPENDAGE USING ATRICURE CLIP SIZE 40MM;  Surgeon: Wonda Olds, MD;  Location: [facility];  Service: Open Heart Surgery;  Laterality: N/A;   CORONARY ARTERY BYPASS GRAFT N/A 08/06/2020   Procedure: CORONARY ARTERY BYPASS GRAFTING (CABG) X2, USING BILATERAL INTERNAL MAMMARY ARTERIES;  Surgeon: Wonda Olds, MD;  Location: [facility];  Service: Open Heart Surgery;  Laterality: N/A;   ICD IMPLANT N/A 12/05/2020   Procedure: ICD IMPLANT;  Surgeon: Vickie Epley, MD;  Location: [facility] CV [facility];  Service: Cardiovascular;  Laterality: N/A;   KNEE SURGERY     RIGHT/LEFT HEART CATH AND CORONARY ANGIOGRAPHY Bilateral 08/02/2020   Procedure: RIGHT/LEFT HEART CATH AND CORONARY ANGIOGRAPHY;  Surgeon: [name], MD;  Location: [facility] CV LAB;  Service: Cardiovascular;  Laterality: Bilateral;   SVT ABLATION N/A 12/05/2020   Procedure: SVT ABLATION;  Surgeon: Vickie Epley, MD;  Location: [facility] CV LAB;  Service: Cardiovascular;  Laterality: N/A;   TEE WITHOUT CARDIOVERSION N/A 07/18/2020   Procedure: TRANSESOPHAGEAL ECHOCARDIOGRAM (TEE);  Surgeon: [name], MD;  Location: [facility];  Service: Cardiovascular;  Laterality: N/A;   TEE WITHOUT CARDIOVERSION N/A 08/06/2020   Procedure: TRANSESOPHAGEAL ECHOCARDIOGRAM (TEE);  Surgeon: Wonda Olds, MD;  Location: [facility];  Service: Open Heart Surgery;  Laterality: N/A;     No outpatient medications have been marked as taking for the 04/12/21 encounter (Appointment) with Prathik Aman, Harrell Gave, MD.    Allergies: Peanut butter flavor and Lisinopril  Social History   Tobacco Use   Smoking status: Former   Smokeless tobacco: Never   Tobacco comments:    Quit 30 years ago  Vaping Use   Vaping Use: Never used  Substance Use Topics   Alcohol use: Yes    Comment: social   Drug use: No    Family History  Problem Relation Age of Onset   Heart disease Mother        a. ?valve   Heart failure Brother    Hypertension Brother     Review of Systems: A 12-system review of systems was  performed and was negative except as noted in the HPI.  --------------------------------------------------------------------------------------------------  Physical Exam: LMP 11/21/2020   General:  NAD. Neck: No JVD or HJR. Lungs: Clear to auscultation bilaterally without wheezes or crackles. Heart: Regular rate and rhythm without murmurs, rubs, or gallops. Abdomen: Soft, nontender,  nondistended. Extremities: No lower extremity edema.  EKG:  ***  Lab Results  Component Value Date   WBC 7.1 01/21/2021   HGB 10.6 (L) 01/21/2021   HCT 35.8 (L) 01/21/2021   MCV 77.8 (L) 01/21/2021   PLT 294 01/21/2021    Lab Results  Component Value Date   NA 134 (L) 01/21/2021   K 3.6 01/21/2021   CL 102 01/21/2021   CO2 26 01/21/2021   BUN 16 01/21/2021   CREATININE 0.70 01/21/2021   GLUCOSE 240 (H) 01/21/2021   ALT 15 10/08/2020    Lab Results  Component Value Date   CHOL 113 12/11/2020   HDL 41 12/11/2020   LDLCALC 59 12/11/2020   TRIG 60 12/11/2020   CHOLHDL 2.8 12/11/2020    --------------------------------------------------------------------------------------------------  ASSESSMENT AND PLAN: [name], MD 04/12/2021 6:37 AM

## 2021-04-13 ENCOUNTER — Telehealth: Payer: Self-pay | Admitting: Licensed Clinical Social Worker

## 2021-04-13 NOTE — Telephone Encounter (Signed)
LCSW noted that pt current Medicaid is not in network with [facility] (confirmed w/ [place]). I have called pt to discuss how she can either find an in network provider or if she would like to speak with Managed Medicaid about changing into an in network plan. No answer at [phone], left voicemail requesting call back. I remain available.   Westley Hummer, MSW, [place]  [phone]

## 2021-04-19 NOTE — Progress Notes (Signed)
Cardiology Office Note:    Date:  04/20/2021   ID:  QUENIA NUCKOLS, DOB 04-13-1966, MRN [id number]  PCP:  Theotis Burrow, MD  [facility] Cardiologist:  [name], MD  [facility] Electrophysiologist:  Vickie Epley, MD   Referring MD: Theotis Burrow*   Chief Complaint: Overdue 6 week follow-up  History of Present Illness:    Stacey Richardson is a 55 y.o. female with a hx of stroe, HFrEF due to ICM, CAD s/p CABG (LIMA-LAD, RIMA-OM1), HTN, HLD, DM2, obesity, SVT s/p ablation with slow pathway modification and ICD implantation on March 29,2022.   Hospitalized 06/12/20 with acute speech difficulty and dysphagia. Diagnosed with left frontal lobe infarct. Echo showed reduced LVEF 35-40%. Started on coreg, losartan, aspirin, plavix, rosuvastatin.   At follow-up 06/19/20 noted DOE and Zio showed afib/flutter, brief episodes of Wenckebach. Coreg was discontinued.   Ed visit 07/11/20 with MRI with small subacute appearing white matter infarct in left corona radiata new compared to previous, in same vicinity as prior ischemia as well as tiny area of cortical encephalomalacia corresponding to left operculum infarct last month. Reviewed by neurology with notation of subacute stroke recommended for outpatient follow up and to continue aspirin.   Underwent TEE 07/18/20 for evaluation of possible PFO with no evidence of thrombus, EF 30-35%, negative bubble study, though notation of question small tunnel in interatrial septum. She was recommended for [facility] to rule out ischemia as etiology of heart failure which was performed 08/02/20 showing severe ostial LMCA stenosis and mild nonobstructive coronary artery disease of LAD and LCx. She was transferred to [facility] for CABG which was performed 08/06/20 with LIMA-LAD, RIMA-OM1 and left atrial appendage clipping. For further evaluation of possible PFO transcranial doppler with bubbles was recommended and positive. On review by Dr.  Saunders Revel and Dr. Burt Knack, no indication for PFO closure as no clear evidence that PFO exists. At follow up with Dr. Saunders Revel 10/11/20 low dose Losartan as well as Coreg were initiated for optimization of heart failure therapies. She was seen by Dr. Quentin Ore 10/18/20 and loop recorder place and discussion initiated regarding ICD for HFrEF as well as potential EP study due to history of SVT. She had an updated echo 11/08/20 LVEF 30-35%, LV global longitudinal strain -12.2%, mild MR.  Last seen 01/10/21  by EP and was doing well, no shocks and no recurrent SVT.   Today, the patient reports she has occasional lightheadedness. Feels like she is going to pass out. It can happen at any time, happens sometimes when she is sitting. No known triggers. She denies any shocks. Last ICD read with no afib. Dizziness has been going on for many months. Unsure if it is due to dehydration. Also could be menopause. Wondering about heart care support group. She has a fear of living less tan five years. She stopped working all together, she used to work in the [employer] as a Quarry manager. Heart failure diagnosis discussed in detail. She has behavioral health therapist. PCP started escitalopram, but she was afraid to start it, said it was OK to start taking.  No chest pain, SOB, LLE, orthopnea, pnd, palpitations. EKG with NSR and nonspecific T wave changes. Trying to lose weight. Diet and exercise discussed. Wondering about going back to cardiac rehab for heart failure.   Past Medical History:  Diagnosis Date   CAD (coronary artery disease)    CABG (07/2020; LIMA-LAD, RIMA-OM1)   CVA (cerebral vascular accident) ([place])    Diabetes  mellitus    HFrEF (heart failure with reduced ejection fraction) (HCC)    HLD (hyperlipidemia)    Hypertension    SVT (supraventricular tachycardia) (HCC)    s/p ablation (11/2020)    Past Surgical History:  Procedure Laterality Date   CARDIAC CATHETERIZATION     CLIPPING OF ATRIAL APPENDAGE N/A 08/06/2020   Procedure:  CLIPPING OF ATRIAL APPENDAGE USING ATRICURE CLIP SIZE 40MM;  Surgeon: Wonda Olds, MD;  Location: [place];  Service: Open Heart Surgery;  Laterality: N/A;   CORONARY ARTERY BYPASS GRAFT N/A 08/06/2020   Procedure: CORONARY ARTERY BYPASS GRAFTING (CABG) X2, USING BILATERAL INTERNAL MAMMARY ARTERIES;  Surgeon: Wonda Olds, MD;  Location: [place];  Service: Open Heart Surgery;  Laterality: N/A;   ICD IMPLANT N/A 12/05/2020   Procedure: ICD IMPLANT;  Surgeon: Vickie Epley, MD;  Location: [place] [facility];  Service: Cardiovascular;  Laterality: N/A;   KNEE SURGERY     RIGHT/LEFT HEART CATH AND CORONARY ANGIOGRAPHY Bilateral 08/02/2020   Procedure: RIGHT/LEFT HEART CATH AND CORONARY ANGIOGRAPHY;  Surgeon: [name], MD;  Location: [place] [facility];  Service: Cardiovascular;  Laterality: Bilateral;   SVT ABLATION N/A 12/05/2020   Procedure: SVT ABLATION;  Surgeon: Vickie Epley, MD;  Location: [place] [facility];  Service: Cardiovascular;  Laterality: N/A;   TEE WITHOUT CARDIOVERSION N/A 07/18/2020   Procedure: TRANSESOPHAGEAL ECHOCARDIOGRAM (TEE);  Surgeon: [name], MD;  Location: [facility];  Service: Cardiovascular;  Laterality: N/A;   TEE WITHOUT CARDIOVERSION N/A 08/06/2020   Procedure: TRANSESOPHAGEAL ECHOCARDIOGRAM (TEE);  Surgeon: Wonda Olds, MD;  Location: [place];  Service: Open Heart Surgery;  Laterality: N/A;    Current Medications: Current Meds  Medication Sig   acetaminophen (TYLENOL) 500 MG tablet Take 500 mg by mouth every 6 (six) hours as needed for moderate pain or headache.   alum & mag hydroxide-simeth (MAALOX ADVANCED MAX ST) 400-400-40 MG/5ML suspension Take 15 mLs by mouth every 6 (six) hours as needed for indigestion.   aspirin EC 81 MG tablet Take 81 mg by mouth daily.    atorvastatin (LIPITOR) 80 MG tablet Take 80 mg by mouth at bedtime.   carvedilol (COREG) 12.5 MG tablet TAKE 1 TABLET BY MOUTH TWICE A DAY   escitalopram (LEXAPRO)  10 MG tablet Take 10 mg by mouth daily.   JARDIANCE 10 MG TABS tablet Take 10 mg by mouth daily.   sacubitril-valsartan (ENTRESTO) 24-26 MG Take 1 tablet by mouth 2 (two) times daily.   spironolactone (ALDACTONE) 25 MG tablet TAKE 1/2 TABLET BY MOUTH EVERY DAY   tetrahydrozoline 0.05 % ophthalmic solution Place 1-2 drops into both eyes daily as needed (irritated eyes.). visine   [DISCONTINUED] losartan (COZAAR) 25 MG tablet TAKE 1 TABLET (25 MG TOTAL) BY MOUTH DAILY.     Allergies:   Peanut butter flavor and Lisinopril   Social History   Socioeconomic History   Marital status: Divorced    Spouse name: Not on file   Number of children: Not on file   Years of education: Not on file   Highest education level: Not on file  Occupational History   Occupation: on short term disability  Tobacco Use   Smoking status: Former   Smokeless tobacco: Never   Tobacco comments:    Quit 30 years ago  Vaping Use   Vaping Use: Never used  Substance and Sexual Activity   Alcohol use: Yes    Comment: social   Drug  use: No   Sexual activity: Not on file  Other Topics Concern   Not on file  Social History Narrative   Lives with boyfriend   Right Handed   Drinks no caffeine daily   Social Determinants of Health   Financial Resource Strain: High Risk   Difficulty of Paying Living Expenses: Very hard  Food Insecurity: Landscape architect Present   Worried About Charity fundraiser in the Last Year: Sometimes true   Arboriculturist in the Last Year: Sometimes true  Transportation Needs: Unmet Transportation Needs   Lack of Transportation (Medical): No   Lack of Transportation (Non-Medical): Yes  Physical Activity: Not on file  Stress: Not on file  Social Connections: Not on file     Family History: The patient's family history includes Heart disease in her mother; Heart failure in her brother; Hypertension in her brother.  ROS:   Please see the history of present illness.     All other  systems reviewed and are negative.  EKGs/Labs/Other Studies Reviewed:    The following studies were reviewed today:  Limited Echo 11/2020  1. Left ventricular ejection fraction, by estimation, is 30 to 35%. The  left ventricle has moderate to severely decreased function. The average  left ventricular global longitudinal strain is -12.1 %. The global  longitudinal strain is abnormal.   2. The mitral valve is normal in structure. Mild mitral valve  regurgitation.   3. The aortic valve was not well visualized. Aortic valve regurgitation  is not visualized.   Cardiac cath 2021 Conclusions: Severe ostial LMCA stenosis (~80%) with pressure dampening of 35F diagnostic catheter.  There is no significant improvement with intracoronary nitroglycerin. Mild, non-obstructive coronary artery disease involving LAD and dominant LCx. Normal left and right heart filling pressures. Normal Fick cardiac output/index. Right radial artery loop.   Recommendations: Transfer to [facility] for cardiac surgery consultation.  If the patient is not a surgical candidate, her LMCA stenosis is treatable percutaneously, though this would require hemodynamic support given reduced LVEF and left-dominant system. Hold clopidogrel pending cardiac surgery evaluation. Avoid right radial artery access for future catheterization. Aggressive secondary prevention of coronary artery disease.   [name], MD [facility]  Echo 06/2020  1. Left ventricular ejection fraction, by estimation, is 35 to 40%. The  left ventricle has moderately decreased function. Left ventricular  endocardial border not optimally defined to evaluate regional wall motion.  The left ventricular internal cavity  size was mildly dilated. There is mild left ventricular hypertrophy. Left  ventricular diastolic function could not be evaluated.   2. Right ventricular systolic function is normal. The right ventricular  size is normal.   3. The  mitral valve is normal in structure. Mild to moderate mitral valve  regurgitation.   4. The aortic valve has an indeterminant number of cusps. Aortic valve  regurgitation not well assessed.   5. The inferior vena cava is normal in size with greater than 50%  respiratory variability, suggesting right atrial pressure of 3 mmHg.     EKG:  EKG is ordered today.  The ekg ordered today demonstrates NSR, 71bpm, nonspecific  T wave changes, TWI lateral leads  Recent Labs: 06/12/2020: TSH 1.251 07/20/2020: B Natriuretic Peptide 26.5 10/08/2020: ALT 15 12/01/2020: Magnesium 1.5 01/21/2021: BUN 16; Creatinine, Ser 0.70; Hemoglobin 10.6; Platelets 294; Potassium 3.6; Sodium 134  Recent Lipid Panel    Component Value Date/Time   CHOL 113 12/11/2020 1133   TRIG 60  12/11/2020 1133   HDL 41 12/11/2020 1133   CHOLHDL 2.8 12/11/2020 1133   CHOLHDL 3.3 08/03/2020 0042   VLDL 12 08/03/2020 0042   LDLCALC 59 12/11/2020 1133     Physical Exam:    VS:  BP 128/70 (BP Location: Left Arm, Patient Position: Sitting, Cuff Size: Normal)   Pulse 71   Ht '5\' 6"'$  (1.676 m)   Wt 192 lb 4 oz (87.2 kg)   LMP 11/21/2020   SpO2 98%   BMI 31.03 kg/m     Wt Readings from Last 3 Encounters:  04/20/21 192 lb 4 oz (87.2 kg)  03/26/21 197 lb (89.4 kg)  03/05/21 191 lb 12.8 oz (87 kg)     GEN:  Well nourished, well developed in no acute distress HEENT: Normal NECK: No JVD; No carotid bruits LYMPHATICS: No lymphadenopathy CARDIAC: RRR, no murmurs, rubs, gallops RESPIRATORY:  Clear to auscultation without rales, wheezing or rhonchi  ABDOMEN: Soft, non-tender, non-distended MUSCULOSKELETAL:  No edema; No deformity  SKIN: Warm and dry NEUROLOGIC:  Alert and oriented x 3 PSYCHIATRIC:  Normal affect   ASSESSMENT:    1. HFrEF (heart failure with reduced ejection fraction) ([place])   2. Acute combined systolic and diastolic congestive heart failure ([place])   3. Medication management   4. Essential hypertension   5.  Dizziness   6. Hyperlipidemia associated with type 2 diabetes mellitus ([place])   7. Chronic systolic heart failure ([place])   8. ICD (implantable cardioverter-defibrillator) in place   9. Cryptogenic stroke ([place])   10. S/P CABG x 2   11. Cardiomyopathy, unspecified type ([place])    PLAN:    In order of problems listed above:  HFrEF ICM s/p ICD LVEF 30-35% by echo 11/2020. She is euvolemic on exam. Long discussion regarding heart failure diagnosis and treatment. Patient has a lot of fears regarding overall health. We will try and refer her back to cardiac rehab for heart failure. BP and HR good today. Continue Coreg, Farxiga, Spironolactone. I will stop Losartan and start Entresto 24-'26mg'$ BID. BMET in a week. She will eventually need repeat echo to assess pump function, can order at follow-up. Shje follows with EP for ICD management.   CAD s/p CABG Patient denies anginal symptoms. Exercise and diet discussed at length today. Patient wanting to make lifestyle changes and lose weight. Continue Aspirin, BB, statin.   Cryptogenic stroke She follows with neurology. Continue Aspirin, plavix, and statin.   HTN BP good. Transition Losartan to [facility] as above. Continue other antihypertensive medications.   HLD LDL 60 07/2020. Continue Atorvastatin.   SVT s/p SVT ablation Patient denies palpitations. Follows with EP.  Dizziness Unclear etiology, says it has been occurring for several months now. Possibly dehydration, menopause, does not appear to be orthostatic. Also follows with Neurology. Recommended she send a ICD report from home. No afib noted on last report. No shocks administered. BP and heart rate good today. Recommended she check vitals during a dizzy spell. Also stay well hydrated.  Disposition: Follow up in 3 month(s) with MD/APP    Signed, Ania Levay Ninfa Meeker, PA-C  04/20/2021 2:37 PM    [facility]

## 2021-04-20 ENCOUNTER — Ambulatory Visit (INDEPENDENT_AMBULATORY_CARE_PROVIDER_SITE_OTHER): Payer: Self-pay | Admitting: Medical

## 2021-04-20 ENCOUNTER — Other Ambulatory Visit: Payer: Self-pay

## 2021-04-20 ENCOUNTER — Encounter: Payer: Self-pay | Admitting: Medical

## 2021-04-20 ENCOUNTER — Telehealth: Payer: Self-pay | Admitting: Licensed Clinical Social Worker

## 2021-04-20 VITALS — BP 128/70 | HR 71 | Ht 66.0 in | Wt 192.2 lb

## 2021-04-20 DIAGNOSIS — E785 Hyperlipidemia, unspecified: Secondary | ICD-10-CM

## 2021-04-20 DIAGNOSIS — E1169 Type 2 diabetes mellitus with other specified complication: Secondary | ICD-10-CM

## 2021-04-20 DIAGNOSIS — Z951 Presence of aortocoronary bypass graft: Secondary | ICD-10-CM

## 2021-04-20 DIAGNOSIS — R42 Dizziness and giddiness: Secondary | ICD-10-CM

## 2021-04-20 DIAGNOSIS — I639 Cerebral infarction, unspecified: Secondary | ICD-10-CM

## 2021-04-20 DIAGNOSIS — Z95810 Presence of automatic (implantable) cardiac defibrillator: Secondary | ICD-10-CM

## 2021-04-20 DIAGNOSIS — I5041 Acute combined systolic (congestive) and diastolic (congestive) heart failure: Secondary | ICD-10-CM

## 2021-04-20 DIAGNOSIS — I5022 Chronic systolic (congestive) heart failure: Secondary | ICD-10-CM

## 2021-04-20 DIAGNOSIS — I10 Essential (primary) hypertension: Secondary | ICD-10-CM

## 2021-04-20 DIAGNOSIS — I5020 Unspecified systolic (congestive) heart failure: Secondary | ICD-10-CM

## 2021-04-20 DIAGNOSIS — I429 Cardiomyopathy, unspecified: Secondary | ICD-10-CM

## 2021-04-20 DIAGNOSIS — Z79899 Other long term (current) drug therapy: Secondary | ICD-10-CM

## 2021-04-20 MED ORDER — ENTRESTO 24-26 MG PO TABS: 1.0000 | ORAL_TABLET | Freq: Two times a day (BID) | ORAL | 3 refills | Status: DC

## 2021-04-20 NOTE — Telephone Encounter (Signed)
LCSW called this writer back to let me know that she was able to get her Medicaid plan transitioned to [facility] which is in network with [employer]. It will be active 9/1- I encouraged her to let offices know this when she checks in and then if any issues with bills after coverage change to contact [employer]. No additional questions/concerns at this time- pt encouraged to call me if any additional needs arise.   Westley Hummer, MSW, [place]  [phone]

## 2021-04-20 NOTE — Patient Instructions (Addendum)
Medication Instructions:  Please START Entresto 24-26 mg  STOP Losartan  *If you need a refill on your cardiac medications before your next appointment, please call your pharmacy*   Lab Work: BMET ( 1week) Walk into medical mall at the check in desk, they will direct you to lab registration, hours for labs are Monday-Friday 07:00am-5:30pm (no appointment necessary)  LABS WILL APPEAR ON MYCHART, ABNORMAL RESULTS WILL BE CALLED  Testing/Procedures: None   Follow-Up: At [facility], you and your health needs are our priority.  As part of our continuing mission to provide you with exceptional heart care, we have created designated Provider Care Teams.  These Care Teams include your primary Cardiologist (physician) and Advanced Practice Providers (APPs -  Physician Assistants and Nurse Practitioners) who all work together to provide you with the care you need, when you need it.  Your next appointment:   3 month(s)  The format for your next appointment:   In Person  Provider:   [name], MD or Stacey Richardson, [place]   Other Instructions We have referred you back to cardiac rehab, they should call you for an appointment soon or you may call them since you have been referred out to them before.

## 2021-04-20 NOTE — Telephone Encounter (Signed)
LCSW spoke with pt, she was returning my call regarding her Medicaid.  She shares that she likes her primary care and [facility] providers and would like to find a plan in network. LCSW directed pt to call the line for Managed Medicaid and see how they can assist her. If she runs into any issues then she can give me a call back.   Westley Hummer, MSW, [place]  [phone]

## 2021-04-23 ENCOUNTER — Other Ambulatory Visit: Payer: Self-pay | Admitting: Internal Medicine

## 2021-04-23 ENCOUNTER — Other Ambulatory Visit: Payer: Self-pay | Admitting: *Deleted

## 2021-04-23 DIAGNOSIS — I5022 Chronic systolic (congestive) heart failure: Secondary | ICD-10-CM

## 2021-04-23 DIAGNOSIS — I10 Essential (primary) hypertension: Secondary | ICD-10-CM

## 2021-04-23 DIAGNOSIS — I5040 Unspecified combined systolic (congestive) and diastolic (congestive) heart failure: Secondary | ICD-10-CM

## 2021-04-25 ENCOUNTER — Telehealth: Payer: Self-pay | Admitting: Internal Medicine

## 2021-04-25 NOTE — Telephone Encounter (Signed)
Patient calling  States she has yet to start [facility] because they are waiting to have it approved Would like to know if she still needs to have lab work completed today or wait til after taking medication Please call to discuss

## 2021-04-25 NOTE — Telephone Encounter (Signed)
Patient calling back  [facility] ADVISED PRE APPROVAL / PRIOR AUTH is required .

## 2021-04-25 NOTE — Telephone Encounter (Signed)
I spoke with the patient.  She advised that she has not started entresto yet as she does not have the RX. She states she has received a call from [facility] about needing updated insurance information, but she has not called them back. I asked her to please call [facility] so they can update what they need on there side. She is aware that once they process the RX through her insurance, this may also trigger a prior authorization request that we will have to then follow up with.  She is aware that that lab work she was due to get today was to follow up on the entresto initiation. I have asked her to please mark on her calendar to have her lab work checked at the [facility] 1 week after she actually starts entresto.  The patient voices understanding and is agreeable.   She advised she will call [facility] now.

## 2021-04-26 NOTE — Telephone Encounter (Signed)
Prior Auth form for Entresto 24-'26mg'$  tablets completed and faxed to [place] with attached office note from 04/20/21. Waiting for determination.

## 2021-05-03 NOTE — Telephone Encounter (Signed)
Called [name] [employer] to check the states of [place] for [place].   Stacey Richardson was approved.  Approval dates -- 04/26/2021 through 04/21/2022

## 2021-05-11 ENCOUNTER — Encounter: Payer: Medicaid Other | Attending: Internal Medicine | Admitting: *Deleted

## 2021-05-11 ENCOUNTER — Other Ambulatory Visit: Payer: Self-pay

## 2021-05-11 DIAGNOSIS — I5040 Unspecified combined systolic (congestive) and diastolic (congestive) heart failure: Secondary | ICD-10-CM | POA: Insufficient documentation

## 2021-05-11 NOTE — Progress Notes (Signed)
Initial telephone orientation completed. Diagnosis can be found in [facility] 8/12. EP orientation scheduled Monday 9/19 at 9am.

## 2021-05-15 ENCOUNTER — Telehealth: Payer: Self-pay | Admitting: Licensed Clinical Social Worker

## 2021-05-15 NOTE — Telephone Encounter (Signed)
LCSW received a call from pt over the weekend when clinic was closed requesting a ride to support group tomorrow. LCSW has sent in referral for ride to [employer]. I have sent this confirmation of referral to pt along with the number for Transportation Services so that she can call directly to confirm ride if she doesn't hear from them this afternoon.   Westley Hummer, MSW, [place]  [phone]

## 2021-05-16 ENCOUNTER — Telehealth (HOSPITAL_COMMUNITY): Payer: Self-pay | Admitting: Licensed Clinical Social Worker

## 2021-05-16 NOTE — Telephone Encounter (Signed)
Pt attended and participated in [facility] Women's Support Group.   ? ?Keeli Roberg H. Briaunna Grindstaff, LCSW ?Clinical Social Worker ?[facility] ?Desk#: [phone] ?Cell#: [phone] ? ?

## 2021-06-06 ENCOUNTER — Ambulatory Visit (INDEPENDENT_AMBULATORY_CARE_PROVIDER_SITE_OTHER): Payer: Medicaid Other

## 2021-06-06 DIAGNOSIS — I255 Ischemic cardiomyopathy: Secondary | ICD-10-CM

## 2021-06-06 IMAGING — CR DG CHEST 2V
2 series · 2 of 2 positions shown · non-contrast
Comparison: 07/20/2020

CLINICAL DATA: Preoperative exam for CABG. Coronary artery disease.

EXAM:
CHEST - 2 VIEW

[chest pa]
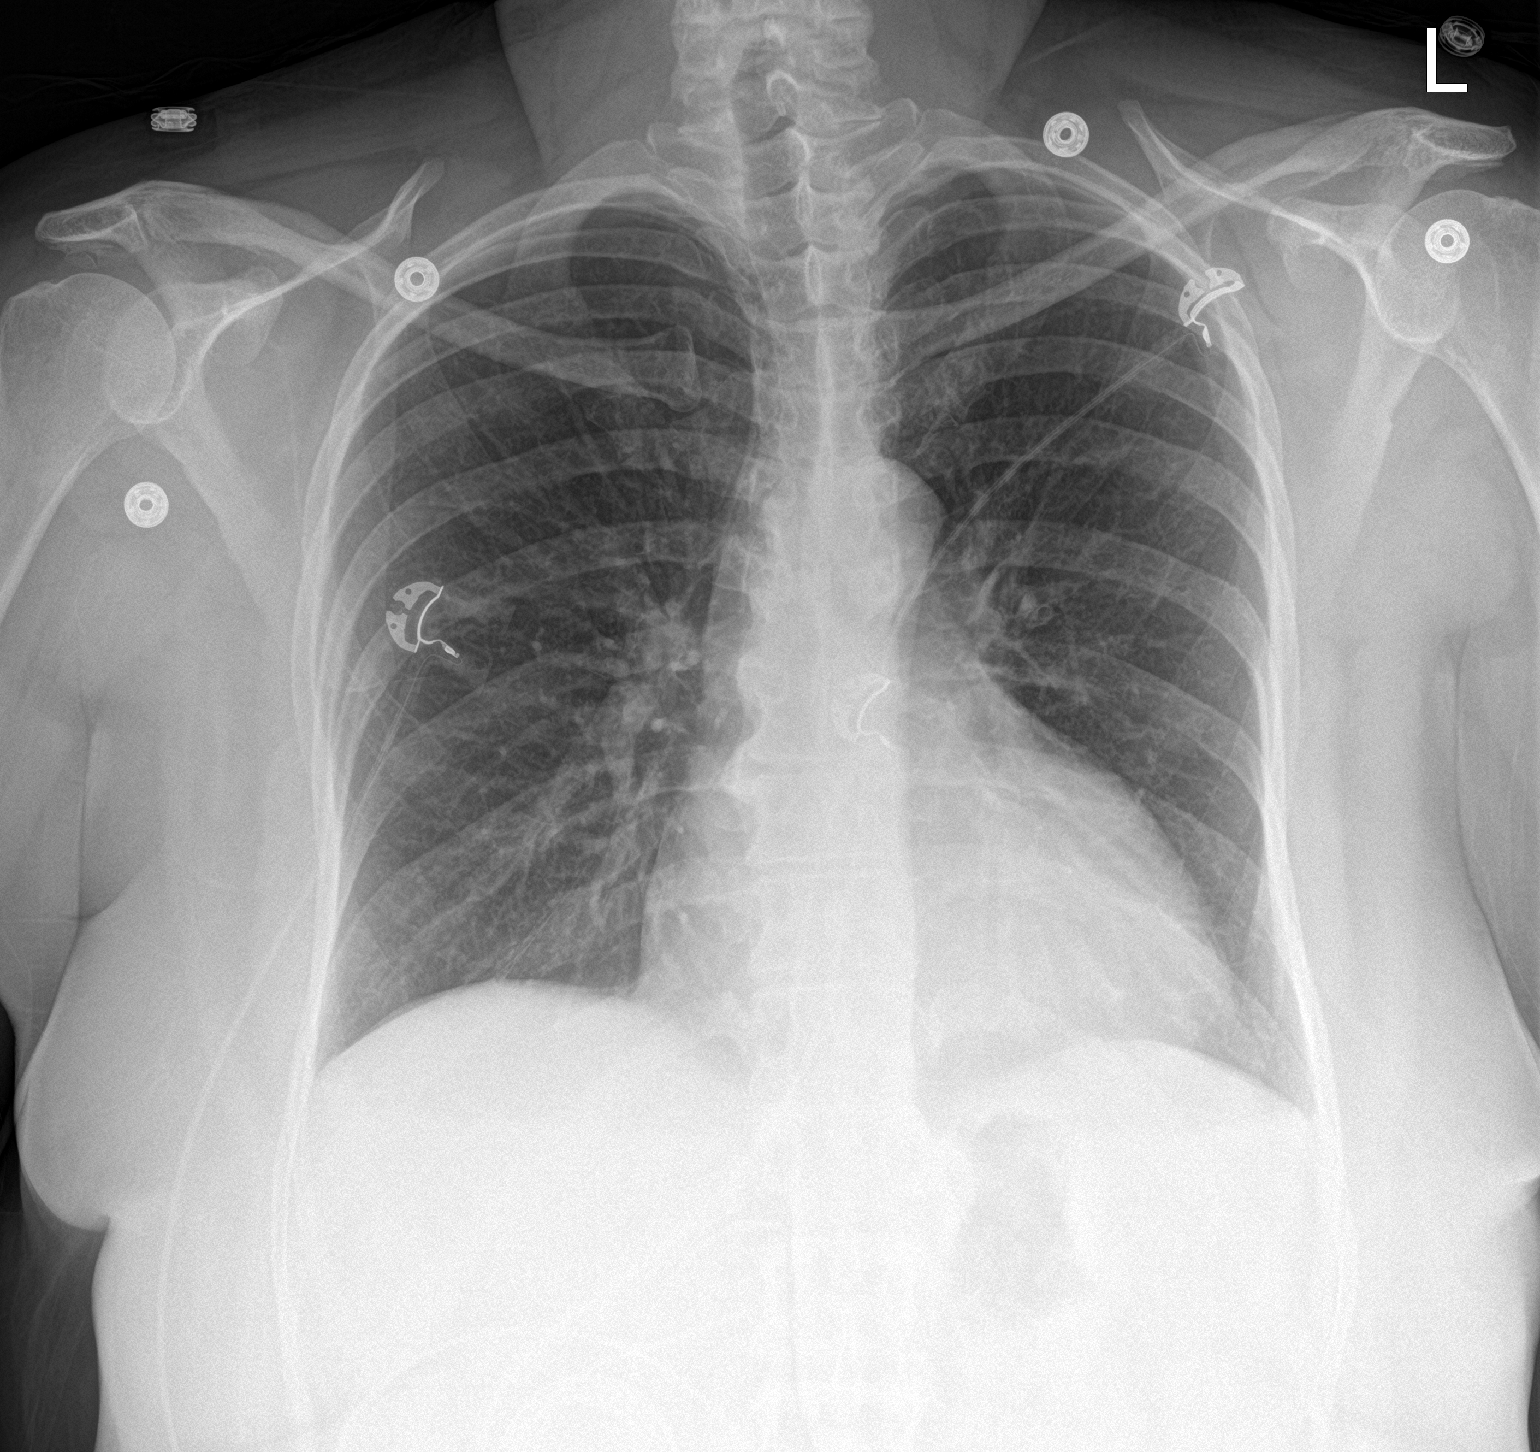

[chest lat]
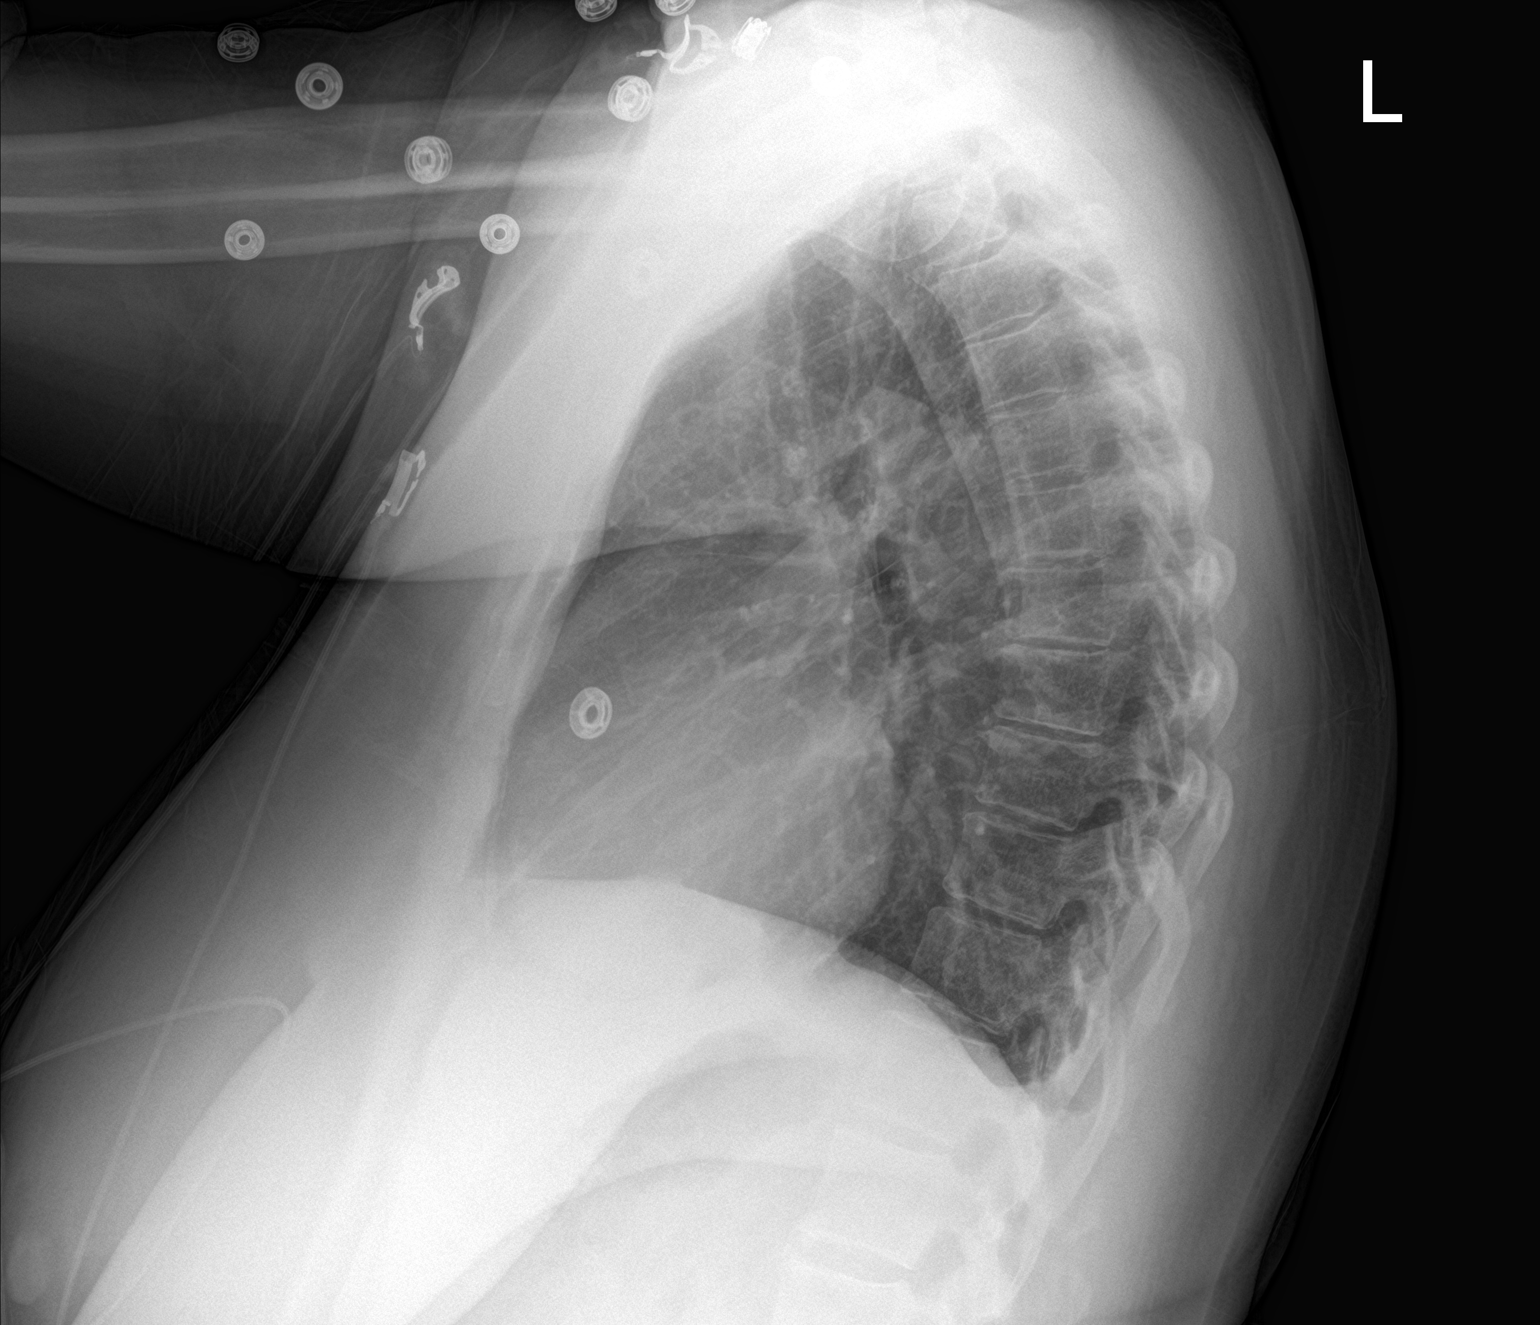

[2 of 2 positions shown; findings below may reference images not displayed]

FINDINGS: The heart size and mediastinal contours are within normal limits.
Both lungs are clear. The visualized skeletal structures are
unremarkable.
IMPRESSION: No active cardiopulmonary disease.

## 2021-06-07 ENCOUNTER — Telehealth: Payer: Self-pay | Admitting: Medical

## 2021-06-07 LAB — CUP PACEART REMOTE DEVICE CHECK
Date Time Interrogation Session: 20220928084125
Implantable Lead Implant Date: 20220329
Implantable Lead Location: 753860
Implantable Lead Model: 436909
Implantable Lead Serial Number: 81437635
Implantable Pulse Generator Implant Date: 20220329
Pulse Gen Model: 429525
Pulse Gen Serial Number: 84824217

## 2021-06-07 IMAGING — DX DG CHEST 1V PORT
1 series · 1 of 1 positions shown · non-contrast
Comparison: August 05, 2020

CLINICAL DATA: Hypoxia. Status post coronary artery bypass grafting

EXAM:
PORTABLE CHEST 1 VIEW

[chest ap]
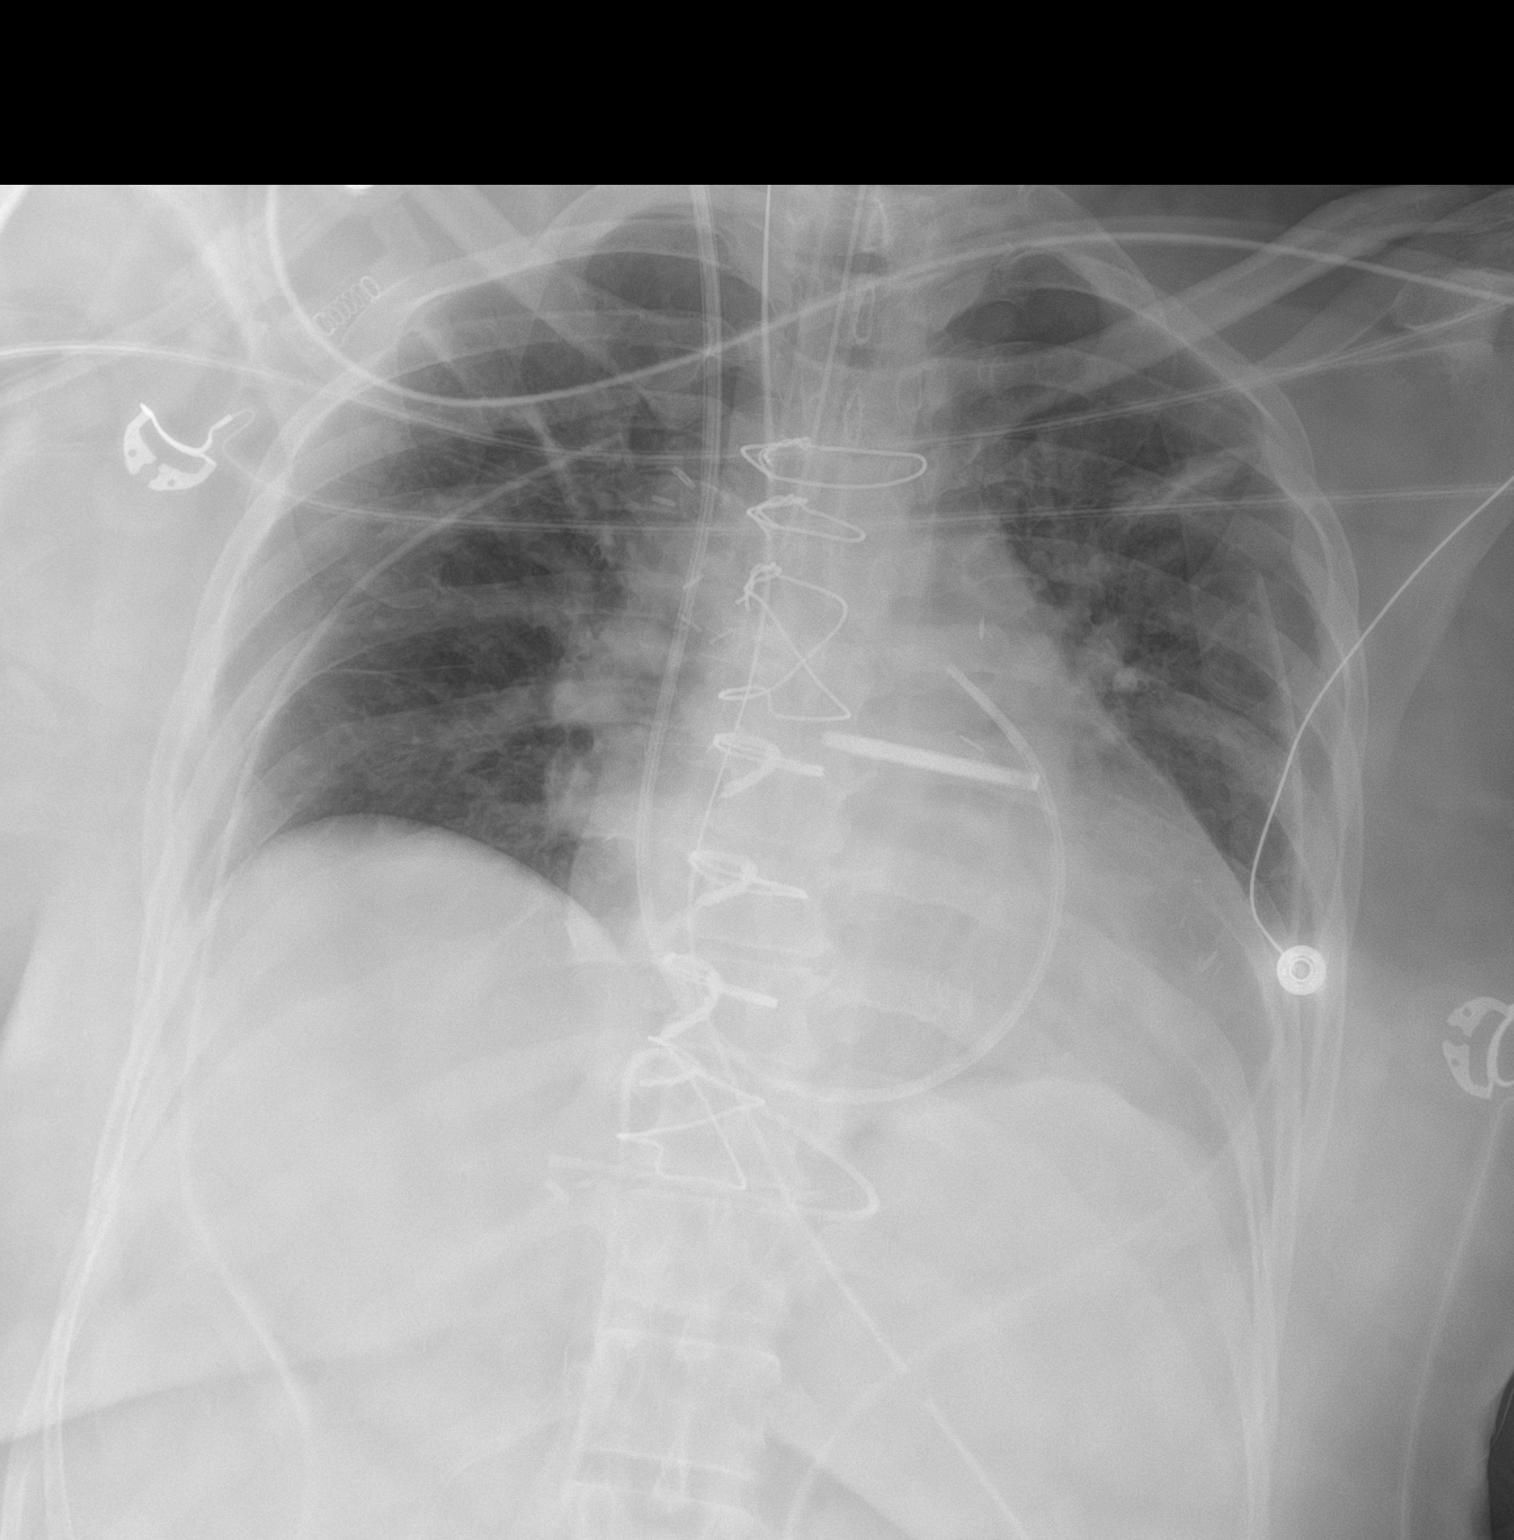

[1 of 1 positions shown; findings below may reference images not displayed]

FINDINGS: Endotracheal tube tip is 1.3 cm above the carina. Nasogastric tube
tip and side port are below the diaphragm. Swan-Ganz catheter tip is
in the proximal right main pulmonary outflow tract. There is a chest
tube on each side. There is a mediastinal drain. No pneumothorax.
There is atelectatic change in the each upper lobe. Lungs elsewhere
are clear. Heart size and pulmonary vascularity are normal. Patient
is status post median sternotomy with apparent internal mammary
bypass grafting. Left atrial appendage clamp present.
IMPRESSION: Tube and catheter positions as described without evident
pneumothorax. Atelectatic change in each upper lobe. Heart upper
normal in size with postoperative changes.

## 2021-06-07 NOTE — Telephone Encounter (Addendum)
*  STAT* If patient is at the pharmacy, call can be transferred to refill team.   1. Which medications need to be refilled? (please list name of each medication and dose if known)   Entresto   2. Which pharmacy/location (including street and city if local pharmacy) is medication to be sent to? [facility]    3. Do they need a 30 day or 90 day supply? [place] closed

## 2021-06-08 ENCOUNTER — Other Ambulatory Visit: Payer: Self-pay | Admitting: *Deleted

## 2021-06-08 ENCOUNTER — Telehealth: Payer: Self-pay

## 2021-06-08 MED ORDER — ENTRESTO 24-26 MG PO TABS: 1.0000 | ORAL_TABLET | Freq: Two times a day (BID) | ORAL | 0 refills | Status: DC

## 2021-06-08 NOTE — Telephone Encounter (Signed)
Covermymeds.com KEY: [id number]  Entresto 24mg -26mg  tablets approved   [place] Case: [id number], Status: Approved, Coverage Starts on: 06/08/2021 12:00:00 AM, Coverage Ends on: 06/08/2022 12:00:00 AM.

## 2021-06-08 NOTE — Telephone Encounter (Signed)
Stacey Richardson [facility] [place]

## 2021-06-11 ENCOUNTER — Telehealth: Payer: Self-pay | Admitting: Internal Medicine

## 2021-06-11 DIAGNOSIS — Z79899 Other long term (current) drug therapy: Secondary | ICD-10-CM

## 2021-06-11 DIAGNOSIS — I5020 Unspecified systolic (congestive) heart failure: Secondary | ICD-10-CM

## 2021-06-11 NOTE — Telephone Encounter (Signed)
Attempted to call the patient. No answer- I left a message to please call back.  

## 2021-06-11 NOTE — Telephone Encounter (Signed)
The patient called back. 1) She advised that she finally had her entresto approved and could not remember which medication she would need to stop when she started this.  I advised her per [place], PA's 04/20/21 office note, she would need to stop losartan when starting entresto. The patient did take a dose of losartan today. I advised that she could start entresto tomorrow in place of losartan. She is aware she should have a follow up BMP in 1 week.  She is due to have Cardiac Rehab orientation on 06/13/21. I have advised her to call back when she know when her exercise dates will be and we can have her come in for labs in the office on a day she will be here for rehab.  The patient voices understanding and is agreeable.   2) The patient was also inquiring if she could have laser treatment to her abdominal area through [facility]. I advised I was unsure of the answer to this from a cardiac perspective, but for general cardiology this may be ok, but with her device, I could not speak to this. I have advised I will forward to her primary cardiologist and the device clinic team to weigh in.  She was unsure how many sessions of laser treatment she would need.  She is aware we will call back with provider recommendations.

## 2021-06-11 NOTE — Telephone Encounter (Signed)
Pt c/o medication issue:  1. Name of Medication: sacubitril-valsartan (ENTRESTO) 24-26 MG  2. How are you currently taking this medication (dosage and times per day)? Has not started  3. Are you having a reaction (difficulty breathing--STAT)? no  4. What is your medication issue? Patient states she was told she would need to stop a medication when she starts the entresto, but it's been months and she cannot remember. She states the entresto is finally ready, but she does not know what medication to stop. She also would like to know if she can have a laser done on her stomach fat.

## 2021-06-12 ENCOUNTER — Telehealth: Payer: Self-pay | Admitting: Licensed Clinical Social Worker

## 2021-06-12 NOTE — Addendum Note (Signed)
Addended byAlvis Lemmings C on: 06/12/2021 08:17 AM   Modules accepted: Orders

## 2021-06-12 NOTE — Telephone Encounter (Signed)
I agree with stopping losartan and starting Entresto as previously prescribed when she would be due for her next dose of losartan.  In regard to [facility], I am not familiar with details of this cosmetic procedure.  Given her complicated cardiovascular history, I would be reluctant for Ms. Haese to undergo elective cosmetic procedures at this time.  [name], MD [facility]

## 2021-06-12 NOTE — Telephone Encounter (Signed)
Attempted to call the patient. No answer- I left a detailed message of Dr. Darnelle Bos response/ recommendations in regards to the laser treatment at this time (ok per DPR).  I asked that she call back with any questions/ concerns.

## 2021-06-12 NOTE — Telephone Encounter (Signed)
LCSW sent pt a reminder of [facility] tomorrow, appears she may have scheduling conflict with first session of cardiac rehab. Encouraged her to let me know if any additional questions/concerns.   Westley Hummer, MSW, [place]  [phone]

## 2021-06-13 ENCOUNTER — Encounter: Payer: Medicaid Other | Attending: Internal Medicine

## 2021-06-13 DIAGNOSIS — I5040 Unspecified combined systolic (congestive) and diastolic (congestive) heart failure: Secondary | ICD-10-CM | POA: Insufficient documentation

## 2021-06-13 NOTE — Progress Notes (Signed)
Remote ICD transmission.   

## 2021-06-13 NOTE — Telephone Encounter (Signed)
From a device standpoint, the cosmetic procedure would need to be at least 6inches away from her device.  It appears Dr. Saunders Revel is already recommending against cosmetic procedure.    Left VM for pt with device clinic # advising to call back if she has any questions.

## 2021-06-28 ENCOUNTER — Telehealth: Payer: Self-pay | Admitting: Licensed Clinical Social Worker

## 2021-06-28 NOTE — Telephone Encounter (Signed)
LCSW attempted to reach pt this morning for check in. No answer, was able to leave voicemail at [phone]. Noted pt no showed cardiac rehab appt.   Westley Hummer, MSW, [place]  [phone]

## 2021-06-29 ENCOUNTER — Telehealth: Payer: Self-pay | Admitting: Licensed Clinical Social Worker

## 2021-06-29 NOTE — Telephone Encounter (Signed)
LCSW received call back from pt. We discussed that pt had missed support group and no showed at Cardiac Rehab. Pt initially upset asking "what does that have to do with you?" I explained that we are part of a larger Heart and Vascular care navigation team and that we want to ensure that pts do not face barriers with accessing services such as cardiac rehab. Pt then states that she thinks it was too early and that is why she missed it and when prompted to call them and discuss other options she states that she is "too embarrassed to call them." Provided verbal support that staff is understanding as long as we are updated if pt having challenges/cant make appts. I encouraged her to come to support group next month- she is enrolled in [employer] and is capable/aware of how to arrange transportation- she states she is interested.   I then called [facility] Cardiac Rehab program, they share that pt has started and stopped the program multiple times, no showed multiple times; but they would attempt to call pt again to reschedule her orientation.   Westley Hummer, MSW, [place]  [phone]

## 2021-07-09 ENCOUNTER — Telehealth (HOSPITAL_COMMUNITY): Payer: Self-pay | Admitting: Licensed Clinical Social Worker

## 2021-07-09 NOTE — Telephone Encounter (Signed)
CSW called pt to inform of [facility]- unable to reach- left VM informing of time/date/location and encouraging her to call with any questions  Jorge Ny, [facility] Desk#: [phone] Cell#: [phone]

## 2021-07-18 ENCOUNTER — Other Ambulatory Visit: Payer: Self-pay

## 2021-07-18 ENCOUNTER — Encounter (HOSPITAL_COMMUNITY): Payer: Self-pay | Admitting: *Deleted

## 2021-07-18 ENCOUNTER — Emergency Department (HOSPITAL_COMMUNITY): Payer: Medicaid Other

## 2021-07-18 ENCOUNTER — Emergency Department (HOSPITAL_COMMUNITY)
Admission: EM | Admit: 2021-07-18 | Discharge: 2021-07-19 | Disposition: A | Discharge status: Home / Self Care | Payer: Medicaid Other | Attending: Emergency Medicine | Admitting: Emergency Medicine

## 2021-07-18 DIAGNOSIS — I509 Heart failure, unspecified: Secondary | ICD-10-CM | POA: Diagnosis not present

## 2021-07-18 DIAGNOSIS — I2510 Atherosclerotic heart disease of native coronary artery without angina pectoris: Secondary | ICD-10-CM | POA: Insufficient documentation

## 2021-07-18 DIAGNOSIS — Z7982 Long term (current) use of aspirin: Secondary | ICD-10-CM | POA: Diagnosis not present

## 2021-07-18 DIAGNOSIS — I110 Hypertensive heart disease with heart failure: Secondary | ICD-10-CM | POA: Insufficient documentation

## 2021-07-18 DIAGNOSIS — E119 Type 2 diabetes mellitus without complications: Secondary | ICD-10-CM | POA: Insufficient documentation

## 2021-07-18 DIAGNOSIS — Z79899 Other long term (current) drug therapy: Secondary | ICD-10-CM | POA: Insufficient documentation

## 2021-07-18 DIAGNOSIS — Z7984 Long term (current) use of oral hypoglycemic drugs: Secondary | ICD-10-CM | POA: Diagnosis not present

## 2021-07-18 DIAGNOSIS — Z87891 Personal history of nicotine dependence: Secondary | ICD-10-CM | POA: Insufficient documentation

## 2021-07-18 DIAGNOSIS — R42 Dizziness and giddiness: Secondary | ICD-10-CM | POA: Diagnosis not present

## 2021-07-18 DIAGNOSIS — Z951 Presence of aortocoronary bypass graft: Secondary | ICD-10-CM | POA: Insufficient documentation

## 2021-07-18 DIAGNOSIS — R55 Syncope and collapse: Secondary | ICD-10-CM | POA: Diagnosis not present

## 2021-07-18 LAB — BASIC METABOLIC PANEL
Anion gap: 10 (ref 5–15)
BUN: 11 mg/dL (ref 6–20)
CO2: 21 mmol/L — ABNORMAL LOW (ref 22–32)
Calcium: 9.4 mg/dL (ref 8.9–10.3)
Chloride: 104 mmol/L (ref 98–111)
Creatinine, Ser: 0.62 mg/dL (ref 0.44–1.00)
GFR, Estimated: >60 mL/min (ref >60)
Glucose, Bld: 130 mg/dL — ABNORMAL HIGH (ref 70–99)
Potassium: 4.1 mmol/L (ref 3.5–5.1)
Sodium: 135 mmol/L (ref 135–145)

## 2021-07-18 LAB — CBC WITH DIFFERENTIAL/PLATELET
Abs Immature Granulocytes: 0.03 10*3/uL (ref 0.00–0.07)
Basophils Absolute: 0.1 10*3/uL (ref 0.0–0.1)
Basophils Relative: 1 %
Eosinophils Absolute: 0.3 10*3/uL (ref 0.0–0.5)
Eosinophils Relative: 5 %
HCT: 47 % — ABNORMAL HIGH (ref 36.0–46.0)
Hemoglobin: 14.8 g/dL (ref 12.0–15.0)
Immature Granulocytes: 0 %
Lymphocytes Relative: 26 %
Lymphs Abs: 1.8 10*3/uL (ref 0.7–4.0)
MCH: 26.9 pg (ref 26.0–34.0)
MCHC: 31.5 g/dL (ref 30.0–36.0)
MCV: 85.3 fL (ref 80.0–100.0)
Monocytes Absolute: 0.5 10*3/uL (ref 0.1–1.0)
Monocytes Relative: 8 %
Neutro Abs: 4 10*3/uL (ref 1.7–7.7)
Neutrophils Relative %: 60 %
Platelets: 305 10*3/uL (ref 150–400)
RBC: 5.51 MIL/uL — ABNORMAL HIGH (ref 3.87–5.11)
RDW: 15.5 % (ref 11.5–15.5)
WBC: 6.7 10*3/uL (ref 4.0–10.5)
nRBC: 0 % (ref 0.0–0.2)

## 2021-07-18 LAB — TROPONIN I (HIGH SENSITIVITY)
Troponin I (High Sensitivity): <2 ng/L (ref <18)
Troponin I (High Sensitivity): 4 ng/L (ref <18)

## 2021-07-18 NOTE — ED Provider Notes (Signed)
[facility] Provider Note   CSN: [id number] Arrival date & time: 07/18/21  1641     History Chief Complaint  Patient presents with   Near Syncope    Stacey Richardson is a 55 y.o. female with a history including CAD with CABG 07/2020, DM, CHF HTN, h/o SVT s/p ablation, ICD placement, presenting with a near syncopal event.  She was sitting at her home studying when she felt lightheaded with general weakness lasting several minutes.  She reports prior episodes of similar symptoms and her cardiologist has not found cause, prior episode felt possibly due to dehydration or menopausal sx per pt record. Her ICD report after her last episode was reviewed (04/20/21) and was neg for events.  She denies shocks from her device today.  She denies having CP, sob, n/v, palpitations and she is currently sx free, this episode resolved spontaneously.   The history is provided by the patient.      Past Medical History:  Diagnosis Date   CAD (coronary artery disease)    CABG (07/2020; LIMA-LAD, RIMA-OM1)   CVA (cerebral vascular accident) ([place])    Diabetes mellitus    HFrEF (heart failure with reduced ejection fraction) ([place])    HLD (hyperlipidemia)    Hypertension    SVT (supraventricular tachycardia) ([place])    s/p ablation (11/2020)    Patient Active Problem List   Diagnosis Date Noted   Ischemic cardiomyopathy 10/11/2020   Hyperlipidemia associated with type 2 diabetes mellitus ([place]) 10/11/2020   CAD in native artery 08/02/2020   Chronic HFrEF (heart failure with reduced ejection fraction) ([place]) 07/26/2020   Lightheadedness 07/26/2020   HFrEF (heart failure with reduced ejection fraction) ([place]) 06/20/2020   Cardiomyopathy ([place]) 06/20/2020   Hyperlipidemia LDL goal <70 06/20/2020   Hyperglycemia    Cryptogenic stroke ([place])    TIA (transient ischemic attack) 06/12/2020   Multifocal pneumonia 01/22/2019   Type 2 diabetes mellitus without complication ([place])    Essential  hypertension     Past Surgical History:  Procedure Laterality Date   CARDIAC CATHETERIZATION     CLIPPING OF ATRIAL APPENDAGE N/A 08/06/2020   Procedure: CLIPPING OF ATRIAL APPENDAGE USING ATRICURE CLIP SIZE 40MM;  Surgeon: [place] Olds, MD;  Location: [place];  Service: Open Heart Surgery;  Laterality: N/A;   CORONARY ARTERY BYPASS GRAFT N/A 08/06/2020   Procedure: CORONARY ARTERY BYPASS GRAFTING (CABG) X2, USING BILATERAL INTERNAL MAMMARY ARTERIES;  Surgeon: [place] Olds, MD;  Location: [place];  Service: Open Heart Surgery;  Laterality: N/A;   ICD IMPLANT N/A 12/05/2020   Procedure: ICD IMPLANT;  Surgeon: Vickie Epley, MD;  Location: [place] [facility];  Service: Cardiovascular;  Laterality: N/A;   KNEE SURGERY     RIGHT/LEFT HEART CATH AND CORONARY ANGIOGRAPHY Bilateral 08/02/2020   Procedure: RIGHT/LEFT HEART CATH AND CORONARY ANGIOGRAPHY;  Surgeon: [name], MD;  Location: [place] [facility];  Service: Cardiovascular;  Laterality: Bilateral;   SVT ABLATION N/A 12/05/2020   Procedure: SVT ABLATION;  Surgeon: Vickie Epley, MD;  Location: [place] [facility];  Service: Cardiovascular;  Laterality: N/A;   TEE WITHOUT CARDIOVERSION N/A 07/18/2020   Procedure: TRANSESOPHAGEAL ECHOCARDIOGRAM (TEE);  Surgeon: [name], MD;  Location: [facility];  Service: Cardiovascular;  Laterality: N/A;   TEE WITHOUT CARDIOVERSION N/A 08/06/2020   Procedure: TRANSESOPHAGEAL ECHOCARDIOGRAM (TEE);  Surgeon: [place] Olds, MD;  Location: [place];  Service: Open Heart Surgery;  Laterality: N/A;  OB History   No obstetric history on file.     Family History  Problem Relation Age of Onset   Heart disease Mother        a. ?valve   Heart failure Brother    Hypertension Brother     Social History   Tobacco Use   Smoking status: Former   Smokeless tobacco: Never   Tobacco comments:    Quit 30 years ago  Vaping Use   Vaping Use: Never used  Substance Use Topics    Alcohol use: Yes    Comment: social   Drug use: No    Home Medications Prior to Admission medications   Medication Sig Start Date End Date Taking? Authorizing Provider  acetaminophen (TYLENOL) 500 MG tablet Take 500 mg by mouth every 6 (six) hours as needed for moderate pain or headache.    [provider]  alum & mag hydroxide-simeth (MAALOX ADVANCED MAX ST) 761-607-37 MG/5ML suspension Take 15 mLs by mouth every 6 (six) hours as needed for indigestion. 12/09/20   Mesner, Corene Cornea, MD  aspirin EC 81 MG tablet Take 81 mg by mouth daily.     [provider]  atorvastatin (LIPITOR) 80 MG tablet Take 80 mg by mouth at bedtime. 07/14/20   [provider]  carvedilol (COREG) 12.5 MG tablet TAKE 1 TABLET BY MOUTH TWICE A DAY 04/24/21   End, Harrell Gave, MD  escitalopram (LEXAPRO) 10 MG tablet Take 10 mg by mouth daily. 03/30/21   [provider]  JARDIANCE 10 MG TABS tablet Take 10 mg by mouth daily. 04/05/21   [provider]  sacubitril-valsartan (ENTRESTO) 24-26 MG Take 1 tablet by mouth 2 (two) times daily. 06/08/21   Furth, Cadence H, PA-C  spironolactone (ALDACTONE) 25 MG tablet TAKE 1/2 TABLET BY MOUTH EVERY DAY 04/24/21   End, Harrell Gave, MD  tetrahydrozoline 0.05 % ophthalmic solution Place 1-2 drops into both eyes daily as needed (irritated eyes.). visine    [provider]    Allergies    Peanut butter flavor and Lisinopril  Review of Systems   Review of Systems  Constitutional:  Negative for fever.  HENT:  Negative for congestion and sore throat.   Eyes: Negative.   Respiratory:  Negative for chest tightness and shortness of breath.   Cardiovascular:  Negative for chest pain.  Gastrointestinal:  Negative for abdominal pain and nausea.  Genitourinary: Negative.   Musculoskeletal:  Negative for arthralgias, joint swelling and neck pain.  Skin: Negative.  Negative for rash and wound.  Neurological:  Positive for light-headedness.  Negative for dizziness, weakness, numbness and headaches.  Psychiatric/Behavioral: Negative.     Physical Exam Updated Vital Signs BP 123/77   Pulse 74   Temp (!) 97.2 F (36.2 C) (Temporal)   Resp 18   Ht 5\' 6"  (1.676 m)   Wt 88.9 kg   LMP 11/21/2020   SpO2 96%   BMI 31.64 kg/m   Physical Exam Vitals and nursing note reviewed.  Constitutional:      Appearance: She is well-developed.  HENT:     Head: Normocephalic and atraumatic.  Eyes:     Conjunctiva/sclera: Conjunctivae normal.  Cardiovascular:     Rate and Rhythm: Normal rate and regular rhythm.     Heart sounds: Normal heart sounds.  Pulmonary:     Effort: Pulmonary effort is normal.     Breath sounds: Normal breath sounds. No wheezing.  Abdominal:     General: Bowel sounds are normal.  Palpations: Abdomen is soft.     Tenderness: There is no abdominal tenderness.  Musculoskeletal:        General: Normal range of motion.     Cervical back: Normal range of motion.  Skin:    General: Skin is warm and dry.  Neurological:     Mental Status: She is alert.    ED Results / Procedures / Treatments   Labs (all labs ordered are listed, but only abnormal results are displayed) Labs Reviewed  CBC WITH DIFFERENTIAL/PLATELET - Abnormal; Notable for the following components:      Result Value   RBC 5.51 (*)    HCT 47.0 (*)    All other components within normal limits  BASIC METABOLIC PANEL - Abnormal; Notable for the following components:   CO2 21 (*)    Glucose, Bld 130 (*)    All other components within normal limits  TROPONIN I (HIGH SENSITIVITY)  TROPONIN I (HIGH SENSITIVITY)    EKG EKG Interpretation  Date/Time:  Wednesday July 18 2021 16:54:36 EST Ventricular Rate:  79 PR Interval:  174 QRS Duration: 80 QT Interval:  360 QTC Calculation: 412 R Axis:   1 Text Interpretation: Normal sinus rhythm Low voltage QRS Nonspecific T wave abnormality No significant change since last tracing Confirmed by  Lajean Saver [phone]) on 07/18/2021 5:01:00 PM  Radiology DG Chest Port 1 View  Result Date: 07/18/2021 CLINICAL DATA:  Near syncope EXAM: PORTABLE CHEST 1 VIEW COMPARISON:  12/05/2020 FINDINGS: Left-sided pacing device. Post sternotomy changes and atrial appendage clip. Cardiomegaly. No focal opacity, pleural effusion or pneumothorax. IMPRESSION: No active disease.  Cardiomegaly. Electronically Signed   By: Donavan Foil M.D.   On: 07/18/2021 22:08    Procedures Procedures   Medications Ordered in ED Medications - No data to display  ED Course  I have reviewed the triage vital signs and the nursing notes.  Pertinent labs & imaging results that were available during my care of the patient were reviewed by me and considered in my medical decision making (see chart for details).    MDM Rules/Calculators/A&P                           Pt with episodic episodes of lightheadness/near syncope of unclear etiology, labs, ekg, exam reassuring today.  VS remain stable here, clinically she is not dehydrated.  No evidence of arrhythmia as source of todays event, pt has been NSR monitored during todays visit.  No sob, cp, pleuritic sx, doubt PE.   Final Clinical Impression(s) / ED Diagnoses Final diagnoses:  Near syncope    Rx / DC Orders ED Discharge Orders     None        Landis Martins 07/20/21 1208    Lajean Saver, MD 07/23/21 1600

## 2021-07-18 NOTE — ED Triage Notes (Signed)
Pt had some feeling like she was going to pass out while doing some school work.  Pt admits to having a low BP yesterday. Pt denies any CP or SOB.  Pt with hx CHF.

## 2021-07-19 NOTE — Discharge Instructions (Addendum)
Your lab tests and exam today are reassuring.  Please call your cardiologist for close office followup.

## 2021-07-23 ENCOUNTER — Telehealth: Payer: Self-pay

## 2021-07-23 NOTE — Telephone Encounter (Signed)
Returned your call to schedule procedure.

## 2021-07-25 NOTE — Telephone Encounter (Signed)
Returned patients call. LVM to call office back. 

## 2021-07-25 NOTE — Telephone Encounter (Signed)
Returned patients call. No answer.

## 2021-07-25 NOTE — Telephone Encounter (Signed)
Pt returned phone call to sch appt

## 2021-07-26 ENCOUNTER — Telehealth: Payer: Self-pay | Admitting: Medical

## 2021-07-26 NOTE — Telephone Encounter (Signed)
Pt c/o BP issue: STAT if pt c/o blurred vision, one-sided weakness or slurred speech  1. What are your last 5 BP readings?    121/58 Today usually 130's/ 80's   2. Are you having any other symptoms (ex. Dizziness, headache, blurred vision, passed out)? Sleepy 2 days ago pre syncope and that comes and goes  3. What is your BP issue? Patient not sure if this bp is ok and what to do about this feeling   Patient has appt tomorrow but would like advise today states this is a scary feeling .

## 2021-07-26 NOTE — Telephone Encounter (Signed)
Called patient back and informed her that her BP reading of 121/58 is perfectly normal. Patient was very relieved as she thought this was low. Patient confirmed that she will be here for her appointment tomorrow.

## 2021-07-27 ENCOUNTER — Ambulatory Visit: Payer: Medicaid Other | Admitting: Medical

## 2021-07-27 NOTE — Progress Notes (Deleted)
Cardiology Office Note:    Date:  07/27/2021   ID:  Stacey Richardson, DOB 1966/08/05, MRN [id number]  PCP:  Theotis Burrow, MD  [facility] Cardiologist:  [name], MD  [facility] Electrophysiologist:  Vickie Epley, MD   Referring MD: Theotis Burrow*   Chief Complaint: 3 month follow-up  History of Present Illness:    Stacey Richardson is a 55 y.o. female with a hx of  stroke, HFrEF due to ICM, CAD s/p CABG (LIMA-LAD, RIMA-OM1), HTN, HLD, DM2, obesity, SVT s/p ablation with slow pathway modification and ICD implantation on March 29,2022.    Hospitalized 06/12/20 with acute speech difficulty and dysphagia. Diagnosed with left frontal lobe infarct. Echo showed reduced LVEF 35-40%. Started on coreg, losartan, aspirin, plavix, rosuvastatin.    At follow-up 06/19/20 noted DOE and Zio showed afib/flutter, brief episodes of Wenckebach. Coreg was discontinued.    Ed visit 07/11/20 with MRI with small subacute appearing white matter infarct in left corona radiata new compared to previous, in same vicinity as prior ischemia as well as tiny area of cortical encephalomalacia corresponding to left operculum infarct last month. Reviewed by neurology with notation of subacute stroke recommended for outpatient follow up and to continue aspirin.    Underwent TEE 07/18/20 for evaluation of possible PFO with no evidence of thrombus, EF 30-35%, negative bubble study, though notation of question small tunnel in interatrial septum. She was recommended for [facility] to rule out ischemia as etiology of heart failure which was performed 08/02/20 showing severe ostial LMCA stenosis and mild nonobstructive coronary artery disease of LAD and LCx. She was transferred to [facility] for CABG which was performed 08/06/20 with LIMA-LAD, RIMA-OM1 and left atrial appendage clipping. For further evaluation of possible PFO transcranial doppler with bubbles was recommended and positive. On review by Dr.  Saunders Revel and Dr. Burt Knack, no indication for PFO closure as no clear evidence that PFO exists. At follow up with Dr. Saunders Revel 10/11/20 low dose Losartan as well as Coreg were initiated for optimization of heart failure therapies. She was seen by Dr. Quentin Ore 10/18/20 and loop recorder place and discussion initiated regarding ICD for HFrEF as well as potential EP study due to history of SVT. She had an updated echo 11/08/20 LVEF 30-35%, LV global longitudinal strain -12.2%, mild MR.   Seen 01/10/21  by EP and was doing well, no shocks and no recurrent SVT.   Last seen 04/20/21 and reported occasional lightheadedness, possible dehydration vs menopause.   Today,   Repeat echo  Past Medical History:  Diagnosis Date   CAD (coronary artery disease)    CABG (07/2020; LIMA-LAD, RIMA-OM1)   CVA (cerebral vascular accident) ([place])    Diabetes mellitus    HFrEF (heart failure with reduced ejection fraction) ([place])    HLD (hyperlipidemia)    Hypertension    SVT (supraventricular tachycardia) ([place])    s/p ablation (11/2020)    Past Surgical History:  Procedure Laterality Date   CARDIAC CATHETERIZATION     CLIPPING OF ATRIAL APPENDAGE N/A 08/06/2020   Procedure: CLIPPING OF ATRIAL APPENDAGE USING ATRICURE CLIP SIZE 40MM;  Surgeon: Wonda Olds, MD;  Location: [place];  Service: Open Heart Surgery;  Laterality: N/A;   CORONARY ARTERY BYPASS GRAFT N/A 08/06/2020   Procedure: CORONARY ARTERY BYPASS GRAFTING (CABG) X2, USING BILATERAL INTERNAL MAMMARY ARTERIES;  Surgeon: Wonda Olds, MD;  Location: [place];  Service: Open Heart Surgery;  Laterality: N/A;   ICD IMPLANT N/A  12/05/2020   Procedure: ICD IMPLANT;  Surgeon: Vickie Epley, MD;  Location: [place] [facility];  Service: Cardiovascular;  Laterality: N/A;   KNEE SURGERY     RIGHT/LEFT HEART CATH AND CORONARY ANGIOGRAPHY Bilateral 08/02/2020   Procedure: RIGHT/LEFT HEART CATH AND CORONARY ANGIOGRAPHY;  Surgeon: [name], MD;  Location: [place] CV  LAB;  Service: Cardiovascular;  Laterality: Bilateral;   SVT ABLATION N/A 12/05/2020   Procedure: SVT ABLATION;  Surgeon: Vickie Epley, MD;  Location: [place] [facility];  Service: Cardiovascular;  Laterality: N/A;   TEE WITHOUT CARDIOVERSION N/A 07/18/2020   Procedure: TRANSESOPHAGEAL ECHOCARDIOGRAM (TEE);  Surgeon: [name], MD;  Location: [facility];  Service: Cardiovascular;  Laterality: N/A;   TEE WITHOUT CARDIOVERSION N/A 08/06/2020   Procedure: TRANSESOPHAGEAL ECHOCARDIOGRAM (TEE);  Surgeon: Wonda Olds, MD;  Location: [place];  Service: Open Heart Surgery;  Laterality: N/A;    Current Medications: No outpatient medications have been marked as taking for the 07/27/21 encounter (Appointment) with Kathlen Mody, Lawerance Matsuo H, PA-C.     Allergies:   Peanut butter flavor and Lisinopril   Social History   Socioeconomic History   Marital status: Divorced    Spouse name: Not on file   Number of children: Not on file   Years of education: Not on file   Highest education level: Not on file  Occupational History   Occupation: on short term disability  Tobacco Use   Smoking status: Former   Smokeless tobacco: Never   Tobacco comments:    Quit 30 years ago  Vaping Use   Vaping Use: Never used  Substance and Sexual Activity   Alcohol use: Yes    Comment: social   Drug use: No   Sexual activity: Not on file  Other Topics Concern   Not on file  Social History Narrative   Lives with boyfriend   Right Handed   Drinks no caffeine daily   Social Determinants of Health   Financial Resource Strain: High Risk   Difficulty of Paying Living Expenses: Very hard  Food Insecurity: Landscape architect Present   Worried About Charity fundraiser in the Last Year: Sometimes true   Arboriculturist in the Last Year: Sometimes true  Transportation Needs: Unmet Transportation Needs   Lack of Transportation (Medical): No   Lack of Transportation (Non-Medical): Yes  Physical Activity: Not on  file  Stress: Not on file  Social Connections: Not on file     Family History: The patient's family history includes Heart disease in her mother; Heart failure in her brother; Hypertension in her brother.  ROS:   Please see the history of present illness.     All other systems reviewed and are negative.  EKGs/Labs/Other Studies Reviewed:    The following studies were reviewed today:  Limited Echo 11/2020  1. Left ventricular ejection fraction, by estimation, is 30 to 35%. The  left ventricle has moderate to severely decreased function. The average  left ventricular global longitudinal strain is -12.1 %. The global  longitudinal strain is abnormal.   2. The mitral valve is normal in structure. Mild mitral valve  regurgitation.   3. The aortic valve was not well visualized. Aortic valve regurgitation  is not visualized.    Cardiac cath 2021 Conclusions: Severe ostial LMCA stenosis (~80%) with pressure dampening of 20F diagnostic catheter.  There is no significant improvement with intracoronary nitroglycerin. Mild, non-obstructive coronary artery disease involving LAD and dominant LCx. Normal  left and right heart filling pressures. Normal Fick cardiac output/index. Right radial artery loop.   Recommendations: Transfer to [facility] for cardiac surgery consultation.  If the patient is not a surgical candidate, her LMCA stenosis is treatable percutaneously, though this would require hemodynamic support given reduced LVEF and left-dominant system. Hold clopidogrel pending cardiac surgery evaluation. Avoid right radial artery access for future catheterization. Aggressive secondary prevention of coronary artery disease.   [name], MD [facility]   Echo 06/2020  1. Left ventricular ejection fraction, by estimation, is 35 to 40%. The  left ventricle has moderately decreased function. Left ventricular  endocardial border not optimally defined to evaluate regional wall  motion.  The left ventricular internal cavity  size was mildly dilated. There is mild left ventricular hypertrophy. Left  ventricular diastolic function could not be evaluated.   2. Right ventricular systolic function is normal. The right ventricular  size is normal.   3. The mitral valve is normal in structure. Mild to moderate mitral valve  regurgitation.   4. The aortic valve has an indeterminant number of cusps. Aortic valve  regurgitation not well assessed.   5. The inferior vena cava is normal in size with greater than 50%  respiratory variability, suggesting right atrial pressure of 3 mmHg.   EKG:  EKG is *** ordered today.  The ekg ordered today demonstrates ***  Recent Labs: 10/08/2020: ALT 15 12/01/2020: Magnesium 1.5 07/18/2021: BUN 11; Creatinine, Ser 0.62; Hemoglobin 14.8; Platelets 305; Potassium 4.1; Sodium 135  Recent Lipid Panel    Component Value Date/Time   CHOL 113 12/11/2020 1133   TRIG 60 12/11/2020 1133   HDL 41 12/11/2020 1133   CHOLHDL 2.8 12/11/2020 1133   CHOLHDL 3.3 08/03/2020 0042   VLDL 12 08/03/2020 0042   LDLCALC 59 12/11/2020 1133     Risk Assessment/Calculations:   {Does this patient have ATRIAL FIBRILLATION?:[id number]}   Physical Exam:    VS:  LMP 11/21/2020     Wt Readings from Last 3 Encounters:  07/18/21 196 lb (88.9 kg)  04/20/21 192 lb 4 oz (87.2 kg)  03/26/21 197 lb (89.4 kg)     GEN: *** Well nourished, well developed in no acute distress HEENT: Normal NECK: No JVD; No carotid bruits LYMPHATICS: No lymphadenopathy CARDIAC: ***RRR, no murmurs, rubs, gallops RESPIRATORY:  Clear to auscultation without rales, wheezing or rhonchi  ABDOMEN: Soft, non-tender, non-distended MUSCULOSKELETAL:  No edema; No deformity  SKIN: Warm and dry NEUROLOGIC:  Alert and oriented x 3 PSYCHIATRIC:  Normal affect   ASSESSMENT:    No diagnosis found. PLAN:    In order of problems listed above:  HFrEF ICM s/p ICD  CAD s/p  CABG  Cryptogenic stroke  HTN  SVT s/p SVT ablation  Dizziness  Disposition: Follow up {follow up:15908} with ***   Shared Decision Making/Informed Consent   {Are you ordering a CV Procedure (e.g. stress test, cath, DCCV, TEE, etc)?   Press F2        :[id number]}    Signed, Johnnie Moten Ninfa Meeker, PA-C  07/27/2021 8:01 AM    [place]

## 2021-07-31 ENCOUNTER — Other Ambulatory Visit: Payer: Self-pay

## 2021-07-31 DIAGNOSIS — Z1211 Encounter for screening for malignant neoplasm of colon: Secondary | ICD-10-CM

## 2021-07-31 MED ORDER — CLENPIQ 10-3.5-12 MG-GM -GM/160ML PO SOLN: 1.0000 | Freq: Once | ORAL | 0 refills | Status: AC

## 2021-07-31 NOTE — Progress Notes (Signed)
Gastroenterology Pre-Procedure Review  Request Date: 09/14/2021 Requesting Physician: Dr. Marius Ditch  PATIENT REVIEW QUESTIONS: The patient responded to the following health history questions as indicated:    1. Are you having any GI issues? no 2. Do you have a personal history of Polyps? no 3. Do you have a family history of Colon Cancer or Polyps? no 4. Diabetes Mellitus? yes (Type II) 5. Joint replacements in the past 12 months?no 6. Major health problems in the past 3 months?no 7. Any artificial heart valves, MVP, or defibrillator?no    MEDICATIONS & ALLERGIES:    Patient reports the following regarding taking any anticoagulation/antiplatelet therapy:   Plavix, Coumadin, Eliquis, Xarelto, Lovenox, Pradaxa, Brilinta, or Effient? no Aspirin? yes (81 mg)  Patient confirms/reports the following medications:  Current Outpatient Medications  Medication Sig Dispense Refill   acetaminophen (TYLENOL) 500 MG tablet Take 500 mg by mouth every 6 (six) hours as needed for moderate pain or headache.     alum & mag hydroxide-simeth (MAALOX ADVANCED MAX ST) 400-400-40 MG/5ML suspension Take 15 mLs by mouth every 6 (six) hours as needed for indigestion. 355 mL 0   aspirin EC 81 MG tablet Take 81 mg by mouth daily.      atorvastatin (LIPITOR) 80 MG tablet Take 80 mg by mouth at bedtime.     carvedilol (COREG) 12.5 MG tablet TAKE 1 TABLET BY MOUTH TWICE A DAY 60 tablet 3   escitalopram (LEXAPRO) 10 MG tablet Take 10 mg by mouth daily.     JARDIANCE 10 MG TABS tablet Take 10 mg by mouth daily.     sacubitril-valsartan (ENTRESTO) 24-26 MG Take 1 tablet by mouth 2 (two) times daily. 180 tablet 0   spironolactone (ALDACTONE) 25 MG tablet TAKE 1/2 TABLET BY MOUTH EVERY DAY 15 tablet 3   tetrahydrozoline 0.05 % ophthalmic solution Place 1-2 drops into both eyes daily as needed (irritated eyes.). visine     No current facility-administered medications for this visit.    Patient confirms/reports the  following allergies:  Allergies  Allergen Reactions   Peanut Butter Flavor Anaphylaxis and Swelling   Lisinopril Cough    No orders of the defined types were placed in this encounter.   AUTHORIZATION INFORMATION Primary Insurance: 1D#: Group #:  Secondary Insurance: 1D#: Group #:  SCHEDULE INFORMATION: Date: 09/14/2021  Time: Location: [place]

## 2021-08-13 ENCOUNTER — Telehealth (HOSPITAL_COMMUNITY): Payer: Self-pay | Admitting: Licensed Clinical Social Worker

## 2021-08-13 ENCOUNTER — Telehealth: Payer: Self-pay | Admitting: Medical

## 2021-08-13 DIAGNOSIS — I10 Essential (primary) hypertension: Secondary | ICD-10-CM

## 2021-08-13 DIAGNOSIS — I5022 Chronic systolic (congestive) heart failure: Secondary | ICD-10-CM

## 2021-08-13 NOTE — Telephone Encounter (Signed)
CSW reached out to remind of [facility] this week.  Pt hopeful to attend but unsure at this time.  Appreciates the reminder.    Jorge Ny, LCSW Clinical Social Worker [facility] Desk#: [phone] Cell#: [phone]

## 2021-08-13 NOTE — Telephone Encounter (Signed)
*  STAT* If patient is at the pharmacy, call can be transferred to refill team.   1. Which medications need to be refilled? (please list name of each medication and dose if known)  carvedilol and spironolactone  2. Which pharmacy/location (including street and city if local pharmacy) is medication to be sent to? [facility] [place]   3. Do they need a 30 day or 90 day supply? [place]

## 2021-08-14 MED ORDER — CARVEDILOL 12.5 MG PO TABS: 12.5000 mg | ORAL_TABLET | Freq: Two times a day (BID) | ORAL | 0 refills | Status: DC

## 2021-08-14 MED ORDER — SPIRONOLACTONE 25 MG PO TABS: 12.5000 mg | ORAL_TABLET | Freq: Every day | ORAL | 0 refills | Status: DC

## 2021-08-14 NOTE — Telephone Encounter (Signed)
Requested Prescriptions   Signed Prescriptions Disp Refills   carvedilol (COREG) 12.5 MG tablet 180 tablet 0    Sig: Take 1 tablet (12.5 mg total) by mouth 2 (two) times daily.    Authorizing Provider: END, CHRISTOPHER    Ordering User: Othelia Pulling C   spironolactone (ALDACTONE) 25 MG tablet 45 tablet 0    Sig: Take 0.5 tablets (12.5 mg total) by mouth daily.    Authorizing Provider: END, CHRISTOPHER    Ordering User: Britt Bottom

## 2021-08-20 ENCOUNTER — Ambulatory Visit: Payer: Medicaid Other | Admitting: Medical

## 2021-08-20 ENCOUNTER — Other Ambulatory Visit: Payer: Self-pay

## 2021-08-20 ENCOUNTER — Encounter: Payer: Self-pay | Admitting: Medical

## 2021-08-20 VITALS — BP 114/70 | HR 60 | Ht 66.0 in | Wt 196.0 lb

## 2021-08-20 DIAGNOSIS — I255 Ischemic cardiomyopathy: Secondary | ICD-10-CM

## 2021-08-20 DIAGNOSIS — I639 Cerebral infarction, unspecified: Secondary | ICD-10-CM

## 2021-08-20 DIAGNOSIS — E782 Mixed hyperlipidemia: Secondary | ICD-10-CM

## 2021-08-20 DIAGNOSIS — R55 Syncope and collapse: Secondary | ICD-10-CM

## 2021-08-20 DIAGNOSIS — I5022 Chronic systolic (congestive) heart failure: Secondary | ICD-10-CM

## 2021-08-20 DIAGNOSIS — Z951 Presence of aortocoronary bypass graft: Secondary | ICD-10-CM

## 2021-08-20 DIAGNOSIS — I10 Essential (primary) hypertension: Secondary | ICD-10-CM

## 2021-08-20 DIAGNOSIS — Z95810 Presence of automatic (implantable) cardiac defibrillator: Secondary | ICD-10-CM | POA: Diagnosis not present

## 2021-08-20 NOTE — Progress Notes (Signed)
Cardiology Office Note:    Date:  08/21/2021   ID:  TYIANNA Richardson, DOB 1965/12/23, MRN [id number]  PCP:  Theotis Burrow, MD  [facility] Cardiologist:  [name], MD  [facility] Electrophysiologist:  Vickie Epley, MD   Referring MD: Theotis Burrow*   Chief Complaint: 3 month follow-up  History of Present Illness:    Stacey Richardson is a 55 y.o. female with a hx of  stroke, HFrEF due to ICM, CAD s/p CABG (LIMA-LAD, RIMA-OM1), HTN, HLD, DM2, obesity, SVT s/p ablation with slow pathway modification and ICD implantation on March 29,2022.    Hospitalized 06/12/20 with acute speech difficulty and dysphagia. Diagnosed with left frontal lobe infarct. Echo showed reduced LVEF 35-40%. Started on coreg, losartan, aspirin, plavix, rosuvastatin.    At follow-up 06/19/20 noted DOE and Zio showed afib/flutter, brief episodes of Wenckebach. Coreg was discontinued.    Ed visit 07/11/20 with MRI with small subacute appearing white matter infarct in left corona radiata new compared to previous, in same vicinity as prior ischemia as well as tiny area of cortical encephalomalacia corresponding to left operculum infarct last month. Reviewed by neurology with notation of subacute stroke recommended for outpatient follow up and to continue aspirin.    Underwent TEE 07/18/20 for evaluation of possible PFO with no evidence of thrombus, EF 30-35%, negative bubble study, though notation of question small tunnel in interatrial septum. She was recommended for [facility] to rule out ischemia as etiology of heart failure which was performed 08/02/20 showing severe ostial LMCA stenosis and mild nonobstructive coronary artery disease of LAD and LCx. She was transferred to [facility] for CABG which was performed 08/06/20 with LIMA-LAD, RIMA-OM1 and left atrial appendage clipping. For further evaluation of possible PFO transcranial doppler with bubbles was recommended and positive. On review by Dr.  Saunders Revel and Dr. Burt Knack, no indication for PFO closure as no clear evidence that PFO exists. At follow up with Dr. Saunders Revel 10/11/20 low dose Losartan as well as Coreg were initiated for optimization of heart failure therapies. She was seen by Dr. Quentin Ore 10/18/20 and loop recorder place and discussion initiated regarding ICD for HFrEF as well as potential EP study due to history of SVT. She had an updated echo 11/08/20 LVEF 30-35%, LV global longitudinal strain -12.2%, mild MR.   Last seen 01/10/21  by EP and was doing well, no shocks and no recurrent SVT.   Seen 04/20/21 and reported occasional lightheadedness, orthostatics negative. Losartan was switched to [facility].   Seen in the ED 07/18/21 for near syncope.   Today, the patient reports she went to the ER for almost passing out. No dizziness. Feels like she is going to faint, but doesn't. No lightheadedness. Feels lethargic. No chest pain or SOB. She thinks near syncope from low BP. Says it was really low. 96/63 2 weeks ago in PCP office. Checks it at home, it's generally 120-130s/60s. She checks her BP after medications. She has not had recurrent near syncope. She did not send a transmission from ICD. Re-check device.   Past Medical History:  Diagnosis Date   CAD (coronary artery disease)    CABG (07/2020; LIMA-LAD, RIMA-OM1)   CVA (cerebral vascular accident) ([place])    Diabetes mellitus    HFrEF (heart failure with reduced ejection fraction) ([place])    HLD (hyperlipidemia)    Hypertension    SVT (supraventricular tachycardia) ([place])    s/p ablation (11/2020)    Past Surgical History:  Procedure  Laterality Date   CARDIAC CATHETERIZATION     CLIPPING OF ATRIAL APPENDAGE N/A 08/06/2020   Procedure: CLIPPING OF ATRIAL APPENDAGE USING ATRICURE CLIP SIZE 40MM;  Surgeon: Wonda Olds, MD;  Location: [place];  Service: Open Heart Surgery;  Laterality: N/A;   CORONARY ARTERY BYPASS GRAFT N/A 08/06/2020   Procedure: CORONARY ARTERY BYPASS GRAFTING (CABG) X2,  USING BILATERAL INTERNAL MAMMARY ARTERIES;  Surgeon: Wonda Olds, MD;  Location: [place];  Service: Open Heart Surgery;  Laterality: N/A;   ICD IMPLANT N/A 12/05/2020   Procedure: ICD IMPLANT;  Surgeon: Vickie Epley, MD;  Location: [place] CV [facility];  Service: Cardiovascular;  Laterality: N/A;   KNEE SURGERY     RIGHT/LEFT HEART CATH AND CORONARY ANGIOGRAPHY Bilateral 08/02/2020   Procedure: RIGHT/LEFT HEART CATH AND CORONARY ANGIOGRAPHY;  Surgeon: [name], MD;  Location: [place] [facility];  Service: Cardiovascular;  Laterality: Bilateral;   SVT ABLATION N/A 12/05/2020   Procedure: SVT ABLATION;  Surgeon: Vickie Epley, MD;  Location: [place] [facility];  Service: Cardiovascular;  Laterality: N/A;   TEE WITHOUT CARDIOVERSION N/A 07/18/2020   Procedure: TRANSESOPHAGEAL ECHOCARDIOGRAM (TEE);  Surgeon: [name], MD;  Location: [facility];  Service: Cardiovascular;  Laterality: N/A;   TEE WITHOUT CARDIOVERSION N/A 08/06/2020   Procedure: TRANSESOPHAGEAL ECHOCARDIOGRAM (TEE);  Surgeon: Wonda Olds, MD;  Location: [place];  Service: Open Heart Surgery;  Laterality: N/A;    Current Medications: Current Meds  Medication Sig   acetaminophen (TYLENOL) 500 MG tablet Take 500 mg by mouth every 6 (six) hours as needed for moderate pain or headache.   alum & mag hydroxide-simeth (MAALOX ADVANCED MAX ST) 400-400-40 MG/5ML suspension Take 15 mLs by mouth every 6 (six) hours as needed for indigestion.   aspirin EC 81 MG tablet Take 81 mg by mouth daily.    atorvastatin (LIPITOR) 80 MG tablet Take 80 mg by mouth at bedtime.   carvedilol (COREG) 12.5 MG tablet Take 1 tablet (12.5 mg total) by mouth 2 (two) times daily.   JARDIANCE 10 MG TABS tablet Take 10 mg by mouth daily.   sacubitril-valsartan (ENTRESTO) 24-26 MG Take 1 tablet by mouth 2 (two) times daily.   spironolactone (ALDACTONE) 25 MG tablet Take 0.5 tablets (12.5 mg total) by mouth daily.   tetrahydrozoline 0.05 %  ophthalmic solution Place 1-2 drops into both eyes daily as needed (irritated eyes.). visine     Allergies:   Peanut butter flavor and Lisinopril   Social History   Socioeconomic History   Marital status: Divorced    Spouse name: Not on file   Number of children: Not on file   Years of education: Not on file   Highest education level: Not on file  Occupational History   Occupation: on short term disability  Tobacco Use   Smoking status: Former   Smokeless tobacco: Never   Tobacco comments:    Quit 30 years ago  Vaping Use   Vaping Use: Never used  Substance and Sexual Activity   Alcohol use: Yes    Comment: social   Drug use: No   Sexual activity: Not on file  Other Topics Concern   Not on file  Social History Narrative   Lives with boyfriend   Right Handed   Drinks no caffeine daily   Social Determinants of Health   Financial Resource Strain: High Risk   Difficulty of Paying Living Expenses: Very hard  Food Insecurity: Food Insecurity Present  Worried About Charity fundraiser in the Last Year: Sometimes true   Arboriculturist in the Last Year: Sometimes true  Transportation Needs: Public librarian (Medical): No   Lack of Transportation (Non-Medical): Yes  Physical Activity: Not on file  Stress: Not on file  Social Connections: Not on file     Family History: The patient's family history includes Heart disease in her mother; Heart failure in her brother; Hypertension in her brother.  ROS:   Please see the history of present illness.     All other systems reviewed and are negative.  EKGs/Labs/Other Studies Reviewed:    The following studies were reviewed today:  Limited Echo 11/2020  1. Left ventricular ejection fraction, by estimation, is 30 to 35%. The  left ventricle has moderate to severely decreased function. The average  left ventricular global longitudinal strain is -12.1 %. The global  longitudinal strain is  abnormal.   2. The mitral valve is normal in structure. Mild mitral valve  regurgitation.   3. The aortic valve was not well visualized. Aortic valve regurgitation  is not visualized.    Cardiac cath 2021 Conclusions: Severe ostial LMCA stenosis (~80%) with pressure dampening of 34F diagnostic catheter.  There is no significant improvement with intracoronary nitroglycerin. Mild, non-obstructive coronary artery disease involving LAD and dominant LCx. Normal left and right heart filling pressures. Normal Fick cardiac output/index. Right radial artery loop.   Recommendations: Transfer to [facility] for cardiac surgery consultation.  If the patient is not a surgical candidate, her LMCA stenosis is treatable percutaneously, though this would require hemodynamic support given reduced LVEF and left-dominant system. Hold clopidogrel pending cardiac surgery evaluation. Avoid right radial artery access for future catheterization. Aggressive secondary prevention of coronary artery disease.   [name], MD [facility]   Echo 06/2020  1. Left ventricular ejection fraction, by estimation, is 35 to 40%. The  left ventricle has moderately decreased function. Left ventricular  endocardial border not optimally defined to evaluate regional wall motion.  The left ventricular internal cavity  size was mildly dilated. There is mild left ventricular hypertrophy. Left  ventricular diastolic function could not be evaluated.   2. Right ventricular systolic function is normal. The right ventricular  size is normal.   3. The mitral valve is normal in structure. Mild to moderate mitral valve  regurgitation.   4. The aortic valve has an indeterminant number of cusps. Aortic valve  regurgitation not well assessed.   5. The inferior vena cava is normal in size with greater than 50%  respiratory variability, suggesting right atrial pressure of 3 mmHg.     EKG:  EKG is  ordered today.  The ekg ordered  today demonstrates NSR, 60bpm, nonspecific T wave changes, LAD  Recent Labs: 10/08/2020: ALT 15 12/01/2020: Magnesium 1.5 07/18/2021: BUN 11; Creatinine, Ser 0.62; Hemoglobin 14.8; Platelets 305; Potassium 4.1; Sodium 135  Recent Lipid Panel    Component Value Date/Time   CHOL 113 12/11/2020 1133   TRIG 60 12/11/2020 1133   HDL 41 12/11/2020 1133   CHOLHDL 2.8 12/11/2020 1133   CHOLHDL 3.3 08/03/2020 0042   VLDL 12 08/03/2020 0042   LDLCALC 59 12/11/2020 1133     Physical Exam:    VS:  BP 114/70 (BP Location: Left Arm, Patient Position: Sitting, Cuff Size: Large)   Pulse 60   Ht 5\' 6"  (1.676 m)   Wt 196 lb (88.9 kg)  LMP 11/21/2020   SpO2 98%   BMI 31.64 kg/m     Wt Readings from Last 3 Encounters:  08/20/21 196 lb (88.9 kg)  07/18/21 196 lb (88.9 kg)  04/20/21 192 lb 4 oz (87.2 kg)     GEN:  Well nourished, well developed in no acute distress HEENT: Normal NECK: No JVD; No carotid bruits LYMPHATICS: No lymphadenopathy CARDIAC: RRR, no murmurs, rubs, gallops RESPIRATORY:  Clear to auscultation without rales, wheezing or rhonchi  ABDOMEN: Soft, non-tender, non-distended MUSCULOSKELETAL:  No edema; No deformity  SKIN: Warm and dry NEUROLOGIC:  Alert and oriented x 3 PSYCHIATRIC:  Normal affect   ASSESSMENT:    1. Near syncope   2. Chronic HFrEF (heart failure with reduced ejection fraction) ([place])   3. ICD (implantable cardioverter-defibrillator) in place   4. Cryptogenic stroke ([place])   5. Essential hypertension   6. Hyperlipidemia, mixed   7. S/P CABG x 2   8. Ischemic cardiomyopathy    PLAN:    In order of problems listed above:  Near syncope Unclear reason for near syncope. ER work up was unremarkable. Patient thought is was from low BP. BP today good. Orthostatics negative. EKG NSR 60bpm. Did not send in a device transmission. We will give her device clinic number. No further episodes. Recommend chcking BP daily. Says she stays hydrated.   HFrEF ICM  s/p ICD Patient is euvolemic on exam. Echo 11/2020 showed LVEF 30-35%. Re-check echo given recent syncope. Continue Coreg, Farxiga, spironolactone, and Entresto.   CAD s/p CABG No anginal symptoms. Continue Aspirin, BB, statin. No further ischemic work-up at this time.   Cryptogenic stroke She follows with neurology. Continue Aspirin and statin.   HTN BP good. Continue current medications.   HLD LDL 60. Continue Lipitor.  SVT s/p ablation Follows with EP.   Disposition: Follow up in 3 month(s) with MD/APP    Signed, Alayiah Fontes Ninfa Meeker, PA-C  08/21/2021 9:48 AM    [facility]

## 2021-08-20 NOTE — Patient Instructions (Signed)
Medication Instructions:  Your physician recommends that you continue on your current medications as directed. Please refer to the Current Medication list given to you today.   Lab Work: None ordered If you have labs (blood work) drawn today and your tests are completely normal, you will receive your results only by: [place] (if you have MyChart) OR A paper copy in the mail If you have any lab test that is abnormal or we need to change your treatment, we will call you to review the results.   Testing/Procedures: Your physician has requested that you have an echocardiogram. Echocardiography is a painless test that uses sound waves to create images of your heart. It provides your doctor with information about the size and shape of your heart and how well your heart's chambers and valves are working. This procedure takes approximately one hour. There are no restrictions for this procedure.    Follow-Up: At [facility] you and your health needs are our priority.  As part of our continuing mission to provide you with exceptional heart care, we have created designated Provider Care Teams.  These Care Teams include your primary Cardiologist (physician) and Advanced Practice Providers (APPs -  Physician Assistants and Nurse Practitioners) who all work together to provide you with the care you need, when you need it.  We recommend signing up for the patient portal called "MyChart".  Sign up information is provided on this After Visit Summary.  MyChart is used to connect with patients for Virtual Visits (Telemedicine).  Patients are able to view lab/test results, encounter notes, upcoming appointments, etc.  Non-urgent messages can be sent to your provider as well.   To learn more about what you can do with MyChart, go to [email or web address].    Your next appointment:   3 month(s)  The format for your next appointment:   In Person  Provider:   You may see [name], MD or  one of the following Advanced Practice Providers on your designated Care Team:   Murray Hodgkins, NP Christell Faith, PA-C Cadence Kathlen Mody, [place]   Other Instructions Please follow up with our [facility] about sending remote transmission for your device. Their telephone [phone].

## 2021-08-27 ENCOUNTER — Telehealth: Payer: Self-pay

## 2021-08-27 NOTE — Telephone Encounter (Signed)
LMOVM for the patient. We do received and review her transmissions. Her monitor is automatic and sends nightly.

## 2021-08-30 LAB — CUP PACEART REMOTE DEVICE CHECK
Date Time Interrogation Session: 20221222081104
Implantable Lead Implant Date: 20220329
Implantable Lead Location: 753860
Implantable Lead Model: 436909
Implantable Lead Serial Number: 81437635
Implantable Pulse Generator Implant Date: 20220329
Pulse Gen Model: 429525
Pulse Gen Serial Number: 84824217

## 2021-09-05 ENCOUNTER — Ambulatory Visit (INDEPENDENT_AMBULATORY_CARE_PROVIDER_SITE_OTHER): Payer: Medicaid Other

## 2021-09-05 DIAGNOSIS — I5022 Chronic systolic (congestive) heart failure: Secondary | ICD-10-CM

## 2021-09-07 NOTE — Telephone Encounter (Signed)
Error

## 2021-09-10 ENCOUNTER — Emergency Department (HOSPITAL_COMMUNITY)
Admission: EM | Admit: 2021-09-10 | Discharge: 2021-09-10 | Disposition: A | Discharge status: Home / Self Care | Payer: Medicaid Other | Attending: Emergency Medicine | Admitting: Emergency Medicine

## 2021-09-10 ENCOUNTER — Emergency Department (HOSPITAL_COMMUNITY): Payer: Medicaid Other

## 2021-09-10 ENCOUNTER — Encounter (HOSPITAL_COMMUNITY): Payer: Self-pay | Admitting: Emergency Medicine

## 2021-09-10 ENCOUNTER — Other Ambulatory Visit: Payer: Self-pay

## 2021-09-10 DIAGNOSIS — J111 Influenza due to unidentified influenza virus with other respiratory manifestations: Secondary | ICD-10-CM

## 2021-09-10 DIAGNOSIS — E119 Type 2 diabetes mellitus without complications: Secondary | ICD-10-CM | POA: Insufficient documentation

## 2021-09-10 DIAGNOSIS — I10 Essential (primary) hypertension: Secondary | ICD-10-CM | POA: Diagnosis not present

## 2021-09-10 DIAGNOSIS — Z951 Presence of aortocoronary bypass graft: Secondary | ICD-10-CM | POA: Insufficient documentation

## 2021-09-10 DIAGNOSIS — R519 Headache, unspecified: Secondary | ICD-10-CM | POA: Diagnosis present

## 2021-09-10 DIAGNOSIS — Z79899 Other long term (current) drug therapy: Secondary | ICD-10-CM | POA: Insufficient documentation

## 2021-09-10 DIAGNOSIS — Z7982 Long term (current) use of aspirin: Secondary | ICD-10-CM | POA: Diagnosis not present

## 2021-09-10 DIAGNOSIS — Z7984 Long term (current) use of oral hypoglycemic drugs: Secondary | ICD-10-CM | POA: Diagnosis not present

## 2021-09-10 LAB — CBC WITH DIFFERENTIAL/PLATELET
Abs Immature Granulocytes: 0.02 10*3/uL (ref 0.00–0.07)
Basophils Absolute: 0.1 10*3/uL (ref 0.0–0.1)
Basophils Relative: 1 %
Eosinophils Absolute: 0.3 10*3/uL (ref 0.0–0.5)
Eosinophils Relative: 4 %
HCT: 44 % (ref 36.0–46.0)
Hemoglobin: 13.8 g/dL (ref 12.0–15.0)
Immature Granulocytes: 0 %
Lymphocytes Relative: 29 %
Lymphs Abs: 1.6 10*3/uL (ref 0.7–4.0)
MCH: 27.3 pg (ref 26.0–34.0)
MCHC: 31.4 g/dL (ref 30.0–36.0)
MCV: 87 fL (ref 80.0–100.0)
Monocytes Absolute: 0.3 10*3/uL (ref 0.1–1.0)
Monocytes Relative: 6 %
Neutro Abs: 3.4 10*3/uL (ref 1.7–7.7)
Neutrophils Relative %: 60 %
Platelets: 309 10*3/uL (ref 150–400)
RBC: 5.06 MIL/uL (ref 3.87–5.11)
RDW: 14.2 % (ref 11.5–15.5)
WBC: 5.7 10*3/uL (ref 4.0–10.5)
nRBC: 0 % (ref 0.0–0.2)

## 2021-09-10 LAB — BASIC METABOLIC PANEL
Anion gap: 9 (ref 5–15)
BUN: 10 mg/dL (ref 6–20)
CO2: 26 mmol/L (ref 22–32)
Calcium: 9.3 mg/dL (ref 8.9–10.3)
Chloride: 104 mmol/L (ref 98–111)
Creatinine, Ser: 0.81 mg/dL (ref 0.44–1.00)
GFR, Estimated: >60 mL/min (ref >60)
Glucose, Bld: 187 mg/dL — ABNORMAL HIGH (ref 70–99)
Potassium: 4.2 mmol/L (ref 3.5–5.1)
Sodium: 139 mmol/L (ref 135–145)

## 2021-09-10 NOTE — ED Notes (Signed)
Patient verbalizes understanding of discharge instructions. Opportunity for questioning and answers were provided. Armband removed by staff, pt discharged from ED ambulatory.   

## 2021-09-10 NOTE — ED Provider Notes (Signed)
[facility] EMERGENCY DEPARTMENT Provider Note   CSN: [id number] Arrival date & time: 09/10/21  1248     History  Chief Complaint  Patient presents with   Headache    Stacey Richardson is a 56 y.o. female with significant past medical history for TIA, HFrEF secondary to ischemic cardiomyopathy s/p CABG (LIMA-LAD, RIMA-OM1), HTN, HLD, DM2, obesity, SVT s/p ablation with slow pathway modification and ICD implantation March 2022 who presents with headache.  Patient states that for over the past week she has had cough, congestion, chills, myalgias, and headache.  She says her headache typically goes away with 500 mg Tylenol.  She was seen at [facility] ED on 09/06/2021 and diagnosed with influenza A.  She denies any chest pain, shortness of breath, nausea, vomiting, abdominal pain, urinary symptoms, changes to bowel movements, weakness, or numbness.  Home Medications Prior to Admission medications   Medication Sig Start Date End Date Taking? Authorizing Provider  acetaminophen (TYLENOL) 500 MG tablet Take 500 mg by mouth every 6 (six) hours as needed for moderate pain or headache.    [provider]  alum & mag hydroxide-simeth (MAALOX ADVANCED MAX ST) [phone] MG/5ML suspension Take 15 mLs by mouth every 6 (six) hours as needed for indigestion. 12/09/20   Mesner, Corene Cornea, MD  aspirin EC 81 MG tablet Take 81 mg by mouth daily.     [provider]  atorvastatin (LIPITOR) 80 MG tablet Take 80 mg by mouth at bedtime. 07/14/20   [provider]  carvedilol (COREG) 12.5 MG tablet Take 1 tablet (12.5 mg total) by mouth 2 (two) times daily. 08/14/21   End, Harrell Gave, MD  JARDIANCE 10 MG TABS tablet Take 10 mg by mouth daily. 04/05/21   [provider]  sacubitril-valsartan (ENTRESTO) 24-26 MG Take 1 tablet by mouth 2 (two) times daily. 06/08/21   Furth, Cadence H, PA-C  spironolactone (ALDACTONE) 25 MG tablet Take 0.5 tablets (12.5 mg total) by mouth daily.  08/14/21   End, Harrell Gave, MD  tetrahydrozoline 0.05 % ophthalmic solution Place 1-2 drops into both eyes daily as needed (irritated eyes.). visine    [provider]      Allergies    Peanut butter flavor and Lisinopril    Review of Systems   Review of Systems  Constitutional:  Positive for chills. Negative for fever.  HENT:  Positive for congestion and sore throat.   Eyes:  Negative for visual disturbance.  Respiratory:  Positive for cough. Negative for shortness of breath.   Cardiovascular:  Negative for chest pain and palpitations.  Gastrointestinal:  Negative for abdominal pain, nausea and vomiting.  Genitourinary:  Negative for dysuria and frequency.  Musculoskeletal:  Negative for arthralgias and back pain.  Skin:  Negative for color change and rash.  Neurological:  Positive for headaches. Negative for seizures, syncope, weakness and numbness.  All other systems reviewed and are negative.  Physical Exam Updated Vital Signs BP (!) 120/56    Pulse 74    Temp 97.9 F (36.6 C) (Oral)    Resp 20    Ht 5\' 6"  (1.676 m)    Wt 88.5 kg    LMP 11/21/2020    SpO2 100%    BMI 31.47 kg/m  Physical Exam Vitals and nursing note reviewed.  Constitutional:      General: She is not in acute distress.    Appearance: She is well-developed. She is obese. She is not ill-appearing.  HENT:  Head: Normocephalic and atraumatic.     Right Ear: External ear normal.     Left Ear: External ear normal.     Nose: Nose normal.     Mouth/Throat:     Pharynx: Oropharynx is clear.  Eyes:     Extraocular Movements: Extraocular movements intact.     Conjunctiva/sclera: Conjunctivae normal.     Pupils: Pupils are equal, round, and reactive to light.  Cardiovascular:     Rate and Rhythm: Normal rate and regular rhythm.     Pulses: Normal pulses.     Heart sounds: Normal heart sounds. No murmur heard. Pulmonary:     Effort: Pulmonary effort is normal. No respiratory distress.     Breath  sounds: Normal breath sounds.  Abdominal:     Palpations: Abdomen is soft.     Tenderness: There is no abdominal tenderness.  Musculoskeletal:        General: No swelling.     Cervical back: Neck supple.  Skin:    General: Skin is warm and dry.     Capillary Refill: Capillary refill takes less than 2 seconds.  Neurological:     General: No focal deficit present.     Mental Status: She is alert and oriented to person, place, and time.     Sensory: No sensory deficit.     Motor: No weakness.  Psychiatric:        Mood and Affect: Mood normal.    ED Results / Procedures / Treatments   Labs (all labs ordered are listed, but only abnormal results are displayed) Labs Reviewed  BASIC METABOLIC PANEL - Abnormal; Notable for the following components:      Result Value   Glucose, Bld 187 (*)    All other components within normal limits  CBC WITH DIFFERENTIAL/PLATELET    EKG None  Radiology CT Head Wo Contrast  Result Date: 09/10/2021 CLINICAL DATA:  Left-sided headache for 7-8 days, syncope EXAM: CT HEAD WITHOUT CONTRAST TECHNIQUE: Contiguous axial images were obtained from the base of the skull through the vertex without intravenous contrast. Techniques to minimize radiation exposure, such as automated exposure control, adjustment of mA and/or kV according to patient size, or iterative reconstruction, are utilized, when appropriate, to reduce radiation dose to as low as reasonably achievable. COMPARISON:  10/08/2020 FINDINGS: Brain: No acute infarct or hemorrhage. Lateral ventricles and midline structures are unremarkable. No acute extra-axial fluid collections. No mass effect. Vascular: No hyperdense vessel or unexpected calcification. Skull: Normal. Negative for fracture or focal lesion. Sinuses/Orbits: No acute finding. Other: None. IMPRESSION: 1. Stable head CT, no acute intracranial process. Electronically Signed   By: Randa Ngo M.D.   On: 09/10/2021 15:15     Procedures Procedures   Medications Ordered in ED Medications - No data to display  ED Course/ Medical Decision Making/ A&P                            Patient presents with viral symptoms in the setting of being flu positive as described in HPI above.  The patient was primarily concerned about her headache as she has had TIAs in the past, but patient has no focal neurologic deficits and her headache goes away with Tylenol.  I reviewed the patient's CBC and BMP which are both completely normal.  No significant leukocytosis.  No electrolyte abnormalities or AKI on metabolic panel.  CT head with no acute intracranial abnormalities.  I believe  the patient's symptoms are all attributable to flu diagnosis and I do not believe that additional tests, imaging, or admission is warranted at this time.  Patient is appropriate for discharge home.  Discharge instructions and return precautions were discussed with the patient prior to discharge and included in the AVS.  Patient voiced understanding of these instructions.  The patient was then discharged in stable condition.  Final Clinical Impression(s) / ED Diagnoses Final diagnoses:  Influenza    Rx / DC Orders ED Discharge Orders     None         Ceana Fiala, Amalia Hailey, MD 09/11/21 1239    Sherwood Gambler, MD 09/13/21 1921

## 2021-09-10 NOTE — ED Provider Notes (Signed)
Emergency Medicine Provider Triage Evaluation Note  Stacey Richardson , a 56 y.o. female  was evaluated in triage.  Pt complains of left-sided headache for the last 7 or 8 days.  She describes as a dull sensation.  No history of headaches.  No focal weakness or numbness.  She has been having "falling out" spells. She has been seen by her PCP for her falling out spells   Review of Systems  Positive:  Negative: See above   Physical Exam  BP 119/71 (BP Location: Right Arm)    Pulse 74    Temp 98.5 F (36.9 C) (Oral)    Resp 17    Ht 5\' 6"  (1.676 m)    Wt 88.5 kg    LMP 11/21/2020    SpO2 98%    BMI 31.47 kg/m  Gen:   Awake, no distress   Resp:  Normal effort  MSK:   Moves extremities without difficulty  Other:  Cranial nerves II through XII are intact.  5/5 strength in the upper lower extremities.  Normal sensation to the upper and lower extremities.  Normal finger-nose without any signs of dysmetria.  No dysdiadochokinesia on rapid altering movements.  Medical Decision Making  Medically screening exam initiated at 2:40 PM.  Appropriate orders placed.  Kalei L Wildasin was informed that the remainder of the evaluation will be completed by another provider, this initial triage assessment does not replace that evaluation, and the importance of remaining in the ED until their evaluation is complete.     Myna Bright [place], PA-C 09/10/21 1442    Godfrey Pick, MD 09/12/21 [phone]

## 2021-09-10 NOTE — ED Triage Notes (Signed)
Pt from home complaint of dull headache for approx. 8 days. VSS. NAD.

## 2021-09-11 ENCOUNTER — Telehealth: Payer: Self-pay | Admitting: Internal Medicine

## 2021-09-11 NOTE — Telephone Encounter (Signed)
Pt c/o medication issue:  1. Name of Medication: Entresto  2. How are you currently taking this medication (dosage and times per day)? 1 tablet twice a day  3. Are you having a reaction (difficulty breathing--STAT)? Bad headaches  4. What is your medication issue? Issues for 2wks

## 2021-09-11 NOTE — Telephone Encounter (Signed)
Attempted to call pt. No answer. Lmtcb.  

## 2021-09-12 NOTE — Telephone Encounter (Signed)
Spoke to pt.  Pt reports HA mainly on the left side for about 8 days.  Pt initially thought could be r/t taking Entresto.  Pt has been taking Entresto since October 2022 without issue.  Pt reports BP has been running 120s / 60s-80.  Using Tylenol for pain with some relief.  Pt did have CT yesterday d/t HA that was stable.  Upon phone assessment, pt proceeded to report she has also had a toothache on the left side.   Advised pt that HA is likely d/t tooth ache.  Advised pt see dentist.  Pt states she will call dentist after this phone call.   Pt will let us know of any recurrent s/s after work up at dentist.

## 2021-09-13 NOTE — Anesthesia Preprocedure Evaluation (Addendum)
Anesthesia Evaluation  Patient identified by MRN, date of birth, ID band Patient awake    Airway Mallampati: II  TM Distance: >3 FB Neck ROM: Full    Dental no notable dental hx.    Pulmonary former smoker,    Pulmonary exam normal breath sounds clear to auscultation       Cardiovascular hypertension, + CAD and + CABG (11/22)  Normal cardiovascular exam+ pacemaker + Cardiac Defibrillator  Rhythm:Regular Rate:Normal  HLD  EKG  NSR  LAE  ECHO 11/2020 IMPRESSIONS    1. Left ventricular ejection fraction, by estimation, is 30 to 35%. The  left ventricle has moderate to severely decreased function. The average  left ventricular global longitudinal strain is -12.1 %. The global  longitudinal strain is abnormal.  2. The mitral valve is normal in structure. Mild mitral valve  regurgitation.  3. The aortic valve was not well visualized. Aortic valve regurgitation  is not visualized.   PT HAS HAD 1 VESSEL CABG SINCE WITH NL EX TOLERANCE   Neuro/Psych TIA   GI/Hepatic   Endo/Other  diabetesMorbid obesity  Renal/GU      Musculoskeletal   Abdominal   Peds  Hematology   Anesthesia Other Findings   Reproductive/Obstetrics                           Anesthesia Physical Anesthesia Plan  ASA: 3  Anesthesia Plan: General   Post-op Pain Management:    Induction: Intravenous  PONV Risk Score and Plan:   Airway Management Planned: Natural Airway and Nasal Cannula  Additional Equipment:   Intra-op Plan:   Post-operative Plan:   Informed Consent: I have reviewed the patients History and Physical, chart, labs and discussed the procedure including the risks, benefits and alternatives for the proposed anesthesia with the patient or authorized representative who has indicated his/her understanding and acceptance.     Dental Advisory Given  Plan Discussed with: Anesthesiologist, CRNA and  Surgeon  Anesthesia Plan Comments: (Patient consented for risks of anesthesia including but not limited to:  - adverse reactions to medications - risk of airway placement if required - damage to eyes, teeth, lips or other oral mucosa - nerve damage due to positioning  - sore throat or hoarseness - Damage to heart, brain, nerves, lungs, other parts of body or loss of life  Patient voiced understanding.)        Anesthesia Quick Evaluation

## 2021-09-14 ENCOUNTER — Ambulatory Visit: Payer: Medicaid Other | Admitting: Anesthesiology

## 2021-09-14 ENCOUNTER — Encounter
Admission: RE | Disposition: A | Discharge status: Home / Self Care | Payer: Self-pay | Source: Ambulatory Visit | Attending: Gastroenterology

## 2021-09-14 ENCOUNTER — Ambulatory Visit
Admission: RE | Admit: 2021-09-14 | Discharge: 2021-09-14 | Disposition: A | Discharge status: Home / Self Care | Payer: Medicaid Other | Source: Ambulatory Visit | Attending: Gastroenterology | Admitting: Gastroenterology

## 2021-09-14 DIAGNOSIS — Z6831 Body mass index (BMI) 31.0-31.9, adult: Secondary | ICD-10-CM | POA: Diagnosis not present

## 2021-09-14 DIAGNOSIS — I110 Hypertensive heart disease with heart failure: Secondary | ICD-10-CM | POA: Insufficient documentation

## 2021-09-14 DIAGNOSIS — E119 Type 2 diabetes mellitus without complications: Secondary | ICD-10-CM | POA: Diagnosis not present

## 2021-09-14 DIAGNOSIS — I2510 Atherosclerotic heart disease of native coronary artery without angina pectoris: Secondary | ICD-10-CM | POA: Insufficient documentation

## 2021-09-14 DIAGNOSIS — K633 Ulcer of intestine: Secondary | ICD-10-CM

## 2021-09-14 DIAGNOSIS — I5022 Chronic systolic (congestive) heart failure: Secondary | ICD-10-CM | POA: Insufficient documentation

## 2021-09-14 DIAGNOSIS — Z1211 Encounter for screening for malignant neoplasm of colon: Secondary | ICD-10-CM

## 2021-09-14 DIAGNOSIS — Z95810 Presence of automatic (implantable) cardiac defibrillator: Secondary | ICD-10-CM | POA: Diagnosis not present

## 2021-09-14 DIAGNOSIS — Z951 Presence of aortocoronary bypass graft: Secondary | ICD-10-CM | POA: Diagnosis not present

## 2021-09-14 HISTORY — PX: COLONOSCOPY WITH PROPOFOL: SHX5780

## 2021-09-14 LAB — POCT PREGNANCY, URINE: Preg Test, Ur: NEGATIVE

## 2021-09-14 LAB — GLUCOSE, CAPILLARY: Glucose-Capillary: 120 mg/dL — ABNORMAL HIGH (ref 70–99)

## 2021-09-14 SURGERY — COLONOSCOPY WITH PROPOFOL
Anesthesia: General

## 2021-09-14 MED ORDER — DEXMEDETOMIDINE HCL 200 MCG/2ML IV SOLN
INTRAVENOUS | Status: DC | PRN
  Administered 2021-09-14: 20 ug via INTRAVENOUS

## 2021-09-14 MED ORDER — PROPOFOL 500 MG/50ML IV EMUL
INTRAVENOUS | Status: DC | PRN
  Administered 2021-09-14: 150 ug/kg/min via INTRAVENOUS

## 2021-09-14 MED ORDER — EPHEDRINE SULFATE 50 MG/ML IJ SOLN
INTRAMUSCULAR | Status: DC | PRN
  Administered 2021-09-14 (×2): 10 mg via INTRAVENOUS

## 2021-09-14 MED ORDER — PROPOFOL 10 MG/ML IV BOLUS
INTRAVENOUS | Status: DC | PRN
Start: 2021-09-14 — End: 2021-09-14
  Administered 2021-09-14: 80 mg via INTRAVENOUS

## 2021-09-14 MED ORDER — LIDOCAINE HCL (CARDIAC) PF 100 MG/5ML IV SOSY
PREFILLED_SYRINGE | INTRAVENOUS | Status: DC | PRN
  Administered 2021-09-14: 100 mg via INTRAVENOUS

## 2021-09-14 MED ORDER — SODIUM CHLORIDE 0.9 % IV SOLN: INTRAVENOUS | Status: DC

## 2021-09-14 NOTE — Transfer of Care (Signed)
Immediate Anesthesia Transfer of Care Note  Patient: Vilma Meckel  Procedure(s) Performed: COLONOSCOPY WITH PROPOFOL  Patient Location: Endoscopy Unit  Anesthesia Type:General  Level of Consciousness: drowsy  Airway & Oxygen Therapy: Patient Spontanous Breathing  Post-op Assessment: Report given to RN and Post -op Vital signs reviewed and stable  Post vital signs: Reviewed and stable  Last Vitals:  Vitals Value Taken Time  BP 121/66 09/14/21 0949  Temp 36.1 C 09/14/21 0949  Pulse 78 09/14/21 0949  Resp 13 09/14/21 0949  SpO2 100 % 09/14/21 0949  Vitals shown include unvalidated device data.  Last Pain:  Vitals:   09/14/21 0949  TempSrc: Temporal  PainSc: Asleep         Complications: No notable events documented.

## 2021-09-14 NOTE — Op Note (Signed)
[facility] Gastroenterology Patient Name: Stacey Richardson Procedure Date: 09/14/2021 9:27 AM MRN: [id number] Account #: [id number] Date of Birth: Aug 02, 1966 Admit Type: Outpatient Age: 56 Room: [facility] ENDO ROOM 2 Gender: Female Note Status: Finalized Instrument Name: Jasper Riling [id number] Procedure:             Colonoscopy Indications:           Screening for colorectal malignant neoplasm, This is                         the patient's first colonoscopy Providers:             Lin Landsman MD, MD Referring MD:          [name] [name] (Referring MD) Medicines:             General Anesthesia Complications:         No immediate complications. Estimated blood loss: None. Procedure:             Pre-Anesthesia Assessment:                        - Prior to the procedure, a History and Physical was                         performed, and patient medications and allergies were                         reviewed. The patient is competent. The risks and                         benefits of the procedure and the sedation options and                         risks were discussed with the patient. All questions                         were answered and informed consent was obtained.                         Patient identification and proposed procedure were                         verified by the physician, the nurse, the                         anesthesiologist, the anesthetist and the technician                         in the pre-procedure area in the procedure room in the                         endoscopy suite. Mental Status Examination: alert and                         oriented. Airway Examination: normal oropharyngeal                         airway and neck mobility. Respiratory Examination:  clear to auscultation. CV Examination: normal.                         Prophylactic Antibiotics: The patient does not require                          prophylactic antibiotics. Prior Anticoagulants: The                         patient has taken no previous anticoagulant or                         antiplatelet agents. ASA Grade Assessment: III - A                         patient with severe systemic disease. After reviewing                         the risks and benefits, the patient was deemed in                         satisfactory condition to undergo the procedure. The                         anesthesia plan was to use general anesthesia.                         Immediately prior to administration of medications,                         the patient was re-assessed for adequacy to receive                         sedatives. The heart rate, respiratory rate, oxygen                         saturations, blood pressure, adequacy of pulmonary                         ventilation, and response to care were monitored                         throughout the procedure. The physical status of the                         patient was re-assessed after the procedure.                        After obtaining informed consent, the colonoscope was                         passed under direct vision. Throughout the procedure,                         the patient's blood pressure, pulse, and oxygen                         saturations were monitored continuously. The  Colonoscope was introduced through the anus and                         advanced to the the cecum, identified by appendiceal                         orifice and ileocecal valve. The colonoscopy was                         performed without difficulty. The patient tolerated                         the procedure well. The quality of the bowel                         preparation was evaluated using the BBPS [facility]                         Preparation Scale) with scores of: Right Colon = 3,                         Transverse Colon = 3 and Left Colon = 3 (entire mucosa                          seen well with no residual staining, small fragments                         of stool or opaque liquid). The total BBPS score                         equals 9. Findings:      The perianal and digital rectal examinations were normal. Pertinent       negatives include normal sphincter tone and no palpable rectal lesions.      A localized area of Nonbleeding healed ulcerated mucosa with no stigmata       of recent bleeding were present in the cecum likely secondary to       ischemia given her history of ischemic cardiomyopathy. Biopsies were       taken with a cold forceps for histology.      The retroflexed view of the distal rectum and anal verge was normal and       showed no anal or rectal abnormalities.      The exam was otherwise without abnormality. Impression:            - Mucosal ulceration. Biopsied.                        - The distal rectum and anal verge are normal on                         retroflexion view.                        - The examination was otherwise normal. Recommendation:        - Discharge patient to home (with escort).                        -  Resume previous diet today.                        - Continue present medications.                        - Await pathology results.                        - Repeat colonoscopy in 10 years for screening                         purposes. Procedure Code(s):     --- Professional ---                        [phone], Colonoscopy, flexible; with biopsy, single or                         multiple Diagnosis Code(s):     --- Professional ---                        Z12.11, Encounter for screening for malignant neoplasm                         of colon                        K63.3, Ulcer of intestine CPT copyright 2019 [employer]. All rights reserved. The codes documented in this report are preliminary and upon coder review may  be revised to meet current compliance requirements. Dr. Ulyess Mort Lin Landsman MD, MD 09/14/2021 9:49:10 AM This report has been signed electronically. Number of Addenda: 0 Note Initiated On: 09/14/2021 9:27 AM Scope Withdrawal Time: 0 hours 8 minutes 45 seconds  Total Procedure Duration: 0 hours 12 minutes 24 seconds  Estimated Blood Loss:  Estimated blood loss: none.      [facility]

## 2021-09-14 NOTE — H&P (Signed)
Cephas Darby, MD [address]  [place]  [place], [place] [address]  Main: [phone]  Fax: [phone] Pager: [phone]  Primary Care Physician:  Theotis Burrow, MD Primary Gastroenterologist:  Dr. Cephas Darby  Pre-Procedure History & Physical: HPI:  Stacey Richardson is a 56 y.o. female is here for an colonoscopy.   Past Medical History:  Diagnosis Date   CAD (coronary artery disease)    CABG (07/2020; LIMA-LAD, RIMA-OM1)   CVA (cerebral vascular accident) ([place])    Diabetes mellitus    HFrEF (heart failure with reduced ejection fraction) ([place])    HLD (hyperlipidemia)    Hypertension    SVT (supraventricular tachycardia) ([place])    s/p ablation (11/2020)    Past Surgical History:  Procedure Laterality Date   CARDIAC CATHETERIZATION     CLIPPING OF ATRIAL APPENDAGE N/A 08/06/2020   Procedure: CLIPPING OF ATRIAL APPENDAGE USING ATRICURE CLIP SIZE 40MM;  Surgeon: Wonda Olds, MD;  Location: [facility];  Service: Open Heart Surgery;  Laterality: N/A;   CORONARY ARTERY BYPASS GRAFT N/A 08/06/2020   Procedure: CORONARY ARTERY BYPASS GRAFTING (CABG) X2, USING BILATERAL INTERNAL MAMMARY ARTERIES;  Surgeon: Wonda Olds, MD;  Location: [facility];  Service: Open Heart Surgery;  Laterality: N/A;   ICD IMPLANT N/A 12/05/2020   Procedure: ICD IMPLANT;  Surgeon: Vickie Epley, MD;  Location: [facility];  Service: Cardiovascular;  Laterality: N/A;   KNEE SURGERY     RIGHT/LEFT HEART CATH AND CORONARY ANGIOGRAPHY Bilateral 08/02/2020   Procedure: RIGHT/LEFT HEART CATH AND CORONARY ANGIOGRAPHY;  Surgeon: [name], MD;  Location: [facility];  Service: Cardiovascular;  Laterality: Bilateral;   SVT ABLATION N/A 12/05/2020   Procedure: SVT ABLATION;  Surgeon: Vickie Epley, MD;  Location: [facility];  Service: Cardiovascular;  Laterality: N/A;   TEE WITHOUT CARDIOVERSION N/A 07/18/2020   Procedure: TRANSESOPHAGEAL ECHOCARDIOGRAM  (TEE);  Surgeon: [name], MD;  Location: [facility];  Service: Cardiovascular;  Laterality: N/A;   TEE WITHOUT CARDIOVERSION N/A 08/06/2020   Procedure: TRANSESOPHAGEAL ECHOCARDIOGRAM (TEE);  Surgeon: Wonda Olds, MD;  Location: [facility];  Service: Open Heart Surgery;  Laterality: N/A;    Prior to Admission medications   Medication Sig Start Date End Date Taking? Authorizing Provider  acetaminophen (TYLENOL) 500 MG tablet Take 500 mg by mouth every 6 (six) hours as needed for moderate pain or headache.    [provider]  alum & mag hydroxide-simeth (MAALOX ADVANCED MAX ST) 235-361-44 MG/5ML suspension Take 15 mLs by mouth every 6 (six) hours as needed for indigestion. 12/09/20   Mesner, Corene Cornea, MD  aspirin EC 81 MG tablet Take 81 mg by mouth daily.     [provider]  atorvastatin (LIPITOR) 80 MG tablet Take 80 mg by mouth at bedtime. 07/14/20   [provider]  carvedilol (COREG) 12.5 MG tablet Take 1 tablet (12.5 mg total) by mouth 2 (two) times daily. 08/14/21   End, Harrell Gave, MD  JARDIANCE 10 MG TABS tablet Take 10 mg by mouth daily. 04/05/21   [provider]  sacubitril-valsartan (ENTRESTO) 24-26 MG Take 1 tablet by mouth 2 (two) times daily. 06/08/21   Furth, Cadence H, PA-C  spironolactone (ALDACTONE) 25 MG tablet Take 0.5 tablets (12.5 mg total) by mouth daily. 08/14/21   End, Harrell Gave, MD  tetrahydrozoline 0.05 % ophthalmic solution Place 1-2 drops into both eyes daily as needed (irritated eyes.). visine    [provider]    Allergies as of 07/31/2021 - Review Complete 07/18/2021  Allergen Reaction Noted   Peanut butter flavor Anaphylaxis and Swelling 07/15/2020   Lisinopril Cough 06/13/2020    Family History  Problem Relation Age of Onset   Heart disease Mother        a. ?valve   Heart failure Brother    Hypertension Brother     Social History   Socioeconomic History   Marital status: Divorced    Spouse name: Not  on file   Number of children: Not on file   Years of education: Not on file   Highest education level: Not on file  Occupational History   Occupation: on short term disability  Tobacco Use   Smoking status: Former   Smokeless tobacco: Never   Tobacco comments:    Quit 30 years ago  Media planner   Vaping Use: Never used  Substance and Sexual Activity   Alcohol use: Yes    Comment: social   Drug use: No   Sexual activity: Not on file  Other Topics Concern   Not on file  Social History Narrative   Lives with boyfriend   Right Handed   Drinks no caffeine daily   Social Determinants of Health   Financial Resource Strain: High Risk   Difficulty of Paying Living Expenses: Very hard  Food Insecurity: Landscape architect Present   Worried About Charity fundraiser in the Last Year: Sometimes true   Arboriculturist in the Last Year: Sometimes true  Transportation Needs: Unmet Transportation Needs   Lack of Transportation (Medical): No   Lack of Transportation (Non-Medical): Yes  Physical Activity: Not on file  Stress: Not on file  Social Connections: Not on file  Intimate Partner Violence: Not on file    Review of Systems: See HPI, otherwise negative ROS  Physical Exam: BP 139/64    Pulse 60    Temp (!) 96.3 F (35.7 C) (Temporal)    Resp 18    Ht 5\' 6"  (1.676 m)    Wt 89.4 kg    LMP 11/21/2020    SpO2 100%    BMI 31.80 kg/m  General:   Alert,  pleasant and cooperative in NAD Head:  Normocephalic and atraumatic. Neck:  Supple; no masses or thyromegaly. Lungs:  Clear throughout to auscultation.    Heart:  Regular rate and rhythm. Abdomen:  Soft, nontender and nondistended. Normal bowel sounds, without guarding, and without rebound.   Neurologic:  Alert and  oriented x4;  grossly normal neurologically.  Impression/Plan: Stacey Richardson is here for an colonoscopy to be performed for colon cancer screening  Risks, benefits, limitations, and alternatives regarding  colonoscopy  have been reviewed with the patient.  Questions have been answered.  All parties agreeable.   Sherri Sear, MD  09/14/2021, 9:13 AM

## 2021-09-14 NOTE — Anesthesia Postprocedure Evaluation (Signed)
Anesthesia Post Note  Patient: Stacey Richardson  Procedure(s) Performed: COLONOSCOPY WITH PROPOFOL  Anesthesia Type: General Anesthetic complications: no   There were no known notable events for this encounter.   Last Vitals:  Vitals:   09/14/21 0900 09/14/21 0949  BP: 139/64 121/66  Pulse: 60 78  Resp: 18 13  Temp: (!) 35.7 C (!) 36.1 C  SpO2: 100% 100%    Last Pain:  Vitals:   09/14/21 0949  TempSrc: Temporal  PainSc: Asleep                 Frederic Blas

## 2021-09-17 ENCOUNTER — Encounter: Payer: Self-pay | Admitting: Gastroenterology

## 2021-09-17 LAB — SURGICAL PATHOLOGY

## 2021-09-17 NOTE — Progress Notes (Signed)
Remote ICD transmission.   

## 2021-09-18 ENCOUNTER — Encounter: Payer: Self-pay | Admitting: Gastroenterology

## 2021-09-26 ENCOUNTER — Ambulatory Visit (INDEPENDENT_AMBULATORY_CARE_PROVIDER_SITE_OTHER): Payer: Self-pay | Admitting: Family Medicine

## 2021-10-01 ENCOUNTER — Other Ambulatory Visit: Payer: Self-pay

## 2021-10-01 ENCOUNTER — Ambulatory Visit (INDEPENDENT_AMBULATORY_CARE_PROVIDER_SITE_OTHER): Payer: Medicaid Other

## 2021-10-01 DIAGNOSIS — I5022 Chronic systolic (congestive) heart failure: Secondary | ICD-10-CM

## 2021-10-01 LAB — ECHOCARDIOGRAM LIMITED
Area-P 1/2: 2.02 cm2
S' Lateral: 3.25 cm
Single Plane A4C EF: 49 %

## 2021-10-01 MED ORDER — PERFLUTREN LIPID MICROSPHERE
1.0000 mL | INTRAVENOUS | Status: AC | PRN
  Administered 2021-10-01: 2 mL via INTRAVENOUS

## 2021-10-02 ENCOUNTER — Encounter: Payer: Self-pay | Admitting: Adult Health

## 2021-10-02 ENCOUNTER — Ambulatory Visit: Payer: Medicaid Other | Admitting: Adult Health

## 2021-10-02 VITALS — BP 112/66 | HR 77 | Ht 66.0 in | Wt 196.0 lb

## 2021-10-02 DIAGNOSIS — I10 Essential (primary) hypertension: Secondary | ICD-10-CM | POA: Diagnosis not present

## 2021-10-02 DIAGNOSIS — I639 Cerebral infarction, unspecified: Secondary | ICD-10-CM

## 2021-10-02 DIAGNOSIS — F068 Other specified mental disorders due to known physiological condition: Secondary | ICD-10-CM | POA: Diagnosis not present

## 2021-10-02 DIAGNOSIS — E785 Hyperlipidemia, unspecified: Secondary | ICD-10-CM

## 2021-10-02 DIAGNOSIS — E119 Type 2 diabetes mellitus without complications: Secondary | ICD-10-CM

## 2021-10-02 NOTE — Patient Instructions (Addendum)
Continue aspirin 81 mg daily  and atorvastatin  for secondary stroke prevention  Continue to follow up with PCP regarding cholesterol and blood pressure management  Maintain strict control of hypertension with blood pressure goal below 130/90 and cholesterol with LDL cholesterol (bad cholesterol) goal below 70 mg/dL.   Signs of a Stroke? Follow the BEFAST method:  Balance Watch for a sudden loss of balance, trouble with coordination or vertigo Eyes Is there a sudden loss of vision in one or both eyes? Or double vision?  Face: Ask the person to smile. Does one side of the face droop or is it numb?  Arms: Ask the person to raise both arms. Does one arm drift downward? Is there weakness or numbness of a leg? Speech: Ask the person to repeat a simple phrase. Does the speech sound slurred/strange? Is the person confused ? Time: If you observe any of these signs, call 911.  You will be called by behavioral health and endocrinology - if you do not hear from them by mid next week, let me know           Thank you for coming to see [place] at [facility]. I hope we have been able to provide you high quality care today.  You may receive a patient satisfaction survey over the next few weeks. We would appreciate your feedback and comments so that we may continue to improve ourselves and the health of our patients.

## 2021-10-02 NOTE — Progress Notes (Signed)
[facility] [address] [place]. [place] [address] [phone]       OFFICE FOLLOW UP NOTE  Stacey Richardson Date of Birth:  [birth year] Medical Record Number:  [id number]   Referring MD: Lars Mage  Reason for Referral: Stroke  Chief Complaint  Patient presents with   Follow-up    RM 3 alone Pt is well and stable, no new stroke concerns       HPI:   Update 10/02/2021 JM: returns for 38-month stroke follow-up unaccompanied.  Overall stable without new or reoccurring stroke/TIA symptoms. Did have a headache beginning of this month that persisted for 2 weeks - was found to have tooth infection - headache resolved shortly after treatment.  Compliant on aspirin and atorvastatin without side effects.  Blood pressure today 112/62. Monitors at home and typically 120s/60s. Glucose levels uncontrolled - did have A1c 2 months ago but unsure of results (unable to view via epic). She is frustrated by weight gain and having difficulty controlling diabetes.  Routinely follows with cardiology and PCP. Repeat 2D echo yesterday showed improved EF at 45-50% (prior 30 to 35%). Does endorse continued fear with anxiety and depression regarding current health status. 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 No further concerns at this time.     History provided for reference purposes only  Update 03/26/2021 JM: Stacey Richardson is being seen for stroke follow up unaccompanied.  She has been stable since prior visit without new or reoccurring stroke/TIA symptoms.  Reports compliance on aspirin and atorvastatin without associated side effects.  Blood pressure today 126/74.  She is concerned regarding gradual weight gain as well as occasional lightheadedness.  She has follow-up visit tomorrow with cardiology and plans on further discussing lightheadedness during visit.  She has since completed cardiac rehab.  She does report increased stressors as she is having difficulty obtaining medical insurance  as she is not currently working.  No further concerns at this time.  Initial visit 12/11/2020 Dr. Leonie Man: Stacey Richardson is a 56 year old African-American lady seen today for initial office consultation visit for stroke.  History is obtained from the patient and review of electronic medical records and I personally reviewed imaging films in PACS.  She has past medical history of diabetes hypertension mild obesity.  She presented on 06/12/2020 with sudden onset of dizziness and dropping food plate from the right hand.  She felt faint hearted and she had just received some bad news about her boyfriend being involved in a car accident.  When she came to the ED she had trouble speaking to the triage providers and was not able to get her words out but was able to text and explained that she was having trouble speaking.  Blood pressure slightly elevated.  CT scan of the head was unremarkable.  MRI scan showed a tiny punctate left frontal insular infarct.  MR angiogram of the brain and neck were both unremarkable.  She subsequently had outpatient transcranial Doppler bubble study done on 08/25/2020 which was done which showed medium size right-to-left shunt however a TEE on 07/18/2020 was negative for PFO or cardiac source of embolism.  An echocardiogram on 11/28/2020 which showed diminished ejection fraction of 30 to 35%.  She had a ICD implantation done on 12/05/2020 by Dr. Quentin Ore with removal of implantable loop recorder..  Patient states she has had no further recurrent stroke or TIA symptoms.  Blood pressures well controlled today it is 128/72.  She remains on aspirin and Plavix  which is tolerating well without bruising or bleeding.  Blood sugars have ranged in the 130s fasting usually.  She cannot tell me what her last A1c or lipid profile was.  She has no new complaints today.  ROS:   14 system review of systems is positive for those listed in HPI only all other systems negative  PMH:  Past Medical History:  Diagnosis  Date   CAD (coronary artery disease)    CABG (07/2020; LIMA-LAD, RIMA-OM1)   CVA (cerebral vascular accident) ([place])    Diabetes mellitus    HFrEF (heart failure with reduced ejection fraction) ([place])    HLD (hyperlipidemia)    Hypertension    SVT (supraventricular tachycardia) ([place])    s/p ablation (11/2020)    Social History:  Social History   Socioeconomic History   Marital status: Divorced    Spouse name: Not on file   Number of children: Not on file   Years of education: Not on file   Highest education level: Not on file  Occupational History   Occupation: on short term disability  Tobacco Use   Smoking status: Former   Smokeless tobacco: Never   Tobacco comments:    Quit 30 years ago  Electronics engineer Use   Vaping Use: Never used  Substance and Sexual Activity   Alcohol use: Yes    Comment: social   Drug use: No   Sexual activity: Not on file  Other Topics Concern   Not on file  Social History Narrative   Lives with boyfriend   Right Handed   Drinks no caffeine daily   Social Determinants of Health   Financial Resource Strain: High Risk   Difficulty of Paying Living Expenses: Very hard  Food Insecurity: Landscape architect Present   Worried About Charity fundraiser in the Last Year: Sometimes true   Arboriculturist in the Last Year: Sometimes true  Transportation Needs: Unmet Transportation Needs   Lack of Transportation (Medical): No   Lack of Transportation (Non-Medical): Yes  Physical Activity: Not on file  Stress: Not on file  Social Connections: Not on file  Intimate Partner Violence: Not on file    Medications:   Current Outpatient Medications on File Prior to Visit  Medication Sig Dispense Refill   acetaminophen (TYLENOL) 500 MG tablet Take 500 mg by mouth every 6 (six) hours as needed for moderate pain or headache.     alum & mag hydroxide-simeth (MAALOX ADVANCED MAX ST) 400-400-40 MG/5ML suspension Take 15 mLs by mouth every 6 (six) hours as needed for  indigestion. 355 mL 0   aspirin EC 81 MG tablet Take 81 mg by mouth daily.      atorvastatin (LIPITOR) 80 MG tablet Take 80 mg by mouth at bedtime.     carvedilol (COREG) 12.5 MG tablet Take 1 tablet (12.5 mg total) by mouth 2 (two) times daily. 180 tablet 0   JARDIANCE 10 MG TABS tablet Take 10 mg by mouth daily.     sacubitril-valsartan (ENTRESTO) 24-26 MG Take 1 tablet by mouth 2 (two) times daily. 180 tablet 0   spironolactone (ALDACTONE) 25 MG tablet Take 0.5 tablets (12.5 mg total) by mouth daily. 45 tablet 0   tetrahydrozoline 0.05 % ophthalmic solution Place 1-2 drops into both eyes daily as needed (irritated eyes.). visine     No current facility-administered medications on file prior to visit.    Allergies:   Allergies  Allergen Reactions   Peanut Butter Flavor Anaphylaxis  and Swelling   Lisinopril Cough    Physical Exam Today's Vitals   10/02/21 1530  BP: 112/66  Pulse: 77  Weight: 196 lb (88.9 kg)  Height: 5\' 6"  (1.676 m)   Body mass index is 31.64 kg/m.  General: Obese very pleasant middle-age African-American lady seated, in no evident distress Head: head normocephalic and atraumatic.   Neck: supple with no carotid or supraclavicular bruits Cardiovascular: regular rate and rhythm, no murmurs Musculoskeletal: no deformity Skin:  no rash/petichiae Vascular:  Normal pulses all extremities  Neurologic Exam Mental Status: Awake and fully alert.  Fluent speech and language.  Oriented to place and time. Recent and remote memory intact. Attention span, concentration and fund of knowledge appropriate. Mood and affect appropriate.  Cranial Nerves: Pupils equal, briskly reactive to light. Extraocular movements full without nystagmus. Visual fields full to confrontation. Hearing intact. Facial sensation intact. Face, tongue, palate moves normally and symmetrically.  Motor: Normal bulk and tone. Normal strength in all tested extremity muscles. Sensory.: intact to touch ,  pinprick , position and vibratory sensation.  Coordination: Rapid alternating movements normal in all extremities. Finger-to-nose and heel-to-shin performed accurately bilaterally. Gait and Station: Arises from chair without difficulty. Stance is normal. Gait demonstrates normal stride length and balance .  Not able to heel, toe and tandem walk without difficulty.  Reflexes: 1+ and symmetric. Toes downgoing.       ASSESSMENT: 56 year old patient with episode of expressive aphasia due to left frontal MCA branch infarct of cryptogenic etiology without residual deficit.  Vascular risk factors of diabetes, hypertension hyperlipidemia, ischemic cardiomyopathy and mild obesity. Struggling with anxiety re: medical condition and having difficulty controlling diabetes.     PLAN:  -Continue aspirin 81 mg daily and atorvastatin for secondary stroke prevention  -advised to reschedule initial eval at healthy weight and wellness for further weight loss assistance -referral placed to psychology for counseling  -referral placed to endocrinology for diabetic assistance -maintain strict control of hypertension with blood pressure goal below 130/90, diabetes with hemoglobin A1c goal below 7% and lipids with LDL cholesterol goal below 70 mg/dL. Reports lab work 2 months ago by PCP - unable to view via epic - patient unsure of results      CC:  [name], [name], MD   I spent 33 minutes of face-to-face and non-face-to-face time with patient.  This included previsit chart review, lab review, study review, order entry, electronic health record documentation, patient education and discussion regarding history of prior stroke, secondary stroke prevention measures and aggressive stroke risk factor management, other concerns as noted above and answered all other questions to patient's satisfaction  Frann Rider, [facility]  [facility] [address] [place], [place]  [address]  Phone [phone] Fax [phone] Note: This document was prepared with digital dictation and possible smart phrase technology. Any transcriptional errors that result from this process are unintentional.

## 2021-10-03 ENCOUNTER — Telehealth: Payer: Self-pay | Admitting: Emergency Medicine

## 2021-10-03 ENCOUNTER — Ambulatory Visit (INDEPENDENT_AMBULATORY_CARE_PROVIDER_SITE_OTHER): Payer: Self-pay | Admitting: Family Medicine

## 2021-10-03 NOTE — Telephone Encounter (Signed)
Called patient to go over results of echo. No answer, unable to leave message.

## 2021-10-03 NOTE — Telephone Encounter (Signed)
-----   Message from Gibbs, PA-C sent at 10/03/2021 12:37 PM EST ----- Echo LVEF 45-50% which is improved since last echo. Overall good news, continue current medications.

## 2021-10-04 NOTE — Telephone Encounter (Signed)
Patient calling to discuss recent testing results  ° °Please call  ° °

## 2021-10-05 NOTE — Telephone Encounter (Signed)
The patient has been notified of the result and verbalized understanding.  All questions (if any) were answered.     

## 2021-10-05 NOTE — Telephone Encounter (Signed)
Attempted to reach pt, no answer. Left detailed message with results on pt's vm.  DPR approved to LDM.  Asked pt to call back with any additional questions.

## 2021-10-06 IMAGING — CR DG CHEST 2V
2 series · 2 of 2 positions shown · non-contrast
Comparison: 09/06/2020

CLINICAL DATA: Status post ICD placement

EXAM:
CHEST - 2 VIEW

[w chest pa]
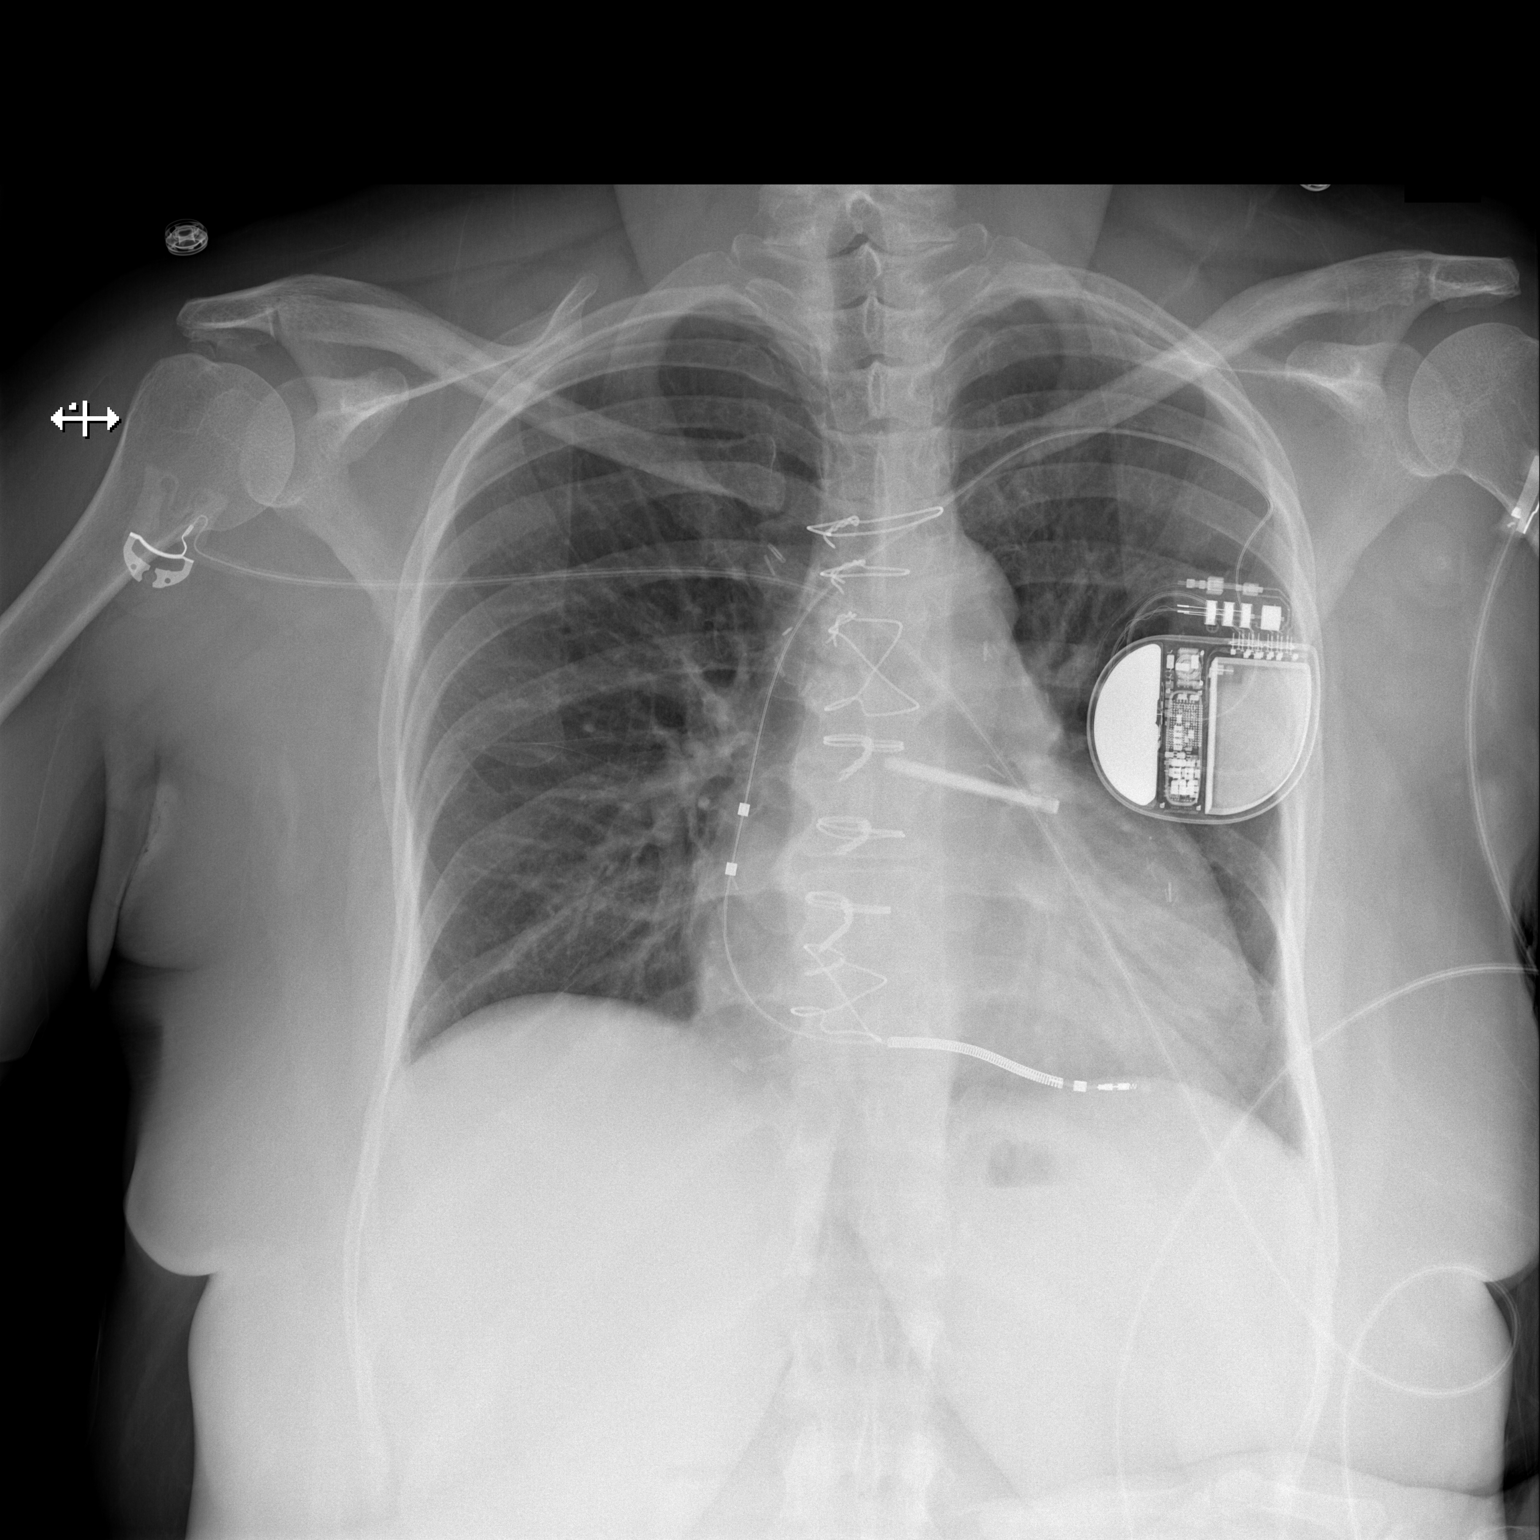

[w chest lat]
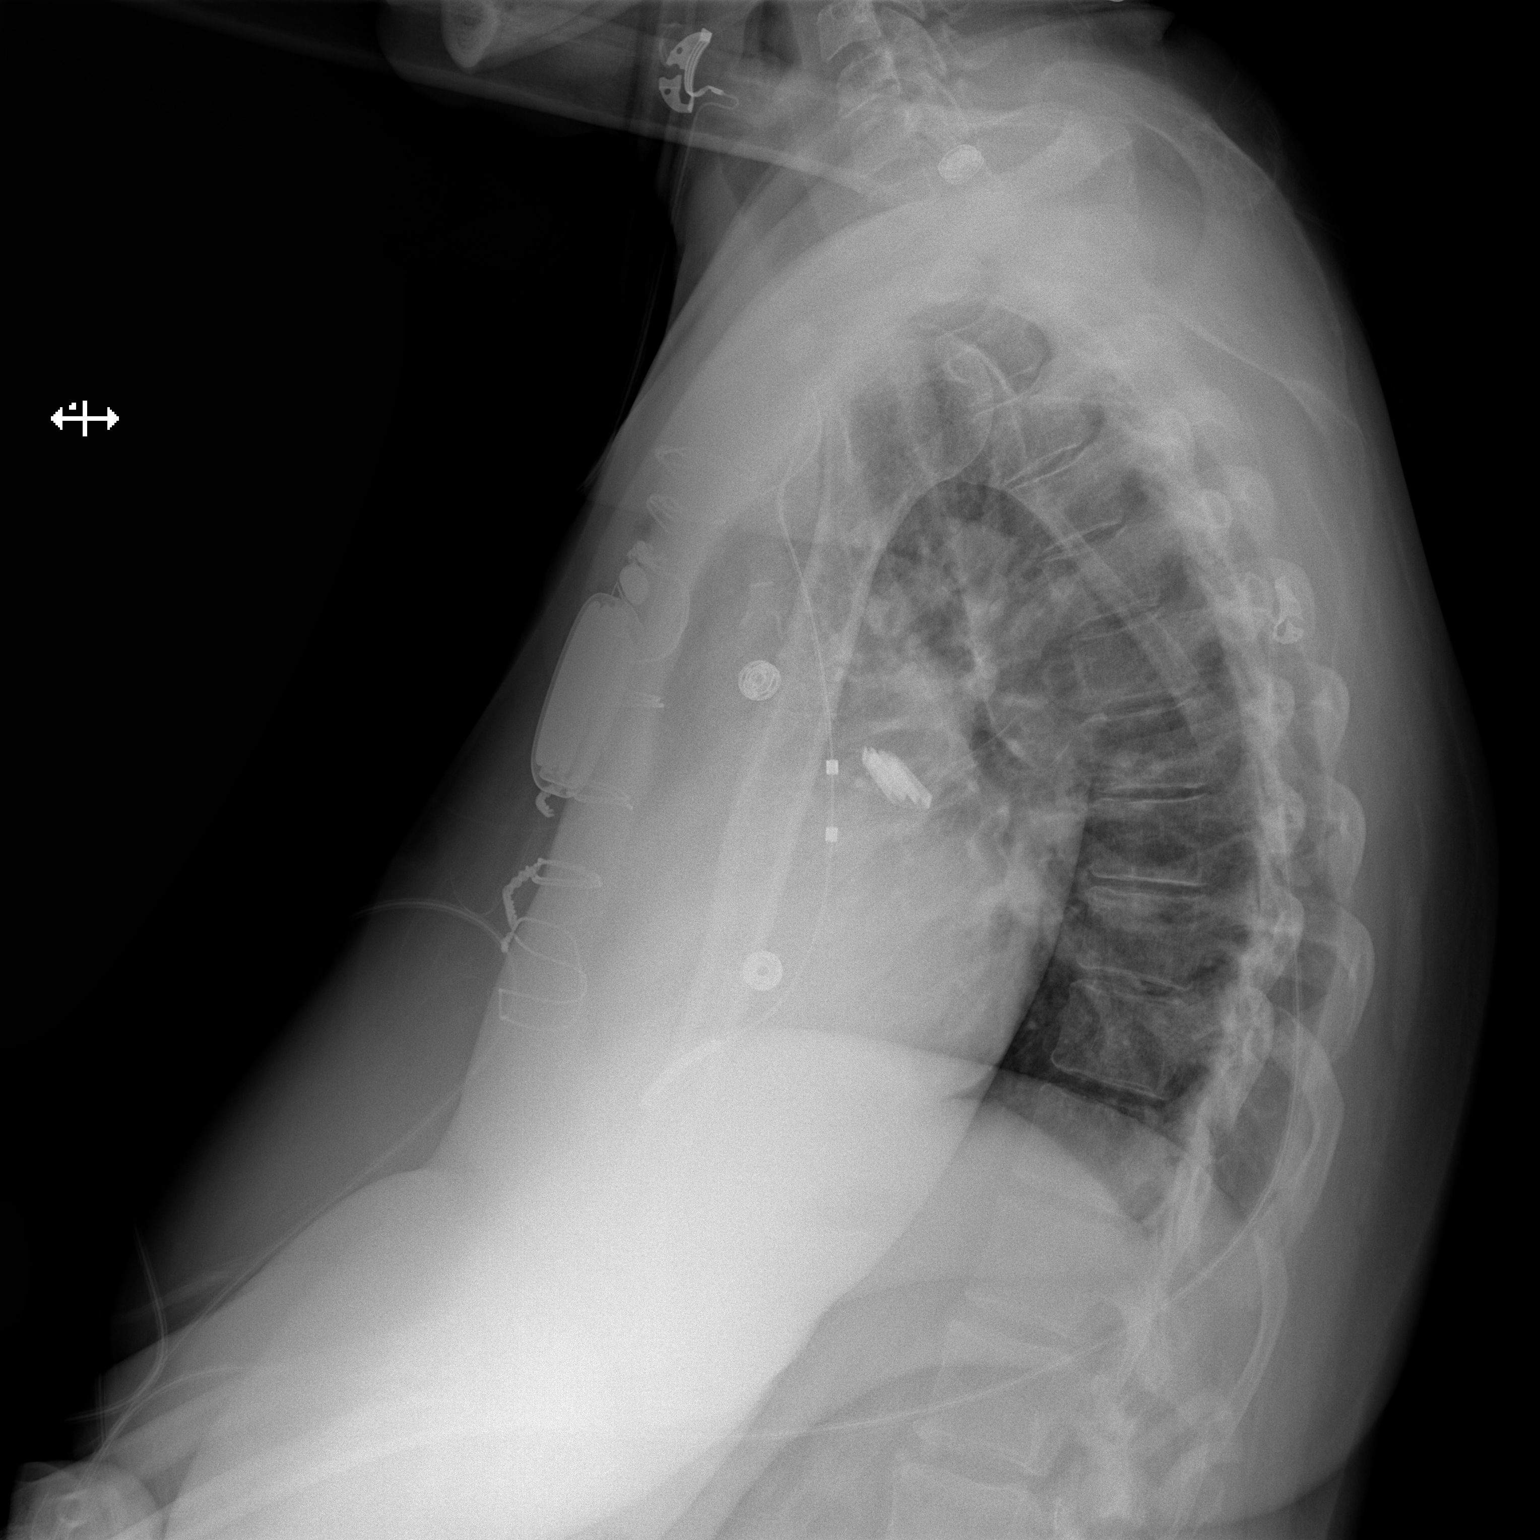

[2 of 2 positions shown; findings below may reference images not displayed]

FINDINGS: Interval placement of left chest single lead pacer defibrillator.
Mild cardiomegaly status post median sternotomy with left atrial
appendage clip. Both lungs are clear. The visualized skeletal
structures are unremarkable.
IMPRESSION: Interval placement of left chest single lead pacer defibrillator. No
acute abnormality of the lungs.

## 2021-10-08 ENCOUNTER — Telehealth (HOSPITAL_COMMUNITY): Payer: Self-pay | Admitting: Licensed Clinical Social Worker

## 2021-10-08 NOTE — Telephone Encounter (Signed)
CSW called pt to remind of [facility] this coming Wednesday at 10am- pt aware and will try to come if able  Jorge Ny, [place] [facility]er [facility] Desk#: [phone] Cell#: [phone]

## 2021-10-10 ENCOUNTER — Ambulatory Visit (INDEPENDENT_AMBULATORY_CARE_PROVIDER_SITE_OTHER): Payer: Self-pay | Admitting: Family Medicine

## 2021-10-17 ENCOUNTER — Ambulatory Visit (INDEPENDENT_AMBULATORY_CARE_PROVIDER_SITE_OTHER): Payer: Self-pay | Admitting: Family Medicine

## 2021-10-25 ENCOUNTER — Telehealth: Payer: Self-pay | Admitting: Internal Medicine

## 2021-10-25 NOTE — Telephone Encounter (Signed)
Pt c/o medication issue:  1. Name of Medication:  esitalapram  2. How are you currently taking this medication (dosage and times per day)? New start per pcp   3. Are you having a reaction (difficulty breathing--STAT)? Not started   4. What is your medication issue? Patient given this med for "mood" and wants to make sure safe with card meds and card dx

## 2021-10-25 NOTE — Telephone Encounter (Signed)
Ok to take escitalopram, no major drug interactions with her other meds and her QTc is stable on EKGs.

## 2021-10-25 NOTE — Telephone Encounter (Signed)
Called and notified pt recc below.  Pt appreciative and has no further questions.

## 2021-11-04 ENCOUNTER — Other Ambulatory Visit: Payer: Self-pay | Admitting: Internal Medicine

## 2021-11-04 DIAGNOSIS — I5022 Chronic systolic (congestive) heart failure: Secondary | ICD-10-CM

## 2021-11-04 DIAGNOSIS — I10 Essential (primary) hypertension: Secondary | ICD-10-CM

## 2021-11-21 ENCOUNTER — Telehealth: Payer: Self-pay | Admitting: Internal Medicine

## 2021-11-21 NOTE — Telephone Encounter (Signed)
Spoke with the pt. Pt reports "pinching" type pain near ICD site that began yesterday. (Note to below message - correction that pt did not have ICD placed this month, it was placed March 2022). Pt also notes pain can be felt under left breast. Pt noticed pain while lying on her right side yesterday, resolved after changed positions. Reports pinching sensation is intermittent. Sometimes she notices with movement and resolves sometimes with change in position. Usually pain lasts ~ 2-3 minutes per pt. Pt denies shortness of breath, dizziness or any other associated s/s.  ? ?Pt currently scheduled for follow up with Cadence Kathlen Mody, NP 12/03/21 and Dr. Quentin Ore 12/05/21. ? ?Pt denies any pain or symptoms at this time.  ?Notified pt I will notify the device clinic to discuss doing remote transmission.  ? ?

## 2021-11-21 NOTE — Telephone Encounter (Signed)
Pt c/o of Chest Pain: STAT if CP now or developed within 24 hours ? ?1. Are you having CP right now? No  ? ?2. Are you experiencing any other symptoms (ex. SOB, nausea, vomiting, sweating)? Pinching pain near defib site recently implanted this month denies wound issues states this  same pain happened on previous episodes  ? ?3. How long have you been experiencing CP? Since yesterday  ? ?4. Is your CP continuous or coming and going? Comes and goes  ? ?5. Have you taken Nitroglycerin?  No  ?? ?Patient states she is not currently in pain at the moment and wants to know how to determine if this or other episodes are urgent .  Patient expresses she is scared and unsure of what to do.  ?

## 2021-11-21 NOTE — Telephone Encounter (Signed)
Unsuccessful telephone encounter to patient to follow on complaint of "pinching" near ICD site. Hipaa compliant VM message left requesting call back to [phone]. Patient has biotronik ICD and remotes nightly. No abnormality noted on most recent transmission 11/21/21.  ?

## 2021-11-22 NOTE — Telephone Encounter (Signed)
Attempted to reach patient, unable to leave a voicemail, monitor updated 11/22/21 0134AM Single chamber ICD  normal device function, no ICD therapies, patient does not V-pace. Nothing from ICD standpoint at this time to explain symptoms, closing encounter.  ?

## 2021-12-03 ENCOUNTER — Ambulatory Visit: Payer: Medicaid Other | Admitting: Medical

## 2021-12-03 NOTE — Progress Notes (Deleted)
?Cardiology Office Note:   ? ?Date:  12/03/2021  ? ?ID:  Stacey Richardson, DOB 09/13/1965, MRN [id number] ? ?PCP:  Theotis Burrow, MD  ?[facility] Cardiologist:  [name], MD  ?[facility] Electrophysiologist:  Vickie Epley, MD  ? ?Referring MD: Theotis Burrow*  ? ?Chief Complaint: *** ? ?History of Present Illness:   ? ?Stacey Richardson is a 56 y.o. female with a hx of stroke, HFrEF due to ICM, CAD s/p CABG (LIMA-LAD, RIMA-OM1), HTN, HLD, DM2, obesity, SVT s/p ablation with slow pathway modification and ICD implantation on March 29,2022.  ?  ?Hospitalized 06/12/20 with acute speech difficulty and dysphagia. Diagnosed with left frontal lobe infarct. Echo showed reduced LVEF 35-40%. Started on coreg, losartan, aspirin, plavix, rosuvastatin.  ?  ?At follow-up 06/19/20 noted DOE and Zio showed afib/flutter, brief episodes of Wenckebach. Coreg was discontinued.  ?  ?Ed visit 07/11/20 with MRI with small subacute appearing white matter infarct in left corona radiata new compared to previous, in same vicinity as prior ischemia as well as tiny area of cortical encephalomalacia corresponding to left operculum infarct last month. Reviewed by neurology with notation of subacute stroke recommended for outpatient follow up and to continue aspirin.  ?  ?Underwent TEE 07/18/20 for evaluation of possible PFO with no evidence of thrombus, EF 30-35%, negative bubble study, though notation of question small tunnel in interatrial septum. She was recommended for [facility] to rule out ischemia as etiology of heart failure which was performed 08/02/20 showing severe ostial LMCA stenosis and mild nonobstructive coronary artery disease of LAD and LCx. She was transferred to [facility] for CABG which was performed 08/06/20 with LIMA-LAD, RIMA-OM1 and left atrial appendage clipping. For further evaluation of possible PFO transcranial doppler with bubbles was recommended and positive. On review by Dr. Saunders Revel and Dr.  Burt Knack, no indication for PFO closure as no clear evidence that PFO exists. At follow up with Dr. Saunders Revel 10/11/20 low dose Losartan as well as Coreg were initiated for optimization of heart failure therapies. She was seen by Dr. Quentin Ore 10/18/20 and loop recorder place and discussion initiated regarding ICD for HFrEF as well as potential EP study due to history of SVT. She had an updated echo 11/08/20 LVEF 30-35%, LV global longitudinal strain -12.2%, mild MR. ?  ?Last seen 01/10/21  by EP and was doing well, no shocks and no recurrent SVT.  ?  ?Seen 04/20/21 and reported occasional lightheadedness, orthostatics negative. Losartan was switched to [facility].  ?  ?Seen in the ED 07/18/21 for near syncope.  ? ?Seen in the office 08/20/21 for ER follow-up. Orthostatics were negative. No changes were made.  ? ?Today,  ?  ? ?Past Medical History:  ?Diagnosis Date  ? CAD (coronary artery disease)   ? CABG (07/2020; LIMA-LAD, RIMA-OM1)  ? CVA (cerebral vascular accident) [facility])   ? Diabetes mellitus   ? HFrEF (heart failure with reduced ejection fraction) ([place])   ? HLD (hyperlipidemia)   ? Hypertension   ? SVT (supraventricular tachycardia) ([place])   ? s/p ablation (11/2020)  ? ? ?Past Surgical History:  ?Procedure Laterality Date  ? CARDIAC CATHETERIZATION    ? CLIPPING OF ATRIAL APPENDAGE N/A 08/06/2020  ? Procedure: CLIPPING OF ATRIAL APPENDAGE USING ATRICURE CLIP SIZE 40MM;  Surgeon: Wonda Olds, MD;  Location: [place];  Service: Open Heart Surgery;  Laterality: N/A;  ? COLONOSCOPY WITH PROPOFOL N/A 09/14/2021  ? Procedure: COLONOSCOPY WITH PROPOFOL;  Surgeon: Sherri Sear  Reece Levy, MD;  Location: [place];  Service: Gastroenterology;  Laterality: N/A;  ? CORONARY ARTERY BYPASS GRAFT N/A 08/06/2020  ? Procedure: CORONARY ARTERY BYPASS GRAFTING (CABG) X2, USING BILATERAL INTERNAL MAMMARY ARTERIES;  Surgeon: Wonda Olds, MD;  Location: [facility] OR;  Service: Open Heart Surgery;  Laterality: N/A;  ? ICD IMPLANT N/A 12/05/2020  ?  Procedure: ICD IMPLANT;  Surgeon: Vickie Epley, MD;  Location: [place] CV LAB;  Service: Cardiovascular;  Laterality: N/A;  ? KNEE SURGERY    ? RIGHT/LEFT HEART CATH AND CORONARY ANGIOGRAPHY Bilateral 08/02/2020  ? Procedure: RIGHT/LEFT HEART CATH AND CORONARY ANGIOGRAPHY;  Surgeon: [name], MD;  Location: [place] CV LAB;  Service: Cardiovascular;  Laterality: Bilateral;  ? SVT ABLATION N/A 12/05/2020  ? Procedure: SVT ABLATION;  Surgeon: Vickie Epley, MD;  Location: [place] CV LAB;  Service: Cardiovascular;  Laterality: N/A;  ? TEE WITHOUT CARDIOVERSION N/A 07/18/2020  ? Procedure: TRANSESOPHAGEAL ECHOCARDIOGRAM (TEE);  Surgeon: [name], MD;  Location: [facility] ORS;  Service: Cardiovascular;  Laterality: N/A;  ? TEE WITHOUT CARDIOVERSION N/A 08/06/2020  ? Procedure: TRANSESOPHAGEAL ECHOCARDIOGRAM (TEE);  Surgeon: Wonda Olds, MD;  Location: [place];  Service: Open Heart Surgery;  Laterality: N/A;  ? ? ?Current Medications: ?No outpatient medications have been marked as taking for the 12/03/21 encounter (Appointment) with Kathlen Mody, Stacey Chavarin Richardson, Stacey Richardson.  ?  ? ?Allergies:   Peanut butter flavor and Lisinopril  ? ?Social History  ? ?Socioeconomic History  ? Marital status: Divorced  ?  Spouse name: Not on file  ? Number of children: Not on file  ? Years of education: Not on file  ? Highest education level: Not on file  ?Occupational History  ? Occupation: on short term disability  ?Tobacco Use  ? Smoking status: Former  ? Smokeless tobacco: Never  ? Tobacco comments:  ?  Quit 30 years ago  ?Vaping Use  ? Vaping Use: Never used  ?Substance and Sexual Activity  ? Alcohol use: Yes  ?  Comment: social  ? Drug use: No  ? Sexual activity: Not on file  ?Other Topics Concern  ? Not on file  ?Social History Narrative  ? Lives with boyfriend  ? Right Handed  ? Drinks no caffeine daily  ? ?Social Determinants of Health  ? ?Financial Resource Strain: High Risk  ? Difficulty of Paying Living  Expenses: Very hard  ?Food Insecurity: Food Insecurity Present  ? Worried About Charity fundraiser in the Last Year: Sometimes true  ? Ran Out of Food in the Last Year: Sometimes true  ?Transportation Needs: Unmet Transportation Needs  ? Lack of Transportation (Medical): No  ? Lack of Transportation (Non-Medical): Yes  ?Physical Activity: Not on file  ?Stress: Not on file  ?Social Connections: Not on file  ?  ? ?Family History: ?The patient's family history includes Heart disease in her mother; Heart failure in her brother; Hypertension in her brother. ? ?ROS:   ?Please see the history of present illness.    ? All other systems reviewed and are negative. ? ?EKGs/Labs/Other Studies Reviewed:   ? ?The following studies were reviewed today: ? ?Limited Echo 11/2020 ? 1. Left ventricular ejection fraction, by estimation, is 30 to 35%. The  ?left ventricle has moderate to severely decreased function. The average  ?left ventricular global longitudinal strain is -12.1 %. The global  ?longitudinal strain is abnormal.  ? 2. The mitral valve is normal in structure. Mild mitral valve  ?  regurgitation.  ? 3. The aortic valve was not well visualized. Aortic valve regurgitation  ?is not visualized.  ?  ?Cardiac cath 2021 ?Conclusions: ?Severe ostial LMCA stenosis (~80%) with pressure dampening of 65F diagnostic catheter.  There is no significant improvement with intracoronary nitroglycerin. ?Mild, non-obstructive coronary artery disease involving LAD and dominant LCx. ?Normal left and right heart filling pressures. ?Normal Fick cardiac output/index. ?Right radial artery loop. ?  ?Recommendations: ?Transfer to [facility] for cardiac surgery consultation.  If the patient is not a surgical candidate, her LMCA stenosis is treatable percutaneously, though this would require hemodynamic support given reduced LVEF and left-dominant system. ?Hold clopidogrel pending cardiac surgery evaluation. ?Avoid right radial artery access for future  catheterization. ?Aggressive secondary prevention of coronary artery disease. ?  ?[name], MD ?[place] ?  ?Echo 06/2020 ? 1. Left ventricular ejection fraction, by estimation, is 35 to 40%. The  ?left ve

## 2021-12-05 ENCOUNTER — Ambulatory Visit (INDEPENDENT_AMBULATORY_CARE_PROVIDER_SITE_OTHER): Payer: Medicaid Other

## 2021-12-05 ENCOUNTER — Other Ambulatory Visit: Payer: Self-pay | Admitting: Family Medicine

## 2021-12-05 DIAGNOSIS — I255 Ischemic cardiomyopathy: Secondary | ICD-10-CM

## 2021-12-05 DIAGNOSIS — Z1231 Encounter for screening mammogram for malignant neoplasm of breast: Secondary | ICD-10-CM

## 2021-12-05 DIAGNOSIS — I639 Cerebral infarction, unspecified: Secondary | ICD-10-CM

## 2021-12-05 LAB — CUP PACEART REMOTE DEVICE CHECK
Date Time Interrogation Session: 20230328091655
Implantable Lead Implant Date: 20220329
Implantable Lead Location: 753860
Implantable Lead Model: 436909
Implantable Lead Serial Number: 81437635
Implantable Pulse Generator Implant Date: 20220329
Pulse Gen Model: 429525
Pulse Gen Serial Number: 84824217

## 2021-12-12 ENCOUNTER — Telehealth: Payer: Self-pay | Admitting: Licensed Clinical Social Worker

## 2021-12-12 NOTE — Telephone Encounter (Signed)
Reminder regarding Medicaid re-certification process was mailed to pt, encouraging her to ensure that an up to date phone number, address and email is registered with DSS so that she receives re-certification paperwork.  ?  ?Westley Hummer, MSW, LCSW ?Clinical Social Worker II ?[facility]  ?[phone]- work cell phone (preferred) ?[phone]- desk phone ?

## 2021-12-13 ENCOUNTER — Encounter (HOSPITAL_COMMUNITY): Payer: Self-pay

## 2021-12-13 ENCOUNTER — Other Ambulatory Visit: Payer: Self-pay

## 2021-12-13 ENCOUNTER — Emergency Department (HOSPITAL_COMMUNITY): Payer: Medicaid Other

## 2021-12-13 ENCOUNTER — Emergency Department (HOSPITAL_COMMUNITY)
Admission: EM | Admit: 2021-12-13 | Discharge: 2021-12-14 | Disposition: A | Discharge status: Home / Self Care | Payer: Medicaid Other | Attending: Emergency Medicine | Admitting: Emergency Medicine

## 2021-12-13 DIAGNOSIS — R109 Unspecified abdominal pain: Secondary | ICD-10-CM | POA: Diagnosis present

## 2021-12-13 DIAGNOSIS — E278 Other specified disorders of adrenal gland: Secondary | ICD-10-CM

## 2021-12-13 DIAGNOSIS — N83202 Unspecified ovarian cyst, left side: Secondary | ICD-10-CM

## 2021-12-13 DIAGNOSIS — D259 Leiomyoma of uterus, unspecified: Secondary | ICD-10-CM

## 2021-12-13 DIAGNOSIS — K5909 Other constipation: Secondary | ICD-10-CM

## 2021-12-13 DIAGNOSIS — R110 Nausea: Secondary | ICD-10-CM | POA: Diagnosis not present

## 2021-12-13 DIAGNOSIS — E279 Disorder of adrenal gland, unspecified: Secondary | ICD-10-CM

## 2021-12-13 DIAGNOSIS — K8020 Calculus of gallbladder without cholecystitis without obstruction: Secondary | ICD-10-CM

## 2021-12-13 DIAGNOSIS — R1084 Generalized abdominal pain: Secondary | ICD-10-CM

## 2021-12-13 LAB — URINALYSIS, ROUTINE W REFLEX MICROSCOPIC
Bacteria, UA: NONE SEEN
Bilirubin Urine: NEGATIVE
Glucose, UA: >=500 mg/dL — AB
Ketones, ur: NEGATIVE mg/dL
Leukocytes,Ua: NEGATIVE
Nitrite: NEGATIVE
Protein, ur: NEGATIVE mg/dL
Specific Gravity, Urine: 1.023 (ref 1.005–1.030)
pH: 7 (ref 5.0–8.0)

## 2021-12-13 LAB — COMPREHENSIVE METABOLIC PANEL
ALT: 17 U/L (ref 0–44)
AST: 16 U/L (ref 15–41)
Albumin: 3.9 g/dL (ref 3.5–5.0)
Alkaline Phosphatase: 66 U/L (ref 38–126)
Anion gap: 7 (ref 5–15)
BUN: 19 mg/dL (ref 6–20)
CO2: 26 mmol/L (ref 22–32)
Calcium: 9.3 mg/dL (ref 8.9–10.3)
Chloride: 105 mmol/L (ref 98–111)
Creatinine, Ser: 0.76 mg/dL (ref 0.44–1.00)
GFR, Estimated: >60 mL/min (ref >60)
Glucose, Bld: 260 mg/dL — ABNORMAL HIGH (ref 70–99)
Potassium: 3.7 mmol/L (ref 3.5–5.1)
Sodium: 138 mmol/L (ref 135–145)
Total Bilirubin: 0.3 mg/dL (ref 0.3–1.2)
Total Protein: 7.3 g/dL (ref 6.5–8.1)

## 2021-12-13 LAB — CBC
HCT: 44.1 % (ref 36.0–46.0)
Hemoglobin: 14.1 g/dL (ref 12.0–15.0)
MCH: 28.1 pg (ref 26.0–34.0)
MCHC: 32 g/dL (ref 30.0–36.0)
MCV: 88 fL (ref 80.0–100.0)
Platelets: 280 10*3/uL (ref 150–400)
RBC: 5.01 MIL/uL (ref 3.87–5.11)
RDW: 13.7 % (ref 11.5–15.5)
WBC: 6.9 10*3/uL (ref 4.0–10.5)
nRBC: 0 % (ref 0.0–0.2)

## 2021-12-13 LAB — LIPASE, BLOOD: Lipase: 46 U/L (ref 11–51)

## 2021-12-13 MED ORDER — IOHEXOL 300 MG/ML  SOLN
100.0000 mL | Freq: Once | INTRAMUSCULAR | Status: AC | PRN
  Administered 2021-12-13: 100 mL via INTRAVENOUS

## 2021-12-13 NOTE — ED Provider Notes (Addendum)
?[facility] ?Provider Note ? ? ?CSN: [id number] ?Arrival date & time: 12/13/21  2044 ? ?  ? ?History ? ?Chief Complaint  ?Patient presents with  ? Abdominal Pain  ? ? ?Kaeleen KIERSTON PLASENCIA is a 56 y.o. female. ? ?Patient c/o abd pain/cramping, in past month, worse in past day. Symptoms gradual onset, moderate, persistent, non radiating, without specific exacerbating or alleviating factors. Indicates feels bloated, but not able to pass gas. +nausea now. No vomiting. No wt loss. In general has had normal appetite. No dysuria or hematuria. No vaginal discharge or bleeding. No pelvic area pain. No chest pain or chest discomfort. No sob. No fever or chills.  ? ?The history is provided by the patient and medical records.  ?Abdominal Pain ?Associated symptoms: nausea   ?Associated symptoms: no chest pain, no chills, no cough, no dysuria, no fever, no shortness of breath, no sore throat and no vomiting   ? ?  ? ?Home Medications ?Prior to Admission medications   ?Medication Sig Start Date End Date Taking? Authorizing Provider  ?acetaminophen (TYLENOL) 500 MG tablet Take 500 mg by mouth every 6 (six) hours as needed for moderate pain or headache.    [provider]  ?alum & mag hydroxide-simeth (MAALOX ADVANCED MAX ST) 409-811-91 MG/5ML suspension Take 15 mLs by mouth every 6 (six) hours as needed for indigestion. 12/09/20   Mesner, Corene Cornea, MD  ?aspirin EC 81 MG tablet Take 81 mg by mouth daily.     [provider]  ?atorvastatin (LIPITOR) 80 MG tablet Take 80 mg by mouth at bedtime. 07/14/20   [provider]  ?carvedilol (COREG) 12.5 MG tablet TAKE 1 TABLET BY MOUTH 2 TIMES DAILY. 11/05/21   End, Harrell Gave, MD  ?JARDIANCE 10 MG TABS tablet Take 10 mg by mouth daily. 04/05/21   [provider]  ?sacubitril-valsartan (ENTRESTO) 24-26 MG Take 1 tablet by mouth 2 (two) times daily. 06/08/21   Furth, Cadence H, PA-C  ?spironolactone (ALDACTONE) 25 MG tablet TAKE 1/2 TABLET BY MOUTH  EVERY DAY 11/05/21   End, Harrell Gave, MD  ?tetrahydrozoline 0.05 % ophthalmic solution Place 1-2 drops into both eyes daily as needed (irritated eyes.). visine    [provider]  ?   ? ?Allergies    ?Peanut butter flavor and Lisinopril   ? ?Review of Systems   ?Review of Systems  ?Constitutional:  Negative for chills and fever.  ?HENT:  Negative for sore throat.   ?Eyes:  Negative for redness.  ?Respiratory:  Negative for cough and shortness of breath.   ?Cardiovascular:  Negative for chest pain.  ?Gastrointestinal:  Positive for abdominal pain and nausea. Negative for vomiting.  ?Genitourinary:  Negative for dysuria and flank pain.  ?Musculoskeletal:  Negative for back pain and neck pain.  ?Skin:  Negative for rash.  ?Neurological:  Negative for headaches.  ?Hematological:  Does not bruise/bleed easily.  ?Psychiatric/Behavioral:  Negative for confusion.   ? ?Physical Exam ?Updated Vital Signs ?BP (!) 126/53   Pulse 80   Temp 98.2 ?F (36.8 ?C) (Oral)   Resp 19   Ht 1.651 m ('5\' 5"'$ )   Wt 89.4 kg   LMP 11/21/2020   SpO2 100%   BMI 32.78 kg/m?  ?Physical Exam ?Vitals and nursing note reviewed.  ?Constitutional:   ?   Appearance: Normal appearance. She is well-developed.  ?HENT:  ?   Head: Atraumatic.  ?   Nose: Nose normal.  ?   Mouth/Throat:  ?  Mouth: Mucous membranes are moist.  ?Eyes:  ?   General: No scleral icterus. ?   Conjunctiva/sclera: Conjunctivae normal.  ?Neck:  ?   Trachea: No tracheal deviation.  ?Cardiovascular:  ?   Rate and Rhythm: Normal rate and regular rhythm.  ?   Pulses: Normal pulses.  ?   Heart sounds: Normal heart sounds. No murmur heard. ?  No friction rub. No gallop.  ?Pulmonary:  ?   Effort: Pulmonary effort is normal. No respiratory distress.  ?   Breath sounds: Normal breath sounds.  ?Abdominal:  ?   General: Bowel sounds are normal. There is no distension.  ?   Palpations: Abdomen is soft.  ?   Tenderness: There is abdominal tenderness. There is no guarding.  ?    Comments: Mid abd tenderness.   ?Genitourinary: ?   Comments: No cva tenderness.  ?Musculoskeletal:     ?   General: No swelling.  ?   Cervical back: Normal range of motion and neck supple. No rigidity. No muscular tenderness.  ?Skin: ?   General: Skin is warm and dry.  ?   Findings: No rash.  ?Neurological:  ?   Mental Status: She is alert.  ?   Comments: Alert, speech normal.   ?Psychiatric:     ?   Mood and Affect: Mood normal.  ? ? ?ED Results / Procedures / Treatments   ?Labs ?(all labs ordered are listed, but only abnormal results are displayed) ?Results for orders placed or performed during the hospital encounter of 12/13/21  ?CBC  ?Result Value Ref Range  ? WBC 6.9 4.0 - 10.5 K/uL  ? RBC 5.01 3.87 - 5.11 MIL/uL  ? Hemoglobin 14.1 12.0 - 15.0 g/dL  ? HCT 44.1 36.0 - 46.0 %  ? MCV 88.0 80.0 - 100.0 fL  ? MCH 28.1 26.0 - 34.0 pg  ? MCHC 32.0 30.0 - 36.0 g/dL  ? RDW 13.7 11.5 - 15.5 %  ? Platelets 280 150 - 400 K/uL  ? nRBC 0.0 0.0 - 0.2 %  ?Comprehensive metabolic panel  ?Result Value Ref Range  ? Sodium 138 135 - 145 mmol/L  ? Potassium 3.7 3.5 - 5.1 mmol/L  ? Chloride 105 98 - 111 mmol/L  ? CO2 26 22 - 32 mmol/L  ? Glucose, Bld 260 (H) 70 - 99 mg/dL  ? BUN 19 6 - 20 mg/dL  ? Creatinine, Ser 0.76 0.44 - 1.00 mg/dL  ? Calcium 9.3 8.9 - 10.3 mg/dL  ? Total Protein 7.3 6.5 - 8.1 g/dL  ? Albumin 3.9 3.5 - 5.0 g/dL  ? AST 16 15 - 41 U/L  ? ALT 17 0 - 44 U/L  ? Alkaline Phosphatase 66 38 - 126 U/L  ? Total Bilirubin 0.3 0.3 - 1.2 mg/dL  ? GFR, Estimated >60 >60 mL/min  ? Anion gap 7 5 - 15  ?Lipase, blood  ?Result Value Ref Range  ? Lipase 46 11 - 51 U/L  ?Urinalysis, Routine w reflex microscopic Urine, Clean Catch  ?Result Value Ref Range  ? Color, Urine STRAW (A) YELLOW  ? APPearance CLEAR CLEAR  ? Specific Gravity, Urine 1.023 1.005 - 1.030  ? pH 7.0 5.0 - 8.0  ? Glucose, UA >=500 (A) NEGATIVE mg/dL  ? Hgb urine dipstick SMALL (A) NEGATIVE  ? Bilirubin Urine NEGATIVE NEGATIVE  ? Ketones, ur NEGATIVE NEGATIVE  mg/dL  ? Protein, ur NEGATIVE NEGATIVE mg/dL  ? Nitrite NEGATIVE NEGATIVE  ? Leukocytes,Ua NEGATIVE NEGATIVE  ?  RBC / HPF 0-5 0 - 5 RBC/hpf  ? WBC, UA 0-5 0 - 5 WBC/hpf  ? Bacteria, UA NONE SEEN NONE SEEN  ? Squamous Epithelial / LPF 0-5 0 - 5  ?Pregnancy, urine  ?Result Value Ref Range  ? Preg Test, Ur NEGATIVE NEGATIVE  ? ?CUP PACEART REMOTE DEVICE CHECK ? ?Result Date: 12/05/2021 ?Scheduled remote reviewed. Normal device function.  Next remote 91 days. [place]  ? ?EKG ?None ? ?Radiology ?1. Constipation and diverticulosis with no evidence of bowel  ?obstruction or inflammation.  ?2. Stomach distended with fluid and food products, could be due to a  ?recent meal or impaired gastric emptying.  ?3. New demonstration of a 1.5 x 0.9 cm indeterminate right adrenal  ?nodule. Adrenal dedicated workup recommended.  ?4. Slightly thick walled 2.7 cm cystic lesion of the left ovary.  ?Nonemergent routine ultrasound follow-up recommended. Also fibroid  ?uterus.  ?5. Increased aortic atherosclerosis since prior study.  No AAA.  ?6. Cholelithiasis.  ?7. Chronic bilateral sacroiliitis.  ?8. Interval CABG, pacemaker.  Mild cardiomegaly.  ? ?Procedures ?Procedures  ? ? ?Medications Ordered in ED ?Medications - No data to display ? ?ED Course/ Medical Decision Making/ A&P ?  ?                        ?Medical Decision Making ?Problems Addressed: ?Abdominal pain, generalized: acute illness or injury with systemic symptoms that poses a threat to life or bodily functions ?Adrenal nodule ([place]): undiagnosed new problem with uncertain prognosis ?   Details: f/u pcp ?Gallstones: chronic illness or injury ?   Details: f/u general surgeon ?Left ovarian cyst: undiagnosed new problem with uncertain prognosis ?   Details: f/u pcp/gyn ?Other constipation: acute illness or injury ?Uterine leiomyoma, unspecified location: chronic illness or injury ?   Details: f/u pcp/gyn ? ?Amount and/or Complexity of Data Reviewed ?External Data Reviewed: radiology  and notes. ?Labs: ordered. Decision-making details documented in ED Course. ?Radiology: ordered and independent interpretation performed. Decision-making details documented in ED Course. ? ?Risk ?Prescription drug managem

## 2021-12-13 NOTE — Discharge Instructions (Addendum)
It was our pleasure to provide your ER care today - we hope that you feel better.Drink plenty of fluids/stay well hydrated.  ? ?Your ct scan was noted to have several incidental findings: 1. Constipation and diverticulosis with no evidence of bowel obstruction or inflammation. 2. Stomach distended with fluid and food products, could be due to a recent meal or impaired gastric emptying. 3. New demonstration of a 1.5 x 0.9 cm indeterminate right adrenal nodule. Adrenal dedicated workup recommended. 4. Slightly thick walled 2.7 cm cystic lesion of the left ovary. Nonemergent routine ultrasound follow-up recommended. Also fibroid uterus. 5. Increased aortic atherosclerosis since prior study.  No AAA. 6. Cholelithiasis. 7. Chronic bilateral sacroiliitis. 8.  Mild cardiomegaly.  ? ?For constipation - drink plenty of fluids/water, get adequate fiber in diet, take colace (stool softener) 2x/day, and miralax (laxative) once per day - these meds are available over the counter.  For gas/bloating - try gas-x, phazyme, or other similar medication for symptom relief. Take acetaminophen as need.  ? ?Follow up with primary care doctor in the coming week. Make sure to discuss the CT findings above, including the adrenal nodule and ovarian cyst, with your doctor, and have them arrange follow up imaging/further evaluation.  For gallstones, follow up with general surgeon in the next few weeks.  ? ?Return to ER if worse, new symptoms, fevers, worsening/severe pain, persistent vomiting, or other concern.  ?

## 2021-12-13 NOTE — ED Triage Notes (Signed)
Pt presents to Ed with c/o lower abd pain and rectal pain that has been ongoing for about a month. Pt reports excessive gas.  ?

## 2021-12-14 LAB — PREGNANCY, URINE: Preg Test, Ur: NEGATIVE

## 2021-12-14 MED ORDER — METOCLOPRAMIDE HCL 5 MG/ML IJ SOLN
10.0000 mg | Freq: Once | INTRAMUSCULAR | Status: AC
  Administered 2021-12-14: 10 mg via INTRAVENOUS
  Filled 2021-12-14: qty 2

## 2021-12-18 NOTE — Progress Notes (Deleted)
?Electrophysiology Office Follow up Visit Note:   ? ?Date:  12/18/2021  ? ?ID:  Stacey Richardson, DOB [birth year], MRN [id number] ? ?PCP:  Theotis Burrow, MD  ?[facility] Cardiologist:  [name], MD  ?[facility] Electrophysiologist:  Vickie Epley, MD  ? ? ?Interval History:   ? ?Stacey Richardson is a 56 y.o. female who presents for a follow up visit.  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  She also has an ICD in situ for chronic systolic heart failure.  She presents for follow-up. ? ? ? ?  ? ?Past Medical History:  ?Diagnosis Date  ? CAD (coronary artery disease)   ? CABG (07/2020; LIMA-LAD, RIMA-OM1)  ? CVA (cerebral vascular accident) [facility])   ? Diabetes mellitus   ? HFrEF (heart failure with reduced ejection fraction) ([place])   ? HLD (hyperlipidemia)   ? Hypertension   ? SVT (supraventricular tachycardia) ([place])   ? s/p ablation (11/2020)  ? ? ?Past Surgical History:  ?Procedure Laterality Date  ? CARDIAC CATHETERIZATION    ? CLIPPING OF ATRIAL APPENDAGE N/A 08/06/2020  ? Procedure: CLIPPING OF ATRIAL APPENDAGE USING ATRICURE CLIP SIZE 40MM;  Surgeon: Wonda Olds, MD;  Location: [place];  Service: Open Heart Surgery;  Laterality: N/A;  ? COLONOSCOPY WITH PROPOFOL N/A 09/14/2021  ? Procedure: COLONOSCOPY WITH PROPOFOL;  Surgeon: Lin Landsman, MD;  Location: [facility] ENDOSCOPY;  Service: Gastroenterology;  Laterality: N/A;  ? CORONARY ARTERY BYPASS GRAFT N/A 08/06/2020  ? Procedure: CORONARY ARTERY BYPASS GRAFTING (CABG) X2, USING BILATERAL INTERNAL MAMMARY ARTERIES;  Surgeon: Wonda Olds, MD;  Location: [facility] OR;  Service: Open Heart Surgery;  Laterality: N/A;  ? ICD IMPLANT N/A 12/05/2020  ? Procedure: ICD IMPLANT;  Surgeon: Vickie Epley, MD;  Location: [place] CV LAB;  Service: Cardiovascular;  Laterality: N/A;  ? KNEE SURGERY    ? RIGHT/LEFT HEART CATH AND CORONARY ANGIOGRAPHY Bilateral 08/02/2020  ? Procedure: RIGHT/LEFT HEART CATH AND  CORONARY ANGIOGRAPHY;  Surgeon: [name], MD;  Location: [place] CV LAB;  Service: Cardiovascular;  Laterality: Bilateral;  ? SVT ABLATION N/A 12/05/2020  ? Procedure: SVT ABLATION;  Surgeon: Vickie Epley, MD;  Location: [place] CV LAB;  Service: Cardiovascular;  Laterality: N/A;  ? TEE WITHOUT CARDIOVERSION N/A 07/18/2020  ? Procedure: TRANSESOPHAGEAL ECHOCARDIOGRAM (TEE);  Surgeon: [name], MD;  Location: [facility] ORS;  Service: Cardiovascular;  Laterality: N/A;  ? TEE WITHOUT CARDIOVERSION N/A 08/06/2020  ? Procedure: TRANSESOPHAGEAL ECHOCARDIOGRAM (TEE);  Surgeon: Wonda Olds, MD;  Location: [place];  Service: Open Heart Surgery;  Laterality: N/A;  ? ? ?Current Medications: ?No outpatient medications have been marked as taking for the 12/19/21 encounter (Appointment) with Vickie Epley, MD.  ?  ? ?Allergies:   Peanut butter flavor and Lisinopril  ? ?Social History  ? ?Socioeconomic History  ? Marital status: Divorced  ?  Spouse name: Not on file  ? Number of children: Not on file  ? Years of education: Not on file  ? Highest education level: Not on file  ?Occupational History  ? Occupation: on short term disability  ?Tobacco Use  ? Smoking status: Former  ? Smokeless tobacco: Never  ? Tobacco comments:  ?  Quit 30 years ago  ?Vaping Use  ? Vaping Use: Never used  ?Substance and Sexual Activity  ? Alcohol use: Yes  ?  Comment: social  ? Drug use: No  ?  Sexual activity: Not on file  ?Other Topics Concern  ? Not on file  ?Social History Narrative  ? Lives with boyfriend  ? Right Handed  ? Drinks no caffeine daily  ? ?Social Determinants of Health  ? ?Financial Resource Strain: High Risk  ? Difficulty of Paying Living Expenses: Very hard  ?Food Insecurity: Food Insecurity Present  ? Worried About Charity fundraiser in the Last Year: Sometimes true  ? Ran Out of Food in the Last Year: Sometimes true  ?Transportation Needs: Unmet Transportation Needs  ? Lack of Transportation  (Medical): No  ? Lack of Transportation (Non-Medical): Yes  ?Physical Activity: Not on file  ?Stress: Not on file  ?Social Connections: Not on file  ?  ? ?Family History: ?The patient's family history includes Heart disease in her mother; Heart failure in her brother; Hypertension in her brother. ? ?ROS:   ?Please see the history of present illness.    ?All other systems reviewed and are negative. ? ?EKGs/Labs/Other Studies Reviewed:   ? ?The following studies were reviewed today: ? ? ? ?EKG:  The ekg ordered today demonstrates *** ? ?Recent Labs: ?12/13/2021: ALT 17; BUN 19; Creatinine, Ser 0.76; Hemoglobin 14.1; Platelets 280; Potassium 3.7; Sodium 138  ?Recent Lipid Panel ?   ?Component Value Date/Time  ? CHOL 113 12/11/2020 1133  ? TRIG 60 12/11/2020 1133  ? HDL 41 12/11/2020 1133  ? CHOLHDL 2.8 12/11/2020 1133  ? CHOLHDL 3.3 08/03/2020 0042  ? VLDL 12 08/03/2020 0042  ? LDLCALC 59 12/11/2020 1133  ? ? ?Physical Exam:   ? ?VS:  LMP 11/21/2020    ? ?Wt Readings from Last 3 Encounters:  ?12/13/21 197 lb (89.4 kg)  ?10/02/21 196 lb (88.9 kg)  ?09/14/21 197 lb (89.4 kg)  ?  ? ?GEN: *** Well nourished, well developed in no acute distress ?HEENT: Normal ?NECK: No JVD; No carotid bruits ?LYMPHATICS: No lymphadenopathy ?CARDIAC: ***RRR, no murmurs, rubs, gallops ?RESPIRATORY:  Clear to auscultation without rales, wheezing or rhonchi  ?ABDOMEN: Soft, non-tender, non-distended ?MUSCULOSKELETAL:  No edema; No deformity  ?SKIN: Warm and dry ?NEUROLOGIC:  Alert and oriented x 3 ?PSYCHIATRIC:  Normal affect  ? ? ? ?  ? ?ASSESSMENT:   ? ?No diagnosis found. ?PLAN:   ? ?In order of problems listed above: ? ? ? ? ? ? ? ? ? ? ?Total time spent with patient today *** minutes. This includes reviewing records, evaluating the patient and coordinating care.  ? ?Medication Adjustments/Labs and Tests Ordered: ?Current medicines are reviewed at length with the patient today.  Concerns regarding medicines are outlined above.  ?No orders of  the defined types were placed in this encounter. ? ?No orders of the defined types were placed in this encounter. ? ? ? ?Signed, ?Lars Mage, MD, [facility], [facility] ?12/18/2021 10:44 PM    ?Electrophysiology ?[place] ?

## 2021-12-18 NOTE — Progress Notes (Signed)
Remote ICD transmission.   

## 2021-12-19 ENCOUNTER — Ambulatory Visit (INDEPENDENT_AMBULATORY_CARE_PROVIDER_SITE_OTHER): Payer: Medicaid Other | Admitting: Cardiology

## 2021-12-19 ENCOUNTER — Encounter: Payer: Self-pay | Admitting: Cardiology

## 2021-12-19 VITALS — BP 102/52 | HR 86 | Ht 66.0 in | Wt 204.6 lb

## 2021-12-19 DIAGNOSIS — I5020 Unspecified systolic (congestive) heart failure: Secondary | ICD-10-CM

## 2021-12-19 DIAGNOSIS — Z0271 Encounter for disability determination: Secondary | ICD-10-CM

## 2021-12-19 DIAGNOSIS — Z95810 Presence of automatic (implantable) cardiac defibrillator: Secondary | ICD-10-CM | POA: Diagnosis not present

## 2021-12-19 NOTE — Progress Notes (Signed)
?Electrophysiology Office Follow up Visit Note:   ? ?Date:  12/19/2021  ? ?ID:  Stacey Richardson, DOB 1966-05-31, MRN [id number] ? ?PCP:  Theotis Burrow, MD  ?[facility] Cardiologist:  [name], MD  ?[facility] Electrophysiologist:  Vickie Epley, MD  ? ? ?Interval History:   ? ?Stacey Richardson is a 56 y.o. female who presents for a follow up visit.  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  She also has an ICD in situ for chronic systolic heart failure.  She presents for follow-up. ? ?Today, she is feeling pretty good overall. She has not felt any recurring arrhythmic episodes. ? ?Lately she has noticed that she is gaining weight. She also reports that she is drinking a lot of water. ? ?She denies any palpitations, chest pain, shortness of breath, or peripheral edema. No lightheadedness, headaches, syncope, orthopnea, or PND. ? ? ? ?  ? ?Past Medical History:  ?Diagnosis Date  ? CAD (coronary artery disease)   ? CABG (07/2020; LIMA-LAD, RIMA-OM1)  ? CVA (cerebral vascular accident) [facility])   ? Diabetes mellitus   ? HFrEF (heart failure with reduced ejection fraction) ([place])   ? HLD (hyperlipidemia)   ? Hypertension   ? SVT (supraventricular tachycardia) ([place])   ? s/p ablation (11/2020)  ? ? ?Past Surgical History:  ?Procedure Laterality Date  ? CARDIAC CATHETERIZATION    ? CLIPPING OF ATRIAL APPENDAGE N/A 08/06/2020  ? Procedure: CLIPPING OF ATRIAL APPENDAGE USING ATRICURE CLIP SIZE 40MM;  Surgeon: Wonda Olds, MD;  Location: [place];  Service: Open Heart Surgery;  Laterality: N/A;  ? COLONOSCOPY WITH PROPOFOL N/A 09/14/2021  ? Procedure: COLONOSCOPY WITH PROPOFOL;  Surgeon: Lin Landsman, MD;  Location: [facility] ENDOSCOPY;  Service: Gastroenterology;  Laterality: N/A;  ? CORONARY ARTERY BYPASS GRAFT N/A 08/06/2020  ? Procedure: CORONARY ARTERY BYPASS GRAFTING (CABG) X2, USING BILATERAL INTERNAL MAMMARY ARTERIES;  Surgeon: Wonda Olds, MD;   Location: [facility] OR;  Service: Open Heart Surgery;  Laterality: N/A;  ? ICD IMPLANT N/A 12/05/2020  ? Procedure: ICD IMPLANT;  Surgeon: Vickie Epley, MD;  Location: [place] CV LAB;  Service: Cardiovascular;  Laterality: N/A;  ? KNEE SURGERY    ? RIGHT/LEFT HEART CATH AND CORONARY ANGIOGRAPHY Bilateral 08/02/2020  ? Procedure: RIGHT/LEFT HEART CATH AND CORONARY ANGIOGRAPHY;  Surgeon: [name], MD;  Location: [place] CV LAB;  Service: Cardiovascular;  Laterality: Bilateral;  ? SVT ABLATION N/A 12/05/2020  ? Procedure: SVT ABLATION;  Surgeon: Vickie Epley, MD;  Location: [place] CV LAB;  Service: Cardiovascular;  Laterality: N/A;  ? TEE WITHOUT CARDIOVERSION N/A 07/18/2020  ? Procedure: TRANSESOPHAGEAL ECHOCARDIOGRAM (TEE);  Surgeon: [name], MD;  Location: [facility] ORS;  Service: Cardiovascular;  Laterality: N/A;  ? TEE WITHOUT CARDIOVERSION N/A 08/06/2020  ? Procedure: TRANSESOPHAGEAL ECHOCARDIOGRAM (TEE);  Surgeon: Wonda Olds, MD;  Location: [place];  Service: Open Heart Surgery;  Laterality: N/A;  ? ? ?Current Medications: ?Current Meds  ?Medication Sig  ? acetaminophen (TYLENOL) 500 MG tablet Take 500 mg by mouth every 6 (six) hours as needed for moderate pain or headache.  ? alum & mag hydroxide-simeth (MAALOX ADVANCED MAX ST) 400-400-40 MG/5ML suspension Take 15 mLs by mouth every 6 (six) hours as needed for indigestion.  ? aspirin EC 81 MG tablet Take 81 mg by mouth daily.   ? atorvastatin (LIPITOR) 80 MG tablet Take 80 mg by mouth at bedtime.  ?  carvedilol (COREG) 12.5 MG tablet TAKE 1 TABLET BY MOUTH 2 TIMES DAILY.  ? JARDIANCE 10 MG TABS tablet Take 10 mg by mouth daily.  ? sacubitril-valsartan (ENTRESTO) 24-26 MG Take 1 tablet by mouth 2 (two) times daily.  ? spironolactone (ALDACTONE) 25 MG tablet TAKE 1/2 TABLET BY MOUTH EVERY DAY  ? tetrahydrozoline 0.05 % ophthalmic solution Place 1-2 drops into both eyes daily as needed (irritated eyes.). visine  ?  ? ?Allergies:    Lisinopril  ? ?Social History  ? ?Socioeconomic History  ? Marital status: Divorced  ?  Spouse name: Not on file  ? Number of children: Not on file  ? Years of education: Not on file  ? Highest education level: Not on file  ?Occupational History  ? Occupation: on short term disability  ?Tobacco Use  ? Smoking status: Former  ? Smokeless tobacco: Never  ? Tobacco comments:  ?  Quit 30 years ago  ?Vaping Use  ? Vaping Use: Never used  ?Substance and Sexual Activity  ? Alcohol use: Yes  ?  Comment: social  ? Drug use: No  ? Sexual activity: Not on file  ?Other Topics Concern  ? Not on file  ?Social History Narrative  ? Lives with boyfriend  ? Right Handed  ? Drinks no caffeine daily  ? ?Social Determinants of Health  ? ?Financial Resource Strain: High Risk  ? Difficulty of Paying Living Expenses: Very hard  ?Food Insecurity: Food Insecurity Present  ? Worried About Charity fundraiser in the Last Year: Sometimes true  ? Ran Out of Food in the Last Year: Sometimes true  ?Transportation Needs: Unmet Transportation Needs  ? Lack of Transportation (Medical): No  ? Lack of Transportation (Non-Medical): Yes  ?Physical Activity: Not on file  ?Stress: Not on file  ?Social Connections: Not on file  ?  ? ?Family History: ?The patient's family history includes Heart disease in her mother; Heart failure in her brother; Hypertension in her brother. ? ?ROS:   ?Please see the history of present illness.    ?All other systems reviewed and are negative. ? ?EKGs/Labs/Other Studies Reviewed:   ? ?The following studies were reviewed today: ? ? ?December 19, 2021 in clinic device interrogation personally reviewed ?Lead parameters stable ?Turned on capture control to maximize battery longevity ?0% pacing burden ? ? ? ?October 01, 2021, Echo ? 1. Left ventricular ejection fraction, by estimation, is 45 to 50%. The  ?left ventricle has mildly decreased function. The left ventricle has no  ?regional wall motion abnormalities. Left ventricular  diastolic parameters  ?are indeterminate.  ? 2. Right ventricular systolic function is normal. The right ventricular  ?size is normal. RV pacer lead noted.  ? 3. The mitral valve is normal in structure. Mild mitral valve  ?regurgitation. No evidence of mitral stenosis.  ? 4. The aortic valve is normal in structure. Aortic valve regurgitation is  ?not visualized. No aortic stenosis is present.  ? 5. The inferior vena cava is normal in size with greater than 50%  ?respiratory variability, suggesting right atrial pressure of 3 mmHg.  ? ?Comparison(s): EF 30-35%.  ? ?Jan 10, 2021 device interrogation in clinic personally reviewed ?0% pacing ?Lead parameters are stable ?Histograms are appropriate ?No tacky episodes ? ?EKG:  EKG is personally reviewed. ?12/19/2021: EKG was not ordered. ?01/10/2021: Sinus rhythm ? ?Recent Labs: ?12/13/2021: ALT 17; BUN 19; Creatinine, Ser 0.76; Hemoglobin 14.1; Platelets 280; Potassium 3.7; Sodium 138  ? ?Recent Lipid Panel ?   ?  Component Value Date/Time  ? CHOL 113 12/11/2020 1133  ? TRIG 60 12/11/2020 1133  ? HDL 41 12/11/2020 1133  ? CHOLHDL 2.8 12/11/2020 1133  ? CHOLHDL 3.3 08/03/2020 0042  ? VLDL 12 08/03/2020 0042  ? LDLCALC 59 12/11/2020 1133  ? ? ?Physical Exam:   ? ?VS:  BP (!) 102/52   Pulse 86   Ht '5\' 6"'$  (1.676 m)   Wt 204 lb 9.6 oz (92.8 kg)   LMP 11/21/2020   SpO2 98%   BMI 33.02 kg/m?    ? ?Wt Readings from Last 3 Encounters:  ?12/19/21 204 lb 9.6 oz (92.8 kg)  ?12/13/21 197 lb (89.4 kg)  ?10/02/21 196 lb (88.9 kg)  ?  ? ?GEN: Well nourished, well developed in no acute distress ?HEENT: Normal ?NECK: No JVD; No carotid bruits ?LYMPHATICS: No lymphadenopathy ?CARDIAC: RRR, no murmurs, rubs, gallops; Device pocket well healed. ?RESPIRATORY:  Clear to auscultation without rales, wheezing or rhonchi  ?ABDOMEN: Soft, non-tender, non-distended ?MUSCULOSKELETAL:  No edema; No deformity  ?SKIN: Warm and dry ?NEUROLOGIC:  Alert and oriented x 3 ?PSYCHIATRIC:  Normal affect  ? ? ? ?   ? ?ASSESSMENT:   ? ?1. HFrEF (heart failure with reduced ejection fraction) ([place])   ?2. ICD (implantable cardioverter-defibrillator) in place   ? ?PLAN:   ? ?In order of problems listed above: ? ?#Chronic systolic hear

## 2021-12-19 NOTE — Patient Instructions (Signed)

## 2021-12-29 ENCOUNTER — Other Ambulatory Visit: Payer: Self-pay | Admitting: Internal Medicine

## 2021-12-29 DIAGNOSIS — I10 Essential (primary) hypertension: Secondary | ICD-10-CM

## 2021-12-29 DIAGNOSIS — I5022 Chronic systolic (congestive) heart failure: Secondary | ICD-10-CM

## 2021-12-31 ENCOUNTER — Ambulatory Visit: Payer: Medicaid Other | Admitting: Medical

## 2021-12-31 NOTE — Progress Notes (Deleted)
Cardiology Office Note:    Date:  12/31/2021   ID:  Stacey Richardson, DOB 10/11/1965, MRN [id number]  PCP:  Theotis Burrow, MD  [facility] Cardiologist:  [name], MD  [facility] Electrophysiologist:  Vickie Epley, MD   Referring MD: Theotis Burrow*   Chief Complaint: 3 month follow-up  History of Present Illness:    Stacey Richardson is a 56 y.o. female with a hx of with a hx of  stroke, HFrEF due to ICM, CAD s/p CABG (LIMA-LAD, RIMA-OM1), HTN, HLD, DM2, obesity, SVT s/p ablation with slow pathway modification and ICD implantation on March 29,2022.    Hospitalized 06/12/20 with acute speech difficulty and dysphagia. Diagnosed with left frontal lobe infarct. Echo showed reduced LVEF 35-40%. Started on coreg, losartan, aspirin, plavix, rosuvastatin.    At follow-up 06/19/20 noted DOE and Zio showed afib/flutter, brief episodes of Wenckebach. Coreg was discontinued.    Ed visit 07/11/20 with MRI with small subacute appearing white matter infarct in left corona radiata new compared to previous, in same vicinity as prior ischemia as well as tiny area of cortical encephalomalacia corresponding to left operculum infarct last month. Reviewed by neurology with notation of subacute stroke recommended for outpatient follow up and to continue aspirin.    Underwent TEE 07/18/20 for evaluation of possible PFO with no evidence of thrombus, EF 30-35%, negative bubble study, though notation of question small tunnel in interatrial septum. She was recommended for [facility] to rule out ischemia as etiology of heart failure which was performed 08/02/20 showing severe ostial LMCA stenosis and mild nonobstructive coronary artery disease of LAD and LCx. She was transferred to [facility] for CABG which was performed 08/06/20 with LIMA-LAD, RIMA-OM1 and left atrial appendage clipping. For further evaluation of possible PFO transcranial doppler with bubbles was recommended and positive. On  review by Dr. Saunders Revel and Dr. Burt Knack, no indication for PFO closure as no clear evidence that PFO exists. At follow up with Dr. Saunders Revel 10/11/20 low dose Losartan as well as Coreg were initiated for optimization of heart failure therapies. She was seen by Dr. Quentin Ore 10/18/20 and loop recorder place and discussion initiated regarding ICD for HFrEF as well as potential EP study due to history of SVT. She had an updated echo 11/08/20 LVEF 30-35%, LV global longitudinal strain -12.2%, mild MR.   Seen 04/20/21 and reported occasional lightheadedness, orthostatics negative. Losartan was switched to [facility].    Seen in the ED 07/18/21 for near syncope. Device interrogation was unremarkable. Follow-up echo was ordered, which showed improvement of EF 45-50%.  Seen by EP 12/19/21 and was overall doing well, reported weight gain. Planned 1 year follow-up.  Today,   Past Medical History:  Diagnosis Date   CAD (coronary artery disease)    CABG (07/2020; LIMA-LAD, RIMA-OM1)   CVA (cerebral vascular accident) ([place])    Diabetes mellitus    HFrEF (heart failure with reduced ejection fraction) ([place])    HLD (hyperlipidemia)    Hypertension    SVT (supraventricular tachycardia) ([place])    s/p ablation (11/2020)    Past Surgical History:  Procedure Laterality Date   CARDIAC CATHETERIZATION     CLIPPING OF ATRIAL APPENDAGE N/A 08/06/2020   Procedure: CLIPPING OF ATRIAL APPENDAGE USING ATRICURE CLIP SIZE 40MM;  Surgeon: Wonda Olds, MD;  Location: [place];  Service: Open Heart Surgery;  Laterality: N/A;   COLONOSCOPY WITH PROPOFOL N/A 09/14/2021   Procedure: COLONOSCOPY WITH PROPOFOL;  Surgeon: Lin Landsman,  MD;  Location: [facility] ENDOSCOPY;  Service: Gastroenterology;  Laterality: N/A;   CORONARY ARTERY BYPASS GRAFT N/A 08/06/2020   Procedure: CORONARY ARTERY BYPASS GRAFTING (CABG) X2, USING BILATERAL INTERNAL MAMMARY ARTERIES;  Surgeon: Wonda Olds, MD;  Location: [place];  Service: Open Heart Surgery;   Laterality: N/A;   ICD IMPLANT N/A 12/05/2020   Procedure: ICD IMPLANT;  Surgeon: Vickie Epley, MD;  Location: [place] CV LAB;  Service: Cardiovascular;  Laterality: N/A;   KNEE SURGERY     RIGHT/LEFT HEART CATH AND CORONARY ANGIOGRAPHY Bilateral 08/02/2020   Procedure: RIGHT/LEFT HEART CATH AND CORONARY ANGIOGRAPHY;  Surgeon: [name], MD;  Location: [place] CV LAB;  Service: Cardiovascular;  Laterality: Bilateral;   SVT ABLATION N/A 12/05/2020   Procedure: SVT ABLATION;  Surgeon: Vickie Epley, MD;  Location: [place] CV LAB;  Service: Cardiovascular;  Laterality: N/A;   TEE WITHOUT CARDIOVERSION N/A 07/18/2020   Procedure: TRANSESOPHAGEAL ECHOCARDIOGRAM (TEE);  Surgeon: [name], MD;  Location: [facility] ORS;  Service: Cardiovascular;  Laterality: N/A;   TEE WITHOUT CARDIOVERSION N/A 08/06/2020   Procedure: TRANSESOPHAGEAL ECHOCARDIOGRAM (TEE);  Surgeon: Wonda Olds, MD;  Location: [place];  Service: Open Heart Surgery;  Laterality: N/A;    Current Medications: No outpatient medications have been marked as taking for the 12/31/21 encounter (Appointment) with Kathlen Mody, Laelah Siravo H, PA-C.     Allergies:   Lisinopril   Social History   Socioeconomic History   Marital status: Divorced    Spouse name: Not on file   Number of children: Not on file   Years of education: Not on file   Highest education level: Not on file  Occupational History   Occupation: on short term disability  Tobacco Use   Smoking status: Former   Smokeless tobacco: Never   Tobacco comments:    Quit 30 years ago  Vaping Use   Vaping Use: Never used  Substance and Sexual Activity   Alcohol use: Yes    Comment: social   Drug use: No   Sexual activity: Not on file  Other Topics Concern   Not on file  Social History Narrative   Lives with boyfriend   Right Handed   Drinks no caffeine daily   Social Determinants of Health   Financial Resource Strain: High Risk   Difficulty  of Paying Living Expenses: Very hard  Food Insecurity: Landscape architect Present   Worried About Charity fundraiser in the Last Year: Sometimes true   Arboriculturist in the Last Year: Sometimes true  Transportation Needs: Unmet Transportation Needs   Lack of Transportation (Medical): No   Lack of Transportation (Non-Medical): Yes  Physical Activity: Not on file  Stress: Not on file  Social Connections: Not on file     Family History: The patient's family history includes Heart disease in her mother; Heart failure in her brother; Hypertension in her brother.  ROS:   Please see the history of present illness.     All other systems reviewed and are negative.  EKGs/Labs/Other Studies Reviewed:    The following studies were reviewed today:  Echo 10/01/21 1. Left ventricular ejection fraction, by estimation, is 45 to 50%. The  left ventricle has mildly decreased function. The left ventricle has no  regional wall motion abnormalities. Left ventricular diastolic parameters  are indeterminate.   2. Right ventricular systolic function is normal. The right ventricular  size is normal. RV pacer lead noted.   3. The  mitral valve is normal in structure. Mild mitral valve  regurgitation. No evidence of mitral stenosis.   4. The aortic valve is normal in structure. Aortic valve regurgitation is  not visualized. No aortic stenosis is present.   5. The inferior vena cava is normal in size with greater than 50%  respiratory variability, suggesting right atrial pressure of 3 mmHg.     Limited Echo 11/2020  1. Left ventricular ejection fraction, by estimation, is 30 to 35%. The  left ventricle has moderate to severely decreased function. The average  left ventricular global longitudinal strain is -12.1 %. The global  longitudinal strain is abnormal.   2. The mitral valve is normal in structure. Mild mitral valve  regurgitation.   3. The aortic valve was not well visualized. Aortic valve  regurgitation  is not visualized.    Cardiac cath 2021 Conclusions: Severe ostial LMCA stenosis (~80%) with pressure dampening of 16F diagnostic catheter.  There is no significant improvement with intracoronary nitroglycerin. Mild, non-obstructive coronary artery disease involving LAD and dominant LCx. Normal left and right heart filling pressures. Normal Fick cardiac output/index. Right radial artery loop.   Recommendations: Transfer to [facility] for cardiac surgery consultation.  If the patient is not a surgical candidate, her LMCA stenosis is treatable percutaneously, though this would require hemodynamic support given reduced LVEF and left-dominant system. Hold clopidogrel pending cardiac surgery evaluation. Avoid right radial artery access for future catheterization. Aggressive secondary prevention of coronary artery disease.   [name], MD [facility]   Echo 06/2020  1. Left ventricular ejection fraction, by estimation, is 35 to 40%. The  left ventricle has moderately decreased function. Left ventricular  endocardial border not optimally defined to evaluate regional wall motion.  The left ventricular internal cavity  size was mildly dilated. There is mild left ventricular hypertrophy. Left  ventricular diastolic function could not be evaluated.   2. Right ventricular systolic function is normal. The right ventricular  size is normal.   3. The mitral valve is normal in structure. Mild to moderate mitral valve  regurgitation.   4. The aortic valve has an indeterminant number of cusps. Aortic valve  regurgitation not well assessed.   5. The inferior vena cava is normal in size with greater than 50%  respiratory variability, suggesting right atrial pressure of 3 mmHg.       EKG:  EKG is *** ordered today.  The ekg ordered today demonstrates ***  Recent Labs: 12/13/2021: ALT 17; BUN 19; Creatinine, Ser 0.76; Hemoglobin 14.1; Platelets 280; Potassium 3.7; Sodium 138   Recent Lipid Panel    Component Value Date/Time   CHOL 113 12/11/2020 1133   TRIG 60 12/11/2020 1133   HDL 41 12/11/2020 1133   CHOLHDL 2.8 12/11/2020 1133   CHOLHDL 3.3 08/03/2020 0042   VLDL 12 08/03/2020 0042   LDLCALC 59 12/11/2020 1133     Risk Assessment/Calculations:   {Does this patient have ATRIAL FIBRILLATION?:[phone]}   Physical Exam:    VS:  LMP 11/21/2020     Wt Readings from Last 3 Encounters:  12/19/21 204 lb 9.6 oz (92.8 kg)  12/13/21 197 lb (89.4 kg)  10/02/21 196 lb (88.9 kg)     GEN: *** Well nourished, well developed in no acute distress HEENT: Normal NECK: No JVD; No carotid bruits LYMPHATICS: No lymphadenopathy CARDIAC: ***RRR, no murmurs, rubs, gallops RESPIRATORY:  Clear to auscultation without rales, wheezing or rhonchi  ABDOMEN: Soft, non-tender, non-distended MUSCULOSKELETAL:  No edema; No  deformity  SKIN: Warm and dry NEUROLOGIC:  Alert and oriented x 3 PSYCHIATRIC:  Normal affect   ASSESSMENT:    No diagnosis found. PLAN:    In order of problems listed above:  HFrEF ICM s/p ICD  CAD s/p CABG  Cryptogenic stroke  HTN  HLD  Disposition: Follow up {follow up:15908} with ***   Shared Decision Making/Informed Consent   {Are you ordering a CV Procedure (e.g. stress test, cath, DCCV, TEE, etc)?   Press F2        :[id number]}    Signed, Naitik Hermann Arlyss Repress  12/31/2021 8:55 AM    [facility]

## 2022-01-04 ENCOUNTER — Ambulatory Visit: Payer: Medicaid Other | Admitting: Internal Medicine

## 2022-01-08 ENCOUNTER — Encounter: Payer: Self-pay | Admitting: Medical

## 2022-01-08 ENCOUNTER — Ambulatory Visit: Payer: Medicaid Other | Admitting: Medical

## 2022-01-08 VITALS — BP 104/60 | HR 76 | Ht 66.0 in | Wt 199.0 lb

## 2022-01-08 DIAGNOSIS — E785 Hyperlipidemia, unspecified: Secondary | ICD-10-CM

## 2022-01-08 DIAGNOSIS — R079 Chest pain, unspecified: Secondary | ICD-10-CM | POA: Diagnosis not present

## 2022-01-08 DIAGNOSIS — Z95810 Presence of automatic (implantable) cardiac defibrillator: Secondary | ICD-10-CM

## 2022-01-08 DIAGNOSIS — I2510 Atherosclerotic heart disease of native coronary artery without angina pectoris: Secondary | ICD-10-CM | POA: Diagnosis not present

## 2022-01-08 DIAGNOSIS — Z951 Presence of aortocoronary bypass graft: Secondary | ICD-10-CM | POA: Diagnosis not present

## 2022-01-08 DIAGNOSIS — R55 Syncope and collapse: Secondary | ICD-10-CM

## 2022-01-08 DIAGNOSIS — I429 Cardiomyopathy, unspecified: Secondary | ICD-10-CM | POA: Diagnosis not present

## 2022-01-08 DIAGNOSIS — I639 Cerebral infarction, unspecified: Secondary | ICD-10-CM

## 2022-01-08 DIAGNOSIS — E1169 Type 2 diabetes mellitus with other specified complication: Secondary | ICD-10-CM

## 2022-01-08 DIAGNOSIS — I10 Essential (primary) hypertension: Secondary | ICD-10-CM

## 2022-01-08 DIAGNOSIS — I5020 Unspecified systolic (congestive) heart failure: Secondary | ICD-10-CM

## 2022-01-08 MED ORDER — CARVEDILOL 6.25 MG PO TABS
6.2500 mg | ORAL_TABLET | Freq: Two times a day (BID) | ORAL | 3 refills | Status: DC
Start: 2022-01-08 — End: 2022-04-15

## 2022-01-08 MED ORDER — NITROGLYCERIN 0.4 MG SL SUBL: 0.4000 mg | SUBLINGUAL_TABLET | SUBLINGUAL | 3 refills | Status: DC | PRN

## 2022-01-08 NOTE — Progress Notes (Signed)
?Cardiology Office Note:   ? ?Date:  01/08/2022  ? ?ID:  Stacey Richardson, DOB 19-Sep-1965, MRN [id number] ? ?PCP:  Theotis Burrow, MD  ?[facility] Cardiologist:  [name], MD  ?[facility] Electrophysiologist:  Vickie Epley, MD  ? ?Referring MD: Theotis Burrow*  ? ?Chief Complaint: 3 month follow-up ? ?History of Present Illness:   ? ?Stacey Richardson is a 56 y.o. female with a hx of  stroke, HFrEF due to ICM, CAD s/p CABG (LIMA-LAD, RIMA-OM1), HTN, HLD, DM2, obesity, SVT s/p ablation with slow pathway modification and ICD implantation on March 29,2022.  ?  ?Hospitalized 06/12/20 with acute speech difficulty and dysphagia. Diagnosed with left frontal lobe infarct. Echo showed reduced LVEF 35-40%. Started on coreg, losartan, aspirin, plavix, rosuvastatin.  ?  ?At follow-up 06/19/20 noted DOE and Zio showed afib/flutter, brief episodes of Wenckebach. Coreg was discontinued.  ?  ?Ed visit 07/11/20 with MRI with small subacute appearing white matter infarct in left corona radiata new compared to previous, in same vicinity as prior ischemia as well as tiny area of cortical encephalomalacia corresponding to left operculum infarct last month. Reviewed by neurology with notation of subacute stroke recommended for outpatient follow up and to continue aspirin.  ?  ?Underwent TEE 07/18/20 for evaluation of possible PFO with no evidence of thrombus, EF 30-35%, negative bubble study, though notation of question small tunnel in interatrial septum. She was recommended for [facility] to rule out ischemia as etiology of heart failure which was performed 08/02/20 showing severe ostial LMCA stenosis and mild nonobstructive coronary artery disease of LAD and LCx. She was transferred to [facility] for CABG which was performed 08/06/20 with LIMA-LAD, RIMA-OM1 and left atrial appendage clipping. For further evaluation of possible PFO transcranial doppler with bubbles was recommended and positive. On review by Dr.  Saunders Revel and Dr. Burt Knack, no indication for PFO closure as no clear evidence that PFO exists. At follow up with Dr. Saunders Revel 10/11/20 low dose Losartan as well as Coreg were initiated for optimization of heart failure therapies. She was seen by Dr. Quentin Ore 10/18/20 and loop recorder place and discussion initiated regarding ICD for HFrEF as well as potential EP study due to history of SVT. She had an updated echo 11/08/20 LVEF 30-35%, LV global longitudinal strain -12.2%, mild MR. ?  ?Seen 01/10/21  by EP and was doing well, no shocks and no recurrent SVT.  ?  ?Seen 04/20/21 and reported occasional lightheadedness, orthostatics negative. Losartan was switched to [facility].  ?  ?Seen in the ED 07/18/21 for near syncope. ER work-up was unremarkable. Patient thought it was from hypotension, however BP was normal. Echo was repeated for near syncope. Echo showed LVEF 45-50%.  ? ?Today, the atient reports some chest pain. Let side, non-radiating. Occurs when she lays down. It occurred last month, and has not been consistent. She does not have NTG. No shortness of breath, LLE, orthopnea, pnd. She recently started taking MAALOX and is taking something for constipation. She has occasional lightheadednes/dizziness. BP soft and EKG With no new changes.  ? ? ?Past Medical History:  ?Diagnosis Date  ? CAD (coronary artery disease)   ? CABG (07/2020; LIMA-LAD, RIMA-OM1)  ? CVA (cerebral vascular accident) [facility])   ? Diabetes mellitus   ? HFrEF (heart failure with reduced ejection fraction) ([place])   ? HLD (hyperlipidemia)   ? Hypertension   ? SVT (supraventricular tachycardia) ([place])   ? s/p ablation (11/2020)  ? ? ?Past Surgical  History:  ?Procedure Laterality Date  ? CARDIAC CATHETERIZATION    ? CLIPPING OF ATRIAL APPENDAGE N/A 08/06/2020  ? Procedure: CLIPPING OF ATRIAL APPENDAGE USING ATRICURE CLIP SIZE 40MM;  Surgeon: Wonda Olds, MD;  Location: [place];  Service: Open Heart Surgery;  Laterality: N/A;  ? COLONOSCOPY WITH PROPOFOL N/A 09/14/2021  ?  Procedure: COLONOSCOPY WITH PROPOFOL;  Surgeon: Lin Landsman, MD;  Location: [facility] ENDOSCOPY;  Service: Gastroenterology;  Laterality: N/A;  ? CORONARY ARTERY BYPASS GRAFT N/A 08/06/2020  ? Procedure: CORONARY ARTERY BYPASS GRAFTING (CABG) X2, USING BILATERAL INTERNAL MAMMARY ARTERIES;  Surgeon: Wonda Olds, MD;  [facility] OR;  Service: Open Heart Surgery;  Laterality: N/A;  ? ICD IMPLANT N/A 12/05/2020  ? Procedure: ICD IMPLANT;  Surgeon: Vickie Epley, MD;  Location: [place] CV LAB;  Service: Cardiovascular;  Laterality: N/A;  ? KNEE SURGERY    ? RIGHT/LEFT HEART CATH AND CORONARY ANGIOGRAPHY Bilateral 08/02/2020  ? Procedure: RIGHT/LEFT HEART CATH AND CORONARY ANGIOGRAPHY;  Surgeon: [name], MD;  Location: [place] CV LAB;  Service: Cardiovascular;  Laterality: Bilateral;  ? SVT ABLATION N/A 12/05/2020  ? Procedure: SVT ABLATION;  Surgeon: Vickie Epley, MD;  Location: [place] CV LAB;  Service: Cardiovascular;  Laterality: N/A;  ? TEE WITHOUT CARDIOVERSION N/A 07/18/2020  ? Procedure: TRANSESOPHAGEAL ECHOCARDIOGRAM (TEE);  Surgeon: [name], MD;  Location: [facility] ORS;  Service: Cardiovascular;  Laterality: N/A;  ? TEE WITHOUT CARDIOVERSION N/A 08/06/2020  ? Procedure: TRANSESOPHAGEAL ECHOCARDIOGRAM (TEE);  Surgeon: Wonda Olds, MD;  Location: [place];  Service: Open Heart Surgery;  Laterality: N/A;  ? ? ?Current Medications: ?Current Meds  ?Medication Sig  ? acetaminophen (TYLENOL) 500 MG tablet Take 500 mg by mouth every 6 (six) hours as needed for moderate pain or headache.  ? alum & mag hydroxide-simeth (MAALOX ADVANCED MAX ST) 400-400-40 MG/5ML suspension Take 15 mLs by mouth every 6 (six) hours as needed for indigestion.  ? aspirin EC 81 MG tablet Take 81 mg by mouth daily.   ? atorvastatin (LIPITOR) 80 MG tablet Take 80 mg by mouth at bedtime.  ? JARDIANCE 10 MG TABS tablet Take 10 mg by mouth daily.  ? nitroGLYCERIN (NITROSTAT) 0.4 MG SL tablet Place 1  tablet (0.4 mg total) under the tongue every 5 (five) minutes as needed for chest pain.  ? sacubitril-valsartan (ENTRESTO) 24-26 MG Take 1 tablet by mouth 2 (two) times daily.  ? spironolactone (ALDACTONE) 25 MG tablet TAKE 1/2 TABLET BY MOUTH EVERY DAY  ? tetrahydrozoline 0.05 % ophthalmic solution Place 1-2 drops into both eyes daily as needed (irritated eyes.). visine  ? [DISCONTINUED] carvedilol (COREG) 12.5 MG tablet TAKE 1 TABLET BY MOUTH TWICE A DAY  ?  ? ?Allergies:   Lisinopril  ? ?Social History  ? ?Socioeconomic History  ? Marital status: Divorced  ?  Spouse name: Not on file  ? Number of children: Not on file  ? Years of education: Not on file  ? Highest education level: Not on file  ?Occupational History  ? Occupation: on short term disability  ?Tobacco Use  ? Smoking status: Former  ? Smokeless tobacco: Never  ? Tobacco comments:  ?  Quit 30 years ago  ?Vaping Use  ? Vaping Use: Never used  ?Substance and Sexual Activity  ? Alcohol use: Yes  ?  Comment: social  ? Drug use: No  ? Sexual activity: Not on file  ?Other Topics Concern  ? Not on  file  ?Social History Narrative  ? Lives with boyfriend  ? Right Handed  ? Drinks no caffeine daily  ? ?Social Determinants of Health  ? ?Financial Resource Strain: High Risk  ? Difficulty of Paying Living Expenses: Very hard  ?Food Insecurity: Food Insecurity Present  ? Worried About Charity fundraiser in the Last Year: Sometimes true  ? Ran Out of Food in the Last Year: Sometimes true  ?Transportation Needs: Unmet Transportation Needs  ? Lack of Transportation (Medical): No  ? Lack of Transportation (Non-Medical): Yes  ?Physical Activity: Not on file  ?Stress: Not on file  ?Social Connections: Not on file  ?  ? ?Family History: ?The patient's family history includes Heart disease in her mother; Heart failure in her brother; Hypertension in her brother. ? ?ROS:   ?Please see the history of present illness.    ? All other systems reviewed and are  negative. ? ?EKGs/Labs/Other Studies Reviewed:   ? ?The following studies were reviewed today: ? ?Echo limited 09/2021 ? 1. Left ventricular ejection fraction, by estimation, is 45 to 50%. The  ?left ventricle has mildly dec

## 2022-01-08 NOTE — Patient Instructions (Signed)
Medication Instructions:  ? ?Your physician has recommended you make the following change in your medication:  ? ?DECREASE Carvedilol (Coreg) 6.25 mg TWICE daily  ? ?TAKE Nitroglycerin 0.4 mg sublingual AS NEEDED for chest pain (see instructions below): ? ?For as needed Nitroglycerin, if you develop chest pain: ?Sit and rest 5 minutes. If chest pain does not resolve place 1 nitroglycerin under your tongue and wait 5 minutes. ?If chest pain does not resolve, place a 2nd nitroglycerin under your tongue and wait 5 more minutes. ?If chest pain does not resolve, place a 3rd nitroglycerin under your tongue and seek emergency services.  ? ? ?*If you need a refill on your cardiac medications before your next appointment, please call your pharmacy* ? ? ?Lab Work: ? ?None ordered ? ?Testing/Procedures: ? ?[facility] MYOVIEW ? ?Your caregiver has ordered a Stress Test with nuclear imaging. The purpose of this test is to evaluate the blood supply to your heart muscle. This procedure is referred to as a "Non-Invasive Stress Test." This is because other than having an IV started in your vein, nothing is inserted or "invades" your body. Cardiac stress tests are done to find areas of poor blood flow to the heart by determining the extent of coronary artery disease (CAD). Some patients exercise on a treadmill, which naturally increases the blood flow to your heart, while others who are  unable to walk on a treadmill due to physical limitations have a pharmacologic/chemical stress agent called Lexiscan . This medicine will mimic walking on a treadmill by temporarily increasing your coronary blood flow.  ? ?Please note: these test may take anywhere between 2-4 hours to complete ? ?PLEASE REPORT TO [facility]  ?THE VOLUNTEERS AT THE FIRST DESK WILL DIRECT YOU WHERE TO GO ? ?Date of Procedure:_____________________________________ ? ?Arrival Time for Procedure:______________________________ ? ?Instructions regarding medication:   ? ?____ : Hold diabetes medication morning of procedure ? ?__X__:  Hold betablocker(s) night before procedure and morning of procedure (only for exercise or dobutamine studies ) Carvedilol (Coreg) ? ? ?PLEASE NOTIFY THE OFFICE AT LEAST 19 HOURS IN ADVANCE IF YOU ARE UNABLE TO KEEP YOUR APPOINTMENT.  [phone] ?AND  ?PLEASE NOTIFY NUCLEAR MEDICINE AT [facility] AT LEAST 11 HOURS IN ADVANCE IF YOU ARE UNABLE TO KEEP YOUR APPOINTMENT. [phone] ? ?How to prepare for your Myoview test: ? ?Do not eat or drink after midnight ?No caffeine for 24 hours prior to test ?No smoking 24 hours prior to test. ?Your medication may be taken with water.  If your doctor stopped a medication because of this test, do not take that medication. ?Ladies, please do not wear dresses.  Skirts or pants are appropriate. Please wear a short sleeve shirt. ?No perfume, cologne or lotion. ?Wear comfortable walking shoes. No heels! ? ? ?Follow-Up: ?At [facility], you and your health needs are our priority.  As part of our continuing mission to provide you with exceptional heart care, we have created designated Provider Care Teams.  These Care Teams include your primary Cardiologist (physician) and Advanced Practice Providers (APPs -  Physician Assistants and Nurse Practitioners) who all work together to provide you with the care you need, when you need it. ? ?We recommend signing up for the patient portal called "MyChart".  Sign up information is provided on this After Visit Summary.  MyChart is used to connect with patients for Virtual Visits (Telemedicine).  Patients are able to view lab/test results, encounter notes, upcoming appointments, etc.  Non-urgent messages  can be sent to your provider as well.   ?To learn more about what you can do with MyChart, go to [email or web address].   ? ?Your next appointment:   ?6 - 8 week(s) ? ?The format for your next appointment:   ?In Person ? ?Provider:   ?You may see [name], MD or one of the  following Advanced Practice Providers on your designated Care Team:   ?Murray Hodgkins, NP ?Christell Faith, PA-C ?Cadence Kathlen Mody, PA-C{ ? ?Important Information About Sugar ? ? ? ? ? ? ?

## 2022-01-14 ENCOUNTER — Ambulatory Visit: Payer: Medicaid Other | Attending: Family Medicine

## 2022-01-17 ENCOUNTER — Encounter
Admission: RE | Admit: 2022-01-17 | Discharge: 2022-01-17 | Disposition: A | Discharge status: Home / Self Care | Payer: Medicaid Other | Source: Ambulatory Visit | Attending: Medical | Admitting: Medical

## 2022-01-17 DIAGNOSIS — R079 Chest pain, unspecified: Secondary | ICD-10-CM | POA: Diagnosis not present

## 2022-01-17 LAB — NM MYOCAR MULTI W/SPECT W/WALL MOTION / EF
Angina Index: 0
Estimated workload: 7
Exercise duration (min): 6 min
Exercise duration (sec): 0 s
LV dias vol: 52 mL (ref 46–106)
LV sys vol: 17 mL
MPHR: 166 {beats}/min
Nuc Stress EF: 67 %
Peak HR: 176 {beats}/min
Percent HR: 106 %
Rest HR: 93 {beats}/min
Rest Nuclear Isotope Dose: 10.2 mCi
SDS: 1
SRS: 3
SSS: 1
Stress Nuclear Isotope Dose: 30.4 mCi
TID: 0.64

## 2022-01-17 MED ORDER — TECHNETIUM TC 99M TETROFOSMIN IV KIT: 10.1800 | PACK | Freq: Once | INTRAVENOUS | Status: DC | PRN

## 2022-01-17 MED ORDER — TECHNETIUM TC 99M TETROFOSMIN IV KIT
10.0000 | PACK | Freq: Once | INTRAVENOUS | Status: AC
  Administered 2022-01-17: 10.18 via INTRAVENOUS

## 2022-01-17 MED ORDER — TECHNETIUM TC 99M TETROFOSMIN IV KIT
30.4000 | PACK | Freq: Once | INTRAVENOUS | Status: AC | PRN
  Administered 2022-01-17: 30.4 via INTRAVENOUS

## 2022-01-17 MED ORDER — REGADENOSON 0.4 MG/5ML IV SOLN
0.4000 mg | Freq: Once | INTRAVENOUS | Status: AC
  Administered 2022-01-17: 0.4 mg via INTRAVENOUS

## 2022-01-26 ENCOUNTER — Other Ambulatory Visit: Payer: Self-pay | Admitting: Internal Medicine

## 2022-01-26 DIAGNOSIS — I10 Essential (primary) hypertension: Secondary | ICD-10-CM

## 2022-01-26 DIAGNOSIS — I5022 Chronic systolic (congestive) heart failure: Secondary | ICD-10-CM

## 2022-01-28 MED ORDER — SPIRONOLACTONE 25 MG PO TABS: 12.5000 mg | ORAL_TABLET | Freq: Every day | ORAL | 0 refills | Status: DC

## 2022-02-12 ENCOUNTER — Other Ambulatory Visit: Payer: Self-pay | Admitting: Internal Medicine

## 2022-02-21 ENCOUNTER — Ambulatory Visit: Payer: Medicaid Other | Admitting: Medical

## 2022-03-06 ENCOUNTER — Ambulatory Visit: Payer: 59

## 2022-03-06 LAB — CUP PACEART REMOTE DEVICE CHECK
Date Time Interrogation Session: 20230628074517
Implantable Lead Implant Date: 20220329
Implantable Lead Location: 753860
Implantable Lead Model: 436909
Implantable Lead Serial Number: 81437635
Implantable Pulse Generator Implant Date: 20220329
Pulse Gen Model: 429525
Pulse Gen Serial Number: 84824217

## 2022-03-18 ENCOUNTER — Encounter: Payer: Self-pay | Admitting: Cardiology

## 2022-03-26 NOTE — Progress Notes (Signed)
Remote ICD transmission.   

## 2022-03-27 ENCOUNTER — Ambulatory Visit: Payer: Medicaid Other | Admitting: Nurse Practitioner

## 2022-04-14 ENCOUNTER — Other Ambulatory Visit: Payer: Self-pay | Admitting: Medical

## 2022-04-14 ENCOUNTER — Other Ambulatory Visit: Payer: Self-pay | Admitting: Internal Medicine

## 2022-04-15 ENCOUNTER — Telehealth: Payer: Self-pay | Admitting: Internal Medicine

## 2022-04-15 MED ORDER — CARVEDILOL 6.25 MG PO TABS: 6.2500 mg | ORAL_TABLET | Freq: Two times a day (BID) | ORAL | 3 refills | Status: DC

## 2022-04-15 NOTE — Telephone Encounter (Signed)
*  STAT* If patient is at the pharmacy, call can be transferred to refill team.   1. Which medications need to be refilled? (please list name of each medication and dose if known)   carvedilol (COREG) 6.25 MG tablet    2. Which pharmacy/location (including street and city if local pharmacy) is medication to be sent to? [id number] [place], [place] - [address]. Do they need a 30 day or 90 day supply?  90 day

## 2022-04-17 ENCOUNTER — Encounter (INDEPENDENT_AMBULATORY_CARE_PROVIDER_SITE_OTHER): Payer: Self-pay

## 2022-04-24 ENCOUNTER — Telehealth: Payer: Self-pay | Admitting: Internal Medicine

## 2022-04-24 NOTE — Telephone Encounter (Signed)
Pt c/o medication issue:  1. Name of Medication: Petra Kuba Multi-vitamin   2. How are you currently taking this medication (dosage and times per day)?   3. Are you having a reaction (difficulty breathing--STAT)?   4. What is your medication issue? Pt would like to know if it would be okay for her start taking a multi-vitamin.

## 2022-04-24 NOTE — Telephone Encounter (Signed)
Called patient back and let her know it is fine to take the Nature multi-vitamin. Patient was grateful for the call back.

## 2022-04-30 ENCOUNTER — Other Ambulatory Visit: Payer: Self-pay | Admitting: Internal Medicine

## 2022-05-02 ENCOUNTER — Telehealth: Payer: Self-pay | Admitting: Internal Medicine

## 2022-05-02 MED ORDER — CARVEDILOL 6.25 MG PO TABS: 6.2500 mg | ORAL_TABLET | Freq: Two times a day (BID) | ORAL | 0 refills | Status: DC

## 2022-05-02 NOTE — Telephone Encounter (Signed)
Requested Prescriptions   Signed Prescriptions Disp Refills   carvedilol (COREG) 6.25 MG tablet 180 tablet 0    Sig: Take 1 tablet (6.25 mg total) by mouth 2 (two) times daily.    Authorizing Provider: END, CHRISTOPHER    Ordering User: Raelene Bott, Kaydynce Pat L

## 2022-05-02 NOTE — Telephone Encounter (Signed)
*  STAT* If patient is at the pharmacy, call can be transferred to refill team.   1. Which medications need to be refilled? (please list name of each medication and dose if known) carvedilol (COREG) 6.25 MG tablet  2. Which pharmacy/location (including street and city if local pharmacy) is medication to be sent to? [id number] [place], [place] - [address]. Do they need a 30 day or 90 day supply? 90   Pt is completely out

## 2022-05-06 ENCOUNTER — Other Ambulatory Visit: Payer: Self-pay | Admitting: Medical

## 2022-05-09 ENCOUNTER — Telehealth: Payer: Self-pay | Admitting: Internal Medicine

## 2022-05-09 MED ORDER — ENTRESTO 24-26 MG PO TABS
1.0000 | ORAL_TABLET | Freq: Two times a day (BID) | ORAL | 5 refills | Status: DC
Start: 2022-05-09 — End: 2023-05-20

## 2022-05-09 NOTE — Telephone Encounter (Signed)
*  STAT* If patient is at the pharmacy, call can be transferred to refill team.   1. Which medications need to be refilled? (please list name of each medication and dose if known) need a new prescription for Entresto  2. Which pharmacy/location (including street and city if local pharmacy) is medication to be sent to[facility]   [address] [place]  3. Do they need a 30 day or 90 day supply? 90 days and refill- patient have an appointment  on  05-10-22

## 2022-05-09 NOTE — Telephone Encounter (Signed)
[facility] [place] [facility]   Entresto 24/26 mg, #60, RF:5

## 2022-05-10 ENCOUNTER — Ambulatory Visit: Payer: Medicaid Other | Attending: Nurse Practitioner | Admitting: Cardiology

## 2022-05-10 ENCOUNTER — Encounter: Payer: Self-pay | Admitting: Cardiology

## 2022-05-10 VITALS — BP 110/62 | HR 78 | Ht 66.0 in | Wt 198.4 lb

## 2022-05-10 DIAGNOSIS — Z79899 Other long term (current) drug therapy: Secondary | ICD-10-CM

## 2022-05-10 DIAGNOSIS — I429 Cardiomyopathy, unspecified: Secondary | ICD-10-CM

## 2022-05-10 DIAGNOSIS — E785 Hyperlipidemia, unspecified: Secondary | ICD-10-CM

## 2022-05-10 DIAGNOSIS — E1169 Type 2 diabetes mellitus with other specified complication: Secondary | ICD-10-CM

## 2022-05-10 DIAGNOSIS — Z951 Presence of aortocoronary bypass graft: Secondary | ICD-10-CM

## 2022-05-10 DIAGNOSIS — I639 Cerebral infarction, unspecified: Secondary | ICD-10-CM

## 2022-05-10 DIAGNOSIS — I255 Ischemic cardiomyopathy: Secondary | ICD-10-CM

## 2022-05-10 DIAGNOSIS — I2510 Atherosclerotic heart disease of native coronary artery without angina pectoris: Secondary | ICD-10-CM

## 2022-05-10 DIAGNOSIS — I10 Essential (primary) hypertension: Secondary | ICD-10-CM

## 2022-05-10 DIAGNOSIS — I5020 Unspecified systolic (congestive) heart failure: Secondary | ICD-10-CM

## 2022-05-10 NOTE — Patient Instructions (Addendum)
Medication Instructions:  Your Physician recommend you continue on your current medication as directed.    *If you need a refill on your cardiac medications before your next appointment, please call your pharmacy*   Lab Work: Your physician recommends that you return for lab work in 1 week (BMP, CBC, Fasting lipid) at [facility]  If you have labs (blood work) drawn today and your tests are completely normal, you will receive your results only by: [place] (if you have [place]) OR A paper copy in the mail If you have any lab test that is abnormal or we need to change your treatment, we will call you to review the results.   Testing/Procedures: None ordered today   Follow-Up: At [facility], you and your health needs are our priority.  As part of our continuing mission to provide you with exceptional heart care, we have created designated Provider Care Teams.  These Care Teams include your primary Cardiologist (physician) and Advanced Practice Providers (APPs -  Physician Assistants and Nurse Practitioners) who all work together to provide you with the care you need, when you need it.  We recommend signing up for the patient portal called "MyChart".  Sign up information is provided on this After Visit Summary.  MyChart is used to connect with patients for Virtual Visits (Telemedicine).  Patients are able to view lab/test results, encounter notes, upcoming appointments, etc.  Non-urgent messages can be sent to your provider as well.   To learn more about what you can do with MyChart, go to [email or web address].    Your next appointment:   3 month(s)  The format for your next appointment:   In Person  Provider:   Gerrie Nordmann, NP

## 2022-05-10 NOTE — Progress Notes (Signed)
Cardiology Clinic Note   Patient Name: Stacey Richardson Date of Encounter: 05/10/2022  Primary Care Provider:  Theotis Burrow, MD Primary Cardiologist:  [name], MD  Patient Profile    56 year old female with a history of stroke, HFrEF due to ICM, CAD s/p CABG, hypertension, hyperlipidemia, DM2, obesity, SVT s/p ablation, and ICD implanatation, who is here to follow up on her CAD.  Past Medical History    Past Medical History:  Diagnosis Date   CAD (coronary artery disease)    CABG (07/2020; LIMA-LAD, RIMA-OM1)   CVA (cerebral vascular accident) ([place])    Diabetes mellitus    HFrEF (heart failure with reduced ejection fraction) ([place])    HLD (hyperlipidemia)    Hypertension    SVT (supraventricular tachycardia) ([place])    s/p ablation (11/2020)   Past Surgical History:  Procedure Laterality Date   CARDIAC CATHETERIZATION     CLIPPING OF ATRIAL APPENDAGE N/A 08/06/2020   Procedure: CLIPPING OF ATRIAL APPENDAGE USING ATRICURE CLIP SIZE 40MM;  Surgeon: Wonda Olds, MD;  Location: [place];  Service: Open Heart Surgery;  Laterality: N/A;   COLONOSCOPY WITH PROPOFOL N/A 09/14/2021   Procedure: COLONOSCOPY WITH PROPOFOL;  Surgeon: Lin Landsman, MD;  Location: [facility];  Service: Gastroenterology;  Laterality: N/A;   CORONARY ARTERY BYPASS GRAFT N/A 08/06/2020   Procedure: CORONARY ARTERY BYPASS GRAFTING (CABG) X2, USING BILATERAL INTERNAL MAMMARY ARTERIES;  Surgeon: Wonda Olds, MD;  Location: [place];  Service: Open Heart Surgery;  Laterality: N/A;   ICD IMPLANT N/A 12/05/2020   Procedure: ICD IMPLANT;  Surgeon: Vickie Epley, MD;  Location: [place] [facility];  Service: Cardiovascular;  Laterality: N/A;   KNEE SURGERY     RIGHT/LEFT HEART CATH AND CORONARY ANGIOGRAPHY Bilateral 08/02/2020   Procedure: RIGHT/LEFT HEART CATH AND CORONARY ANGIOGRAPHY;  Surgeon: [name], MD;  Location: [place] [facility];  Service: Cardiovascular;   Laterality: Bilateral;   SVT ABLATION N/A 12/05/2020   Procedure: SVT ABLATION;  Surgeon: Vickie Epley, MD;  Location: [place] [facility];  Service: Cardiovascular;  Laterality: N/A;   TEE WITHOUT CARDIOVERSION N/A 07/18/2020   Procedure: TRANSESOPHAGEAL ECHOCARDIOGRAM (TEE);  Surgeon: [name], MD;  Location: [facility];  Service: Cardiovascular;  Laterality: N/A;   TEE WITHOUT CARDIOVERSION N/A 08/06/2020   Procedure: TRANSESOPHAGEAL ECHOCARDIOGRAM (TEE);  Surgeon: Wonda Olds, MD;  Location: [place];  Service: Open Heart Surgery;  Laterality: N/A;    Allergies  Allergies  Allergen Reactions   Lisinopril Cough    History of Present Illness    56 year old female with a history of stroke, HFrEF due to ischemic cardiomyopathy, CAD status post CABG (LIMA-LAD, RIMA-OM 1), hypertension, hyperlipidemia, type 2 diabetes, obesity, SVT status post ablation with slow pathway modification and ICD implementation on 12/05/2020.  Hospitalized 10/21 with acute speech difficulty and dysphagia.  Diagnosed with left frontal lobe infarct, echocardiogram revealed reduced LVEF 35-40%.  Started on Coreg, losartan, aspirin, Plavix, and rosuvastatin.  And follow-up 10/21 noted DOE and a ZIO showed A-fib/flutter with brief episodes of winky block.  Carvedilol was discontinued at that time  ED visit in 11/21 with MRI revealed small subacute appearing white matter infarct in the left corona redated today new compared to previous scans, in the same vicinity as prior ischemia as well as tiny area of cortical encephalomalacia corresponding with the left operculum infarct last month.  07/18/2020 she underwent TEE for evaluation of possible PFO with no evidence  of thrombus, EF 30-35%, negative bubble study, with notation of questionable small tunnel and intra atrial septum.  She was recommended for right/left heart catheterization to rule out ischemia as etiology of heart failure which was performed  07/29/2020 showing severe ostial LMCA stenosis and mild nonobstructive coronary artery disease of the LAD and left circumflex.  She was transferred to [facility] for CABG which was performed on 08/06/2020 with LIMA to the LAD, RIMA to the OM1 and left atrial appendage clipping.  For further evaluation of PFO transcranial Doppler with bubbles was recommended and positive.  On review by Dr. Saunders Revel and Dr. Burt Knack no indication for PFO closure as no clear evidence that PFO exists.  At follow-up with Dr. And 10/11/2020 low-dose losartan as well as carvedilol were initiated for optimization of heart failure therapies.  She was seen by Dr. Quentin Ore 10/18/2020 with a loop recorder placed and discussion initiated regarding ICD for HFrEF as well as potential EP study due to history of SVT.  Repeat echocardiogram 11/08/2020 showed LVEF 3 30-35%, LV global longitudinal strain of -12.2%, and mild MR.  04/20/2021 report occasional lightheadedness with negative orthostatic vital signs.  Losartan was changed to [facility].  Evaluated in the emergency department 07/18/2021 with near syncope.  ER work-up was unremarkable.  Patient thought it was from hypertension however BP was normal.  Echocardiogram was repeated for near syncope which revealed LVEF 45-50%.  She was last seen in clinic in 01/08/2022 and reported chest discomfort that was nonradiating and occurs when she was lying down and had not been consistent.  On 01/17/2021 she underwent Myoview Lexiscan which revealed normal wall motion, EF estimated at 48%, no significant ischemia, low risk scan.  She returns clinic today stating that she has been doing fairly well.  She did follow-up with reports of knee pain that she was having with orthopedics and was prescribed meloxicam 7.5 mg daily.  She did not bring the prescription with her today as she is unsure whether she should start this medication or not.  She denies any shortness of breath, weight gain, or chest discomfort today.  She  also denies any recent hospitalizations or visits to the emergency department.  Home Medications    Current Outpatient Medications  Medication Sig Dispense Refill   acetaminophen (TYLENOL) 500 MG tablet Take 500 mg by mouth every 6 (six) hours as needed for moderate pain or headache.     alum & mag hydroxide-simeth (MAALOX ADVANCED MAX ST) 400-400-40 MG/5ML suspension Take 15 mLs by mouth every 6 (six) hours as needed for indigestion. 355 mL 0   aspirin EC 81 MG tablet Take 81 mg by mouth daily.      atorvastatin (LIPITOR) 80 MG tablet Take 80 mg by mouth at bedtime.     carvedilol (COREG) 6.25 MG tablet Take 1 tablet (6.25 mg total) by mouth 2 (two) times daily. 180 tablet 0   JARDIANCE 10 MG TABS tablet Take 10 mg by mouth daily.     nitroGLYCERIN (NITROSTAT) 0.4 MG SL tablet Place 1 tablet (0.4 mg total) under the tongue every 5 (five) minutes as needed for chest pain. 25 tablet 3   sacubitril-valsartan (ENTRESTO) 24-26 MG Take 1 tablet by mouth 2 (two) times daily. 60 tablet 5   spironolactone (ALDACTONE) 25 MG tablet Take 0.5 tablets (12.5 mg total) by mouth daily. 45 tablet 0   tetrahydrozoline 0.05 % ophthalmic solution Place 1-2 drops into both eyes daily as needed (irritated eyes.). visine  meloxicam (MOBIC) 7.5 MG tablet Take 7.5 mg by mouth 3 (three) times daily. (Patient not taking: Reported on 05/10/2022)     No current facility-administered medications for this visit.     Family History    Family History  Problem Relation Age of Onset   Heart disease Mother        a. ?valve   Heart failure Brother    Hypertension Brother    She indicated that her mother is deceased. She indicated that her father is alive. She indicated that her sister is alive. She indicated that both of her brothers are alive.  Social History    Social History   Socioeconomic History   Marital status: Divorced    Spouse name: Not on file   Number of children: Not on file   Years of education:  Not on file   Highest education level: Not on file  Occupational History   Occupation: on short term disability  Tobacco Use   Smoking status: Former   Smokeless tobacco: Never   Tobacco comments:    Quit 30 years ago  Vaping Use   Vaping Use: Never used  Substance and Sexual Activity   Alcohol use: Yes    Comment: social   Drug use: No   Sexual activity: Not on file  Other Topics Concern   Not on file  Social History Narrative   Lives with boyfriend   Right Handed   Drinks no caffeine daily   Social Determinants of Health   Financial Resource Strain: High Risk (03/08/2021)   Overall Financial Resource Strain (CARDIA)    Difficulty of Paying Living Expenses: Very hard  Food Insecurity: Food Insecurity Present (03/08/2021)   Hunger Vital Sign    Worried About Running Out of Food in the Last Year: Sometimes true    Ran Out of Food in the Last Year: Sometimes true  Transportation Needs: Unmet Transportation Needs (03/08/2021)   PRAPARE - Hydrologist (Medical): No    Lack of Transportation (Non-Medical): Yes  Physical Activity: Not on file  Stress: Not on file  Social Connections: Not on file  Intimate Partner Violence: Not on file     Review of Systems    General:  No chills, fever, night sweats or weight changes.  Cardiovascular:  No chest pain, dyspnea on exertion, edema, orthopnea, palpitations, paroxysmal nocturnal dyspnea. Dermatological: No rash, lesions/masses Respiratory: No cough, dyspnea Urologic: No hematuria, dysuria Musculoskeletal: Endorses knee pain Abdominal:   No nausea, vomiting, diarrhea, bright red blood per rectum, melena, or hematemesis Neurologic:  No visual changes, wkns, changes in mental status. All other systems reviewed and are otherwise negative except as noted above.   Physical Exam    VS:  BP 110/62 (BP Location: Left Arm, Patient Position: Sitting, Cuff Size: Large)   Pulse 78   Ht '5\' 6"'$  (1.676 m)   Wt  198 lb 6.4 oz (90 kg)   LMP 11/21/2020   SpO2 98%   BMI 32.02 kg/m  , BMI Body mass index is 32.02 kg/m.     GEN: Well nourished, well developed, in no acute distress. HEENT: normal. Neck: Supple, no JVD, carotid bruits, or masses. Cardiac: RRR, no murmurs, rubs, or gallops. No clubbing, cyanosis, edema.  Radials/DP/PT 2+ and equal bilaterally.  Respiratory:  Respirations regular and unlabored, clear to auscultation bilaterally. GI: Soft, nontender, nondistended, BS + x 4. MS: no deformity or atrophy. Skin: warm and dry, no rash. Neuro:  Strength and sensation are intact. Psych: Normal affect.  Accessory Clinical Findings    ECG personally reviewed by me today-no new tracings completed today  Lab Results  Component Value Date   WBC 6.9 12/13/2021   HGB 14.1 12/13/2021   HCT 44.1 12/13/2021   MCV 88.0 12/13/2021   PLT 280 12/13/2021   Lab Results  Component Value Date   CREATININE 0.76 12/13/2021   BUN 19 12/13/2021   NA 138 12/13/2021   K 3.7 12/13/2021   CL 105 12/13/2021   CO2 26 12/13/2021   Lab Results  Component Value Date   ALT 17 12/13/2021   AST 16 12/13/2021   ALKPHOS 66 12/13/2021   BILITOT 0.3 12/13/2021   Lab Results  Component Value Date   CHOL 113 12/11/2020   HDL 41 12/11/2020   LDLCALC 59 12/11/2020   TRIG 60 12/11/2020   CHOLHDL 2.8 12/11/2020    Lab Results  Component Value Date   HGBA1C 6.5 (H) 12/11/2020    Assessment & Plan   1.  CAD status post CABG with 3 episodes in the last 6 months of sharp sticking chest discomfort over ICD insertion site.  Relatively atypical.  She has not required the use of her statin or nitro.  She did undergo an exercise myocardial perfusion that showed no significant ischemia, normal wall motion, EF estimated at 48 percent.  Exercise time was 6 minutes and she achieved 7 METS, was considered low risk scan.  We did discuss potentially taking long-acting nitrates and with the relatively short episodes and few  episodes of chest discomfort that she has had with started on hold off on that therapy at this time but can be considered later on if she continues with episodes of chest discomfort, Of note antianginal therapy was held off due to soft blood pressures previously. she is continue aspirin, statin, and beta-blocker therapy.  2.  HFrEF, ischemic cardiomyopathy status post ICD placement with most recent echo in 09/2021 showed improved LVEF of 45-50%, no WMA, mild MR, she remains euvolemic on exam.  She is continued on GDMT with carvedilol, Jardiance, Entresto, and Aldactone.  Her ICD continues to be followed by EP.  She has been advised to continue to limit her sodium intake and fluid.  We have also encouraged her to continue to do daily weights in the morning after she wakes empties her bladder prior to getting dressed for the day has weight gain is an early sign of fluid retention before shortness of breath and peripheral edema.  She is also being sent for labs for BMP and CBC as her last blood work was done in April of this year she continues on Entresto and Aldactone we will monitor her electrolytes and kidney function and CBC will be on aspirin.  3.  Hypertension with blood pressure today 110/62.  Continue carvedilol 6.25 mg twice daily.  No refills required today for medications.  4.  Hyperlipidemia with last LDL of 60.  Continue with Lipitor 80 mg daily.  Last lipid panel was 12/11/20.  5. SVT s/p ablation without further reoccurrence.  6. Cryptogenic stroke that has been stable. Continue ASA and statin. Followed by neurology.  7. Disposition patient to return to clinic in 3 months to see MD/APP or sooner if needed.     Kadee Philyaw, NP 05/10/2022, 4:36 PM

## 2022-05-15 ENCOUNTER — Telehealth: Payer: Self-pay

## 2022-05-15 NOTE — Telephone Encounter (Signed)
Prior Authorization for Entresto 24-26 mg tablets initiated by [email or web address] KEY: [id number] Response: This request has received a Favorable outcome. PA Case: [id number], Status: Approved, Coverage Starts on: 05/15/2022 12:00:00 AM, Coverage Ends on: 05/15/2023 12:00:00 AM.

## 2022-05-16 ENCOUNTER — Other Ambulatory Visit
Admission: RE | Admit: 2022-05-16 | Discharge: 2022-05-16 | Disposition: A | Discharge status: Home / Self Care | Payer: Medicaid Other | Attending: Cardiology | Admitting: Cardiology

## 2022-05-16 DIAGNOSIS — Z79899 Other long term (current) drug therapy: Secondary | ICD-10-CM | POA: Insufficient documentation

## 2022-05-16 LAB — LIPID PANEL
Cholesterol: 151 mg/dL (ref 0–200)
HDL: 49 mg/dL (ref >40)
LDL Cholesterol: 87 mg/dL (ref 0–99)
Total CHOL/HDL Ratio: 3.1 RATIO
Triglycerides: 73 mg/dL (ref <150)
VLDL: 15 mg/dL (ref 0–40)

## 2022-05-16 LAB — BASIC METABOLIC PANEL
Anion gap: 8 (ref 5–15)
BUN: 16 mg/dL (ref 6–20)
CO2: 25 mmol/L (ref 22–32)
Calcium: 9.3 mg/dL (ref 8.9–10.3)
Chloride: 106 mmol/L (ref 98–111)
Creatinine, Ser: 0.62 mg/dL (ref 0.44–1.00)
GFR, Estimated: >60 mL/min (ref >60)
Glucose, Bld: 254 mg/dL — ABNORMAL HIGH (ref 70–99)
Potassium: 4 mmol/L (ref 3.5–5.1)
Sodium: 139 mmol/L (ref 135–145)

## 2022-05-16 LAB — CBC
HCT: 43.4 % (ref 36.0–46.0)
Hemoglobin: 14 g/dL (ref 12.0–15.0)
MCH: 28.5 pg (ref 26.0–34.0)
MCHC: 32.3 g/dL (ref 30.0–36.0)
MCV: 88.2 fL (ref 80.0–100.0)
Platelets: 252 10*3/uL (ref 150–400)
RBC: 4.92 MIL/uL (ref 3.87–5.11)
RDW: 12.4 % (ref 11.5–15.5)
WBC: 6.2 10*3/uL (ref 4.0–10.5)
nRBC: 0 % (ref 0.0–0.2)

## 2022-06-04 DIAGNOSIS — Z0271 Encounter for disability determination: Secondary | ICD-10-CM

## 2022-06-05 ENCOUNTER — Ambulatory Visit (INDEPENDENT_AMBULATORY_CARE_PROVIDER_SITE_OTHER): Payer: Medicaid Other

## 2022-06-05 DIAGNOSIS — I429 Cardiomyopathy, unspecified: Secondary | ICD-10-CM | POA: Diagnosis not present

## 2022-06-06 LAB — CUP PACEART REMOTE DEVICE CHECK
Battery Voltage: 3.1 V
Brady Statistic RV Percent Paced: 0 %
Date Time Interrogation Session: 20230927081459
HighPow Impedance: 75 Ohm
Implantable Lead Implant Date: 20220329
Implantable Lead Location: 753860
Implantable Lead Model: 436909
Implantable Lead Serial Number: 81437635
Implantable Pulse Generator Implant Date: 20220329
Lead Channel Impedance Value: 579 Ohm
Lead Channel Pacing Threshold Amplitude: 0.8 V
Lead Channel Pacing Threshold Pulse Width: 0.4 ms
Lead Channel Sensing Intrinsic Amplitude: 11.5 mV
Lead Channel Sensing Intrinsic Amplitude: 6.3 mV
Lead Channel Setting Pacing Amplitude: 3 V
Lead Channel Setting Pacing Pulse Width: 0.4 ms
Lead Channel Setting Sensing Sensitivity: 0.8 mV
Pulse Gen Model: 429525
Pulse Gen Serial Number: 84824217

## 2022-06-08 ENCOUNTER — Other Ambulatory Visit: Payer: Self-pay | Admitting: Medical

## 2022-06-08 DIAGNOSIS — I5022 Chronic systolic (congestive) heart failure: Secondary | ICD-10-CM

## 2022-06-08 DIAGNOSIS — I10 Essential (primary) hypertension: Secondary | ICD-10-CM

## 2022-06-13 NOTE — Progress Notes (Signed)
Remote ICD transmission.   

## 2022-07-08 ENCOUNTER — Ambulatory Visit: Payer: Medicaid Other | Admitting: Internal Medicine

## 2022-08-09 ENCOUNTER — Ambulatory Visit: Payer: Medicaid Other | Admitting: Cardiology

## 2022-08-26 ENCOUNTER — Ambulatory Visit: Payer: Medicaid Other | Admitting: Internal Medicine

## 2022-08-26 NOTE — Progress Notes (Deleted)
Name: Stacey Richardson  MRN/ DOB: [id number], [birth year]   Age/ Sex: 56 y.o., female    PCP: Alene Mires [name], MD   Reason for Endocrinology Evaluation: Type 2 Diabetes Mellitus     Date of Initial Endocrinology Visit: 08/26/2022     PATIENT IDENTIFIER: Stacey Richardson is a 56 y.o. female with a past medical history of DM, HTN, CHF, and dyslipidemia. The patient presented for initial endocrinology clinic visit on 08/26/2022 for consultative assistance with her diabetes management.    HPI: Ms. Thaker was    Diagnosed with DM *** Prior Medications tried/Intolerance: *** Currently checking blood sugars *** x / day,  before breakfast and ***.  Hypoglycemia episodes : ***               Symptoms: ***                 Frequency: ***/  Hemoglobin A1c has ranged from 6.2% in 07/2020, peaking at 7.0% in 06/2020. Patient required assistance for hypoglycemia:  Patient has required hospitalization within the last 1 year from hyper or hypoglycemia:   In terms of diet, the patient ***  ADRENAL HISTORY: She had an incidental finding of a left adrenal nodule at 1.5 cm on CT imaging of the abdomen on 12/13/2021    HOME DIABETES REGIMEN: Jardiance 10 mg daily   Statin: Yes ACE-I/ARB: Yes   METER DOWNLOAD SUMMARY: Date range evaluated: *** Fingerstick Blood Glucose Tests = *** Average Number Tests/Day = *** Overall Mean FS Glucose = *** Standard Deviation = ***  BG Ranges: Low = *** High = ***   Hypoglycemic Events/30 Days: BG < 50 = *** Episodes of symptomatic severe hypoglycemia = ***   DIABETIC COMPLICATIONS: Microvascular complications:  *** Denies: CKD Last eye exam: Completed   Macrovascular complications:  CAD, CVA Denies:  PVD   PAST HISTORY: Past Medical History:  Past Medical History:  Diagnosis Date   CAD (coronary artery disease)    CABG (07/2020; LIMA-LAD, RIMA-OM1)   CVA (cerebral vascular accident) ([place])    Diabetes mellitus    HFrEF (heart  failure with reduced ejection fraction) ([place])    HLD (hyperlipidemia)    Hypertension    SVT (supraventricular tachycardia) ([place])    s/p ablation (11/2020)   Past Surgical History:  Past Surgical History:  Procedure Laterality Date   CARDIAC CATHETERIZATION     CLIPPING OF ATRIAL APPENDAGE N/A 08/06/2020   Procedure: CLIPPING OF ATRIAL APPENDAGE USING ATRICURE CLIP SIZE 40MM;  Surgeon: Wonda Olds, MD;  Location: [place];  Service: Open Heart Surgery;  Laterality: N/A;   COLONOSCOPY WITH PROPOFOL N/A 09/14/2021   Procedure: COLONOSCOPY WITH PROPOFOL;  Surgeon: Lin Landsman, MD;  Location: [facility] END[facility]COPY;  Service: Gastroenterology;  Laterality: N/A;   CORONARY ARTERY BYPASS GRAFT N/A 08/06/2020   Procedure: CORONARY ARTERY BYPASS GRAFTING (CABG) X2, USING BILATERAL INTERNAL MAMMARY ARTERIES;  Surgeon: Wonda Olds, MD;  Location: [place];  Service: Open Heart Surgery;  Laterality: N/A;   ICD IMPLANT N/A 12/05/2020   Procedure: ICD IMPLANT;  Surgeon: Vickie Epley, MD;  Location: [place] [facility];  Service: Cardiovascular;  Laterality: N/A;   KNEE SURGERY     RIGHT/LEFT HEART CATH AND CORONARY ANGIOGRAPHY Bilateral 08/02/2020   Procedure: RIGHT/LEFT HEART CATH AND CORONARY ANGIOGRAPHY;  Surgeon: [name], MD;  Location: [place] [facility];  Service: Cardiovascular;  Laterality: Bilateral;   SVT ABLATION N/A 12/05/2020   Procedure: SVT ABLATION;  Surgeon: Vickie Epley, MD;  Location: [place] [facility];  Service: Cardiovascular;  Laterality: N/A;   TEE WITHOUT CARDIOVERSION N/A 07/18/2020   Procedure: TRANSESOPHAGEAL ECHOCARDIOGRAM (TEE);  Surgeon: [name], MD;  Location: [facility];  Service: Cardiovascular;  Laterality: N/A;   TEE WITHOUT CARDIOVERSION N/A 08/06/2020   Procedure: TRANSESOPHAGEAL ECHOCARDIOGRAM (TEE);  Surgeon: Wonda Olds, MD;  Location: [place];  Service: Open Heart Surgery;  Laterality: N/A;    Social History:  reports that she  has quit smoking. She has never used smokeless tobacco. She reports current alcohol use. She reports that she does not use drugs. Family History:  Family History  Problem Relation Age of Onset   Heart disease Mother        a. ?valve   Heart failure Brother    Hypertension Brother      HOME MEDICATIONS: Allergies as of 08/26/2022       Reactions   Lisinopril Cough        Medication List        Accurate as of August 26, 2022  7:08 AM. If you have any questions, ask your nurse or doctor.          acetaminophen 500 MG tablet Commonly known as: TYLENOL Take 500 mg by mouth every 6 (six) hours as needed for moderate pain or headache.   alum & mag hydroxide-simeth 786-767-20 MG/5ML suspension Commonly known as: Maalox Advanced Max St Take 15 mLs by mouth every 6 (six) hours as needed for indigestion.   aspirin EC 81 MG tablet Take 81 mg by mouth daily.   atorvastatin 80 MG tablet Commonly known as: LIPITOR Take 80 mg by mouth at bedtime.   carvedilol 6.25 MG tablet Commonly known as: COREG Take 1 tablet (6.25 mg total) by mouth 2 (two) times daily.   Entresto 24-26 MG Generic drug: sacubitril-valsartan Take 1 tablet by mouth 2 (two) times daily.   Jardiance 10 MG Tabs tablet Generic drug: empagliflozin Take 10 mg by mouth daily.   meloxicam 7.5 MG tablet Commonly known as: MOBIC Take 7.5 mg by mouth 3 (three) times daily.   nitroGLYCERIN 0.4 MG SL tablet Commonly known as: NITROSTAT Place 1 tablet (0.4 mg total) under the tongue every 5 (five) minutes as needed for chest pain.   spironolactone 25 MG tablet Commonly known as: ALDACTONE TAKE 1/2 TABLET BY MOUTH EVERY DAY   tetrahydrozoline 0.05 % ophthalmic solution Place 1-2 drops into both eyes daily as needed (irritated eyes.). visine         ALLERGIES: Allergies  Allergen Reactions   Lisinopril Cough     REVIEW OF SYSTEMS: A comprehensive ROS was conducted with the patient and is  negative except as per HPI and below:  ROS    OBJECTIVE:   VITAL SIGNS: LMP 11/21/2020    PHYSICAL EXAM:  General: Pt appears well and is in NAD  Neck: General: Supple without adenopathy or carotid bruits. Thyroid: Thyroid size normal.  No goiter or nodules appreciated.   Lungs: Clear with good BS bilat with no rales, rhonchi, or wheezes  Heart: RRR   Abdomen:  soft, nontender  Extremities:  Lower extremities - No pretibial edema. No lesions.  Skin: Normal texture and temperature to palpation. No rash noted.  Neuro: MS is good with appropriate affect, pt is alert and Ox3    DM foot exam:    DATA REVIEWED:  Lab Results  Component Value Date   HGBA1C 6.5 (H) 12/11/2020  HGBA1C 6.2 (H) 08/05/2020   HGBA1C 7.0 (H) 06/12/2020   Lab Results  Component Value Date   LDLCALC 87 05/16/2022   CREATININE 0.62 05/16/2022   No results found for: "MICRALBCREAT"  Lab Results  Component Value Date   CHOL 151 05/16/2022   HDL 49 05/16/2022   LDLCALC 87 05/16/2022   TRIG 73 05/16/2022   CHOLHDL 3.1 05/16/2022        ASSESSMENT / PLAN / RECOMMENDATIONS:   1) Type 2 Diabetes Mellitus, ***controlled, With macrovascular complications - Most recent A1c of *** %. Goal A1c < 7.0 %.    Plan: GENERAL: ***  MEDICATIONS: ***  EDUCATION / INSTRUCTIONS: BG monitoring instructions: Patient is instructed to check her blood sugars *** times a day, ***. Call [place] [facility] if: BG persistently < 70  I reviewed the Rule of 15 for the treatment of hypoglycemia in detail with the patient. Literature supplied.   2) Diabetic complications:  Eye: Does *** have known diabetic retinopathy.  Neuro/ Feet: Does *** have known diabetic peripheral neuropathy. Renal: Patient does not have known baseline CKD. She is *** on an ACEI/ARB at present.  3) Left adrenal nodule:     Signed electronically by: Mack Guise, MD  [facility]  [place]  [address] [place], [place] [address] Phone: [phone] FAX: [phone]   CC: Theotis Burrow, MD [address] [place] [address] Phone: [phone]  Fax: [phone]    Return to Endocrinology clinic as below: Future Appointments  Date Time Provider [place]  08/26/2022  8:10 AM Jagdeep Ancheta, Melanie Crazier, MD [facility] None  09/04/2022  7:00 AM [facility] DEVICE REMOTES [facility]  09/10/2022  3:10 PM Gerrie Nordmann, NP [facility] None  12/04/2022  7:00 AM [facility] DEVICE REMOTES [facility]  03/05/2023  7:00 AM [facility] DEVICE REMOTES [facility]

## 2022-09-04 ENCOUNTER — Ambulatory Visit (INDEPENDENT_AMBULATORY_CARE_PROVIDER_SITE_OTHER): Payer: Medicaid Other

## 2022-09-04 DIAGNOSIS — I429 Cardiomyopathy, unspecified: Secondary | ICD-10-CM

## 2022-09-05 LAB — CUP PACEART REMOTE DEVICE CHECK
Battery Voltage: 3.11 V
Date Time Interrogation Session: 20231224080308
HighPow Impedance: 75 Ohm
Implantable Lead Connection Status: 753985
Implantable Lead Implant Date: 20220329
Implantable Lead Location: 753860
Implantable Lead Model: 436909
Implantable Lead Serial Number: 81437635
Implantable Pulse Generator Implant Date: 20220329
Lead Channel Impedance Value: 579 Ohm
Lead Channel Pacing Threshold Amplitude: 0.7 V
Lead Channel Pacing Threshold Pulse Width: 0.4 ms
Lead Channel Sensing Intrinsic Amplitude: 12.1 mV
Lead Channel Sensing Intrinsic Amplitude: 8 mV
Lead Channel Setting Pacing Amplitude: 3 V
Lead Channel Setting Pacing Pulse Width: 0.4 ms
Lead Channel Setting Sensing Sensitivity: 0.8 mV
Pulse Gen Model: 429525
Pulse Gen Serial Number: 84824217

## 2022-09-10 ENCOUNTER — Ambulatory Visit: Payer: Medicaid Other | Admitting: Cardiology

## 2022-09-10 NOTE — Progress Notes (Deleted)
Cardiology Clinic Note   Patient Name: Stacey Richardson Date of Encounter: 09/10/2022  Primary Care Provider:  Theotis Burrow, MD Primary Cardiologist:  [name], MD  Patient Profile    ***  Past Medical History    Past Medical History:  Diagnosis Date   CAD (coronary artery disease)    CABG (07/2020; LIMA-LAD, RIMA-OM1)   CVA (cerebral vascular accident) ([place])    Diabetes mellitus    HFrEF (heart failure with reduced ejection fraction) ([place])    HLD (hyperlipidemia)    Hypertension    SVT (supraventricular tachycardia) ([place])    s/p ablation (11/2020)   Past Surgical History:  Procedure Laterality Date   CARDIAC CATHETERIZATION     CLIPPING OF ATRIAL APPENDAGE N/A 08/06/2020   Procedure: CLIPPING OF ATRIAL APPENDAGE USING ATRICURE CLIP SIZE 40MM;  Surgeon: Wonda Olds, MD;  Location: [place];  Service: Open Heart Surgery;  Laterality: N/A;   COLONOSCOPY WITH PROPOFOL N/A 09/14/2021   Procedure: COLONOSCOPY WITH PROPOFOL;  Surgeon: Lin Landsman, MD;  Location: [facility] ENDOSCOPY;  Service: Gastroenterology;  Laterality: N/A;   CORONARY ARTERY BYPASS GRAFT N/A 08/06/2020   Procedure: CORONARY ARTERY BYPASS GRAFTING (CABG) X2, USING BILATERAL INTERNAL MAMMARY ARTERIES;  Surgeon: Wonda Olds, MD;  Location: [place];  Service: Open Heart Surgery;  Laterality: N/A;   ICD IMPLANT N/A 12/05/2020   Procedure: ICD IMPLANT;  Surgeon: Vickie Epley, MD;  Location: [place] [facility];  Service: Cardiovascular;  Laterality: N/A;   KNEE SURGERY     RIGHT/LEFT HEART CATH AND CORONARY ANGIOGRAPHY Bilateral 08/02/2020   Procedure: RIGHT/LEFT HEART CATH AND CORONARY ANGIOGRAPHY;  Surgeon: [name], MD;  Location: [place] [facility];  Service: Cardiovascular;  Laterality: Bilateral;   SVT ABLATION N/A 12/05/2020   Procedure: SVT ABLATION;  Surgeon: Vickie Epley, MD;  Location: [place] [facility];  Service: Cardiovascular;  Laterality: N/A;   TEE  WITHOUT CARDIOVERSION N/A 07/18/2020   Procedure: TRANSESOPHAGEAL ECHOCARDIOGRAM (TEE);  Surgeon: [name], MD;  Location: [facility];  Service: Cardiovascular;  Laterality: N/A;   TEE WITHOUT CARDIOVERSION N/A 08/06/2020   Procedure: TRANSESOPHAGEAL ECHOCARDIOGRAM (TEE);  Surgeon: Wonda Olds, MD;  Location: [place];  Service: Open Heart Surgery;  Laterality: N/A;    Allergies  Allergies  Allergen Reactions   Lisinopril Cough    History of Present Illness    ***  Home Medications    Current Outpatient Medications  Medication Sig Dispense Refill   acetaminophen (TYLENOL) 500 MG tablet Take 500 mg by mouth every 6 (six) hours as needed for moderate pain or headache.     alum & mag hydroxide-simeth (MAALOX ADVANCED MAX ST) 400-400-40 MG/5ML suspension Take 15 mLs by mouth every 6 (six) hours as needed for indigestion. 355 mL 0   aspirin EC 81 MG tablet Take 81 mg by mouth daily.      atorvastatin (LIPITOR) 80 MG tablet Take 80 mg by mouth at bedtime.     carvedilol (COREG) 6.25 MG tablet Take 1 tablet (6.25 mg total) by mouth 2 (two) times daily. 180 tablet 0   JARDIANCE 10 MG TABS tablet Take 10 mg by mouth daily.     meloxicam (MOBIC) 7.5 MG tablet Take 7.5 mg by mouth 3 (three) times daily. (Patient not taking: Reported on 05/10/2022)     nitroGLYCERIN (NITROSTAT) 0.4 MG SL tablet Place 1 tablet (0.4 mg total) under the tongue every 5 (five) minutes as needed  for chest pain. 25 tablet 3   sacubitril-valsartan (ENTRESTO) 24-26 MG Take 1 tablet by mouth 2 (two) times daily. 60 tablet 5   spironolactone (ALDACTONE) 25 MG tablet TAKE 1/2 TABLET BY MOUTH EVERY DAY 90 tablet 3   tetrahydrozoline 0.05 % ophthalmic solution Place 1-2 drops into both eyes daily as needed (irritated eyes.). visine     No current facility-administered medications for this visit.     Family History    Family History  Problem Relation Age of Onset   Heart disease Mother        a. ?valve   Heart  failure Brother    Hypertension Brother    She indicated that her mother is deceased. She indicated that her father is alive. She indicated that her sister is alive. She indicated that both of her brothers are alive.  Social History    Social History   Socioeconomic History   Marital status: Divorced    Spouse name: Not on file   Number of children: Not on file   Years of education: Not on file   Highest education level: Not on file  Occupational History   Occupation: on short term disability  Tobacco Use   Smoking status: Former   Smokeless tobacco: Never   Tobacco comments:    Quit 30 years ago  Vaping Use   Vaping Use: Never used  Substance and Sexual Activity   Alcohol use: Yes    Comment: social   Drug use: No   Sexual activity: Not on file  Other Topics Concern   Not on file  Social History Narrative   Lives with boyfriend   Right Handed   Drinks no caffeine daily   Social Determinants of Health   Financial Resource Strain: High Risk (03/08/2021)   Overall Financial Resource Strain (CARDIA)    Difficulty of Paying Living Expenses: Very hard  Food Insecurity: Food Insecurity Present (03/08/2021)   Hunger Vital Sign    Worried About Running Out of Food in the Last Year: Sometimes true    Ran Out of Food in the Last Year: Sometimes true  Transportation Needs: Unmet Transportation Needs (03/08/2021)   PRAPARE - Hydrologist (Medical): No    Lack of Transportation (Non-Medical): Yes  Physical Activity: Not on file  Stress: Not on file  Social Connections: Not on file  Intimate Partner Violence: Not on file     Review of Systems    General:  No chills, fever, night sweats or weight changes.  Cardiovascular:  No chest pain, dyspnea on exertion, edema, orthopnea, palpitations, paroxysmal nocturnal dyspnea. Dermatological: No rash, lesions/masses Respiratory: No cough, dyspnea Urologic: No hematuria, dysuria Abdominal:   No nausea,  vomiting, diarrhea, bright red blood per rectum, melena, or hematemesis Neurologic:  No visual changes, wkns, changes in mental status. All other systems reviewed and are otherwise negative except as noted above.     Physical Exam    VS:  LMP 11/21/2020  , BMI There is no height or weight on file to calculate BMI.     GEN: Well nourished, well developed, in no acute distress. HEENT: normal. Neck: Supple, no JVD, carotid bruits, or masses. Cardiac: RRR, no murmurs, rubs, or gallops. No clubbing, cyanosis, edema.  Radials 2+/PT 2+ and equal bilaterally.  Respiratory:  Respirations regular and unlabored, clear to auscultation bilaterally. GI: Soft, nontender, nondistended, BS + x 4. MS: no deformity or atrophy. Skin: warm and dry, no rash. Neuro:  Strength and sensation are intact. Psych: Normal affect.  Accessory Clinical Findings    ECG personally reviewed by me today- *** - No acute changes  Lab Results  Component Value Date   WBC 6.2 05/16/2022   HGB 14.0 05/16/2022   HCT 43.4 05/16/2022   MCV 88.2 05/16/2022   PLT 252 05/16/2022   Lab Results  Component Value Date   CREATININE 0.62 05/16/2022   BUN 16 05/16/2022   NA 139 05/16/2022   K 4.0 05/16/2022   CL 106 05/16/2022   CO2 25 05/16/2022   Lab Results  Component Value Date   ALT 17 12/13/2021   AST 16 12/13/2021   ALKPHOS 66 12/13/2021   BILITOT 0.3 12/13/2021   Lab Results  Component Value Date   CHOL 151 05/16/2022   HDL 49 05/16/2022   LDLCALC 87 05/16/2022   TRIG 73 05/16/2022   CHOLHDL 3.1 05/16/2022    Lab Results  Component Value Date   HGBA1C 6.5 (H) 12/11/2020    Assessment & Plan   1.  ***  Ramesha Poster, NP 09/10/2022, 10:50 AM

## 2022-09-24 NOTE — Progress Notes (Signed)
Remote ICD transmission.   

## 2022-09-30 ENCOUNTER — Encounter: Payer: Self-pay | Admitting: Cardiology

## 2022-09-30 ENCOUNTER — Ambulatory Visit: Payer: Medicaid Other | Attending: Cardiology | Admitting: Cardiology

## 2022-09-30 VITALS — BP 109/74 | HR 71 | Ht 66.0 in | Wt 208.0 lb

## 2022-09-30 DIAGNOSIS — I10 Essential (primary) hypertension: Secondary | ICD-10-CM

## 2022-09-30 DIAGNOSIS — Z951 Presence of aortocoronary bypass graft: Secondary | ICD-10-CM | POA: Diagnosis not present

## 2022-09-30 DIAGNOSIS — I5020 Unspecified systolic (congestive) heart failure: Secondary | ICD-10-CM | POA: Diagnosis not present

## 2022-09-30 DIAGNOSIS — I471 Supraventricular tachycardia, unspecified: Secondary | ICD-10-CM

## 2022-09-30 DIAGNOSIS — E1169 Type 2 diabetes mellitus with other specified complication: Secondary | ICD-10-CM

## 2022-09-30 DIAGNOSIS — E785 Hyperlipidemia, unspecified: Secondary | ICD-10-CM

## 2022-09-30 DIAGNOSIS — I639 Cerebral infarction, unspecified: Secondary | ICD-10-CM

## 2022-09-30 DIAGNOSIS — I2510 Atherosclerotic heart disease of native coronary artery without angina pectoris: Secondary | ICD-10-CM

## 2022-09-30 DIAGNOSIS — E119 Type 2 diabetes mellitus without complications: Secondary | ICD-10-CM

## 2022-09-30 NOTE — Progress Notes (Signed)
Cardiology Clinic Note   Patient Name: Stacey Richardson Date of Encounter: 09/30/2022  Primary Care Provider:  Theotis Burrow, MD Primary Cardiologist:  [name], MD  Patient Profile    57 year old female with a history of stroke, HFrEF due to ICM, CAD status post CABG, hypertension, hyperlipidemia, type 2 diabetes, obesity, SVT status post ablation, and ICD insertion, who is here today to follow-up on her CAD.  Past Medical History    Past Medical History:  Diagnosis Date   CAD (coronary artery disease)    CABG (07/2020; LIMA-LAD, RIMA-OM1)   CVA (cerebral vascular accident) ([place])    Diabetes mellitus    HFrEF (heart failure with reduced ejection fraction) ([place])    HLD (hyperlipidemia)    Hypertension    SVT (supraventricular tachycardia)    s/p ablation (11/2020)   Past Surgical History:  Procedure Laterality Date   CARDIAC CATHETERIZATION     CLIPPING OF ATRIAL APPENDAGE N/A 08/06/2020   Procedure: CLIPPING OF ATRIAL APPENDAGE USING ATRICURE CLIP SIZE 40MM;  Surgeon: Wonda Olds, MD;  Location: [place];  Service: Open Heart Surgery;  Laterality: N/A;   COLONOSCOPY WITH PROPOFOL N/A 09/14/2021   Procedure: COLONOSCOPY WITH PROPOFOL;  Surgeon: Lin Landsman, MD;  Location: [facility] ENDOSCOPY;  Service: Gastroenterology;  Laterality: N/A;   CORONARY ARTERY BYPASS GRAFT N/A 08/06/2020   Procedure: CORONARY ARTERY BYPASS GRAFTING (CABG) X2, USING BILATERAL INTERNAL MAMMARY ARTERIES;  Surgeon: Wonda Olds, MD;  Location: [place];  Service: Open Heart Surgery;  Laterality: N/A;   ICD IMPLANT N/A 12/05/2020   Procedure: ICD IMPLANT;  Surgeon: Vickie Epley, MD;  Location: [place] [facility];  Service: Cardiovascular;  Laterality: N/A;   KNEE SURGERY     RIGHT/LEFT HEART CATH AND CORONARY ANGIOGRAPHY Bilateral 08/02/2020   Procedure: RIGHT/LEFT HEART CATH AND CORONARY ANGIOGRAPHY;  Surgeon: [name], MD;  Location: [place] CV LAB;   Service: Cardiovascular;  Laterality: Bilateral;   SVT ABLATION N/A 12/05/2020   Procedure: SVT ABLATION;  Surgeon: Vickie Epley, MD;  Location: [place] [facility] LAB;  Service: Cardiovascular;  Laterality: N/A;   TEE WITHOUT CARDIOVERSION N/A 07/18/2020   Procedure: TRANSESOPHAGEAL ECHOCARDIOGRAM (TEE);  Surgeon: [name], MD;  Location: [facility] ORS;  Service: Cardiovascular;  Laterality: N/A;   TEE WITHOUT CARDIOVERSION N/A 08/06/2020   Procedure: TRANSESOPHAGEAL ECHOCARDIOGRAM (TEE);  Surgeon: Wonda Olds, MD;  Location: [place];  Service: Open Heart Surgery;  Laterality: N/A;    Allergies  Allergies  Allergen Reactions   Lisinopril Cough    History of Present Illness    Stacey Richardson is a 57 year old female with a history of stroke, HFrEF due to ischemic cardiomyopathy, CAD status post CABG (LIMA-LAD, RIMA-OM 1), hypertension, hyperlipidemia, type 2 diabetes, obesity, SVT status post ablation of the slow pathway modification ICD insertion on 12/05/2020.  During hospitalization in 10/21 with acute speech difficulty and dysphagia she was diagnosed with left frontal lobe infarct.  Echocardiogram at that time revealed LVEF of 35-40%.  ZIO showed A-fib/a flutter with brief episodes of winky block and beta-blocker therapy was discontinued at that time.  She returned back to the emergency department in 11/21 with MRI revealed small subacute appearing white matter infarct in the left corona.  TEE for evaluation of possible PFO with no evidence of thrombus, EF 30 to 35%, negative bubble study, with notation of questionable small tunnel and intra atrial septum.  R/LH see done 07/29/2020 showed severe ostial  LMCA stenosis with mild nonobstructive coronary artery disease of the LAD and left circumflex.  She was transferred to [facility] for CABG that was performed on 08/06/2020 with LIMA-LAD, RIMA-OM 1 and left atrial appendage clipping.  Repeat echocardiogram done 11/08/2020 showed LVEF of  30-35%, LV global longitudinal strain of -12.2% and mild MR.  Evaluated for chest discomfort in 01/26/22 with Lexiscan MPI completed which revealed low risk scan, no significant ischemia, normal wall motion, EF estimated at 48%.  She was last seen 05/10/2022 where she was doing fairly well other than she was having knee pain.  She was continued on her current medication regimen without further testing that was ordered at that time.  She returns to clinic today stating that she has been doing well. She had some fleeting episodes of chest discomfort that was resolved with wardrobe changes. Denies any shortness of breath, peripheral edema, palpitations, or earlier satiety. Continues to have some joint pain in the knees and some occasional dizziness if she changes position too quickly. Denies any hospitalizations or visits to the emergency department.   Home Medications    Current Outpatient Medications  Medication Sig Dispense Refill   acetaminophen (TYLENOL) 500 MG tablet Take 500 mg by mouth every 6 (six) hours as needed for moderate pain or headache.     alum & mag hydroxide-simeth (MAALOX ADVANCED MAX ST) 400-400-40 MG/5ML suspension Take 15 mLs by mouth every 6 (six) hours as needed for indigestion. 355 mL 0   aspirin EC 81 MG tablet Take 81 mg by mouth daily.      atorvastatin (LIPITOR) 80 MG tablet Take 80 mg by mouth at bedtime.     carvedilol (COREG) 6.25 MG tablet Take 1 tablet (6.25 mg total) by mouth 2 (two) times daily. 180 tablet 0   escitalopram (LEXAPRO) 10 MG tablet Take 10 mg by mouth daily.     JARDIANCE 10 MG TABS tablet Take 10 mg by mouth daily.     nitroGLYCERIN (NITROSTAT) 0.4 MG SL tablet Place 1 tablet (0.4 mg total) under the tongue every 5 (five) minutes as needed for chest pain. 25 tablet 3   sacubitril-valsartan (ENTRESTO) 24-26 MG Take 1 tablet by mouth 2 (two) times daily. 60 tablet 5   spironolactone (ALDACTONE) 25 MG tablet TAKE 1/2 TABLET BY MOUTH EVERY DAY 90 tablet 3    tetrahydrozoline 0.05 % ophthalmic solution Place 1-2 drops into both eyes daily as needed (irritated eyes.). visine     meloxicam (MOBIC) 7.5 MG tablet Take 7.5 mg by mouth 3 (three) times daily. (Patient not taking: Reported on 05/10/2022)     OZEMPIC, 0.25 OR 0.5 MG/DOSE, 2 MG/3ML SOPN Inject 0.25 mg into the skin once a week.     No current facility-administered medications for this visit.     Family History    Family History  Problem Relation Age of Onset   Heart disease Mother        a. ?valve   Heart failure Brother    Hypertension Brother    She indicated that her mother is deceased. She indicated that her father is alive. She indicated that her sister is alive. She indicated that both of her brothers are alive.  Social History    Social History   Socioeconomic History   Marital status: Divorced    Spouse name: Not on file   Number of children: Not on file   Years of education: Not on file   Highest education level: Not on  file  Occupational History   Occupation: on short term disability  Tobacco Use   Smoking status: Former   Smokeless tobacco: Never   Tobacco comments:    Quit 30 years ago  Vaping Use   Vaping Use: Never used  Substance and Sexual Activity   Alcohol use: Yes    Comment: social   Drug use: No   Sexual activity: Not on file  Other Topics Concern   Not on file  Social History Narrative   Lives with boyfriend   Right Handed   Drinks no caffeine daily   Social Determinants of Health   Financial Resource Strain: High Risk (03/08/2021)   Overall Financial Resource Strain (CARDIA)    Difficulty of Paying Living Expenses: Very hard  Food Insecurity: Food Insecurity Present (03/08/2021)   Hunger Vital Sign    Worried About Running Out of Food in the Last Year: Sometimes true    Ran Out of Food in the Last Year: Sometimes true  Transportation Needs: Unmet Transportation Needs (03/08/2021)   PRAPARE - Hydrologist  (Medical): No    Lack of Transportation (Non-Medical): Yes  Physical Activity: Not on file  Stress: Not on file  Social Connections: Not on file  Intimate Partner Violence: Not on file     Review of Systems    General:  No chills, fever, night sweats or weight changes. Endorses fatigue. Cardiovascular:  No chest pain, dyspnea on exertion, edema, orthopnea, palpitations, paroxysmal nocturnal dyspnea. Dermatological: No rash, lesions/masses Respiratory: No cough, dyspnea Urologic: No hematuria, dysuria Musculoskeletal: Continues to endorse knee pain Abdominal:   No nausea, vomiting, diarrhea, bright red blood per rectum, melena, or hematemesis Neurologic:  No visual changes, wkns, changes in mental status. All other systems reviewed and are otherwise negative except as noted above.   Physical Exam    VS:  BP 109/74 (BP Location: Right Arm, Patient Position: Sitting, Cuff Size: Normal)   Pulse 71   Ht '5\' 6"'$  (1.676 m)   Wt 208 lb (94.3 kg)   LMP 11/21/2020   SpO2 98%   BMI 33.57 kg/m  , BMI Body mass index is 33.57 kg/m.     GEN: Well nourished, well developed, in no acute distress. HEENT: normal. Neck: Supple, no JVD, carotid bruits, or masses. Cardiac: RRR, no murmurs, rubs, or gallops. No clubbing, cyanosis, edema.  Radials 2+/PT 2+ and equal bilaterally.  Respiratory:  Respirations regular and unlabored, clear to auscultation bilaterally. GI: Soft, nontender, nondistended, BS + x 4. MS: no deformity or atrophy. Skin: warm and dry, no rash. Neuro:  Strength and sensation are intact. Psych: Normal affect.  Accessory Clinical Findings    ECG personally reviewed by me today- sinus rhythm rate 71, occasional unifocal PVC, left axis deviation,   Lab Results  Component Value Date   WBC 6.2 05/16/2022   HGB 14.0 05/16/2022   HCT 43.4 05/16/2022   MCV 88.2 05/16/2022   PLT 252 05/16/2022   Lab Results  Component Value Date   CREATININE 0.62 05/16/2022   BUN 16  05/16/2022   NA 139 05/16/2022   K 4.0 05/16/2022   CL 106 05/16/2022   CO2 25 05/16/2022   Lab Results  Component Value Date   ALT 17 12/13/2021   AST 16 12/13/2021   ALKPHOS 66 12/13/2021   BILITOT 0.3 12/13/2021   Lab Results  Component Value Date   CHOL 151 05/16/2022   HDL 49 05/16/2022   LDLCALC  87 05/16/2022   TRIG 73 05/16/2022   CHOLHDL 3.1 05/16/2022    Lab Results  Component Value Date   HGBA1C 6.5 (H) 12/11/2020    Assessment & Plan   1.  HFrEF, ischemic cardiomyopathy status post ICD placement most recent echo in 09/2021 with an LVEF of 45-50%.  She remains euvolemic on exam today.  She continues to be on GDMT of carvedilol, Jardiance, Entresto, and Aldactone.  Her ICD continues to be followed by EP.  She continues to weigh daily, limit her sodium intake, and continues to watch her fluid intake.  She is continued on her current dose of Entresto due to soft blood pressure.  2.  CAD status post CABG with 1 or 2 episodes of prickly type discomfort that was relieved with changing wardrobe.  She has not required the use of any nitro for her chest discomfort and upon description remains atypical.  Previous testing was negative.  She is continued on aspirin, statin, beta-blocker, and Nitrostat as needed.  Antianginal therapy had previously been held up due to soft blood pressures.  3.  Hypertension with blood pressure today 109/74.  She is continued on her current medication regimen today and requested refills.  She is also been encouraged to continue to monitor her pressure at home.  4.  Hyperlipidemia with last LDL of 87.  Reeducated on goal of less than 70.  She is continued on atorvastatin 80 mg daily.  Encouraged to make dietary changes and if need be can add Zetia to medication regimen to achieve goal of return appointment.  5.  SVT status post ablation without further recurrence.  6.  Cryptogenic stroke that has been stable.  She is continued on aspirin and statin.   She continues to be followed by neurology.  7.  Type 2 diabetes with last hemoglobin A1c of 6.5.  She has recently been started on Ozempic.  Education was provided to her from nursing staff today on how to self inject her medication and she stated.  Previously been explained to her.  8.  Disposition patient return to clinic to see MD/APP in 3 months or sooner if needed  Riaan Toledo, NP 09/30/2022, 4:02 PM

## 2022-09-30 NOTE — Patient Instructions (Signed)
Medication Instructions:  Your Physician recommend you continue on your current medication as directed.    *If you need a refill on your cardiac medications before your next appointment, please call your pharmacy*   Lab Work: None ordered today   Testing/Procedures: None ordered today   Follow-Up: At [facility], you and your health needs are our priority.  As part of our continuing mission to provide you with exceptional heart care, we have created designated Provider Care Teams.  These Care Teams include your primary Cardiologist (physician) and Advanced Practice Providers (APPs -  Physician Assistants and Nurse Practitioners) who all work together to provide you with the care you need, when you need it.  We recommend signing up for the patient portal called "MyChart".  Sign up information is provided on this After Visit Summary.  MyChart is used to connect with patients for Virtual Visits (Telemedicine).  Patients are able to view lab/test results, encounter notes, upcoming appointments, etc.  Non-urgent messages can be sent to your provider as well.   To learn more about what you can do with MyChart, go to [email or web address].    Your next appointment:   3 month(s)  Provider:   Gerrie Nordmann, NP

## 2022-10-05 ENCOUNTER — Other Ambulatory Visit: Payer: Self-pay | Admitting: Internal Medicine

## 2022-12-04 ENCOUNTER — Ambulatory Visit (INDEPENDENT_AMBULATORY_CARE_PROVIDER_SITE_OTHER): Payer: Medicaid Other

## 2022-12-04 DIAGNOSIS — I429 Cardiomyopathy, unspecified: Secondary | ICD-10-CM

## 2022-12-04 LAB — CUP PACEART REMOTE DEVICE CHECK
Date Time Interrogation Session: 20240327074921
Implantable Lead Connection Status: 753985
Implantable Lead Implant Date: 20220329
Implantable Lead Location: 753860
Implantable Lead Model: 436909
Implantable Lead Serial Number: 81437635
Implantable Pulse Generator Implant Date: 20220329
Pulse Gen Model: 429525
Pulse Gen Serial Number: 84824217

## 2022-12-25 ENCOUNTER — Encounter (INDEPENDENT_AMBULATORY_CARE_PROVIDER_SITE_OTHER): Payer: Self-pay | Admitting: Primary Care

## 2022-12-25 ENCOUNTER — Ambulatory Visit (INDEPENDENT_AMBULATORY_CARE_PROVIDER_SITE_OTHER): Payer: Medicaid Other | Admitting: Primary Care

## 2022-12-25 VITALS — BP 106/71 | HR 91 | Resp 16 | Ht 66.0 in | Wt 203.4 lb

## 2022-12-25 DIAGNOSIS — Z6832 Body mass index (BMI) 32.0-32.9, adult: Secondary | ICD-10-CM

## 2022-12-25 DIAGNOSIS — E119 Type 2 diabetes mellitus without complications: Secondary | ICD-10-CM

## 2022-12-25 DIAGNOSIS — E785 Hyperlipidemia, unspecified: Secondary | ICD-10-CM | POA: Diagnosis not present

## 2022-12-25 DIAGNOSIS — F32A Depression, unspecified: Secondary | ICD-10-CM

## 2022-12-25 DIAGNOSIS — L602 Onychogryphosis: Secondary | ICD-10-CM

## 2022-12-25 DIAGNOSIS — E279 Disorder of adrenal gland, unspecified: Secondary | ICD-10-CM | POA: Insufficient documentation

## 2022-12-25 DIAGNOSIS — Z1231 Encounter for screening mammogram for malignant neoplasm of breast: Secondary | ICD-10-CM

## 2022-12-25 DIAGNOSIS — Z1159 Encounter for screening for other viral diseases: Secondary | ICD-10-CM | POA: Diagnosis not present

## 2022-12-25 DIAGNOSIS — E278 Other specified disorders of adrenal gland: Secondary | ICD-10-CM | POA: Diagnosis not present

## 2022-12-25 DIAGNOSIS — E1169 Type 2 diabetes mellitus with other specified complication: Secondary | ICD-10-CM | POA: Diagnosis not present

## 2022-12-25 DIAGNOSIS — I5022 Chronic systolic (congestive) heart failure: Secondary | ICD-10-CM

## 2022-12-25 DIAGNOSIS — E669 Obesity, unspecified: Secondary | ICD-10-CM

## 2022-12-25 DIAGNOSIS — Z7689 Persons encountering health services in other specified circumstances: Secondary | ICD-10-CM

## 2022-12-25 LAB — POCT GLYCOSYLATED HEMOGLOBIN (HGB A1C): HbA1c, POC (controlled diabetic range): 6.6 % (ref 0.0–7.0)

## 2022-12-25 MED ORDER — ESCITALOPRAM OXALATE 20 MG PO TABS
20.0000 mg | ORAL_TABLET | Freq: Every day | ORAL | 1 refills | Status: DC
Start: 2022-12-25 — End: 2023-03-25

## 2022-12-25 MED ORDER — OZEMPIC (0.25 OR 0.5 MG/DOSE) 2 MG/3ML ~~LOC~~ SOPN
0.5000 mg | PEN_INJECTOR | SUBCUTANEOUS | 4 refills | Status: DC
Start: 2022-12-25 — End: 2023-03-25

## 2022-12-25 NOTE — Patient Instructions (Signed)

## 2022-12-25 NOTE — Progress Notes (Signed)
New Patient Office Visit  Subjective    Patient ID: Stacey Richardson, female    DOB: Dec 18, 1965  Age: 57 y.o. MRN: [id number]  CC:  Chief Complaint  Patient presents with   New Patient (Initial Visit)    HPI [name] obese presents to establish care. She has depression she is crying throughout the visit.  She denies suicidal ideations, auditory or visual hallucinations, or plans to hurt anyone else.  Denies polyuria, polydipsia, polyphasia or vision changes. Patient has No headache, No chest pain, No abdominal pain - No Nausea, No new weakness tingling or numbness, No Cough - shortness of breath    Outpatient Encounter Medications as of 12/25/2022  Medication Sig   acetaminophen (TYLENOL) 500 MG tablet Take 500 mg by mouth every 6 (six) hours as needed for moderate pain or headache.   alum & mag hydroxide-simeth (MAALOX ADVANCED MAX ST) 400-400-40 MG/5ML suspension Take 15 mLs by mouth every 6 (six) hours as needed for indigestion.   aspirin EC 81 MG tablet Take 81 mg by mouth daily.    atorvastatin (LIPITOR) 80 MG tablet Take 80 mg by mouth at bedtime.   carvedilol (COREG) 6.25 MG tablet TAKE 1 TABLET BY MOUTH TWICE A DAY   escitalopram (LEXAPRO) 20 MG tablet Take 20 mg by mouth daily.   JARDIANCE 10 MG TABS tablet Take 10 mg by mouth daily.   nitroGLYCERIN (NITROSTAT) 0.4 MG SL tablet Place 1 tablet (0.4 mg total) under the tongue every 5 (five) minutes as needed for chest pain.   OZEMPIC, 0.25 OR 0.5 MG/DOSE, 2 MG/3ML SOPN Inject 0.25 mg into the skin once a week.   sacubitril-valsartan (ENTRESTO) 24-26 MG Take 1 tablet by mouth 2 (two) times daily.   spironolactone (ALDACTONE) 25 MG tablet TAKE 1/2 TABLET BY MOUTH EVERY DAY   tetrahydrozoline 0.05 % ophthalmic solution Place 1-2 drops into both eyes daily as needed (irritated eyes.). visine   escitalopram (LEXAPRO) 10 MG tablet Take 10 mg by mouth daily. (Patient not taking: Reported on 12/25/2022)   meloxicam (MOBIC) 7.5  MG tablet Take 7.5 mg by mouth 3 (three) times daily. (Patient not taking: Reported on 05/10/2022)   No facility-administered encounter medications on file as of 12/25/2022.    Past Medical History:  Diagnosis Date   CAD (coronary artery disease)    CABG (07/2020; LIMA-LAD, RIMA-OM1)   CVA (cerebral vascular accident)    Diabetes mellitus    HFrEF (heart failure with reduced ejection fraction)    HLD (hyperlipidemia)    Hypertension    SVT (supraventricular tachycardia)    s/p ablation (11/2020)    Past Surgical History:  Procedure Laterality Date   CARDIAC CATHETERIZATION     CLIPPING OF ATRIAL APPENDAGE N/A 08/06/2020   Procedure: CLIPPING OF ATRIAL APPENDAGE USING ATRICURE CLIP SIZE ;  Surgeon: Linden Dolin, MD;  Location: [facility] OR;  Service: Open Heart Surgery;  Laterality: N/A;   COLONOSCOPY WITH PROPOFOL N/A 09/14/2021   Procedure: COLONOSCOPY WITH PROPOFOL;  Surgeon: Toney Reil, MD;  Location: [facility] ENDOSCOPY;  Service: Gastroenterology;  Laterality: N/A;   CORONARY ARTERY BYPASS GRAFT N/A 08/06/2020   Procedure: CORONARY ARTERY BYPASS GRAFTING (CABG) X2, USING BILATERAL INTERNAL MAMMARY ARTERIES;  Surgeon: Linden Dolin, MD;  Location: [facility] OR;  Service: Open Heart Surgery;  Laterality: N/A;   ICD IMPLANT N/A 12/05/2020   Procedure: ICD IMPLANT;  Surgeon: Lanier Prude, MD;  Location: [facility] INVASIVE CV LAB;  Service:  Cardiovascular;  Laterality: N/A;   KNEE SURGERY     RIGHT/LEFT HEART CATH AND CORONARY ANGIOGRAPHY Bilateral 08/02/2020   Procedure: RIGHT/LEFT HEART CATH AND CORONARY ANGIOGRAPHY;  Surgeon: Yvonne Kendall, MD;  Location: [facility] INVASIVE CV LAB;  Service: Cardiovascular;  Laterality: Bilateral;   SVT ABLATION N/A 12/05/2020   Procedure: SVT ABLATION;  Surgeon: Lanier Prude, MD;  Location: [facility] INVASIVE CV LAB;  Service: Cardiovascular;  Laterality: N/A;   TEE WITHOUT CARDIOVERSION N/A 07/18/2020   Procedure: TRANSESOPHAGEAL ECHOCARDIOGRAM (TEE);   Surgeon: Yvonne Kendall, MD;  Location: [facility] ORS;  Service: Cardiovascular;  Laterality: N/A;   TEE WITHOUT CARDIOVERSION N/A 08/06/2020   Procedure: TRANSESOPHAGEAL ECHOCARDIOGRAM (TEE);  Surgeon: Linden Dolin, MD;  Location: [facility] OR;  Service: Open Heart Surgery;  Laterality: N/A;    Family History  Problem Relation Age of Onset   Heart disease Mother        a. ?valve   Heart failure Brother    Hypertension Brother     Social History   Socioeconomic History   Marital status: Divorced    Spouse name: Not on file   Number of children: Not on file   Years of education: Not on file   Highest education level: Not on file  Occupational History   Occupation: on short term disability  Tobacco Use   Smoking status: Former   Smokeless tobacco: Never   Tobacco comments:    Quit 30 years ago  Psychologist, educational Use   Vaping Use: Never used  Substance and Sexual Activity   Alcohol use: Yes    Comment: social   Drug use: No   Sexual activity: Not on file  Other Topics Concern   Not on file  Social History Narrative   Lives with boyfriend   Right Handed   Drinks no caffeine daily   Social Determinants of Health   Financial Resource Strain: High Risk (03/08/2021)   Overall Financial Resource Strain (CARDIA)    Difficulty of Paying Living Expenses: Very hard  Food Insecurity: Food Insecurity Present (03/08/2021)   Hunger Vital Sign    Worried About Running Out of Food in the Last Year: Sometimes true    Ran Out of Food in the Last Year: Sometimes true  Transportation Needs: Unmet Transportation Needs (03/08/2021)   PRAPARE - Administrator, Civil Service (Medical): No    Lack of Transportation (Non-Medical): Yes  Physical Activity: Not on file  Stress: Not on file  Social Connections: Not on file  Intimate Partner Violence: Not on file    ROS Comprehensive ROS Pertinent positive and negative noted in HPI       Objective    Blood Pressure 106/71   Pulse 91    Respiration 16   Height  (1.676 m)   Weight 203 lb 6.4 oz (92.3 kg)   Last Menstrual Period 11/21/2020   Oxygen Saturation 100%   Body Mass Index 32.83 kg/m   Physical exam: General: Vital signs reviewed.  Patient is well-developed and well-nourished,obese  in no acute distress and cooperative with exam. Head: Normocephalic and atraumatic. Eyes: EOMI, conjunctivae normal, no scleral icterus. Neck: Supple, trachea midline, normal ROM, no JVD, masses, thyromegaly, or carotid bruit present. Cardiovascular: RRR, S1 normal, S2 normal, no murmurs, gallops, or rubs. Pulmonary/Chest: Clear to auscultation bilaterally, no wheezes, rales, or rhonchi. Abdominal: Soft, non-tender, non-distended, BS +, no masses, organomegaly, or guarding present. Musculoskeletal: No joint deformities, erythema, or stiffness, ROM full and nontender. Extremities:  No lower extremity edema bilaterally,  pulses symmetric and intact bilaterally. No cyanosis or clubbing. Neurological: A&O x3, Strength is normal Skin: Warm, dry and intact. No rashes or erythema. Psychiatric: Normal mood and affect. speech and behavior is normal. Cognition and memory are normal.    Assessment & Plan:   Stacey Richardson was seen today for new patient (initial visit).  Diagnoses and all orders for this visit:  Encounter to establish care  Type 2 diabetes mellitus without complication, long-term current use of insulin -     POCT glycosylated hemoglobin (Hb A1C) 6.6 - educated on lifestyle modifications, including but not limited to diet choices and adding exercise to daily routine.   -     Ambulatory referral to Ophthalmology -     Microalbumin / creatinine urine ratio -     CBC with Differential -     Ambulatory referral to Podiatry  Hyperlipidemia associated with type 2 diabetes mellitus HX of CVD/stroke -     Lipid Panel  Chronic HFrEF (heart failure with reduced ejection fraction) -     CMP14+EGFR followed by  cardiology.  Comprehensive diabetic foot examination, type 2 DM, encounter for completed  Onychauxis -     Ambulatory referral to Podiatry  Encounter for HCV screening test for low risk patient -     HCV Ab w Reflex to Quant PCR  Encounter for screening mammogram for malignant neoplasm of breast -     MM DIGITAL SCREENING BILATERAL; Fu     T Comprehensive diabetic foot examination, type 2 DM, encounter for Completed  Onychauxis -     Ambulatory referral to Podiatry  Encounter for HCV screening test for low risk patient -     HCV Ab w Reflex to Quant PCR  Encounter for screening mammogram for malignant neoplasm of breast -     MM DIGITAL SCREENING BILATERAL; Future  Depression, unspecified depression type Refer to clinical social worker -     escitalopram (LEXAPRO) 20 MG tablet; Take 1 tablet (20 mg total) by mouth daily. Return for pap.   Grayce Sessions, NP

## 2022-12-26 LAB — CMP14+EGFR

## 2022-12-26 LAB — LIPID PANEL

## 2022-12-27 LAB — MICROALBUMIN / CREATININE URINE RATIO
Creatinine, Urine: 118.8 mg/dL
Microalb/Creat Ratio: 3 mg/g creat (ref 0–29)
Microalbumin, Urine: 4.1 ug/mL

## 2022-12-27 LAB — CBC WITH DIFFERENTIAL/PLATELET
Basophils Absolute: 0.1 10*3/uL (ref 0.0–0.2)
Basos: 1 %
EOS (ABSOLUTE): 0.3 10*3/uL (ref 0.0–0.4)
Eos: 5 %
Hematocrit: 46.9 % — ABNORMAL HIGH (ref 34.0–46.6)
Hemoglobin: 15.3 g/dL (ref 11.1–15.9)
Immature Grans (Abs): 0.1 10*3/uL (ref 0.0–0.1)
Immature Granulocytes: 1 %
Lymphocytes Absolute: 1.6 10*3/uL (ref 0.7–3.1)
Lymphs: 26 %
MCH: 28.7 pg (ref 26.6–33.0)
MCHC: 32.6 g/dL (ref 31.5–35.7)
MCV: 88 fL (ref 79–97)
Monocytes Absolute: 0.5 10*3/uL (ref 0.1–0.9)
Monocytes: 8 %
Neutrophils Absolute: 3.6 10*3/uL (ref 1.4–7.0)
Neutrophils: 59 %
Platelets: 253 10*3/uL (ref 150–450)
RBC: 5.33 x10E6/uL — ABNORMAL HIGH (ref 3.77–5.28)
RDW: 12.6 % (ref 11.7–15.4)
WBC: 6 10*3/uL (ref 3.4–10.8)

## 2022-12-27 LAB — CMP14+EGFR
AST: 20 IU/L (ref 0–40)
Albumin/Globulin Ratio: 1.6 (ref 1.2–2.2)
Albumin: 4.6 g/dL (ref 3.8–4.9)
BUN/Creatinine Ratio: 16 (ref 9–23)
BUN: 12 mg/dL (ref 6–24)
Bilirubin Total: 0.5 mg/dL (ref 0.0–1.2)
CO2: 20 mmol/L (ref 20–29)
Globulin, Total: 2.9 g/dL (ref 1.5–4.5)
Glucose: 96 mg/dL (ref 70–99)
Potassium: 4.4 mmol/L (ref 3.5–5.2)
Sodium: 140 mmol/L (ref 134–144)
eGFR: 95 mL/min/{1.73_m2} (ref >59)

## 2022-12-27 LAB — LIPID PANEL
Chol/HDL Ratio: 3.1 ratio (ref 0.0–4.4)
Cholesterol, Total: 135 mg/dL (ref 100–199)
HDL: 44 mg/dL (ref >39)
Triglycerides: 73 mg/dL (ref 0–149)

## 2022-12-27 LAB — HCV AB W REFLEX TO QUANT PCR: HCV Ab: NONREACTIVE

## 2022-12-27 LAB — HCV INTERPRETATION

## 2022-12-30 ENCOUNTER — Ambulatory Visit: Payer: Medicaid Other | Admitting: Cardiology

## 2023-01-01 ENCOUNTER — Ambulatory Visit: Payer: Medicaid Other | Attending: Cardiology | Admitting: Cardiology

## 2023-01-01 ENCOUNTER — Encounter: Payer: Self-pay | Admitting: Cardiology

## 2023-01-01 VITALS — BP 96/62 | HR 83 | Ht 66.0 in | Wt 202.0 lb

## 2023-01-01 DIAGNOSIS — I10 Essential (primary) hypertension: Secondary | ICD-10-CM

## 2023-01-01 DIAGNOSIS — I471 Supraventricular tachycardia, unspecified: Secondary | ICD-10-CM

## 2023-01-01 DIAGNOSIS — I5020 Unspecified systolic (congestive) heart failure: Secondary | ICD-10-CM

## 2023-01-01 DIAGNOSIS — F32A Depression, unspecified: Secondary | ICD-10-CM

## 2023-01-01 DIAGNOSIS — I2510 Atherosclerotic heart disease of native coronary artery without angina pectoris: Secondary | ICD-10-CM | POA: Diagnosis not present

## 2023-01-01 DIAGNOSIS — E785 Hyperlipidemia, unspecified: Secondary | ICD-10-CM

## 2023-01-01 DIAGNOSIS — I255 Ischemic cardiomyopathy: Secondary | ICD-10-CM

## 2023-01-01 DIAGNOSIS — E119 Type 2 diabetes mellitus without complications: Secondary | ICD-10-CM

## 2023-01-01 DIAGNOSIS — E1169 Type 2 diabetes mellitus with other specified complication: Secondary | ICD-10-CM | POA: Diagnosis not present

## 2023-01-01 DIAGNOSIS — I639 Cerebral infarction, unspecified: Secondary | ICD-10-CM

## 2023-01-01 MED ORDER — CARVEDILOL 3.125 MG PO TABS: 3.1250 mg | ORAL_TABLET | Freq: Two times a day (BID) | ORAL | 1 refills | Status: DC

## 2023-01-01 NOTE — Progress Notes (Signed)
Cardiology Office Note:   Date:  01/01/2023  ID:  Stacey Richardson, DOB 1966/06/04, MRN [id number]  History of Present Illness:   FILICIA SCOGIN is a 57 y.o. female with a past medical history of stroke, HFrEF due to ICM, CAD status post CABG, hypertension, hyperlipidemia, type 2 diabetes, obesity, SVT, status post ablation, ICD insertion, who is here today to follow-up on coronary artery disease.  During hospitalization in 10/21 with acute speech difficulty and dysphagia she was diagnosed with a left frontal lobe infarct.  Echocardiogram at that time revealed LVEF of 35 to 40%.  CXR showed A-fib/a flutter with brief episodes of winky block and beta-blocker therapy was discontinued.  She returned back to the emergency room in 11/21 with MRI revealed small subacute appearing white matter infarct in the left corona.  TEE was performed to evaluate for possible PFO with no evidence of thrombus, EF 30-35%, negative bubble study, with notation of questionable small tunnel and intra atrial septum.  R/L CH was done 07/29/2020 which severe ostial LMCA stenosis with mild nonobstructive coronary artery disease in the LAD and left circumflex.  She was subsequently transferred to PET scan for CABG there was performed on 07/29/2020 with LIMA to LAD, RIMA to OM1, left atrial appendage clipping.  Repeat echocardiogram done 11/08/2020 showed LVEF of 30 to 35%, mild MR, and Lexiscan MPI 5/23 which revealed low risk, no significant ischemia and normal wall motion, EF estimated at 48%.  She was last seen in clinic 09/30/2022 at that time she had been doing well she has fleeting episodes of chest discomfort that resolved with wardrobe changes.  She denies any recent hospitalizations or visits to the emergency department.  There were no changes made to her medication regimen and no further testing that was ordered.  She returns to clinic today with concerns of fatigue and inability to focus.  She said that she feels like she is  walking around somnolent.  She states the symptoms started about the time they did increase her lexapro.  She has recently changed PCPs.  She denies any chest pain, shortness of breath, palpitations.  She does note abdominal bloating and difficulty losing weight even though she is on Ozempic.  She denies any hospitalizations or visits to the emergency department.  ROS: 10 point review of systems was been reviewed and considered negative with exception of what is listed in the HPI  Studies Reviewed:    EKG: No new tracings were completed today  Lexiscan Myoview 01/17/22 Exercise myocardial perfusion imaging study with no significant  ischemia Normal wall motion, EF estimated at 48% No EKG changes concerning for ischemia at peak stress or in recovery. Target heart rate achieved Exercise time 6 minutes, achieved 7 METS CT attenuation correction images with mild aortic atherosclerosis Low risk scan    TTE 10/01/21 1. Left ventricular ejection fraction, by estimation, is 45 to 50%. The  left ventricle has mildly decreased function. The left ventricle has no  regional wall motion abnormalities. Left ventricular diastolic parameters  are indeterminate.   2. Right ventricular systolic function is normal. The right ventricular  size is normal. RV pacer lead noted.   3. The mitral valve is normal in structure. Mild mitral valve  regurgitation. No evidence of mitral stenosis.   4. The aortic valve is normal in structure. Aortic valve regurgitation is  not visualized. No aortic stenosis is present.   5. The inferior vena cava is normal in size with greater than 50%  respiratory variability, suggesting right atrial pressure of 3 mmHg.   Risk Assessment/Calculations:                  Physical Exam:   VS:  BP 96/62   Pulse 83   Ht  (1.676 m)   Wt 202 lb (91.6 kg)   LMP 11/21/2020   SpO2 97%   BMI 32.60 kg/m    Wt Readings from Last 3 Encounters:  01/01/23 202 lb (91.6 kg)   12/25/22 203 lb 6.4 oz (92.3 kg)  09/30/22 208 lb (94.3 kg)     GEN: Well nourished, well developed in no acute distress NECK: No JVD; No carotid bruits CARDIAC: RRR, no murmurs, rubs, gallops RESPIRATORY:  Clear to auscultation without rales, wheezing or rhonchi  ABDOMEN: Soft, non-tender, non-distended EXTREMITIES:  No edema; No deformity   ASSESSMENT AND PLAN:   HFrEF, ischemic cardiomyopathy status post ICD placement with most recent echo 09/2021 with an LVEF of 45-50%.  She remains euvolemic on exam today.  She states that she does have some abdominal bloating.  She continues to be on GDMT carvedilol, Jardiance, Entresto, and Aldactone.  Her ascites continues to be followed by EP.  I decreased her carvedilol to 3.125 mg twice daily to see if this is the cause of some of the symptoms that she has been having worsening fatigue.  We are unable to continue to escalate Entresto dosing due to soft blood pressures.  Coronary artery disease status post CABG without angina.  She is continued on aspirin, and atorvastatin as well as she has Nitrostat that she has not had to use.  If she continued to have complaints of chest pain and can consider additional antianginal therapy if blood pressure allows.  Hypertension blood pressure today 96/62 which is slightly self-reported which usually runs.  She is continued on current medication regimen today with the exception of decreased her carvedilol from 6.25 mg twice daily to 3.125 mg twice daily.  Mixed hyperlipidemia with last LDL of 76 she has a goal of 70.  She is continued on atorvastatin 80 mg daily.  She is continue to make dietary changes we will add Zetia at her next appointment if she continues to be above goal.  History of SVT status post ablation without further recurrence.  Cryptogenic stroke but has been stable.  She is continued on aspirin and statin and continues to be followed by neurology.  Type 2 diabetes continue Ozempic and  Jardiance.  Continues to be followed by PCP.  Depression with the patient is on Lexapro were just been increased to 20 mg daily.  She thinks that her symptoms that she suffered from today are likely Kolls been increased dose of Lexapro.  Will reach out to her new primary care provider about decreasing Lexapro dose.  Disposition patient return to clinic to see MD/APP in 6 to 8 weeks to evaluate symptoms        Signed, Drayke Grabel, NP

## 2023-01-01 NOTE — Patient Instructions (Signed)
Medication Instructions:  Your physician has recommended you make the following change in your medication:   START - carvedilol (COREG) 3.125 MG tablet - Take 1 tablet (3.125 mg total) by mouth 2 (two) times daily.  *If you need a refill on your cardiac medications before your next appointment, please call your pharmacy*   Lab Work: -None ordered If you have labs (blood work) drawn today and your tests are completely normal, you will receive your results only by: MyChart Message (if you have MyChart) OR A paper copy in the mail If you have any lab test that is abnormal or we need to change your treatment, we will call you to review the results.   Testing/Procedures: -None ordered   Follow-Up: At [facility], you and your health needs are our priority.  As part of our continuing mission to provide you with exceptional heart care, we have created designated Provider Care Teams.  These Care Teams include your primary Cardiologist (physician) and Advanced Practice Providers (APPs -  Physician Assistants and Nurse Practitioners) who all work together to provide you with the care you need, when you need it.  We recommend signing up for the patient portal called "MyChart".  Sign up information is provided on this After Visit Summary.  MyChart is used to connect with patients for Virtual Visits (Telemedicine).  Patients are able to view lab/test results, encounter notes, upcoming appointments, etc.  Non-urgent messages can be sent to your provider as well.   To learn more about what you can do with MyChart, go to [email or web address].    Your next appointment:   6 - 8 week(s)  Provider:   You may see Yvonne Kendall, MD or one of the following Advanced Practice Providers on your designated Care Team:   Nicolasa Ducking, NP Eula Listen, PA-C Cadence Fransico Michael, PA-C Charlsie Quest, NP    Other Instructions -None

## 2023-01-03 ENCOUNTER — Ambulatory Visit (INDEPENDENT_AMBULATORY_CARE_PROVIDER_SITE_OTHER): Payer: Self-pay

## 2023-01-03 ENCOUNTER — Other Ambulatory Visit: Payer: Self-pay | Admitting: Internal Medicine

## 2023-01-03 NOTE — Telephone Encounter (Signed)
Chief Complaint: Dizziness Symptoms: Feels like head is heavy, in a fog, tired, sluggish Frequency: Ongoing, but worse since started on Lexapro 2 weeks ago Pertinent Negatives: Patient denies feeling like will pass out, other symptoms Disposition: [] ED /[] Urgent Care (no appt availability in office) / [] Appointment(In office/virtual)/ []  [facility]/ [] Home Care/ [x] Refused Recommended Disposition /[] [facility]/ []  Follow-up with PCP Additional Notes: Patient says the dizziness has been ongoing for a while and she's mentioned it to her doctor's, but since starting on Lexapro 20 mg 2 weeks ago all she wants to do is sleep and the dizziness has gotten worse. She says at times she feels like she will pass out, but not today. She says yesterday at her cardiology appointment her BP was 96/62 and she was dizzy with that. Her BP this morning wasn't that low and at 2 pm today BP 112/72. She says she's driving now, but she has to be cautious due to the dizziness. Advised no availability in the office until May 20th, she has upcoming on 01/30/23. Advised UC or ED over the weekend if symptoms get worse or if BP is low in the mornings SBP less than 100, advised not to take BP medication if SBP less than 100, advised to go to the mobile bus on Monday for evaluation, she says she will go on Monday. Advised to monitor/record BP readings in the morning sitting before taking medication. Advised to call cardiology office on call line if needed for low BP due to that provider is managing those medications. She verbalized understanding.   Reason for Disposition  [1] MILD dizziness (e.g., vertigo; walking normally) AND [2] has NOT been evaluated by doctor (or NP/PA) for this  Answer Assessment - Initial Assessment Questions 1. DESCRIPTION: "Describe your dizziness."     Feels like in a fog, very sluggish, fatigue 2. VERTIGO: "Do you feel like either you or the room is spinning or tilting?"       No 3. LIGHTHEADED: "Do you feel lightheaded?" (e.g., somewhat faint, woozy, weak upon standing)     Yes 4. SEVERITY: "How bad is it?"  "Can you walk?"   - MILD: Feels slightly dizzy and unsteady, but is walking normally.   - MODERATE: Feels unsteady when walking, but not falling; interferes with normal activities (e.g., school, work).   - SEVERE: Unable to walk without falling, or requires assistance to walk without falling.     Mild 5. ONSET:  "When did the dizziness begin?"      Ongoing, but it's getting worse since starting on Lexapro 20 mg 6. AGGRAVATING FACTORS: "Does anything make it worse?" (e.g., standing, change in head position)     Walking feels like head is heavy 7. CAUSE: "What do you think is causing the dizziness?"     Possible medication Lexapro increased dosage 8. RECURRENT SYMPTOM: "Have you had dizziness before?" If Yes, ask: "When was the last time?" "What happened that time?"     Yes, but got better on it's own 9. OTHER SYMPTOMS: "Do you have any other symptoms?" (e.g., headache, weakness, numbness, vomiting, earache)     No other symptoms  Protocols used: Dizziness - Vertigo-A-AH

## 2023-01-04 ENCOUNTER — Encounter: Payer: Self-pay | Admitting: Emergency Medicine

## 2023-01-04 ENCOUNTER — Ambulatory Visit
Admission: EM | Admit: 2023-01-04 | Discharge: 2023-01-04 | Disposition: A | Discharge status: Home / Self Care | Payer: Medicaid Other | Attending: Nurse Practitioner | Admitting: Nurse Practitioner

## 2023-01-04 DIAGNOSIS — Z8679 Personal history of other diseases of the circulatory system: Secondary | ICD-10-CM | POA: Diagnosis not present

## 2023-01-04 DIAGNOSIS — I959 Hypotension, unspecified: Secondary | ICD-10-CM

## 2023-01-04 NOTE — Discharge Instructions (Signed)
Continue to monitor your blood pressure as discussed. Recommend taking Lexapro, half tablet, until you can follow-up with behavioral health next week. Make sure you are drinking at least 48 ounces of water daily.  Avoid tea, soda, and coffee, as they contain caffeine. Make sure you are eating a healthy, well-balanced diet. As discussed, if you experience worsening lightheadedness, with dizziness, chest pain, shortness of breath, difficulty breathing, or swelling in your legs or feet, please go to the emergency department immediately. Continue to monitor your blood pressure at home.  Keep a log of your blood pressure.  Follow-up with your cardiologist if symptoms continue to persist. Follow-up as needed.

## 2023-01-04 NOTE — ED Triage Notes (Signed)
Was seen bu heart doctor on 4/24 and was told BP was low.  States she was told to follow up with ER or Urgent Care.  States one of her BP medications was decreased on 4/24.   States she does feel lightheaded at times.

## 2023-01-04 NOTE — ED Provider Notes (Addendum)
[facility]    CSN: [id number] Arrival date & time: 01/04/23  1402      History   Chief Complaint No chief complaint on file.   HPI Stacey Richardson is a 57 y.o. female.   The history is provided by the patient.   Patient presents for concerns regarding low blood pressure.  Patient states over the last 1 to 2 weeks, she has been experiencing lightheadedness.  She states that she has been experiencing low blood pressure.  She was seen at her cardiologist earlier in the week, and her blood pressure was low.  She states that when she checks it at home, she does not feel that her blood pressure cuff is reading appropriately.  She also states that her Lexapro was increased from 10 mg to 20 mg over the past 2 weeks, and she states that she has been more drowsy, with worsening dizziness.  She states that she takes the Lexapro at 4 PM in the evenings, but states that she remains drowsy throughout the next day.  The patient denies chest pain, shortness of breath, difficulty breathing, and lower extremity edema. History of heart disease, heart failure, and CVA.  She states that she did have her Coreg decreased on 12/25/2022 at her cardiologist appointment.  Patient states that she believes she can drink approximately 48 ounces of water per day, but is not sure if she is drinking that much.  She also states that she was concerned that she may also be going through menopause.  Patient states that she is just nervous about her blood pressure being low.  She states that she did drive over here, and felt okay when she was driving today.  Past Medical History:  Diagnosis Date   CAD (coronary artery disease)    CABG (07/2020; LIMA-LAD, RIMA-OM1)   CVA (cerebral vascular accident) (HCC)    Diabetes mellitus    HFrEF (heart failure with reduced ejection fraction) (HCC)    HLD (hyperlipidemia)    Hypertension    SVT (supraventricular tachycardia)    s/p ablation (11/2020)    Patient Active  Problem List   Diagnosis Date Noted   Adrenal nodule (HCC) 12/25/2022   Colon cancer screening    Ulcer of cecum    Ischemic cardiomyopathy 10/11/2020   Hyperlipidemia associated with type 2 diabetes mellitus (HCC) 10/11/2020   CAD in native artery 08/02/2020   Chronic HFrEF (heart failure with reduced ejection fraction) (HCC) 07/26/2020   Lightheadedness 07/26/2020   History of stroke 07/05/2020   HFrEF (heart failure with reduced ejection fraction) (HCC) 06/20/2020   Cardiomyopathy (HCC) 06/20/2020   Hyperlipidemia LDL goal <70 06/20/2020   Hyperglycemia    Cryptogenic stroke (HCC)    TIA (transient ischemic attack) 06/12/2020   Multifocal pneumonia 01/22/2019   Type 2 diabetes mellitus without complication (HCC)    Essential hypertension     Past Surgical History:  Procedure Laterality Date   CARDIAC CATHETERIZATION     CLIPPING OF ATRIAL APPENDAGE N/A 08/06/2020   Procedure: CLIPPING OF ATRIAL APPENDAGE USING ATRICURE CLIP SIZE ;  Surgeon: Linden Dolin, MD;  Location: [facility] OR;  Service: Open Heart Surgery;  Laterality: N/A;   COLONOSCOPY WITH PROPOFOL N/A 09/14/2021   Procedure: COLONOSCOPY WITH PROPOFOL;  Surgeon: Toney Reil, MD;  Location: [facility] ENDOSCOPY;  Service: Gastroenterology;  Laterality: N/A;   CORONARY ARTERY BYPASS GRAFT N/A 08/06/2020   Procedure: CORONARY ARTERY BYPASS GRAFTING (CABG) X2, USING BILATERAL INTERNAL MAMMARY ARTERIES;  Surgeon:  Linden Dolin, MD;  Location: [facility] OR;  Service: Open Heart Surgery;  Laterality: N/A;   ICD IMPLANT N/A 12/05/2020   Procedure: ICD IMPLANT;  Surgeon: Lanier Prude, MD;  Location: [facility] INVASIVE CV LAB;  Service: Cardiovascular;  Laterality: N/A;   KNEE SURGERY     RIGHT/LEFT HEART CATH AND CORONARY ANGIOGRAPHY Bilateral 08/02/2020   Procedure: RIGHT/LEFT HEART CATH AND CORONARY ANGIOGRAPHY;  Surgeon: Yvonne Kendall, MD;  Location: [facility] INVASIVE CV LAB;  Service: Cardiovascular;  Laterality: Bilateral;    SVT ABLATION N/A 12/05/2020   Procedure: SVT ABLATION;  Surgeon: Lanier Prude, MD;  Location: [facility] INVASIVE CV LAB;  Service: Cardiovascular;  Laterality: N/A;   TEE WITHOUT CARDIOVERSION N/A 07/18/2020   Procedure: TRANSESOPHAGEAL ECHOCARDIOGRAM (TEE);  Surgeon: Yvonne Kendall, MD;  Location: [facility] ORS;  Service: Cardiovascular;  Laterality: N/A;   TEE WITHOUT CARDIOVERSION N/A 08/06/2020   Procedure: TRANSESOPHAGEAL ECHOCARDIOGRAM (TEE);  Surgeon: Linden Dolin, MD;  Location: [facility] OR;  Service: Open Heart Surgery;  Laterality: N/A;    OB History   No obstetric history on file.      Home Medications    Prior to Admission medications   Medication Sig Start Date End Date Taking? Authorizing Provider  acetaminophen (TYLENOL) 500 MG tablet Take 500 mg by mouth every 6 (six) hours as needed for moderate pain or headache.    [provider]  alum & mag hydroxide-simeth (MAALOX ADVANCED MAX ST) 400-400-40 MG/5ML suspension Take 15 mLs by mouth every 6 (six) hours as needed for indigestion. 12/09/20   Mesner, Barbara Cower, MD  aspirin EC 81 MG tablet Take 81 mg by mouth daily.     [provider]  atorvastatin (LIPITOR) 80 MG tablet Take 80 mg by mouth at bedtime. 07/14/20   [provider]  carvedilol (COREG) 3.125 MG tablet Take 1 tablet (3.125 mg total) by mouth 2 (two) times daily. 01/01/23   Charlsie Quest, NP  escitalopram (LEXAPRO) 20 MG tablet Take 1 tablet (20 mg total) by mouth daily. 12/25/22   Grayce Sessions, NP  JARDIANCE 10 MG TABS tablet Take 10 mg by mouth daily. 04/05/21   [provider]  nitroGLYCERIN (NITROSTAT) 0.4 MG SL tablet Place 1 tablet (0.4 mg total) under the tongue every 5 (five) minutes as needed for chest pain. 01/08/22   Furth, Cadence H, PA-C  OZEMPIC, 0.25 OR 0.5 MG/DOSE, 2 MG/3ML SOPN Inject 0.5 mg into the skin once a week. 12/25/22   Grayce Sessions, NP  polyethylene glycol powder (GLYCOLAX/MIRALAX) 17 GM/SCOOP powder Take  17 g by mouth as needed for moderate constipation. 09/24/22   [provider]  sacubitril-valsartan (ENTRESTO) 24-26 MG Take 1 tablet by mouth 2 (two) times daily. 05/09/22   End, Cristal Deer, MD  spironolactone (ALDACTONE) 25 MG tablet TAKE 1/2 TABLET BY MOUTH EVERY DAY 06/10/22   End, Cristal Deer, MD  tetrahydrozoline 0.05 % ophthalmic solution Place 1-2 drops into both eyes daily as needed (irritated eyes.). visine    [provider]    Family History Family History  Problem Relation Age of Onset   Heart disease Mother        a. ?valve   Heart failure Brother    Hypertension Brother     Social History Social History   Tobacco Use   Smoking status: Former   Smokeless tobacco: Never   Tobacco comments:    Quit 30 years ago  Vaping Use   Vaping Use: Never used  Substance  Use Topics   Alcohol use: Yes    Comment: social   Drug use: No     Allergies   Lisinopril   Review of Systems Review of Systems Per HPI  Physical Exam Triage Vital Signs ED Triage Vitals  Enc Vitals Group     BP 01/04/23 1405 123/71     Pulse Rate 01/04/23 1405 85     Resp 01/04/23 1405 20     Temp 01/04/23 1405 98.3 F (36.8 C)     Temp Source 01/04/23 1405 Oral     SpO2 01/04/23 1405 95 %     Weight --      Height --      Head Circumference --      Peak Flow --      Pain Score 01/04/23 1408 0     Pain Loc --      Pain Edu? --      Excl. in GC? --    Orthostatic VS for the past 24 hrs:  BP- Lying Pulse- Lying BP- Sitting Pulse- Sitting BP- Standing at 0 minutes Pulse- Standing at 0 minutes  01/04/23 1409 102/67 75 109/69 77 108/68 90    Updated Vital Signs BP 105/67 (BP Location: Right Arm)   Pulse 73   Temp 98.3 F (36.8 C) (Oral)   Resp 20   LMP 11/21/2020   SpO2 95%   Visual Acuity Right Eye Distance:   Left Eye Distance:   Bilateral Distance:    Right Eye Near:   Left Eye Near:    Bilateral Near:     Physical Exam Vitals and nursing note  reviewed.  Constitutional:      General: She is not in acute distress.    Appearance: Normal appearance. She is well-developed.  HENT:     Head: Normocephalic.  Eyes:     Extraocular Movements: Extraocular movements intact.     Pupils: Pupils are equal, round, and reactive to light.  Cardiovascular:     Rate and Rhythm: Normal rate and regular rhythm.     Pulses: Normal pulses.     Heart sounds: Normal heart sounds.  Pulmonary:     Effort: Pulmonary effort is normal.     Breath sounds: Normal breath sounds.  Abdominal:     General: Bowel sounds are normal. There is no distension.     Palpations: Abdomen is soft.     Tenderness: There is no abdominal tenderness. There is no guarding or rebound.  Genitourinary:    Vagina: Normal. No vaginal discharge.  Musculoskeletal:     Cervical back: Normal range of motion.     Right lower leg: No edema.     Left lower leg: No edema.  Skin:    General: Skin is warm and dry.     Findings: No erythema or rash.  Neurological:     General: No focal deficit present.     Mental Status: She is alert and oriented to person, place, and time.  Psychiatric:        Mood and Affect: Mood normal.        Behavior: Behavior normal.      UC Treatments / Results  Labs (all labs ordered are listed, but only abnormal results are displayed) Labs Reviewed - No data to display  EKG   Radiology No results found.  Procedures Procedures (including critical care time)  Medications Ordered in UC Medications - No data to display  Initial Impression / Assessment and Plan /  UC Course  I have reviewed the triage vital signs and the nursing notes.  Pertinent labs & imaging results that were available during my care of the patient were reviewed by me and considered in my medical decision making (see chart for details).  The patient is in no acute distress, vital signs are stable at this time.  BP was rechecked at discharge and was 105/67, heart rate  73.  Neurological status was within normal limits, there was no obvious lower extremity edema, abnormal heart sounds, and vital signs were stable.  Discussion with patient that she should consider decreasing her Lexapro dose.  Patient states that she is open to taking half tablet until she can speak to behavioral health about the medication next week.  Patient was also advised to try to ensure that she is drinking at least 48 ounces of water to help prevent any dehydration, or additional causes of hypotension.  The patient was also given strict ER follow-up precautions.  Lengthy discussion with patient and she was reassured that she appears well at this time.  Patient was recommended to keep a log of her blood pressures, and to follow-up with cardiology with this information.  Patient was in agreement with this plan of care and verbalizes understanding.  All questions were answered.  Patient is stable for discharge.   Final Clinical Impressions(s) / UC Diagnoses   Final diagnoses:  Hypotension, unspecified hypotension type  History of heart failure     Discharge Instructions      Continue to monitor your blood pressure as discussed. Recommend taking Lexapro, half tablet, until you can follow-up with behavioral health next week. Make sure you are drinking at least 48 ounces of water daily.  Avoid tea, soda, and coffee, as they contain caffeine. Make sure you are eating a healthy, well-balanced diet. As discussed, if you experience worsening lightheadedness, with dizziness, chest pain, shortness of breath, difficulty breathing, or swelling in your legs or feet, please go to the emergency department immediately. Continue to monitor your blood pressure at home.  Keep a log of your blood pressure.  Follow-up with your cardiologist if symptoms continue to persist. Follow-up as needed.      ED Prescriptions   None    PDMP not reviewed this encounter.   Abran Cantor,  NP 01/04/23 1519    Abran Cantor, NP 01/04/23 1519

## 2023-01-04 NOTE — Telephone Encounter (Signed)
Reached out to pt to see if she would like to schedule a virtual appt with Jomarie Longs for today pt didn't answer lvm Will forward to provider

## 2023-01-05 NOTE — Telephone Encounter (Signed)
noted 

## 2023-01-06 ENCOUNTER — Other Ambulatory Visit: Payer: Self-pay

## 2023-01-06 ENCOUNTER — Ambulatory Visit
Admission: EM | Admit: 2023-01-06 | Discharge: 2023-01-06 | Disposition: A | Discharge status: Home / Self Care | Payer: Medicaid Other | Attending: Physician Assistant | Admitting: Physician Assistant

## 2023-01-06 DIAGNOSIS — R55 Syncope and collapse: Secondary | ICD-10-CM | POA: Diagnosis not present

## 2023-01-06 LAB — POCT FASTING CBG KUC MANUAL ENTRY: POCT Glucose (KUC): 105 mg/dL — AB (ref 70–99)

## 2023-01-06 NOTE — ED Provider Notes (Addendum)
[facility]    CSN: [id number] Arrival date & time: 01/06/23  1912      History   Chief Complaint No chief complaint on file.   HPI Stacey Richardson is a 57 y.o. female.   Patient reports she felt like her blood pressure was low.  Patient reports she was standing at home and felt like she was going to pass out.  Patient reports she has been seeing her cardiologist and they recently decreased her carvedilol due to her blood pressure being low.  Patient reports she takes carvedilol and Entresto patient has a history of cardiomyopathy coronary artery disease and heart failure.  Patient reports that she has not had any chest pain she has not had any weakness.  Patient denies any numbness or tingling in any extremities.  Patient denies any weakness in extremities she has not had any facial weakness.  The history is provided by the patient. No language interpreter was used.    Past Medical History:  Diagnosis Date   CAD (coronary artery disease)    CABG (07/2020; LIMA-LAD, RIMA-OM1)   CVA (cerebral vascular accident) (HCC)    Diabetes mellitus    HFrEF (heart failure with reduced ejection fraction) (HCC)    HLD (hyperlipidemia)    Hypertension    SVT (supraventricular tachycardia)    s/p ablation (11/2020)    Patient Active Problem List   Diagnosis Date Noted   Adrenal nodule (HCC) 12/25/2022   Colon cancer screening    Ulcer of cecum    Ischemic cardiomyopathy 10/11/2020   Hyperlipidemia associated with type 2 diabetes mellitus (HCC) 10/11/2020   CAD in native artery 08/02/2020   Chronic HFrEF (heart failure with reduced ejection fraction) (HCC) 07/26/2020   Lightheadedness 07/26/2020   History of stroke 07/05/2020   HFrEF (heart failure with reduced ejection fraction) (HCC) 06/20/2020   Cardiomyopathy (HCC) 06/20/2020   Hyperlipidemia LDL goal <70 06/20/2020   Hyperglycemia    Cryptogenic stroke (HCC)    TIA (transient ischemic attack) 06/12/2020   Multifocal  pneumonia 01/22/2019   Type 2 diabetes mellitus without complication (HCC)    Essential hypertension     Past Surgical History:  Procedure Laterality Date   CARDIAC CATHETERIZATION     CLIPPING OF ATRIAL APPENDAGE N/A 08/06/2020   Procedure: CLIPPING OF ATRIAL APPENDAGE USING ATRICURE CLIP SIZE ;  Surgeon: Linden Dolin, MD;  Location: [facility] OR;  Service: Open Heart Surgery;  Laterality: N/A;   COLONOSCOPY WITH PROPOFOL N/A 09/14/2021   Procedure: COLONOSCOPY WITH PROPOFOL;  Surgeon: Toney Reil, MD;  Location: [facility] ENDOSCOPY;  Service: Gastroenterology;  Laterality: N/A;   CORONARY ARTERY BYPASS GRAFT N/A 08/06/2020   Procedure: CORONARY ARTERY BYPASS GRAFTING (CABG) X2, USING BILATERAL INTERNAL MAMMARY ARTERIES;  Surgeon: Linden Dolin, MD;  Location: [facility] OR;  Service: Open Heart Surgery;  Laterality: N/A;   ICD IMPLANT N/A 12/05/2020   Procedure: ICD IMPLANT;  Surgeon: Lanier Prude, MD;  Location: [facility] INVASIVE CV LAB;  Service: Cardiovascular;  Laterality: N/A;   KNEE SURGERY     RIGHT/LEFT HEART CATH AND CORONARY ANGIOGRAPHY Bilateral 08/02/2020   Procedure: RIGHT/LEFT HEART CATH AND CORONARY ANGIOGRAPHY;  Surgeon: Yvonne Kendall, MD;  Location: [facility] INVASIVE CV LAB;  Service: Cardiovascular;  Laterality: Bilateral;   SVT ABLATION N/A 12/05/2020   Procedure: SVT ABLATION;  Surgeon: Lanier Prude, MD;  Location: [facility] INVASIVE CV LAB;  Service: Cardiovascular;  Laterality: N/A;   TEE WITHOUT CARDIOVERSION N/A 07/18/2020  Procedure: TRANSESOPHAGEAL ECHOCARDIOGRAM (TEE);  Surgeon: Yvonne Kendall, MD;  Location: [facility] ORS;  Service: Cardiovascular;  Laterality: N/A;   TEE WITHOUT CARDIOVERSION N/A 08/06/2020   Procedure: TRANSESOPHAGEAL ECHOCARDIOGRAM (TEE);  Surgeon: Linden Dolin, MD;  Location: [facility] OR;  Service: Open Heart Surgery;  Laterality: N/A;    OB History   No obstetric history on file.      Home Medications    Prior to Admission medications    Medication Sig Start Date End Date Taking? Authorizing Provider  acetaminophen (TYLENOL) 500 MG tablet Take 500 mg by mouth every 6 (six) hours as needed for moderate pain or headache.    [provider]  alum & mag hydroxide-simeth (MAALOX ADVANCED MAX ST) 400-400-40 MG/5ML suspension Take 15 mLs by mouth every 6 (six) hours as needed for indigestion. 12/09/20   Mesner, Barbara Cower, MD  aspirin EC 81 MG tablet Take 81 mg by mouth daily.     [provider]  atorvastatin (LIPITOR) 80 MG tablet Take 80 mg by mouth at bedtime. 07/14/20   [provider]  carvedilol (COREG) 3.125 MG tablet Take 1 tablet (3.125 mg total) by mouth 2 (two) times daily. 01/01/23   Charlsie Quest, NP  escitalopram (LEXAPRO) 20 MG tablet Take 1 tablet (20 mg total) by mouth daily. 12/25/22   Grayce Sessions, NP  JARDIANCE 10 MG TABS tablet Take 10 mg by mouth daily. 04/05/21   [provider]  nitroGLYCERIN (NITROSTAT) 0.4 MG SL tablet Place 1 tablet (0.4 mg total) under the tongue every 5 (five) minutes as needed for chest pain. 01/08/22   Furth, Cadence H, PA-C  OZEMPIC, 0.25 OR 0.5 MG/DOSE, 2 MG/3ML SOPN Inject 0.5 mg into the skin once a week. 12/25/22   Grayce Sessions, NP  polyethylene glycol powder (GLYCOLAX/MIRALAX) 17 GM/SCOOP powder Take 17 g by mouth as needed for moderate constipation. 09/24/22   [provider]  sacubitril-valsartan (ENTRESTO) 24-26 MG Take 1 tablet by mouth 2 (two) times daily. 05/09/22   End, Cristal Deer, MD  spironolactone (ALDACTONE) 25 MG tablet TAKE 1/2 TABLET BY MOUTH EVERY DAY 06/10/22   End, Cristal Deer, MD  tetrahydrozoline 0.05 % ophthalmic solution Place 1-2 drops into both eyes daily as needed (irritated eyes.). visine    [provider]    Family History Family History  Problem Relation Age of Onset   Heart disease Mother        a. ?valve   Heart failure Brother    Hypertension Brother     Social History Social History    Tobacco Use   Smoking status: Former   Smokeless tobacco: Never   Tobacco comments:    Quit 30 years ago  Vaping Use   Vaping Use: Never used  Substance Use Topics   Alcohol use: Yes    Comment: social   Drug use: No     Allergies   Lisinopril   Review of Systems Review of Systems  Respiratory:  Negative for shortness of breath.   Cardiovascular:  Negative for chest pain.  All other systems reviewed and are negative.    Physical Exam Triage Vital Signs ED Triage Vitals  Enc Vitals Group     BP 01/06/23 1947 117/69     Pulse Rate 01/06/23 1947 70     Resp 01/06/23 1947 20     Temp 01/06/23 1947 98.1 F (36.7 C)     Temp Source 01/06/23 1947 Oral     SpO2 01/06/23 1947 97 %  Weight --      Height --      Head Circumference --      Peak Flow --      Pain Score 01/06/23 1948 0     Pain Loc --      Pain Edu? --      Excl. in GC? --    No data found.  Updated Vital Signs BP 117/69 (BP Location: Right Arm)   Pulse 70   Temp 98.1 F (36.7 C) (Oral)   Resp 20   LMP 11/21/2020   SpO2 97%   Visual Acuity Right Eye Distance:   Left Eye Distance:   Bilateral Distance:    Right Eye Near:   Left Eye Near:    Bilateral Near:     Physical Exam Vitals and nursing note reviewed.  Constitutional:      Appearance: She is well-developed.  HENT:     Head: Normocephalic.  Pulmonary:     Effort: Pulmonary effort is normal.  Abdominal:     General: There is no distension.  Musculoskeletal:        General: Normal range of motion.     Cervical back: Normal range of motion.  Neurological:     Mental Status: She is alert and oriented to person, place, and time.      UC Treatments / Results  Labs (all labs ordered are listed, but only abnormal results are displayed) Labs Reviewed  POCT FASTING CBG [facility] MANUAL ENTRY    EKG  EKG shows normal sinus rhythm.  Acute MI Radiology No results found.  Procedures Procedures (including critical care  time)  Medications Ordered in UC Medications - No data to display  Initial Impression / Assessment and Plan / UC Course  I have reviewed the triage vital signs and the nursing notes.  Pertinent labs & imaging results that were available during my care of the patient were reviewed by me and considered in my medical decision making (see chart for details).     CBG is 105 Medical decision making: Patient appears anxious.  Patient seems to have had some type of event which scared her.  Patient has a significant cardiac history including congestive heart failure, coronary artery disease.  Patient does have a defibrillator.  I have advised patient I feel like she should go to the emergency department for further assessment. Pt agrees to go to Ed now Final Clinical Impressions(s) / UC Diagnoses   Final diagnoses:  Near syncope   Discharge Instructions   None    ED Prescriptions   None    PDMP not reviewed this encounter.   Elson Areas, PA-C 01/06/23 2025    Elson Areas, [place] 01/06/23 2026

## 2023-01-06 NOTE — Discharge Instructions (Signed)
Go to the Emergency department for further evaluation.

## 2023-01-06 NOTE — ED Notes (Signed)
Patient is being discharged from the Urgent Care and sent to the Emergency Department via PMV . Per provider Colin Broach. Patient is in need of higher level of care due to dizziness, weakness hx of heart CHF, EF 30. Patient is aware and verbalizes understanding of plan of care.  Vitals:   01/06/23 1947  BP: 117/69  Pulse: 70  Resp: 20  Temp: 98.1 F (36.7 C)  SpO2: 97%

## 2023-01-06 NOTE — ED Triage Notes (Signed)
Pt reports she had another light headed spell and almost passed out earlier today. Pt states this has been an ongoing problem for her and just recently her pcp has lessen her carvedilol dosage.   Pt states she feels really sleepy.

## 2023-01-07 ENCOUNTER — Ambulatory Visit: Payer: Medicaid Other | Admitting: Podiatry

## 2023-01-07 ENCOUNTER — Ambulatory Visit (INDEPENDENT_AMBULATORY_CARE_PROVIDER_SITE_OTHER): Payer: Medicaid Other

## 2023-01-07 ENCOUNTER — Other Ambulatory Visit: Payer: Self-pay | Admitting: Podiatry

## 2023-01-07 ENCOUNTER — Ambulatory Visit: Payer: Medicaid Other

## 2023-01-07 DIAGNOSIS — M2041 Other hammer toe(s) (acquired), right foot: Secondary | ICD-10-CM

## 2023-01-07 DIAGNOSIS — M21611 Bunion of right foot: Secondary | ICD-10-CM

## 2023-01-07 DIAGNOSIS — M21619 Bunion of unspecified foot: Secondary | ICD-10-CM

## 2023-01-07 NOTE — Progress Notes (Addendum)
Chief Complaint  Patient presents with   Bunions    Patient came in today for right foot bunion pain, started 2 weeks ago, burning when wearing shoes, rate of pain 6 out of 10, X-Rays done today    Diabetes    Diabetic foot care, A1c- 6.6 Bg- 105 nail trim     Subjective: 57 y.o. female presenting today with c/o painful bunion and 2nd hammertoe of right foot.  This has been getting worse over the past years.  Progressive onset.  She has tried different shoe gear modifications and arch supports with no relief of her symptoms.  It is now becoming symptomatic on a daily basis.  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  She lives with her boyfriend and has a daughter that lives nearby, who can assist her with driving and ADL's after surgery.  Denies foot trauma.    She is diabetic and denies numbness in feet.  Her A1c is 6.6%.  Referred here by PCP for diabetic foot check and evaluation for bunion.   Objective: Physical Exam General: The patient is alert and oriented x3 in no acute distress.  Dermatology: Skin is cool, dry and supple bilateral lower extremities. Negative for open lesions or macerations.  Nails are in good condition and of adequate length.  Vascular: Palpable pedal pulses bilaterally. No edema or erythema noted. Capillary refill within normal limits.  Neurological: Epicritic and protective threshold grossly intact bilaterally. Protective sensation intact bilateral toes.   Musculoskeletal Exam: Clinical evidence of bunion deformity noted to the right foot. There is moderate pain on palpation range of motion of the first MPJ. Lateral deviation of the hallux noted consistent with hallux abductovalgus. Hammertoe contracture also noted on clinical exam to digits right 2nd toe. Symptomatic pain on palpation and range of motion also noted to the metatarsal phalangeal  joints of the respective hammertoe digits.    Radiographic Exam Right foot 01/07/2023: Increased intermetatarsal angle greater than 15 with a hallux abductus angle greater than 30 noted on AP view. Contracture deformity also noted to the interphalangeal joints and MPJs of the digits of the right 2nd toe.   Assessment: 1. HAV w/ bunion deformity Right foot 2. Hammertoe deformity Right 2nd toe 3.  Type 2 diabetes - well controlled.   Plan of Care:  1. Patient was evaluated. X-Rays reviewed.  Patient has failed conservative management of the bunion and hammertoe deformity including wide fitting shoes with arch supports.  She states that she has tried different shoes but she continues to have pain and tenderness now on a daily basis.  Is very debilitating and affecting her quality of life on a daily basis 2. Discussed conservative and surgical options thoroughly with patient, including risks involved with undergoing outpatient surgical correction of the right foot.  Also discussed sequelae if patient opts not to have surgical correction, including worsening of deformities and increased pain. 3. Due to patient's level of pain with this progressively worsening deformity, will proceed with surgical scheduling at this time.  Discussed p/o course to include 6-8 weeks in surgical boot with minimal WB permitted.  Knee scooter ok to use.  She was informed she cannot drive.   4. Since HbA1c is under 7%, she is stable from a diabetic perspective. 5. Discussed stoppage of ASA and Ozempic approximately one week prior to surgery. 6.  Authorization for surgery  was initiated today.  Surgery will consist of bunionectomy with osteotomy right.  Possible Akin osteotomy right.  PIPJ arthroplasty with MTP capsulotomy second right. 7.  Return to clinic 1 week postop  *Going to school for LPN.  Finishes January 2025  Felecia Shelling, DPM [facility]  Dr. Felecia Shelling, DPM    [address] [place], [place] [address]                Office [phone]  Fax [phone]

## 2023-01-08 ENCOUNTER — Ambulatory Visit (INDEPENDENT_AMBULATORY_CARE_PROVIDER_SITE_OTHER): Payer: Medicaid Other | Admitting: Primary Care

## 2023-01-09 ENCOUNTER — Ambulatory Visit: Payer: Medicaid Other | Admitting: Student

## 2023-01-09 NOTE — Progress Notes (Deleted)
  Electrophysiology Office Note:   Date:  01/09/2023  ID:  Stacey Richardson, DOB 08-Feb-1966, MRN [id number]  Primary Cardiologist: Yvonne Kendall, MD Electrophysiologist: Lanier Prude, MD   History of Present Illness:   Stacey Richardson is a 57 y.o. female with h/o stroke, HFrEF due to ICM, CAD status post CABG, hypertension, hyperlipidemia, type 2 diabetes, obesity, SVT, status post ablation, ICD insertion  seen today for routine electrophysiology followup.   Recently, pt has had increased episodes of lightheadedness. ***.  she denies chest pain, palpitations, dyspnea, PND, orthopnea, nausea, vomiting, dizziness, syncope, edema, weight gain, or early satiety.   Review of systems complete and found to be negative unless listed in HPI.   Device History: Biotronik Camera operator ICD implanted 2023 for primary prevention. CHF  Studies Reviewed:    ICD Interrogation-  reviewed in detail today,  See PACEART report.  EKG is not ordered today. EKG from 01/06/2023 reviewed which showed NSR at 71 bpm  {Select studies to display:26339}   Risk Assessment/Calculations:   {Does this patient have ATRIAL FIBRILLATION?[phone]} No BP recorded.  {Refresh Note OR Click here to enter BP  :1}***        Physical Exam:   VS:  LMP 11/21/2020    Wt Readings from Last 3 Encounters:  01/01/23 202 lb (91.6 kg)  12/25/22 203 lb 6.4 oz (92.3 kg)  09/30/22 208 lb (94.3 kg)     GEN: Well nourished, well developed in no acute distress NECK: No JVD; No carotid bruits CARDIAC: {EPRHYTHM:28826}, no murmurs, rubs, gallops RESPIRATORY:  Clear to auscultation without rales, wheezing or rhonchi  ABDOMEN: Soft, non-tender, non-distended EXTREMITIES:  No edema; No deformity   ASSESSMENT AND PLAN:    Chronic systolic dysfunction s/p Biotronik single chamber ICD  euvolemic today Stable on an appropriate medical regimen Normal ICD function See Pace Art report No changes today Update Echo.  EF 45-50%  09/2021, but with her recent increasing intolerance to GDMT, I worry it is again low.   CAD s/p CABG Denies s/s ischemia  HTN Has had low pressure with difficulty tolerating her medications.  Will refer to HF team.   {Click here to Review PMH, Prob List, Meds, Allergies, SHx, FHx  :1}   Disposition:   Follow up with {EPPROVIDERS:28135} {EPFOLLOW UP:28173}   Signed, Graciella Freer, PA-C

## 2023-01-13 NOTE — Progress Notes (Signed)
Remote ICD transmission.   

## 2023-01-15 ENCOUNTER — Telehealth (INDEPENDENT_AMBULATORY_CARE_PROVIDER_SITE_OTHER): Payer: Self-pay | Admitting: Licensed Clinical Social Worker

## 2023-01-15 NOTE — Telephone Encounter (Signed)
LCSWA called patient today to introduce herself and to assess patients' mental health needs. Patient did not answer the phone. LCSWA was able to leave a brief message with the patient asking them to return the call. Patient was referred by PCP for depression.  

## 2023-01-15 NOTE — Telephone Encounter (Signed)
Client returned call. She was able to schedule an appointment to 01/30/23 to address her depression.

## 2023-01-27 ENCOUNTER — Ambulatory Visit (INDEPENDENT_AMBULATORY_CARE_PROVIDER_SITE_OTHER): Payer: Medicaid Other | Admitting: Primary Care

## 2023-01-30 ENCOUNTER — Ambulatory Visit (INDEPENDENT_AMBULATORY_CARE_PROVIDER_SITE_OTHER): Payer: Medicaid Other | Admitting: Primary Care

## 2023-01-30 ENCOUNTER — Institutional Professional Consult (permissible substitution) (INDEPENDENT_AMBULATORY_CARE_PROVIDER_SITE_OTHER): Payer: Medicaid Other | Admitting: Licensed Clinical Social Worker

## 2023-01-30 ENCOUNTER — Telehealth (INDEPENDENT_AMBULATORY_CARE_PROVIDER_SITE_OTHER): Payer: Self-pay | Admitting: Licensed Clinical Social Worker

## 2023-01-30 ENCOUNTER — Telehealth (INDEPENDENT_AMBULATORY_CARE_PROVIDER_SITE_OTHER): Payer: Self-pay

## 2023-01-30 NOTE — Telephone Encounter (Signed)
Will forward to [facility]

## 2023-01-30 NOTE — Telephone Encounter (Signed)
Called pt today because she sent a message to cancel her appointment today. I called to reschedule but she did not get an answer. I left a voicemail.

## 2023-01-30 NOTE — Telephone Encounter (Signed)
Copied from CRM [id number]. Topic: Appointment Scheduling - Scheduling Inquiry for Clinic >> Jan 30, 2023  8:25 AM Clide Dales wrote: Patient called to reschedule appointment with Reginia Naas. Please follow up with patient.

## 2023-01-31 ENCOUNTER — Telehealth: Payer: Self-pay | Admitting: Primary Care

## 2023-01-31 NOTE — Telephone Encounter (Signed)
Copied from CRM [id number]. Topic: General - Other >> Jan 31, 2023  4:21 PM Turkey B wrote: Reason for CRM: pt returned callback to speak with [facility]. Please call back

## 2023-02-07 ENCOUNTER — Ambulatory Visit: Payer: Medicaid Other

## 2023-02-11 ENCOUNTER — Other Ambulatory Visit (HOSPITAL_COMMUNITY)
Admission: RE | Admit: 2023-02-11 | Discharge: 2023-02-11 | Disposition: A | Discharge status: Home / Self Care | Payer: Medicaid Other | Source: Ambulatory Visit | Attending: Primary Care | Admitting: Primary Care

## 2023-02-11 ENCOUNTER — Ambulatory Visit (INDEPENDENT_AMBULATORY_CARE_PROVIDER_SITE_OTHER): Payer: Medicaid Other | Admitting: Primary Care

## 2023-02-11 VITALS — BP 103/70 | HR 78 | Resp 16 | Wt 203.4 lb

## 2023-02-11 DIAGNOSIS — N6324 Unspecified lump in the left breast, lower inner quadrant: Secondary | ICD-10-CM

## 2023-02-11 DIAGNOSIS — Z124 Encounter for screening for malignant neoplasm of cervix: Secondary | ICD-10-CM | POA: Diagnosis present

## 2023-02-11 NOTE — Progress Notes (Signed)
  [facility]  WELL-WOMAN PHYSICAL & PAP Patient name: Stacey Richardson MRN [id number]  Date of birth: [birth year] Chief Complaint:   Gynecologic Exam  History of Present Illness:   Stacey Richardson is a 57 y.o. No obstetric history on file. female being seen today for a routine well-woman exam.   CCgyn The current method of family planning is condoms.  Patient's last menstrual period was 11/21/2020. Last pap unknown . Results were: abnormal HPV Last mammogram: 6/24. Family h/o breast cancer: Yes Last colonoscopy: 09/14/21. Results were: normal. Family h/o colorectal cancer: No  Review of Systems:    Denies any headaches, blurred vision, fatigue, shortness of breath, chest pain, abdominal pain, abnormal vaginal discharge/itching/odor/irritation, problems with periods, bowel movements, urination, or intercourse unless otherwise stated above.  Pertinent History Reviewed:  Last Menstrual Period 11/21/2020   Reviewed past medical,surgical, social and family history.  Reviewed problem list, medications and allergies.  Physical Assessment:  Blood Pressure 103/70   Pulse 78   Respiration 16   Weight 203 lb 6.4 oz (92.3 kg)   Last Menstrual Period 11/21/2020   Oxygen Saturation 97%   Body Mass Index 32.83 kg/m          Physical Examination:  General appearance - well appearing, and in no distress Mental status - alert, oriented to person, place, and time Psych:  She has a normal mood and affect Skin - warm and dry, normal color, no suspicious lesions noted Chest - effort normal, all lung fields clear to auscultation bilaterally Heart - normal rate and regular rhythm Neck:  midline trachea, no thyromegaly or nodules Breasts - breasts appear normal,  right no suspicious masses, left mass 5-6 o'clock non tender mobile no skin or nipple changes or axillary nodes Educated patient on proper self breast examination and had patient to demonstrate SBE. Abdomen - soft, nontender,  nondistended, no masses or organomegaly Pelvic-VULVA: normal appearing vulva with no masses, tenderness or lesions   VAGINA: normal appearing vagina with normal color and discharge, no lesions   CERVIX: normal appearing cervix without discharge or lesions, no CMT UTERUS: uterus is felt to be normal size, shape, consistency and nontender  ADNEXA: No adnexal masses or tenderness noted. Extremities:  No swelling or varicosities noted  No results found for this or any previous visit (from the past 24 hour(s)).   Assessment & Plan:  [name] was seen today for gynecologic exam.  Diagnoses and all orders for this visit:  Encounter for screening for cervical cancer -     Cervicovaginal ancillary only -     Cytology - PAP  Mammogram February 26, 2023 Colonoscopy  09/14/21   Follow-up:   This note has been created with Dragon speech Office manager. Any transcriptional errors are unintentional.   Grayce Sessions, NP 02/11/2023, 4:19 PM

## 2023-02-12 ENCOUNTER — Telehealth (INDEPENDENT_AMBULATORY_CARE_PROVIDER_SITE_OTHER): Payer: Self-pay | Admitting: Licensed Clinical Social Worker

## 2023-02-12 NOTE — Telephone Encounter (Signed)
Returned pt call to schedule an appointment. Pt didn't answer, left a voicemail.

## 2023-02-12 NOTE — Telephone Encounter (Signed)
Noted  

## 2023-02-12 NOTE — Telephone Encounter (Signed)
noted 

## 2023-02-13 ENCOUNTER — Other Ambulatory Visit: Payer: Self-pay | Admitting: Cardiology

## 2023-02-13 LAB — CERVICOVAGINAL ANCILLARY ONLY
Bacterial Vaginitis (gardnerella): POSITIVE — AB
Candida Glabrata: NEGATIVE
Candida Vaginitis: NEGATIVE
Chlamydia: NEGATIVE
Comment: NEGATIVE
Comment: NEGATIVE
Comment: NEGATIVE
Comment: NEGATIVE
Comment: NEGATIVE
Comment: NORMAL
Neisseria Gonorrhea: NEGATIVE
Trichomonas: NEGATIVE

## 2023-02-13 NOTE — Telephone Encounter (Signed)
Pt is calling back to speak with Toni Amend. Please call pt back, pt states she will make sure she answer the phone.

## 2023-02-14 ENCOUNTER — Other Ambulatory Visit (INDEPENDENT_AMBULATORY_CARE_PROVIDER_SITE_OTHER): Payer: Self-pay | Admitting: Primary Care

## 2023-02-14 DIAGNOSIS — B9689 Other specified bacterial agents as the cause of diseases classified elsewhere: Secondary | ICD-10-CM

## 2023-02-14 MED ORDER — METRONIDAZOLE 500 MG PO TABS
500.0000 mg | ORAL_TABLET | Freq: Two times a day (BID) | ORAL | 0 refills | Status: DC
Start: 2023-02-14 — End: 2023-11-23

## 2023-02-17 ENCOUNTER — Telehealth (INDEPENDENT_AMBULATORY_CARE_PROVIDER_SITE_OTHER): Payer: Self-pay | Admitting: Licensed Clinical Social Worker

## 2023-02-17 ENCOUNTER — Encounter (INDEPENDENT_AMBULATORY_CARE_PROVIDER_SITE_OTHER): Payer: Self-pay

## 2023-02-17 ENCOUNTER — Telehealth: Payer: Self-pay

## 2023-02-17 LAB — CYTOLOGY - PAP: Diagnosis: NEGATIVE

## 2023-02-17 NOTE — Telephone Encounter (Signed)
Called pt to schedule appt. Pt didn't answer. Sent pt a Wellsite geologist.

## 2023-02-17 NOTE — Telephone Encounter (Signed)
Spoke to Stacey Richardson and cancelled her surgery with Dr. Logan Bores on 03/06/2023. Her insurance company requires 3 months of treatment before they will approve surgery. She has a follow up appointment with Dr. Logan Bores on 04/14/2023.   Dr. Logan Bores, will you call her in something for the pain? She was on meloxicam before but it interfered with other medications.

## 2023-02-18 ENCOUNTER — Other Ambulatory Visit: Payer: Self-pay | Admitting: Podiatry

## 2023-02-18 MED ORDER — ACETAMINOPHEN 500 MG PO TABS: 500.0000 mg | ORAL_TABLET | Freq: Four times a day (QID) | ORAL | 0 refills | Status: AC | PRN

## 2023-02-18 MED ORDER — NAPROXEN 500 MG PO TABS
500.0000 mg | ORAL_TABLET | Freq: Two times a day (BID) | ORAL | 1 refills | Status: DC
Start: 2023-02-18 — End: 2023-03-25

## 2023-02-18 NOTE — Progress Notes (Signed)
As needed conservative treatment for bunion and foot pain.  Follow-up next scheduled appointment.  Felecia Shelling, DPM [facility]  Dr. Felecia Shelling, DPM    [address] [place], [place] [address]                Office [phone]  Fax [phone]

## 2023-02-19 ENCOUNTER — Ambulatory Visit: Payer: Medicaid Other | Admitting: Cardiology

## 2023-02-19 NOTE — Progress Notes (Deleted)
Cardiology Office Note:  .   Date:  02/19/2023  ID:  Stacey Richardson, DOB 12-26-1965, MRN [id number] PCP: Grayce Sessions, NP  [facility] Providers Cardiologist:  Yvonne Kendall, MD Electrophysiologist:  Lanier Prude, MD { Click to update primary MD,subspecialty MD or APP then REFRESH:1}   History of Present Illness: .   Stacey Richardson is a 57 y.o. female with history of stroke, HFrEF due to ICM, CAD status post CABG x 2 (LIMA-LAD, RIMA-OM1), hypertension, hyperlipidemia, type 2 diabetes, obesity, SVT status post ablation, ICD insertion (12/05/20) is here today for follow-up on her coronary artery disease and HFrEF.  She was last seen in clinic 09/30/2018.  Management doing fairly well.  She did explain the episodes of chest discomfort that was resolved with changing her report.  She thought that the discomfort was related to having clothes on that were too tight. She remains off of Entresto due to soft blood pressures and continue to follow-up with the EP for ICD checks.  She was presented to Urgent Care on 01/04/2023 and on 01/06/2023 with with concerns of complaints of low blood pressure.  She stated that over the last 1 to 2 weeks that she has been experiencing lightheadedness.  Lexapro was increased from 10 mg to 20 mg and she started with drowsiness and worsening dizziness.  Blood pressure to be 123/71, heart rate of 85, respirations of 20.  Patient was considered stable for discharge and was advised to consider decreasing her Lexapro dose..  On 12/2022 she presented to urgent care with complaints of feeling that her blood pressure was low.  Pressure was 117/69 with pulse of 70 respirations of 20 and septic 98.1.  Patient was advised to report to the emergency department for further assessment near syncope.    ROS: ***  Studies Reviewed: Marland Kitchen    EKG:  ***  Lexiscan Myoview 01/17/22 Exercise myocardial perfusion imaging study with no significant  ischemia Normal wall motion,  EF estimated at 48% No EKG changes concerning for ischemia at peak stress or in recovery. Target heart rate achieved Exercise time 6 minutes, achieved 7 METS CT attenuation correction images with mild aortic atherosclerosis Low risk scan     TTE 10/01/21 1. Left ventricular ejection fraction, by estimation, is 45 to 50%. The  left ventricle has mildly decreased function. The left ventricle has no  regional wall motion abnormalities. Left ventricular diastolic parameters  are indeterminate.   2. Right ventricular systolic function is normal. The right ventricular  size is normal. RV pacer lead noted.   3. The mitral valve is normal in structure. Mild mitral valve  regurgitation. No evidence of mitral stenosis.   4. The aortic valve is normal in structure. Aortic valve regurgitation is  not visualized. No aortic stenosis is present.   5. The inferior vena cava is normal in size with greater than 50%  respiratory variability, suggesting right atrial pressure of 3 mmHg.  Risk Assessment/Calculations:     No BP recorded.  {Refresh Note OR Click here to enter BP  :1}***       Physical Exam:   VS:  LMP 11/21/2020    Wt Readings from Last 3 Encounters:  02/11/23 203 lb 6.4 oz (92.3 kg)  01/01/23 202 lb (91.6 kg)  12/25/22 203 lb 6.4 oz (92.3 kg)    GEN: Well nourished, well developed in no acute distress NECK: No JVD; No carotid bruits CARDIAC: ***RRR, no murmurs, rubs, gallops RESPIRATORY:  Clear  to auscultation without rales, wheezing or rhonchi  ABDOMEN: Soft, non-tender, non-distended EXTREMITIES:  No edema; No deformity   ASSESSMENT AND PLAN: .   ***    {Are you ordering a CV Procedure (e.g. stress test, cath, DCCV, TEE, etc)?   Press F2        :[id number]}  Dispo: ***  Signed, Florena Kozma, NP

## 2023-02-22 NOTE — Progress Notes (Deleted)
Cardiology Office Note Date:  02/22/2023  Patient ID:  Stacey Richardson, Stacey Richardson [birth year], MRN [id number] PCP:  Grayce Sessions, NP  Cardiologist:  Dr. Okey Dupre Electrophysiologist: Dr. Lalla Brothers  ***refresh   Chief Complaint: *** annual visit  History of Present Illness: Stacey Richardson is a 57 y.o. female with history of CAD (CABG), HTN, HLD, ICM, chronic CHF (systolic), DM, obesity, SVT (AVNRT ablated), stroke (loop was explanted at time of her ICD implant)  She saw Dr. Lalla Brothers 12/19/21, doing well, no changes were made.  Following regularly with gen cards team. Last 01/01/23 with Lionel December, NP, c/o fatigue, coreg dose reduced in effort to reduce her fatigue, mentioning BP limits her GDMT  *** symptoms *** Any AF? (Hx of stroke) *** volume  Device information Biotronik single chamber ICD implanted 12/05/20   Past Medical History:  Diagnosis Date   CAD (coronary artery disease)    CABG (07/2020; LIMA-LAD, RIMA-OM1)   CVA (cerebral vascular accident) (HCC)    Diabetes mellitus    HFrEF (heart failure with reduced ejection fraction) (HCC)    HLD (hyperlipidemia)    Hypertension    SVT (supraventricular tachycardia)    s/p ablation (11/2020)    Past Surgical History:  Procedure Laterality Date   CARDIAC CATHETERIZATION     CLIPPING OF ATRIAL APPENDAGE N/A 08/06/2020   Procedure: CLIPPING OF ATRIAL APPENDAGE USING ATRICURE CLIP SIZE ;  Surgeon: Linden Dolin, MD;  Location: [facility] OR;  Service: Open Heart Surgery;  Laterality: N/A;   COLONOSCOPY WITH PROPOFOL N/A 09/14/2021   Procedure: COLONOSCOPY WITH PROPOFOL;  Surgeon: Toney Reil, MD;  Location: [facility] ENDOSCOPY;  Service: Gastroenterology;  Laterality: N/A;   CORONARY ARTERY BYPASS GRAFT N/A 08/06/2020   Procedure: CORONARY ARTERY BYPASS GRAFTING (CABG) X2, USING BILATERAL INTERNAL MAMMARY ARTERIES;  Surgeon: Linden Dolin, MD;  Location: [facility] OR;  Service: Open Heart Surgery;  Laterality: N/A;   ICD IMPLANT  N/A 12/05/2020   Procedure: ICD IMPLANT;  Surgeon: Lanier Prude, MD;  Location: [facility] INVASIVE CV LAB;  Service: Cardiovascular;  Laterality: N/A;   KNEE SURGERY     RIGHT/LEFT HEART CATH AND CORONARY ANGIOGRAPHY Bilateral 08/02/2020   Procedure: RIGHT/LEFT HEART CATH AND CORONARY ANGIOGRAPHY;  Surgeon: Yvonne Kendall, MD;  Location: [facility] INVASIVE CV LAB;  Service: Cardiovascular;  Laterality: Bilateral;   SVT ABLATION N/A 12/05/2020   Procedure: SVT ABLATION;  Surgeon: Lanier Prude, MD;  Location: [facility] INVASIVE CV LAB;  Service: Cardiovascular;  Laterality: N/A;   TEE WITHOUT CARDIOVERSION N/A 07/18/2020   Procedure: TRANSESOPHAGEAL ECHOCARDIOGRAM (TEE);  Surgeon: Yvonne Kendall, MD;  Location: [facility] ORS;  Service: Cardiovascular;  Laterality: N/A;   TEE WITHOUT CARDIOVERSION N/A 08/06/2020   Procedure: TRANSESOPHAGEAL ECHOCARDIOGRAM (TEE);  Surgeon: Linden Dolin, MD;  Location: [facility] OR;  Service: Open Heart Surgery;  Laterality: N/A;    Current Outpatient Medications  Medication Sig Dispense Refill   acetaminophen (TYLENOL) 500 MG tablet Take 1 tablet (500 mg total) by mouth every 6 (six) hours as needed. 30 tablet 0   metroNIDAZOLE (FLAGYL) 500 MG tablet Take 1 tablet (500 mg total) by mouth 2 (two) times daily. 14 tablet 0   naproxen (NAPROSYN) 500 MG tablet Take 1 tablet (500 mg total) by mouth 2 (two) times daily with a meal. 60 tablet 1   alum & mag hydroxide-simeth (MAALOX ADVANCED MAX ST) 400-400-40 MG/5ML suspension Take 15 mLs by mouth every 6 (six) hours as needed for indigestion. 355  mL 0   aspirin EC 81 MG tablet Take 81 mg by mouth daily.      atorvastatin (LIPITOR) 80 MG tablet Take 80 mg by mouth at bedtime.     carvedilol (COREG) 3.125 MG tablet TAKE 1 TABLET BY MOUTH 2 TIMES DAILY. 180 tablet 0   escitalopram (LEXAPRO) 20 MG tablet Take 1 tablet (20 mg total) by mouth daily. 90 tablet 1   JARDIANCE 10 MG TABS tablet Take 10 mg by mouth daily.     nitroGLYCERIN  (NITROSTAT) 0.4 MG SL tablet Place 1 tablet (0.4 mg total) under the tongue every 5 (five) minutes as needed for chest pain. 25 tablet 3   OZEMPIC, 0.25 OR 0.5 MG/DOSE, 2 MG/3ML SOPN Inject 0.5 mg into the skin once a week. 3 mL 4   polyethylene glycol powder (GLYCOLAX/MIRALAX) 17 GM/SCOOP powder Take 17 g by mouth as needed for moderate constipation.     sacubitril-valsartan (ENTRESTO) 24-26 MG Take 1 tablet by mouth 2 (two) times daily. 60 tablet 5   spironolactone (ALDACTONE) 25 MG tablet TAKE 1/2 TABLET BY MOUTH EVERY DAY 90 tablet 3   tetrahydrozoline 0.05 % ophthalmic solution Place 1-2 drops into both eyes daily as needed (irritated eyes.). visine     No current facility-administered medications for this visit.    Allergies:   Lisinopril   Social History:  The patient  reports that she has quit smoking. She has never used smokeless tobacco. She reports current alcohol use. She reports that she does not use drugs.   Family History:  The patient's family history includes Heart disease in her mother; Heart failure in her brother; Hypertension in her brother.  ROS:  Please see the history of present illness.    All other systems are reviewed and otherwise negative.   PHYSICAL EXAM:  VS:  LMP 11/21/2020  BMI: There is no height or weight on file to calculate BMI. Well nourished, well developed, in no acute distress HEENT: normocephalic, atraumatic Neck: no JVD, carotid bruits or masses Cardiac:  *** RRR; no significant murmurs, no rubs, or gallops Lungs:  *** CTA b/l, no wheezing, rhonchi or rales Abd: soft, nontender MS: no deformity or *** atrophy Ext: *** no edema Skin: warm and dry, no rash Neuro:  No gross deficits appreciated Psych: euthymic mood, full affect  *** ICD site is stable, no tethering or discomfort   EKG:  not done today  Device interrogation done today and reviewed by myself:  ***  01/17/22: Stress myoview Exercise myocardial perfusion imaging study with  no significant  ischemia Normal wall motion, EF estimated at 48% No EKG changes concerning for ischemia at peak stress or in recovery. Target heart rate achieved Exercise time 6 minutes, achieved 7 METS CT attenuation correction images with mild aortic atherosclerosis Low risk scan   10/01/21: TTE 1. Left ventricular ejection fraction, by estimation, is 45 to 50%. The  left ventricle has mildly decreased function. The left ventricle has no  regional wall motion abnormalities. Left ventricular diastolic parameters  are indeterminate.   2. Right ventricular systolic function is normal. The right ventricular  size is normal. RV pacer lead noted.   3. The mitral valve is normal in structure. Mild mitral valve  regurgitation. No evidence of mitral stenosis.   4. The aortic valve is normal in structure. Aortic valve regurgitation is  not visualized. No aortic stenosis is present.   5. The inferior vena cava is normal in size with greater than  50%  respiratory variability, suggesting right atrial pressure of 3 mmHg.   Comparison(s): EF 30-35%.   12/05/20: EPS, ablation/ICD implant (loop removal) CONCLUSIONS:  1. Sinus rhythm upon presentation.  2. Successful radiofrequency ablation of the slow pathway 3. No inducible sustained arrhythmias following ablation.  4. Successful implantation of a Biotronik VDI ICD  5. Successful removal of a Med[employer]onic loop recorder  6. No early apparent complications.    Recent Labs: 12/25/2022: ALT 18; BUN 12; Creatinine, Ser 0.74; Hemoglobin 15.3; Platelets 253; Potassium 4.4; Sodium 140  05/16/2022: VLDL 15 12/25/2022: Chol/HDL Ratio 3.1; Cholesterol, Total 135; HDL 44; LDL Chol Calc (NIH) 76; Triglycerides 73   CrCl cannot be calculated (Patient's most recent lab result is older than the maximum 21 days allowed.).   Wt Readings from Last 3 Encounters:  02/11/23 203 lb 6.4 oz (92.3 kg)  01/01/23 202 lb (91.6 kg)  12/25/22 203 lb 6.4 oz (92.3 kg)      Other studies reviewed: Additional studies/records reviewed today include: summarized above  ASSESSMENT AND PLAN:  ICD ***  ICM Chronic CHF ***   CAD ***  Disposition: F/u with ***  Current medicines are reviewed at length with the patient today.  The patient did not have any concerns regarding medicines.  Stacey Fredrickson, PA-C 02/22/2023 2:25 PM     [facility] [address] [place] [address] [phone] (office)  [phone] (fax)

## 2023-02-25 ENCOUNTER — Encounter: Payer: Self-pay | Admitting: Physician Assistant

## 2023-02-25 ENCOUNTER — Ambulatory Visit: Payer: Medicaid Other | Attending: Student | Admitting: Physician Assistant

## 2023-02-26 ENCOUNTER — Ambulatory Visit
Admission: RE | Admit: 2023-02-26 | Discharge: 2023-02-26 | Disposition: A | Discharge status: Home / Self Care | Payer: Medicaid Other | Source: Ambulatory Visit | Attending: Primary Care | Admitting: Primary Care

## 2023-02-26 DIAGNOSIS — Z1231 Encounter for screening mammogram for malignant neoplasm of breast: Secondary | ICD-10-CM

## 2023-02-27 ENCOUNTER — Ambulatory Visit: Payer: Medicaid Other | Admitting: Cardiology

## 2023-02-27 ENCOUNTER — Institutional Professional Consult (permissible substitution) (INDEPENDENT_AMBULATORY_CARE_PROVIDER_SITE_OTHER): Payer: Medicaid Other | Admitting: Licensed Clinical Social Worker

## 2023-03-03 ENCOUNTER — Other Ambulatory Visit: Payer: Self-pay | Admitting: Primary Care

## 2023-03-03 DIAGNOSIS — R928 Other abnormal and inconclusive findings on diagnostic imaging of breast: Secondary | ICD-10-CM

## 2023-03-05 ENCOUNTER — Ambulatory Visit (INDEPENDENT_AMBULATORY_CARE_PROVIDER_SITE_OTHER): Payer: Medicaid Other

## 2023-03-05 DIAGNOSIS — I255 Ischemic cardiomyopathy: Secondary | ICD-10-CM | POA: Diagnosis not present

## 2023-03-11 ENCOUNTER — Other Ambulatory Visit: Payer: Medicaid Other

## 2023-03-11 LAB — CUP PACEART REMOTE DEVICE CHECK
Date Time Interrogation Session: 20240628130751
Implantable Lead Connection Status: 753985
Implantable Lead Implant Date: 20220329
Implantable Lead Location: 753860
Implantable Lead Model: 436909
Implantable Lead Serial Number: 81437635
Implantable Pulse Generator Implant Date: 20220329
Pulse Gen Model: 429525
Pulse Gen Serial Number: 84824217

## 2023-03-12 ENCOUNTER — Ambulatory Visit
Admission: RE | Admit: 2023-03-12 | Discharge: 2023-03-12 | Disposition: A | Discharge status: Home / Self Care | Payer: Medicaid Other | Source: Ambulatory Visit | Attending: Primary Care | Admitting: Primary Care

## 2023-03-12 ENCOUNTER — Encounter: Payer: Medicaid Other | Admitting: Podiatry

## 2023-03-12 DIAGNOSIS — R928 Other abnormal and inconclusive findings on diagnostic imaging of breast: Secondary | ICD-10-CM

## 2023-03-17 ENCOUNTER — Ambulatory Visit (INDEPENDENT_AMBULATORY_CARE_PROVIDER_SITE_OTHER): Payer: Medicaid Other | Admitting: Primary Care

## 2023-03-19 ENCOUNTER — Encounter: Payer: Medicaid Other | Admitting: Podiatry

## 2023-03-25 ENCOUNTER — Ambulatory Visit (INDEPENDENT_AMBULATORY_CARE_PROVIDER_SITE_OTHER): Payer: Medicaid Other | Admitting: Primary Care

## 2023-03-25 ENCOUNTER — Encounter (INDEPENDENT_AMBULATORY_CARE_PROVIDER_SITE_OTHER): Payer: Self-pay | Admitting: Primary Care

## 2023-03-25 ENCOUNTER — Other Ambulatory Visit (HOSPITAL_COMMUNITY): Payer: Self-pay

## 2023-03-25 VITALS — BP 120/77 | HR 79 | Resp 16 | Wt 199.2 lb

## 2023-03-25 DIAGNOSIS — G4700 Insomnia, unspecified: Secondary | ICD-10-CM

## 2023-03-25 DIAGNOSIS — F32A Depression, unspecified: Secondary | ICD-10-CM

## 2023-03-25 DIAGNOSIS — Z7985 Long-term (current) use of injectable non-insulin antidiabetic drugs: Secondary | ICD-10-CM

## 2023-03-25 DIAGNOSIS — E119 Type 2 diabetes mellitus without complications: Secondary | ICD-10-CM

## 2023-03-25 DIAGNOSIS — Z7984 Long term (current) use of oral hypoglycemic drugs: Secondary | ICD-10-CM | POA: Diagnosis not present

## 2023-03-25 MED ORDER — ESCITALOPRAM OXALATE 20 MG PO TABS
20.0000 mg | ORAL_TABLET | Freq: Every day | ORAL | 1 refills | Status: DC
Start: 2023-03-25 — End: 2023-11-04
  Filled 2023-03-25: qty 30, 30d supply, fill #0
  Filled 2023-03-25 – 2023-04-16 (×2): qty 90, 90d supply, fill #0
  Filled 2023-07-13: qty 30, 30d supply, fill #1
  Filled 2023-08-08 – 2023-08-12 (×3): qty 90, 90d supply, fill #1

## 2023-03-25 MED ORDER — JARDIANCE 10 MG PO TABS
10.0000 mg | ORAL_TABLET | Freq: Every day | ORAL | 3 refills | Status: DC
Start: 2023-03-25 — End: 2023-04-02
  Filled 2023-03-25 – 2023-04-01 (×3): qty 30, 30d supply, fill #0

## 2023-03-25 MED ORDER — CARVEDILOL 3.125 MG PO TABS
3.1250 mg | ORAL_TABLET | Freq: Two times a day (BID) | ORAL | 0 refills | Status: DC
Start: 2023-03-25 — End: 2023-07-03
  Filled 2023-03-25: qty 180, 90d supply, fill #0

## 2023-03-25 MED ORDER — OZEMPIC (0.25 OR 0.5 MG/DOSE) 2 MG/3ML ~~LOC~~ SOPN
0.5000 mg | PEN_INJECTOR | SUBCUTANEOUS | 4 refills | Status: DC
Start: 2023-03-25 — End: 2023-11-23
  Filled 2023-03-25 – 2023-05-02 (×8): qty 3, 28d supply, fill #0
  Filled 2023-05-20: qty 3, fill #0
  Filled 2023-05-29 – 2023-06-03 (×2): qty 3, 28d supply, fill #0
  Filled 2023-07-03 – 2023-07-13 (×2): qty 3, 28d supply, fill #1
  Filled 2023-08-08: qty 3, 28d supply, fill #2
  Filled 2023-09-05: qty 3, 28d supply, fill #3
  Filled 2023-10-03: qty 3, 28d supply, fill #4

## 2023-03-25 NOTE — Progress Notes (Signed)
[facility]  Stacey Richardson, is a 57 y.o. female  ZOX:[id number]  WJX:[id number]  DOB - [birth year]  Chief Complaint  Patient presents with   Anxiety   Depression       Subjective:   Stacey Richardson is a 57 y.o. female here today for a follow up anxiety and depression. She denies any suicidal ideations or harm to others no auditory or visual hallucinations.. Patient has No headache, No chest pain, No abdominal pain - No Nausea, No new weakness tingling or numbness, No Cough - shortness of breath  No problems updated.  Allergies  Allergen Reactions   Lisinopril Cough    Past Medical History:  Diagnosis Date   CAD (coronary artery disease)    CABG (07/2020; LIMA-LAD, RIMA-OM1)   CVA (cerebral vascular accident) (HCC)    Diabetes mellitus    HFrEF (heart failure with reduced ejection fraction) (HCC)    HLD (hyperlipidemia)    Hypertension    SVT (supraventricular tachycardia)    s/p ablation (11/2020)    Current Outpatient Medications on File Prior to Visit  Medication Sig Dispense Refill   acetaminophen (TYLENOL) 500 MG tablet Take 1 tablet (500 mg total) by mouth every 6 (six) hours as needed. 30 tablet 0   metroNIDAZOLE (FLAGYL) 500 MG tablet Take 1 tablet (500 mg total) by mouth 2 (two) times daily. (Patient not taking: Reported on 03/25/2023) 14 tablet 0   alum & mag hydroxide-simeth (MAALOX ADVANCED MAX ST) 400-400-40 MG/5ML suspension Take 15 mLs by mouth every 6 (six) hours as needed for indigestion. 355 mL 0   aspirin EC 81 MG tablet Take 81 mg by mouth daily.      atorvastatin (LIPITOR) 80 MG tablet Take 80 mg by mouth at bedtime.     nitroGLYCERIN (NITROSTAT) 0.4 MG SL tablet Place 1 tablet (0.4 mg total) under the tongue every 5 (five) minutes as needed for chest pain. 25 tablet 3   polyethylene glycol powder (GLYCOLAX/MIRALAX) 17 GM/SCOOP powder Take 17 g by mouth as needed for moderate constipation.     sacubitril-valsartan (ENTRESTO) 24-26 MG Take  1 tablet by mouth 2 (two) times daily. 60 tablet 5   spironolactone (ALDACTONE) 25 MG tablet TAKE 1/2 TABLET BY MOUTH EVERY DAY 90 tablet 3   tetrahydrozoline 0.05 % ophthalmic solution Place 1-2 drops into both eyes daily as needed (irritated eyes.). visine     No current facility-administered medications on file prior to visit.    Objective:   Vitals:   03/25/23 1525  BP: 120/77  Pulse: 79  Resp: 16  SpO2: 95%  Weight: 199 lb 3.2 oz (90.4 kg)    Comprehensive ROS Pertinent positive and negative noted in HPI   Exam General appearance : Awake, alert, not in any distress. Speech Clear. Not toxic looking HEENT: Atraumatic and Normocephalic, pupils equally reactive to light and accomodation Neck: Supple, no JVD. No cervical lymphadenopathy.  Chest: Good air entry bilaterally, no added sounds  CVS: S1 S2 regular, no murmurs.  Abdomen: Bowel sounds present, Non tender and not distended with no gaurding, rigidity or rebound. Extremities: B/L Lower Ext shows no edema, both legs are warm to touch Neurology: Awake alert, and oriented X 3, CN II-XII intact, Non focal Skin: No Rash  Data Review Lab Results  Component Value Date   HGBA1C 6.6 12/25/2022   HGBA1C 6.5 (H) 12/11/2020   HGBA1C 6.2 (H) 08/05/2020    Assessment & Plan   Hitomi was seen today for anxiety  and depression.  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 Refer to licensed clinical social worker  Type 2 diabetes mellitus without complication, long-term current use of insulin - educated on lifestyle modifications, including but not limited to diet choices and adding exercise to daily routine.   -     carvedilol (COREG) 3.125 MG tablet; Take 1 tablet (3.125 mg total) by mouth 2 (two) times daily. -     JARDIANCE 10 MG TABS tablet; Take 1 tablet (10 mg  total) by mouth daily. -     OZEMPIC, 0.25 OR 0.5 MG/DOSE, 2 MG/3ML SOPN; Inject 0.5 mg into the skin once a week.     Patient have been counseled extensively about nutrition and exercise. Other issues discussed during this visit include: low cholesterol diet, weight control and daily exercise, foot care, annual eye examinations at Ophthalmology, importance of adherence with medications and regular follow-up. We also discussed long term complications of uncontrolled diabetes and hypertension.   Return in about 3 months (around 06/25/2023).  The patient was given clear instructions to go to ER or return to medical center if symptoms don't improve, worsen or new problems develop. The patient verbalized understanding. The patient was told to call to get lab results if they haven't heard anything in the next week.   This note has been created with Education officer, environmental. Any transcriptional errors are unintentional.   Grayce Sessions, NP 03/29/2023, 11:05 AM

## 2023-03-26 ENCOUNTER — Other Ambulatory Visit (HOSPITAL_COMMUNITY): Payer: Self-pay

## 2023-03-26 NOTE — Progress Notes (Signed)
 Remote ICD transmission.   

## 2023-03-27 ENCOUNTER — Ambulatory Visit: Payer: Medicaid Other | Attending: Cardiology | Admitting: Cardiology

## 2023-03-27 ENCOUNTER — Encounter: Payer: Self-pay | Admitting: Cardiology

## 2023-03-29 ENCOUNTER — Encounter (INDEPENDENT_AMBULATORY_CARE_PROVIDER_SITE_OTHER): Payer: Self-pay | Admitting: Primary Care

## 2023-03-31 ENCOUNTER — Telehealth: Payer: Self-pay | Admitting: Licensed Clinical Social Worker

## 2023-03-31 ENCOUNTER — Telehealth (INDEPENDENT_AMBULATORY_CARE_PROVIDER_SITE_OTHER): Payer: Self-pay | Admitting: Licensed Clinical Social Worker

## 2023-03-31 NOTE — Telephone Encounter (Signed)
A user error has taken place: encounter opened in error, closed for administrative reasons.

## 2023-03-31 NOTE — Telephone Encounter (Signed)
Called pt to follow up about her request to meet with me. Left pt a message.

## 2023-03-31 NOTE — Telephone Encounter (Signed)
Called pt back to schedule an appt for therapy.

## 2023-04-02 ENCOUNTER — Ambulatory Visit: Payer: Medicaid Other | Attending: Cardiology | Admitting: Medical

## 2023-04-02 ENCOUNTER — Other Ambulatory Visit (HOSPITAL_COMMUNITY): Payer: Self-pay

## 2023-04-02 ENCOUNTER — Encounter: Payer: Self-pay | Admitting: Medical

## 2023-04-02 ENCOUNTER — Encounter: Payer: Medicaid Other | Admitting: Podiatry

## 2023-04-02 VITALS — BP 114/60 | HR 75 | Ht 66.0 in | Wt 202.6 lb

## 2023-04-02 DIAGNOSIS — Z7984 Long term (current) use of oral hypoglycemic drugs: Secondary | ICD-10-CM

## 2023-04-02 DIAGNOSIS — Z95810 Presence of automatic (implantable) cardiac defibrillator: Secondary | ICD-10-CM | POA: Diagnosis not present

## 2023-04-02 DIAGNOSIS — I471 Supraventricular tachycardia, unspecified: Secondary | ICD-10-CM | POA: Diagnosis not present

## 2023-04-02 DIAGNOSIS — I2510 Atherosclerotic heart disease of native coronary artery without angina pectoris: Secondary | ICD-10-CM | POA: Diagnosis not present

## 2023-04-02 DIAGNOSIS — E119 Type 2 diabetes mellitus without complications: Secondary | ICD-10-CM

## 2023-04-02 DIAGNOSIS — I5022 Chronic systolic (congestive) heart failure: Secondary | ICD-10-CM | POA: Diagnosis not present

## 2023-04-02 DIAGNOSIS — I639 Cerebral infarction, unspecified: Secondary | ICD-10-CM

## 2023-04-02 DIAGNOSIS — E782 Mixed hyperlipidemia: Secondary | ICD-10-CM

## 2023-04-02 DIAGNOSIS — I10 Essential (primary) hypertension: Secondary | ICD-10-CM

## 2023-04-02 MED ORDER — EZETIMIBE 10 MG PO TABS
10.0000 mg | ORAL_TABLET | Freq: Every day | ORAL | 3 refills | Status: DC
  Filled 2023-04-02: qty 90, 90d supply, fill #0
  Filled 2023-06-15: qty 90, 90d supply, fill #1
  Filled 2023-10-02: qty 90, 90d supply, fill #2
  Filled 2023-12-30: qty 90, 90d supply, fill #3

## 2023-04-02 MED ORDER — JARDIANCE 10 MG PO TABS
10.0000 mg | ORAL_TABLET | Freq: Every day | ORAL | 3 refills | Status: DC
Start: 2023-04-02 — End: 2024-01-17
  Filled 2023-04-02 – 2023-04-23 (×6): qty 90, 90d supply, fill #0
  Filled 2023-07-13: qty 30, 30d supply, fill #1
  Filled 2023-08-16: qty 30, 30d supply, fill #2
  Filled 2023-09-15: qty 30, 30d supply, fill #3
  Filled 2023-10-12: qty 30, 30d supply, fill #4
  Filled 2023-11-11: qty 30, 30d supply, fill #5
  Filled 2023-12-09: qty 30, 30d supply, fill #6
  Filled 2024-01-07: qty 30, 30d supply, fill #7

## 2023-04-02 NOTE — Progress Notes (Unsigned)
Cardiology Office Note:    Date:  04/02/2023   ID:  Stacey Richardson, DOB [birth year], MRN [id number]  PCP:  Grayce Sessions, NP  [facility] Cardiologist:  Yvonne Kendall, MD  [facility] Electrophysiologist:  Lanier Prude, MD   Referring MD: Preston Fleeting*   Chief Complaint: 67-month follow-up  History of Present Illness:    Stacey Richardson is a 57 y.o. female with a hx of stroke, HFrEF due to ICM, CAD s/p CABG, HTN, HLD, DM2, obesity, SVT s/p ablation, ICD insertion who is here for follow-up.   The patient was hospitalized 06/12/20 with acute speech difficulty and dysphagia, diagnosed with left frontal lobe infarct. Echo showed reduced LVEF 35-40%. She was started on coreg, losartan, aspirin, plavix, rosuvastatin.    At follow-up 06/19/20 noted DOE and Zio showed afib/flutter, brief episodes of Wenckebach. Coreg was discontinued.    Ed visit 07/11/20 with MRI with small subacute appearing white matter infarct in left corona radiata new compared to previous, in same vicinity as prior ischemia as well as tiny area of cortical encephalomalacia corresponding to left operculum infarct last month. Reviewed by neurology with notation of subacute stroke recommended for outpatient follow up and to continue aspirin.    The patient underwent TEE 07/18/20 for evaluation of possible PFO with no evidence of thrombus, EF 30-35%, negative bubble study, though notation of question small tunnel in interatrial septum. She was recommended for [facility] to rule out ischemia as etiology of heart failure which was performed 08/02/20 showing severe ostial LMCA stenosis and mild nonobstructive coronary artery disease of LAD and LCx. She was transferred to [facility] for CABG which was performed 08/06/20 with LIMA-LAD, RIMA-OM1 and left atrial appendage clipping. For further evaluation of possible PFO transcranial doppler with bubbles was recommended and positive. On review by Dr. Okey Dupre and Dr. Excell Seltzer,  no indication for PFO closure as no clear evidence that PFO exists. At follow up with Dr. Okey Dupre 10/11/20 low dose Losartan as well as Coreg were initiated for optimization of heart failure therapies. She was seen by Dr. Lalla Brothers 10/18/20 and loop recorder place and discussion initiated regarding ICD for HFrEF as well as potential EP study due to history of SVT. She had an updated echo 11/08/20 LVEF 30-35%, LV global longitudinal strain -12.2%, mild MR.   The patient was seen 01/10/21  by EP and was doing well, no shocks and no recurrent SVT.    Seen in the ED 07/18/21 for near syncope. ER work-up was unremarkable. Patient thought it was from hypotension, however BP was normal. Echo was repeated for near syncope. Echo showed LVEF 45-50%.   Patient was last seen in April 2024 reporting fatigue.  Coreg was decreased to 3.25 mg twice a day.   Today, the patient reports she is overall doing well. She has shortness of breath when walking on the treadmill for 15 minutes. She will do another 15 minutes later in the work-up. She denies chest pain, Lower leg edema, orthopnea or pnd. She needs a refill of Jardiance.   Past Medical History:  Diagnosis Date   CAD (coronary artery disease)    CABG (07/2020; LIMA-LAD, RIMA-OM1)   CVA (cerebral vascular accident) (HCC)    Diabetes mellitus    HFrEF (heart failure with reduced ejection fraction) (HCC)    HLD (hyperlipidemia)    Hypertension    SVT (supraventricular tachycardia)    s/p ablation (11/2020)    Past Surgical History:  Procedure Laterality Date  CARDIAC CATHETERIZATION     CLIPPING OF ATRIAL APPENDAGE N/A 08/06/2020   Procedure: CLIPPING OF ATRIAL APPENDAGE USING ATRICURE CLIP SIZE ;  Surgeon: Linden Dolin, MD;  Location: [facility] OR;  Service: Open Heart Surgery;  Laterality: N/A;   COLONOSCOPY WITH PROPOFOL N/A 09/14/2021   Procedure: COLONOSCOPY WITH PROPOFOL;  Surgeon: Toney Reil, MD;  Location: [facility] ENDOSCOPY;  Service: Gastroenterology;   Laterality: N/A;   CORONARY ARTERY BYPASS GRAFT N/A 08/06/2020   Procedure: CORONARY ARTERY BYPASS GRAFTING (CABG) X2, USING BILATERAL INTERNAL MAMMARY ARTERIES;  Surgeon: Linden Dolin, MD;  Location: [facility] OR;  Service: Open Heart Surgery;  Laterality: N/A;   ICD IMPLANT N/A 12/05/2020   Procedure: ICD IMPLANT;  Surgeon: Lanier Prude, MD;  Location: [facility] INVASIVE CV LAB;  Service: Cardiovascular;  Laterality: N/A;   KNEE SURGERY     RIGHT/LEFT HEART CATH AND CORONARY ANGIOGRAPHY Bilateral 08/02/2020   Procedure: RIGHT/LEFT HEART CATH AND CORONARY ANGIOGRAPHY;  Surgeon: Yvonne Kendall, MD;  Location: [facility] INVASIVE CV LAB;  Service: Cardiovascular;  Laterality: Bilateral;   SVT ABLATION N/A 12/05/2020   Procedure: SVT ABLATION;  Surgeon: Lanier Prude, MD;  Location: [facility] INVASIVE CV LAB;  Service: Cardiovascular;  Laterality: N/A;   TEE WITHOUT CARDIOVERSION N/A 07/18/2020   Procedure: TRANSESOPHAGEAL ECHOCARDIOGRAM (TEE);  Surgeon: Yvonne Kendall, MD;  Location: [facility] ORS;  Service: Cardiovascular;  Laterality: N/A;   TEE WITHOUT CARDIOVERSION N/A 08/06/2020   Procedure: TRANSESOPHAGEAL ECHOCARDIOGRAM (TEE);  Surgeon: Linden Dolin, MD;  Location: [facility] OR;  Service: Open Heart Surgery;  Laterality: N/A;    Current Medications: Current Meds  Medication Sig   acetaminophen (TYLENOL) 500 MG tablet Take 1 tablet (500 mg total) by mouth every 6 (six) hours as needed.   alum & mag hydroxide-simeth (MAALOX ADVANCED MAX ST) 400-400-40 MG/5ML suspension Take 15 mLs by mouth every 6 (six) hours as needed for indigestion.   aspirin EC 81 MG tablet Take 81 mg by mouth daily.    atorvastatin (LIPITOR) 80 MG tablet Take 80 mg by mouth at bedtime.   carvedilol (COREG) 3.125 MG tablet Take 1 tablet (3.125 mg total) by mouth 2 (two) times daily.   escitalopram (LEXAPRO) 20 MG tablet Take 1 tablet (20 mg total) by mouth daily.   ezetimibe (ZETIA) 10 MG tablet Take 1 tablet (10 mg total) by mouth  daily.   nitroGLYCERIN (NITROSTAT) 0.4 MG SL tablet Place 1 tablet (0.4 mg total) under the tongue every 5 (five) minutes as needed for chest pain.   OZEMPIC, 0.25 OR 0.5 MG/DOSE, 2 MG/3ML SOPN Inject 0.5 mg into the skin once a week.   polyethylene glycol powder (GLYCOLAX/MIRALAX) 17 GM/SCOOP powder Take 17 g by mouth as needed for moderate constipation.   sacubitril-valsartan (ENTRESTO) 24-26 MG Take 1 tablet by mouth 2 (two) times daily.   spironolactone (ALDACTONE) 25 MG tablet TAKE 1/2 TABLET BY MOUTH EVERY DAY   tetrahydrozoline 0.05 % ophthalmic solution Place 1-2 drops into both eyes daily as needed (irritated eyes.). visine   [DISCONTINUED] JARDIANCE 10 MG TABS tablet Take 1 tablet (10 mg total) by mouth daily.     Allergies:   Lisinopril   Social History   Socioeconomic History   Marital status: Divorced    Spouse name: Not on file   Number of children: Not on file   Years of education: Not on file   Highest education level: GED or equivalent  Occupational History   Occupation: on short term disability  Tobacco Use   Smoking status: Former   Smokeless tobacco: Never   Tobacco comments:    Quit 30 years ago  Vaping Use   Vaping status: Never Used  Substance and Sexual Activity   Alcohol use: Yes    Comment: social   Drug use: No   Sexual activity: Not on file  Other Topics Concern   Not on file  Social History Narrative   Lives with boyfriend   Right Handed   Drinks no caffeine daily   Social Determinants of Health   Financial Resource Strain: High Risk (02/11/2023)   Overall Financial Resource Strain (CARDIA)    Difficulty of Paying Living Expenses: Very hard  Food Insecurity: Food Insecurity Present (02/11/2023)   Hunger Vital Sign    Worried About Programme researcher, broadcasting/film/video in the Last Year: Often true    Ran Out of Food in the Last Year: Often true  Transportation Needs: Unmet Transportation Needs (02/11/2023)   PRAPARE - Administrator, Civil Service  (Medical): Yes    Lack of Transportation (Non-Medical): Yes  Physical Activity: Insufficiently Active (02/11/2023)   Exercise Vital Sign    Days of Exercise per Week: 1 day    Minutes of Exercise per Session: 20 min  Stress: Stress Concern Present (02/11/2023)   [employer] of Occupational Health - Occupational Stress Questionnaire    Feeling of Stress : Very much  Social Connections: Socially Isolated (02/11/2023)   Social Connection and Isolation Panel [NHANES]    Frequency of Communication with Friends and Family: Once a week    Frequency of Social Gatherings with Friends and Family: Never    Attends Religious Services: Never    Database administrator or Organizations: No    Attends Engineer, structural: Not on file    Marital Status: Living with partner     Family History: The patient's family history includes Breast cancer in her niece; Heart disease in her mother; Heart failure in her brother; Hypertension in her brother.  ROS:   Please see the history of present illness.     All other systems reviewed and are negative.  EKGs/Labs/Other Studies Reviewed:    The following studies were reviewed today:  Lexiscan Myoview 01/17/22 Exercise myocardial perfusion imaging study with no significant  ischemia Normal wall motion, EF estimated at 48% No EKG changes concerning for ischemia at peak stress or in recovery. Target heart rate achieved Exercise time 6 minutes, achieved 7 METS CT attenuation correction images with mild aortic atherosclerosis Low risk scan     TTE 10/01/21 1. Left ventricular ejection fraction, by estimation, is 45 to 50%. The  left ventricle has mildly decreased function. The left ventricle has no  regional wall motion abnormalities. Left ventricular diastolic parameters  are indeterminate.   2. Right ventricular systolic function is normal. The right ventricular  size is normal. RV pacer lead noted.   3. The mitral valve is normal in  structure. Mild mitral valve  regurgitation. No evidence of mitral stenosis.   4. The aortic valve is normal in structure. Aortic valve regurgitation is  not visualized. No aortic stenosis is present.   5. The inferior vena cava is normal in size with greater than 50%  respiratory variability, suggesting right atrial pressure of 3 mmHg.   EKG:  EKG is ordered today.  The ekg ordered today demonstrates NSR 75bpm, TWI aVL, TW flattening lateral leads  Recent Labs: 12/25/2022: ALT 18; BUN 12; Creatinine,  Ser 0.74; Hemoglobin 15.3; Platelets 253; Potassium 4.4; Sodium 140  Recent Lipid Panel    Component Value Date/Time   CHOL 135 12/25/2022 1000   TRIG 73 12/25/2022 1000   HDL 44 12/25/2022 1000   CHOLHDL 3.1 12/25/2022 1000   CHOLHDL 3.1 05/16/2022 1347   VLDL 15 05/16/2022 1347   LDLCALC 76 12/25/2022 1000   Physical Exam:    VS:  BP 114/60 (BP Location: Left Arm, Patient Position: Sitting, Cuff Size: Normal)   Pulse 75   Ht 5\' 6"  (1.676 m)   Wt 202 lb 9.6 oz (91.9 kg)   LMP 11/21/2020   SpO2 98%   BMI 32.70 kg/m     Wt Readings from Last 3 Encounters:  04/02/23 202 lb 9.6 oz (91.9 kg)  03/25/23 199 lb 3.2 oz (90.4 kg)  02/11/23 203 lb 6.4 oz (92.3 kg)     GEN:  Well nourished, well developed in no acute distress HEENT: Normal NECK: No JVD; No carotid bruits LYMPHATICS: No lymphadenopathy CARDIAC: RRR, no murmurs, rubs, gallops RESPIRATORY:  Clear to auscultation without rales, wheezing or rhonchi  ABDOMEN: Soft, non-tender, non-distended MUSCULOSKELETAL:  No edema; No deformity  SKIN: Warm and dry NEUROLOGIC:  Alert and oriented x 3 PSYCHIATRIC:  Normal affect   ASSESSMENT:    1. Chronic systolic heart failure (HCC)   2. ICD (implantable cardioverter-defibrillator) in place   3. SVT (supraventricular tachycardia)   4. Coronary artery disease involving native coronary artery of native heart without angina pectoris   5. Type 2 diabetes mellitus without  complication, long-term current use of insulin   6. Essential hypertension   7. Hyperlipidemia, mixed   8. Cryptogenic stroke [facility])    PLAN:    In order of problems listed above:  HFrEF S/p ICD Most recent echo 09/2021 showed LVEF 45-50%. The patient is euvolemic on exam. Soft Bps have limited titration of GDMT. Continue Coreg 3.125mg  BID, Jardiance 10mg  daily, Entresto 24-26mg  BID, and spironolactone 25mg  daily. I will refill Jardiance. She reports DOE after walking 15 minutes on a treadmill, but this is stable and unchanged. She will let us know if this worsens.   CAD s/p CABG No chest pain reported. Continue Aspirin, statin, SL NTG. No further ischemic work-up at this time.   HTN BP is normal today, continue current medications.  Mixed HLD LDL 76. Add Zetia 10mg  daily. Continue Lipitor 80mg  daily. Re-check lipids at follow-up.   H/o SVT s/p ablation No further palpitations, continue BB therapy.  Cryptogenic stroke Continue Aspirin and statin. She is followed by neurology.   Disposition: Follow up in 6 month(s) with MD/APP   Signed, Branae Crail David Stall, PA-C  04/02/2023 4:06 PM    [facility]

## 2023-04-02 NOTE — Patient Instructions (Signed)
Medication Instructions:  Your physician has recommended you make the following change in your medication:   ezetimibe (ZETIA) 10 MG tablet - Take 1 tablet (10 mg total) by mouth daily  *If you need a refill on your cardiac medications before your next appointment, please call your pharmacy*  Lab Work: -None ordered  Testing/Procedures: -None ordered  Follow-Up: At [facility], you and your health needs are our priority.  As part of our continuing mission to provide you with exceptional heart care, we have created designated Provider Care Teams.  These Care Teams include your primary Cardiologist (physician) and Advanced Practice Providers (APPs -  Physician Assistants and Nurse Practitioners) who all work together to provide you with the care you need, when you need it.  Your next appointment:   6 month(s)  Provider:   You may see Yvonne Kendall, MD or one of the following Advanced Practice Providers on your designated Care Team:   Nicolasa Ducking, NP Eula Listen, PA-C Cadence Fransico Michael, PA-C Charlsie Quest, NP    Other Instructions -None

## 2023-04-03 ENCOUNTER — Other Ambulatory Visit (HOSPITAL_COMMUNITY): Payer: Self-pay

## 2023-04-08 ENCOUNTER — Other Ambulatory Visit (HOSPITAL_COMMUNITY): Payer: Self-pay

## 2023-04-09 ENCOUNTER — Other Ambulatory Visit (HOSPITAL_COMMUNITY): Payer: Self-pay

## 2023-04-14 ENCOUNTER — Ambulatory Visit: Payer: Medicaid Other | Admitting: Podiatry

## 2023-04-14 DIAGNOSIS — M2041 Other hammer toe(s) (acquired), right foot: Secondary | ICD-10-CM | POA: Diagnosis not present

## 2023-04-14 DIAGNOSIS — M21611 Bunion of right foot: Secondary | ICD-10-CM

## 2023-04-14 NOTE — Progress Notes (Signed)
Chief Complaint  Patient presents with   Bunions    Pt stated that it is no better     Subjective: 57 y.o. female presenting today for f/u evaluation of painful bunion and 2nd hammertoe of right foot.  This has been getting worse over the past years.  Progressive onset.  She has tried different shoe gear modifications and arch supports with no relief of her symptoms.  It is now becoming symptomatic on a daily basis.  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  She lives with her boyfriend and has a daughter that lives nearby, who can assist her with driving and ADL's after surgery.  Denies foot trauma.    Patient last seen in the office on 01/07/2023.  At that time anti-inflammatories were discussed and conservative treatment modalities were discussed.  She has failed conservative treatment for the last 3 months.  She continues to have pain and tenderness.  Past Medical History:  Diagnosis Date   CAD (coronary artery disease)    CABG (07/2020; LIMA-LAD, RIMA-OM1)   CVA (cerebral vascular accident) (HCC)    Diabetes mellitus    HFrEF (heart failure with reduced ejection fraction) (HCC)    HLD (hyperlipidemia)    Hypertension    SVT (supraventricular tachycardia)    s/p ablation (11/2020)   Past Surgical History:  Procedure Laterality Date   CARDIAC CATHETERIZATION     CLIPPING OF ATRIAL APPENDAGE N/A 08/06/2020   Procedure: CLIPPING OF ATRIAL APPENDAGE USING ATRICURE CLIP SIZE ;  Surgeon: Linden Dolin, MD;  Location: [facility] OR;  Service: Open Heart Surgery;  Laterality: N/A;   COLONOSCOPY WITH PROPOFOL N/A 09/14/2021   Procedure: COLONOSCOPY WITH PROPOFOL;  Surgeon: Toney Reil, MD;  Location: [facility] ENDOSCOPY;  Service: Gastroenterology;  Laterality: N/A;   CORONARY ARTERY BYPASS GRAFT N/A 08/06/2020   Procedure: CORONARY ARTERY BYPASS GRAFTING (CABG) X2, USING  BILATERAL INTERNAL MAMMARY ARTERIES;  Surgeon: Linden Dolin, MD;  Location: [facility] OR;  Service: Open Heart Surgery;  Laterality: N/A;   ICD IMPLANT N/A 12/05/2020   Procedure: ICD IMPLANT;  Surgeon: Lanier Prude, MD;  Location: [facility] INVASIVE CV LAB;  Service: Cardiovascular;  Laterality: N/A;   KNEE SURGERY     RIGHT/LEFT HEART CATH AND CORONARY ANGIOGRAPHY Bilateral 08/02/2020   Procedure: RIGHT/LEFT HEART CATH AND CORONARY ANGIOGRAPHY;  Surgeon: Yvonne Kendall, MD;  Location: [facility] INVASIVE CV LAB;  Service: Cardiovascular;  Laterality: Bilateral;   SVT ABLATION N/A 12/05/2020   Procedure: SVT ABLATION;  Surgeon: Lanier Prude, MD;  Location: [facility] INVASIVE CV LAB;  Service: Cardiovascular;  Laterality: N/A;   TEE WITHOUT CARDIOVERSION N/A 07/18/2020   Procedure: TRANSESOPHAGEAL ECHOCARDIOGRAM (TEE);  Surgeon: Yvonne Kendall, MD;  Location: [facility] ORS;  Service: Cardiovascular;  Laterality: N/A;   TEE WITHOUT CARDIOVERSION N/A 08/06/2020   Procedure: TRANSESOPHAGEAL ECHOCARDIOGRAM (TEE);  Surgeon: Linden Dolin, MD;  Location: [facility] OR;  Service: Open Heart Surgery;  Laterality: N/A;   Allergies  Allergen Reactions   Lisinopril Cough     Objective: Physical Exam General: The patient is alert and oriented x3 in no acute distress.  Dermatology: Skin is cool, dry and supple bilateral lower extremities. Negative for open lesions or macerations.  Nails are in good condition and of adequate length.  Vascular: Palpable pedal pulses bilaterally. No edema or erythema noted. Capillary refill within normal limits.  Neurological:  Epicritic and protective threshold grossly intact bilaterally. Protective sensation intact bilateral toes.   Musculoskeletal Exam: Clinical evidence of bunion deformity noted to the right foot. There is moderate pain on palpation range of motion of the first MPJ. Lateral deviation of the hallux noted consistent with hallux abductovalgus. Hammertoe contracture also  noted on clinical exam to digits right 2nd toe. Symptomatic pain on palpation and range of motion also noted to the metatarsal phalangeal joints of the respective hammertoe digits.    Radiographic Exam Right foot 01/07/2023: Increased intermetatarsal angle greater than 15 with a hallux abductus angle greater than 30 noted on AP view. Contracture deformity also noted to the interphalangeal joints and MPJs of the digits of the right 2nd toe.   Assessment: 1. HAV w/ bunion deformity Right foot 2. Hammertoe deformity Right 2nd toe 3.  Type 2 diabetes - well controlled.   Plan of Care:  -Patient was evaluated. X-Rays reviewed again today.  Patient has failed conservative management of the bunion and hammertoe deformity including wide fitting shoes with arch supports.  She states that she has tried different shoes but she continues to have pain and tenderness now on a daily basis.  Is very debilitating and affecting her quality of life on a daily basis -Discussed conservative and surgical options thoroughly with patient, including risks involved with undergoing outpatient surgical correction of the right foot.  Also discussed sequelae if patient opts not to have surgical correction, including worsening of deformities and increased pain. -Discussed stoppage of ASA and Ozempic approximately one week prior to surgery. -Authorization for surgery was reinitiated today.  Surgery will consist of bunionectomy with osteotomy right.  Possible Akin osteotomy right.  PIPJ arthroplasty with MTP capsulotomy second right. -Medical clearance by PCP and cardiology prior to surgery -Return to clinic 1 week postop  *Going to school for LPN.  Finishes January 2025  Felecia Shelling, DPM [facility]  Dr. Felecia Shelling, DPM    [address] [place], [place] [address]                Office [phone]  Fax [phone]

## 2023-04-16 ENCOUNTER — Other Ambulatory Visit: Payer: Self-pay

## 2023-04-16 ENCOUNTER — Other Ambulatory Visit (HOSPITAL_COMMUNITY): Payer: Self-pay

## 2023-04-17 ENCOUNTER — Encounter: Payer: Self-pay | Admitting: Student

## 2023-04-17 ENCOUNTER — Encounter (INDEPENDENT_AMBULATORY_CARE_PROVIDER_SITE_OTHER): Payer: Self-pay

## 2023-04-17 ENCOUNTER — Encounter (INDEPENDENT_AMBULATORY_CARE_PROVIDER_SITE_OTHER): Payer: Medicaid Other | Admitting: Licensed Clinical Social Worker

## 2023-04-17 ENCOUNTER — Ambulatory Visit: Payer: Medicaid Other | Attending: Student | Admitting: Student

## 2023-04-17 NOTE — Progress Notes (Deleted)
  Electrophysiology Office Note:   ID:  Stacey, Richardson 1966-09-02, MRN [id number]  Primary Cardiologist: Yvonne Kendall, MD Electrophysiologist: Lanier Prude, MD  {Click to update primary MD,subspecialty MD or APP then REFRESH:1}    History of Present Illness:   Stacey Richardson is a 57 y.o. female with h/o chronic systolic CHF, CAD s/p CABG, SVT s/p ablation of AVNRT seen today for routine electrophysiology followup.   Since last being seen in our clinic the patient reports doing ***.  she denies chest pain, palpitations, dyspnea, PND, orthopnea, nausea, vomiting, dizziness, syncope, edema, weight gain, or early satiety.   Review of systems complete and found to be negative unless listed in HPI.   EP Information / Studies Reviewed:    EKG is not ordered today. EKG from 04/02/2023 reviewed which showed NSR at 75 bpm       ICD Interrogation-  reviewed in detail today,  See PACEART report.  Device History: Biotronik Single Chamber ICD implanted 2022 for CHF  Risk Assessment/Calculations:   {Does this patient have ATRIAL FIBRILLATION?[phone]} No BP recorded.  {Refresh Note OR Click here to enter BP  :1}***        Physical Exam:   VS:  LMP 11/21/2020    Wt Readings from Last 3 Encounters:  04/02/23 202 lb 9.6 oz (91.9 kg)  03/25/23 199 lb 3.2 oz (90.4 kg)  02/11/23 203 lb 6.4 oz (92.3 kg)     GEN: Well nourished, well developed in no acute distress NECK: No JVD; No carotid bruits CARDIAC: {EPRHYTHM:28826}, no murmurs, rubs, gallops RESPIRATORY:  Clear to auscultation without rales, wheezing or rhonchi  ABDOMEN: Soft, non-tender, non-distended EXTREMITIES:  No edema; No deformity   ASSESSMENT AND PLAN:    Chronic systolic dysfunction s/p Biotronik {Blank single:19197::"***","single chamber ICD","dual chamber ICD","CRT-D","S-ICD"}  LVEF 45-50% 09/2021  euvolemic today Stable on an appropriate medical regimen Normal ICD function See Pace Art report No changes  today  SVT  H/o AVRNT s/p ablation  No further symptoms.  CAD s/p CABG 07/2020 Denies s/s ischemia  Disposition:   Follow up with {EPPROVIDERS:28135} {EPFOLLOW UP:28173}   Signed, Graciella Freer, PA-C

## 2023-04-18 ENCOUNTER — Other Ambulatory Visit (HOSPITAL_COMMUNITY): Payer: Self-pay

## 2023-04-20 ENCOUNTER — Other Ambulatory Visit: Payer: Self-pay

## 2023-04-23 ENCOUNTER — Other Ambulatory Visit (HOSPITAL_COMMUNITY): Payer: Self-pay

## 2023-04-24 ENCOUNTER — Other Ambulatory Visit: Payer: Self-pay

## 2023-04-24 ENCOUNTER — Other Ambulatory Visit (HOSPITAL_COMMUNITY): Payer: Self-pay

## 2023-04-24 ENCOUNTER — Telehealth: Payer: Self-pay

## 2023-04-24 NOTE — Telephone Encounter (Addendum)
Pharmacy Patient Advocate Encounter  Received notification from [facility] that Prior Authorization for ENTRESTO 24-26MG  is needed.   Per Test claim: A Prior Authorization is not needed at this time and the LF was on 04/11/23

## 2023-04-27 ENCOUNTER — Other Ambulatory Visit (INDEPENDENT_AMBULATORY_CARE_PROVIDER_SITE_OTHER): Payer: Self-pay | Admitting: Primary Care

## 2023-04-27 MED ORDER — TETRAHYDROZOLINE HCL 0.05 % OP SOLN
1.0000 [drp] | Freq: Every day | OPHTHALMIC | 1 refills | Status: DC | PRN
  Filled 2023-04-27 – 2023-05-04 (×3): qty 15, 150d supply, fill #0

## 2023-04-28 ENCOUNTER — Other Ambulatory Visit (HOSPITAL_COMMUNITY): Payer: Self-pay

## 2023-04-28 ENCOUNTER — Other Ambulatory Visit (INDEPENDENT_AMBULATORY_CARE_PROVIDER_SITE_OTHER): Payer: Self-pay | Admitting: Primary Care

## 2023-04-28 ENCOUNTER — Telehealth (INDEPENDENT_AMBULATORY_CARE_PROVIDER_SITE_OTHER): Payer: Self-pay

## 2023-04-28 NOTE — Telephone Encounter (Signed)
Medication Refill - Medication: aspirin EC 81 MG tabletatorvastatin                           (LIPITOR) 80 MG tablet  Has the patient contacted their pharmacy? Yes.     Preferred Pharmacy (with phone number or street name):  [facility]  [address] [place] [address]  Phone: [phone] Fax: [phone]  Hours: M-F 7:30am-6pm        Has the patient been seen for an appointment in the last year OR does the patient have an upcoming appointment? Yes.    Agent: Please be advised that RX refills may take up to 3 business days. We ask that you follow-up with your pharmacy.

## 2023-04-28 NOTE — Telephone Encounter (Signed)
Sent request for aspirin to the office in a separate encounter.

## 2023-04-28 NOTE — Telephone Encounter (Signed)
Patient request refill of aspirin, unable to select reorder, received the below error message.    Telephone Encounter Stacey Richardson (Medical Admin)  Patient Engagement Center  Encounter Date: 04/28/2023  Signed Medication Refill - Medication: aspirin EC 81 MG tablet  Has the patient contacted their pharmacy? Yes.       Preferred Pharmacy (with phone number or street name):  [facility]  [address] [place] [address]  Phone: [phone] Fax: [phone]  Hours: M-F 7:30am-6pm              Has the patient been seen for an appointment in the last year OR does the patient have an upcoming appointment? Yes.     Agent: Please be advised that RX refills may take up to 3 business days. We ask that you follow-up with your pharmacy.

## 2023-04-30 ENCOUNTER — Telehealth: Payer: Self-pay | Admitting: Internal Medicine

## 2023-04-30 ENCOUNTER — Other Ambulatory Visit (INDEPENDENT_AMBULATORY_CARE_PROVIDER_SITE_OTHER): Payer: Self-pay | Admitting: Primary Care

## 2023-04-30 NOTE — Telephone Encounter (Signed)
The patient should restart atorvastatin given her history of coronary artery disease and prior CABG.  Unless she is having side effects from atorvastatin, high-intensity statin therapy is indicated for secondary prevention in patients with established atherosclerotic cardiovascular disease.  I will forward this to Ms. Higham's PCP as well.  Yvonne Kendall, MD [facility]

## 2023-04-30 NOTE — Telephone Encounter (Signed)
Pt c/o medication issue:  1. Name of Medication:   atorvastatin (LIPITOR) 80 MG tablet   2. How are you currently taking this medication (dosage and times per day)?   As prescribed  3. Are you having a reaction (difficulty breathing--STAT)?     4. What is your medication issue?   Patient stated her PCP took her off this medication as they told her her cholesterol levels were good.  Patient wants to know if she will need to stay on this medication from a cardiac standpoint.

## 2023-04-30 NOTE — Telephone Encounter (Signed)
Requested medication (s) are due for refill today: Yes  Requested medication (s) are on the active medication list: Yes  Last refill:  07/24/20  Future visit scheduled: No  Notes to clinic:  Unable to refill per protocol, last refill by another provider.      Requested Prescriptions  Pending Prescriptions Disp Refills   atorvastatin (LIPITOR) 80 MG tablet      Sig: Take 1 tablet (80 mg total) by mouth at bedtime.     Cardiovascular:  Antilipid - Statins Failed - 04/28/2023  2:26 PM      Failed - Lipid Panel in normal range within the last 12 months    Cholesterol, Total  Date Value Ref Range Status  12/25/2022 135 100 - 199 mg/dL Final   LDL Chol Calc (NIH)  Date Value Ref Range Status  12/25/2022 76 0 - 99 mg/dL Final   HDL  Date Value Ref Range Status  12/25/2022 44 >39 mg/dL Final   Triglycerides  Date Value Ref Range Status  12/25/2022 73 0 - 149 mg/dL Final         Passed - Patient is not pregnant      Passed - Valid encounter within last 12 months    Recent Outpatient Visits           1 month ago Insomnia, unspecified type   [place] [facility] Grayce Sessions, NP   2 months ago Encounter for screening for cervical cancer   [place] [facility] Grayce Sessions, NP   4 months ago Encounter to establish care    [facility] Grayce Sessions, NP

## 2023-04-30 NOTE — Telephone Encounter (Signed)
Returned pt call and made aware that she can purchase aspirin 81mg  otc. Pt states she understands and doesn't have any questions or concerns

## 2023-05-01 ENCOUNTER — Other Ambulatory Visit (HOSPITAL_COMMUNITY): Payer: Self-pay

## 2023-05-01 MED ORDER — ATORVASTATIN CALCIUM 80 MG PO TABS
80.0000 mg | ORAL_TABLET | Freq: Every day | ORAL | 3 refills | Status: DC
  Filled 2023-05-01: qty 90, 90d supply, fill #0
  Filled 2023-07-13: qty 30, 30d supply, fill #1
  Filled 2023-08-31: qty 30, 30d supply, fill #2
  Filled 2023-09-30: qty 30, 30d supply, fill #3
  Filled 2023-10-26: qty 30, 30d supply, fill #4
  Filled 2023-12-03: qty 30, 30d supply, fill #5
  Filled 2023-12-28: qty 30, 30d supply, fill #6
  Filled 2024-01-27: qty 30, 30d supply, fill #7
  Filled 2024-02-26: qty 30, 30d supply, fill #8
  Filled 2024-03-31: qty 30, 30d supply, fill #9
  Filled 2024-04-26: qty 30, 30d supply, fill #10

## 2023-05-01 NOTE — Telephone Encounter (Signed)
Msg left for pt to call back at this time 8/22

## 2023-05-01 NOTE — Telephone Encounter (Signed)
Called and spoke with patient. Informed patient of the following from Dr. Okey Dupre.  The patient should restart atorvastatin given her history of coronary artery disease and prior CABG.  Unless she is having side effects from atorvastatin, high-intensity statin therapy is indicated for secondary prevention in patients with established atherosclerotic cardiovascular disease.  I will forward this to [name]'s PCP as well.   Stacey Kendall, MD [facility]    Patient verbalizes understanding. Prescription sent to preferred pharmacy.

## 2023-05-01 NOTE — Telephone Encounter (Signed)
Follow Up:       Patient is retuning Stacey Richardson's call from today.

## 2023-05-01 NOTE — Telephone Encounter (Signed)
Patient returned staff call. 

## 2023-05-01 NOTE — Telephone Encounter (Signed)
Called patient and left message for call back.

## 2023-05-02 ENCOUNTER — Other Ambulatory Visit (HOSPITAL_COMMUNITY): Payer: Self-pay

## 2023-05-05 ENCOUNTER — Other Ambulatory Visit (HOSPITAL_COMMUNITY): Payer: Self-pay

## 2023-05-05 ENCOUNTER — Telehealth (INDEPENDENT_AMBULATORY_CARE_PROVIDER_SITE_OTHER): Payer: Self-pay

## 2023-05-05 NOTE — Telephone Encounter (Signed)
Copied from CRM [id number]. Topic: Referral - Request for Referral >> May 05, 2023  2:04 PM Marlow Baars wrote: Has patient seen PCP for this complaint? Yes.   Referral for which specialty: Orthopedics Preferred provider/office: [facility] Reason for referral: Left arm pain  The patient said she spoke briefly to her provider about her arm at her physical because it was hurting her some them but she would now like a referral as it still bothers her. She wonders if it maybe her rotator cuff. Please assist patient further. She also has an appt with her provider on Sept 4th to discuss further as well if necessary.

## 2023-05-05 NOTE — Telephone Encounter (Signed)
Will forward to provider  

## 2023-05-06 ENCOUNTER — Other Ambulatory Visit (INDEPENDENT_AMBULATORY_CARE_PROVIDER_SITE_OTHER): Payer: Self-pay | Admitting: Primary Care

## 2023-05-06 DIAGNOSIS — M79602 Pain in left arm: Secondary | ICD-10-CM

## 2023-05-13 ENCOUNTER — Ambulatory Visit: Payer: Medicaid Other | Admitting: Orthopaedic Surgery

## 2023-05-14 ENCOUNTER — Ambulatory Visit (INDEPENDENT_AMBULATORY_CARE_PROVIDER_SITE_OTHER): Payer: Medicaid Other | Admitting: Primary Care

## 2023-05-20 ENCOUNTER — Telehealth: Payer: Self-pay | Admitting: Internal Medicine

## 2023-05-20 ENCOUNTER — Other Ambulatory Visit (HOSPITAL_COMMUNITY): Payer: Self-pay

## 2023-05-20 ENCOUNTER — Ambulatory Visit: Payer: Medicaid Other | Admitting: Orthopaedic Surgery

## 2023-05-20 MED ORDER — ENTRESTO 24-26 MG PO TABS
1.0000 | ORAL_TABLET | Freq: Two times a day (BID) | ORAL | 4 refills | Status: DC
  Filled 2023-05-20: qty 60, 30d supply, fill #0
  Filled 2023-06-15: qty 60, 30d supply, fill #1
  Filled 2023-07-13: qty 60, 30d supply, fill #2
  Filled 2023-08-14: qty 60, 30d supply, fill #3
  Filled 2023-09-13: qty 60, 30d supply, fill #4

## 2023-05-20 MED ORDER — NITROGLYCERIN 0.4 MG SL SUBL
0.4000 mg | SUBLINGUAL_TABLET | SUBLINGUAL | 3 refills | Status: DC | PRN
  Filled 2023-05-20: qty 25, 15d supply, fill #0
  Filled 2024-05-03: qty 25, 15d supply, fill #1

## 2023-05-20 NOTE — Telephone Encounter (Signed)
*  STAT* If patient is at the pharmacy, call can be transferred to refill team.   1. Which medications need to be refilled? (please list name of each medication and dose if known) nitroGLYCERIN (NITROSTAT) 0.4 MG SL tablet  sacubitril-valsartan (ENTRESTO) 24-26 MG   2. Would you like to learn more about the convenience, safety, & potential cost savings by using the [facility]? Already uses [facility]   3. Are you open to using the [facility] Already uses [facility]   4. Which pharmacy/location (including street and city if local pharmacy) is medication to be sent to? [place] [facility]     5. Do they need a 30 day or 90 day supply? 30 Day Supply for both medication.   Pt is currently out of the [employer].

## 2023-05-20 NOTE — Telephone Encounter (Signed)
Requested Prescriptions   Signed Prescriptions Disp Refills   sacubitril-valsartan (ENTRESTO) 24-26 MG 60 tablet 4    Sig: Take 1 tablet by mouth 2 (two) times daily.    Authorizing Provider: END, CHRISTOPHER    Ordering User: Katrinka Blazing, Akia Desroches L   nitroGLYCERIN (NITROSTAT) 0.4 MG SL tablet 25 tablet 3    Sig: Place 1 tablet (0.4 mg total) under the tongue every 5 (five) minutes as needed for chest pain.    Authorizing Provider: END, CHRISTOPHER    Ordering User: Guerry Minors

## 2023-05-27 ENCOUNTER — Telehealth: Payer: Self-pay | Admitting: Internal Medicine

## 2023-05-27 NOTE — Telephone Encounter (Signed)
Spoke to patient and informed her that prior to any surgical procedure the physician performing said procedure will determine if they need cardiac clearance prior. At that point the patient would have a cardiac clearance appointment (if needed) or the physician will reach out to our clinic to request cardiac clearance. Patient understood with read back and stated that she will let the process play out.

## 2023-05-27 NOTE — Telephone Encounter (Signed)
Patient stated she wants to get a tummy tuck and wants Dr. Serita Kyle advice on if this is safe for her to have.

## 2023-05-29 ENCOUNTER — Other Ambulatory Visit (HOSPITAL_COMMUNITY): Payer: Self-pay

## 2023-06-03 ENCOUNTER — Ambulatory Visit (INDEPENDENT_AMBULATORY_CARE_PROVIDER_SITE_OTHER): Payer: Medicaid Other | Admitting: Primary Care

## 2023-06-04 ENCOUNTER — Other Ambulatory Visit (HOSPITAL_COMMUNITY): Payer: Self-pay

## 2023-06-06 ENCOUNTER — Ambulatory Visit (INDEPENDENT_AMBULATORY_CARE_PROVIDER_SITE_OTHER): Payer: Medicare Other

## 2023-06-06 DIAGNOSIS — I5022 Chronic systolic (congestive) heart failure: Secondary | ICD-10-CM | POA: Diagnosis not present

## 2023-06-07 LAB — CUP PACEART REMOTE DEVICE CHECK
Date Time Interrogation Session: 20240927092153
Implantable Lead Connection Status: 753985
Implantable Lead Implant Date: 20220329
Implantable Lead Location: 753860
Implantable Lead Model: 436909
Implantable Lead Serial Number: 81437635
Implantable Pulse Generator Implant Date: 20220329
Pulse Gen Model: 429525
Pulse Gen Serial Number: 84824217

## 2023-06-12 NOTE — Progress Notes (Signed)
Remote ICD transmission.   

## 2023-06-15 ENCOUNTER — Other Ambulatory Visit (INDEPENDENT_AMBULATORY_CARE_PROVIDER_SITE_OTHER): Payer: Self-pay | Admitting: Primary Care

## 2023-06-15 DIAGNOSIS — E119 Type 2 diabetes mellitus without complications: Secondary | ICD-10-CM

## 2023-06-16 ENCOUNTER — Other Ambulatory Visit (HOSPITAL_COMMUNITY): Payer: Self-pay

## 2023-06-16 ENCOUNTER — Other Ambulatory Visit: Payer: Self-pay

## 2023-06-16 NOTE — Telephone Encounter (Signed)
Will forward to provider. Pt sees cardiology but you refilled last

## 2023-06-17 ENCOUNTER — Other Ambulatory Visit: Payer: Self-pay

## 2023-06-17 ENCOUNTER — Other Ambulatory Visit (HOSPITAL_COMMUNITY): Payer: Self-pay

## 2023-06-18 ENCOUNTER — Other Ambulatory Visit: Payer: Self-pay

## 2023-06-19 ENCOUNTER — Encounter: Payer: Self-pay | Admitting: Podiatry

## 2023-06-19 ENCOUNTER — Telehealth: Payer: Self-pay | Admitting: *Deleted

## 2023-06-19 NOTE — Telephone Encounter (Signed)
   Pre-operative Risk Assessment    Patient Name: Stacey Richardson  DOB: 1966-08-01 MRN: [id number]      Request for Surgical Clearance    Procedure:   Austin Bunionectomy, MPJ Capsulotomy, and Hammer Toe Repair.   Date of Surgery:  Clearance TBD                                 Surgeon:  Dr. Gala Lewandowsky Surgeon's Group or Practice Name:  [facility] Phone number:  [phone] Fax number:  [phone]   Type of Clearance Requested:   - Medical  - Pharmacy:  Hold Aspirin Not Indicated.   Type of Anesthesia:  General    Additional requests/questions:    Signed, Emmit Pomfret   06/19/2023, 4:02 PM

## 2023-06-20 ENCOUNTER — Telehealth: Payer: Self-pay | Admitting: Internal Medicine

## 2023-06-20 ENCOUNTER — Other Ambulatory Visit (HOSPITAL_BASED_OUTPATIENT_CLINIC_OR_DEPARTMENT_OTHER): Payer: Self-pay

## 2023-06-20 ENCOUNTER — Other Ambulatory Visit (HOSPITAL_COMMUNITY): Payer: Self-pay

## 2023-06-20 NOTE — Telephone Encounter (Signed)
Attempted to contact pt to confirm current dose. Received message stating your call can not be completed as dialed.

## 2023-06-20 NOTE — Telephone Encounter (Signed)
Good Morning,  Could you please advise if the patient is suppose to be taking 25 mg daily of spironolactone or 12.5 mg? The patient was last seen by Cadence on 04/02/23. In her office visit note it states for the patient to take 25 mg daily, but her med list states 12.5 mg daily. Thank you so much.

## 2023-06-20 NOTE — Telephone Encounter (Signed)
Tried to call the pt but line was busy

## 2023-06-20 NOTE — Telephone Encounter (Signed)
   Name: Stacey Richardson  DOB: 1966-01-08  MRN: [id number]  Primary Cardiologist: Yvonne Kendall, MD   Preoperative team, please contact this patient and set up a phone call appointment for further preoperative risk assessment. Please obtain consent and complete medication review. Thank you for your help.  I confirm that guidance regarding antiplatelet and oral anticoagulation therapy has been completed and, if necessary, noted below.  I also confirmed the patient resides in the state of [place]. As per [facility] Medical [employer] telemedicine laws, the patient must reside in the state in which the provider is licensed.  Per office protocol, if patient is without any new symptoms or concerns at the time of their virtual visit, she may hold Aspirin for 5-7 days prior to procedure. Please resume Aspirin as soon as possible postprocedure, at the discretion of the surgeon.    Joylene Grapes, NP 06/20/2023, 9:07 AM [facility]

## 2023-06-20 NOTE — Telephone Encounter (Signed)
*  STAT* If patient is at the pharmacy, call can be transferred to refill team.   1. Which medications need to be refilled? (please list name of each medication and dose if known) spironolactone (ALDACTONE) 25 MG tablet   2. Which pharmacy/location (including street and city if local pharmacy) is medication to be sent to? [place] [facility]   3. Do they need a 30 day or 90 day supply? 90

## 2023-06-25 ENCOUNTER — Telehealth: Payer: Self-pay | Admitting: *Deleted

## 2023-06-25 NOTE — Telephone Encounter (Signed)
Pt has been scheduled for tele pre op appt 07/08/23. Med rec and consent are done.      Patient Consent for Virtual Visit        Stacey Richardson has provided verbal consent on 06/25/2023 for a virtual visit (video or telephone).   CONSENT FOR VIRTUAL VISIT FOR:  Stacey Richardson  By participating in this virtual visit I agree to the following:  I hereby voluntarily request, consent and authorize [facility] and its employed or contracted physicians, physician assistants, nurse practitioners or other licensed health care professionals (the Practitioner), to provide me with telemedicine health care services (the "Services") as deemed necessary by the treating Practitioner. I acknowledge and consent to receive the Services by the Practitioner via telemedicine. I understand that the telemedicine visit will involve communicating with the Practitioner through live audiovisual communication technology and the disclosure of certain medical information by electronic transmission. I acknowledge that I have been given the opportunity to request an in-person assessment or other available alternative prior to the telemedicine visit and am voluntarily participating in the telemedicine visit.  I understand that I have the right to withhold or withdraw my consent to the use of telemedicine in the course of my care at any time, without affecting my right to future care or treatment, and that the Practitioner or I may terminate the telemedicine visit at any time. I understand that I have the right to inspect all information obtained and/or recorded in the course of the telemedicine visit and may receive copies of available information for a reasonable fee.  I understand that some of the potential risks of receiving the Services via telemedicine include:  Delay or interruption in medical evaluation due to technological equipment failure or disruption; Information transmitted may not be sufficient (e.g. poor  resolution of images) to allow for appropriate medical decision making by the Practitioner; and/or  In rare instances, security protocols could fail, causing a breach of personal health information.  Furthermore, I acknowledge that it is my responsibility to provide information about my medical history, conditions and care that is complete and accurate to the best of my ability. I acknowledge that Practitioner's advice, recommendations, and/or decision may be based on factors not within their control, such as incomplete or inaccurate data provided by me or distortions of diagnostic images or specimens that may result from electronic transmissions. I understand that the practice of medicine is not an exact science and that Practitioner makes no warranties or guarantees regarding treatment outcomes. I acknowledge that a copy of this consent can be made available to me via my patient portal [facility] MyChart), or I can request a printed copy by calling the office of [facility].    I understand that my insurance will be billed for this visit.   I have read or had this consent read to me. I understand the contents of this consent, which adequately explains the benefits and risks of the Services being provided via telemedicine.  I have been provided ample opportunity to ask questions regarding this consent and the Services and have had my questions answered to my satisfaction. I give my informed consent for the services to be provided through the use of telemedicine in my medical care

## 2023-06-25 NOTE — Telephone Encounter (Signed)
Pt has been scheduled for tele pre op appt 07/08/23. Med rec and consent are done.

## 2023-07-03 ENCOUNTER — Other Ambulatory Visit: Payer: Self-pay

## 2023-07-03 ENCOUNTER — Other Ambulatory Visit (INDEPENDENT_AMBULATORY_CARE_PROVIDER_SITE_OTHER): Payer: Self-pay | Admitting: Primary Care

## 2023-07-03 ENCOUNTER — Other Ambulatory Visit (HOSPITAL_COMMUNITY): Payer: Self-pay

## 2023-07-03 DIAGNOSIS — E119 Type 2 diabetes mellitus without complications: Secondary | ICD-10-CM

## 2023-07-03 MED ORDER — CARVEDILOL 3.125 MG PO TABS
3.1250 mg | ORAL_TABLET | Freq: Two times a day (BID) | ORAL | 0 refills | Status: DC
Start: 2023-07-03 — End: 2023-10-06
  Filled 2023-07-03 – 2023-07-14 (×2): qty 60, 30d supply, fill #0
  Filled 2023-08-09: qty 60, 30d supply, fill #1
  Filled 2023-09-10: qty 60, 30d supply, fill #2

## 2023-07-07 ENCOUNTER — Telehealth (INDEPENDENT_AMBULATORY_CARE_PROVIDER_SITE_OTHER): Payer: Self-pay | Admitting: Primary Care

## 2023-07-07 ENCOUNTER — Other Ambulatory Visit: Payer: Self-pay

## 2023-07-07 ENCOUNTER — Ambulatory Visit (INDEPENDENT_AMBULATORY_CARE_PROVIDER_SITE_OTHER): Payer: Medicare Other | Admitting: Primary Care

## 2023-07-07 ENCOUNTER — Telehealth: Payer: Self-pay

## 2023-07-07 NOTE — Telephone Encounter (Signed)
Left VM with pt to make apt virtual. Pt did not answer and if pt does not call back apt will be canceled and we can reschedule another apt.

## 2023-07-07 NOTE — Telephone Encounter (Signed)
Stacey Richardson called to cancel her surgery with Dr. Logan Bores on 07/17/2023. She stated her foot is much better. She will call if she needs [place]. Notified Dr. Logan Bores and Aram Beecham at [facility].

## 2023-07-08 ENCOUNTER — Ambulatory Visit: Payer: Medicare Other | Attending: Nurse Practitioner

## 2023-07-08 ENCOUNTER — Telehealth: Payer: Self-pay | Admitting: Nurse Practitioner

## 2023-07-08 DIAGNOSIS — Z01810 Encounter for preprocedural cardiovascular examination: Secondary | ICD-10-CM

## 2023-07-08 NOTE — Telephone Encounter (Addendum)
Call was placed this afternoon for scheduled telephone visit for preoperative clearance with [name].  She was unavailable at designated time and message was left to return our call at her earliest convenience.  Addendum: Patient will need to be rescheduled and was a no-show for today's visit.  Robin Searing, NP

## 2023-07-08 NOTE — Progress Notes (Unsigned)
Virtual Visit via Telephone Note   Because of Stacey Richardson's co-morbid illnesses, she is at least at moderate risk for complications without adequate follow up.  This format is felt to be most appropriate for this patient at this time.  The patient did not have access to video technology/had technical difficulties with video requiring transitioning to audio format only (telephone).  All issues noted in this document were discussed and addressed.  No physical exam could be performed with this format.  Please refer to the patient's chart for her consent to telehealth for [facility].  Evaluation Performed:  Preoperative cardiovascular risk assessment _____________   Date:  07/08/2023   Patient ID:  Stacey Richardson, DOB July 09, 1966, MRN [id number] Patient Location:  Home Provider location:   Office  Primary Care Provider:  Grayce Sessions, NP Primary Cardiologist:  Yvonne Kendall, MD  Chief Complaint / Patient Profile   57 y.o. y/o female with a h/o CAD s/p CABG, CVA, HFrEF, ICM, HTN, HLD, DM type II, SVT s/p ablation and ICD insertion, obesity who is pending bunionectomy with hammertoe repair and presents today for telephonic preoperative cardiovascular risk assessment.  History of Present Illness    Stacey Richardson is a 57 y.o. female who presents via audio/video conferencing for a telehealth visit today.  Pt was last seen in cardiology clinic on 03/29/2023 by Cadence Fransico Michael, PA.  At that time DAIVA SINER was doing well overall but noted SOB when walking on a treadmill but symptom is stable and unchanged from previous.  She exercised a total of 30 minutes on the treadmill.  The patient is now pending procedure as outlined above. Since her last visit, she ***  *** denies chest pain, shortness of breath, lower extremity edema, fatigue, palpitations, melena, hematuria, hemoptysis, diaphoresis, weakness, presyncope, syncope, orthopnea, and PND.   Patient can hold aspirin 5  to 7 days prior to procedure  Past Medical History    Past Medical History:  Diagnosis Date   CAD (coronary artery disease)    CABG (07/2020; LIMA-LAD, RIMA-OM1)   CVA (cerebral vascular accident) (HCC)    Diabetes mellitus    HFrEF (heart failure with reduced ejection fraction) (HCC)    HLD (hyperlipidemia)    Hypertension    SVT (supraventricular tachycardia)    s/p ablation (11/2020)   Past Surgical History:  Procedure Laterality Date   CARDIAC CATHETERIZATION     CLIPPING OF ATRIAL APPENDAGE N/A 08/06/2020   Procedure: CLIPPING OF ATRIAL APPENDAGE USING ATRICURE CLIP SIZE ;  Surgeon: Linden Dolin, MD;  Location: [facility] OR;  Service: Open Heart Surgery;  Laterality: N/A;   COLONOSCOPY WITH PROPOFOL N/A 09/14/2021   Procedure: COLONOSCOPY WITH PROPOFOL;  Surgeon: Toney Reil, MD;  Location: [facility] ENDOSCOPY;  Service: Gastroenterology;  Laterality: N/A;   CORONARY ARTERY BYPASS GRAFT N/A 08/06/2020   Procedure: CORONARY ARTERY BYPASS GRAFTING (CABG) X2, USING BILATERAL INTERNAL MAMMARY ARTERIES;  Surgeon: Linden Dolin, MD;  Location: [facility] OR;  Service: Open Heart Surgery;  Laterality: N/A;   ICD IMPLANT N/A 12/05/2020   Procedure: ICD IMPLANT;  Surgeon: Lanier Prude, MD;  Location: [facility] INVASIVE CV LAB;  Service: Cardiovascular;  Laterality: N/A;   KNEE SURGERY     RIGHT/LEFT HEART CATH AND CORONARY ANGIOGRAPHY Bilateral 08/02/2020   Procedure: RIGHT/LEFT HEART CATH AND CORONARY ANGIOGRAPHY;  Surgeon: Yvonne Kendall, MD;  Location: [facility] INVASIVE CV LAB;  Service: Cardiovascular;  Laterality: Bilateral;   SVT ABLATION N/A 12/05/2020  Procedure: SVT ABLATION;  Surgeon: Lanier Prude, MD;  Location: [facility] INVASIVE CV LAB;  Service: Cardiovascular;  Laterality: N/A;   TEE WITHOUT CARDIOVERSION N/A 07/18/2020   Procedure: TRANSESOPHAGEAL ECHOCARDIOGRAM (TEE);  Surgeon: Yvonne Kendall, MD;  Location: [facility] ORS;  Service: Cardiovascular;  Laterality: N/A;   TEE WITHOUT  CARDIOVERSION N/A 08/06/2020   Procedure: TRANSESOPHAGEAL ECHOCARDIOGRAM (TEE);  Surgeon: Linden Dolin, MD;  Location: [facility] OR;  Service: Open Heart Surgery;  Laterality: N/A;    Allergies  Allergies  Allergen Reactions   Lisinopril Cough    Home Medications    Prior to Admission medications   Medication Sig Start Date End Date Taking? Authorizing Provider  metroNIDAZOLE (FLAGYL) 500 MG tablet Take 1 tablet (500 mg total) by mouth 2 (two) times daily. Patient not taking: Reported on 04/02/2023 02/14/23   Grayce Sessions, NP  acetaminophen (TYLENOL) 500 MG tablet Take 1 tablet (500 mg total) by mouth every 6 (six) hours as needed. 02/18/23   Felecia Shelling, DPM  alum & mag hydroxide-simeth (MAALOX ADVANCED MAX ST) 400-400-40 MG/5ML suspension Take 15 mLs by mouth every 6 (six) hours as needed for indigestion. 12/09/20   Mesner, Barbara Cower, MD  aspirin EC 81 MG tablet Take 81 mg by mouth daily.     [provider]  atorvastatin (LIPITOR) 80 MG tablet Take 1 tablet (80 mg total) by mouth daily. 05/01/23 08/06/23  End, Cristal Deer, MD  carvedilol (COREG) 3.125 MG tablet Take 1 tablet (3.125 mg total) by mouth 2 (two) times daily. 07/03/23   Grayce Sessions, NP  escitalopram (LEXAPRO) 20 MG tablet Take 1 tablet (20 mg total) by mouth daily. 03/25/23   Grayce Sessions, NP  ezetimibe (ZETIA) 10 MG tablet Take 1 tablet (10 mg total) by mouth daily. 04/02/23   Furth, Cadence H, PA-C  JARDIANCE 10 MG TABS tablet Take 1 tablet (10 mg total) by mouth daily. 04/02/23   Furth, Cadence H, PA-C  nitroGLYCERIN (NITROSTAT) 0.4 MG SL tablet Place 1 tablet (0.4 mg total) under the tongue every 5 (five) minutes as needed for chest pain. 05/20/23   End, Cristal Deer, MD  OZEMPIC, 0.25 OR 0.5 MG/DOSE, 2 MG/3ML SOPN Inject 0.5 mg into the skin once a week. 03/25/23   Grayce Sessions, NP  polyethylene glycol powder (GLYCOLAX/MIRALAX) 17 GM/SCOOP powder Take 17 g by mouth as needed for moderate  constipation. 09/24/22   [provider]  sacubitril-valsartan (ENTRESTO) 24-26 MG Take 1 tablet by mouth 2 (two) times daily. 05/20/23   End, Cristal Deer, MD  spironolactone (ALDACTONE) 25 MG tablet TAKE 1/2 TABLET BY MOUTH EVERY DAY 06/10/22   End, Cristal Deer, MD  tetrahydrozoline 0.05 % ophthalmic solution Place 1-2 drops into both eyes daily as needed (irritated eyes.). visine 04/27/23   Grayce Sessions, NP    Physical Exam    Vital Signs:  Stacey Richardson does not have vital signs available for review today.***  Given telephonic nature of communication, physical exam is limited. AAOx3. NAD. Normal affect.  Speech and respirations are unlabored.  Accessory Clinical Findings    None  Assessment & Plan    1.  Preoperative Cardiovascular Risk Assessment: -Patient's RCRI score is 11%  The patient was advised that if she develops new symptoms prior to surgery to contact our office to arrange for a follow-up visit, and she verbalized understanding.  (Reminder: Include SBE prophylaxis/Antiplatelet/Anticoag Instructions***)  A copy of this note will be routed to requesting surgeon.  Time:  Today, I have spent *** minutes with the patient with telehealth technology discussing medical history, symptoms, and management plan.     Napoleon Form, Leodis Rains, NP  07/08/2023, 7:35 AM

## 2023-07-08 NOTE — Telephone Encounter (Signed)
I will update the requesting office that the pt missed her tele pre op appt today and will need to call back and reschedule.

## 2023-07-08 NOTE — Telephone Encounter (Signed)
I will update the requesting office that the pt missed her tele pre op appt today and will need to call back and reschedule.       Note   Gaston Islam., NP routed conversation to Cv Div Preop Callback30 minutes ago (3:51 PM)   Gaston Islam., NP1 hour ago (2:23 PM)    Call was placed this afternoon for scheduled telephone visit for preoperative clearance with [name].  She was unavailable at designated time and message was left to return our call at her earliest convenience.   Addendum: Patient will need to be rescheduled and was a no-show for today's visit.   Robin Searing, NP      Note

## 2023-07-10 ENCOUNTER — Other Ambulatory Visit: Payer: Self-pay

## 2023-07-14 ENCOUNTER — Other Ambulatory Visit (HOSPITAL_COMMUNITY): Payer: Self-pay

## 2023-07-14 ENCOUNTER — Other Ambulatory Visit: Payer: Self-pay

## 2023-07-16 ENCOUNTER — Other Ambulatory Visit: Payer: Self-pay

## 2023-07-17 ENCOUNTER — Other Ambulatory Visit (HOSPITAL_COMMUNITY): Payer: Self-pay

## 2023-07-17 ENCOUNTER — Ambulatory Visit (INDEPENDENT_AMBULATORY_CARE_PROVIDER_SITE_OTHER): Payer: Medicare Other

## 2023-07-17 ENCOUNTER — Telehealth: Payer: Self-pay | Admitting: Internal Medicine

## 2023-07-17 DIAGNOSIS — I10 Essential (primary) hypertension: Secondary | ICD-10-CM

## 2023-07-17 DIAGNOSIS — I5022 Chronic systolic (congestive) heart failure: Secondary | ICD-10-CM

## 2023-07-17 MED ORDER — SPIRONOLACTONE 25 MG PO TABS
12.5000 mg | ORAL_TABLET | Freq: Every day | ORAL | 0 refills | Status: DC
Start: 2023-07-17 — End: 2023-10-09
  Filled 2023-07-17 (×2): qty 15, 30d supply, fill #0
  Filled 2023-08-12: qty 30, 60d supply, fill #1

## 2023-07-17 NOTE — Telephone Encounter (Signed)
*  STAT* If patient is at the pharmacy, call can be transferred to refill team.   1. Which medications need to be refilled? (please list name of each medication and dose if known)  spironolactone (ALDACTONE) 25 MG tablet    2. Would you like to learn more about the convenience, safety, & potential cost savings by using the [facility]? Already using   3. Are you open to using the [facility] (Type [facility]. Already using   4. Which pharmacy/location (including street and city if local pharmacy) is medication to be sent to? [place] [facility]    5. Do they need a 30 day or 90 day supply? 90 day  Only has 3 tablets left

## 2023-07-17 NOTE — Telephone Encounter (Signed)
Requested Prescriptions   Signed Prescriptions Disp Refills   spironolactone (ALDACTONE) 25 MG tablet 45 tablet 0    Sig: Take 0.5 tablets (12.5 mg total) by mouth daily.    Authorizing Provider: END, CHRISTOPHER    Ordering User: Kendrick Fries

## 2023-07-18 ENCOUNTER — Other Ambulatory Visit (HOSPITAL_COMMUNITY): Payer: Self-pay

## 2023-07-23 ENCOUNTER — Encounter: Payer: Medicare Other | Admitting: Podiatry

## 2023-07-24 ENCOUNTER — Ambulatory Visit (INDEPENDENT_AMBULATORY_CARE_PROVIDER_SITE_OTHER): Payer: Medicare Other

## 2023-07-28 ENCOUNTER — Ambulatory Visit (INDEPENDENT_AMBULATORY_CARE_PROVIDER_SITE_OTHER): Payer: Medicare Other

## 2023-07-28 DIAGNOSIS — Z23 Encounter for immunization: Secondary | ICD-10-CM

## 2023-07-28 NOTE — Patient Instructions (Signed)

## 2023-07-30 ENCOUNTER — Encounter: Payer: Medicaid Other | Admitting: Podiatry

## 2023-08-08 ENCOUNTER — Other Ambulatory Visit (HOSPITAL_COMMUNITY): Payer: Self-pay

## 2023-08-08 ENCOUNTER — Other Ambulatory Visit: Payer: Self-pay

## 2023-08-09 ENCOUNTER — Other Ambulatory Visit (HOSPITAL_COMMUNITY): Payer: Self-pay

## 2023-08-12 ENCOUNTER — Other Ambulatory Visit: Payer: Self-pay

## 2023-08-13 ENCOUNTER — Encounter: Payer: Medicaid Other | Admitting: Podiatry

## 2023-08-14 ENCOUNTER — Other Ambulatory Visit (HOSPITAL_COMMUNITY): Payer: Self-pay

## 2023-08-18 ENCOUNTER — Other Ambulatory Visit: Payer: Self-pay

## 2023-09-01 ENCOUNTER — Other Ambulatory Visit (HOSPITAL_COMMUNITY): Payer: Self-pay

## 2023-09-05 ENCOUNTER — Ambulatory Visit (INDEPENDENT_AMBULATORY_CARE_PROVIDER_SITE_OTHER): Payer: Medicare Other

## 2023-09-05 DIAGNOSIS — I255 Ischemic cardiomyopathy: Secondary | ICD-10-CM

## 2023-09-06 ENCOUNTER — Other Ambulatory Visit (HOSPITAL_COMMUNITY): Payer: Self-pay

## 2023-09-06 LAB — CUP PACEART REMOTE DEVICE CHECK
Date Time Interrogation Session: 20241227064149
Implantable Lead Connection Status: 753985
Implantable Lead Implant Date: 20220329
Implantable Lead Location: 753860
Implantable Lead Model: 436909
Implantable Lead Serial Number: 81437635
Implantable Pulse Generator Implant Date: 20220329
Pulse Gen Model: 429525
Pulse Gen Serial Number: 84824217

## 2023-09-08 ENCOUNTER — Encounter: Payer: Self-pay | Admitting: Cardiology

## 2023-09-10 ENCOUNTER — Other Ambulatory Visit (HOSPITAL_COMMUNITY): Payer: Self-pay

## 2023-09-13 ENCOUNTER — Other Ambulatory Visit (HOSPITAL_COMMUNITY): Payer: Self-pay

## 2023-09-15 ENCOUNTER — Other Ambulatory Visit: Payer: Self-pay

## 2023-09-30 ENCOUNTER — Other Ambulatory Visit (HOSPITAL_BASED_OUTPATIENT_CLINIC_OR_DEPARTMENT_OTHER): Payer: Self-pay

## 2023-10-01 ENCOUNTER — Telehealth: Payer: Self-pay | Admitting: Primary Care

## 2023-10-01 NOTE — Telephone Encounter (Signed)
Copied from CRM [id number]. Topic: General - Other >> Sep 29, 2023  1:49 PM Phill Myron wrote: Patient needs a current letter stating when she received her flu shot, and the type of flu shot she received.  Can she pick up the letter or have is sent through mychart . Please advise

## 2023-10-02 ENCOUNTER — Encounter (INDEPENDENT_AMBULATORY_CARE_PROVIDER_SITE_OTHER): Payer: Self-pay

## 2023-10-02 ENCOUNTER — Other Ambulatory Visit (HOSPITAL_COMMUNITY): Payer: Self-pay

## 2023-10-02 NOTE — Telephone Encounter (Signed)
Sent letter through MyChart.

## 2023-10-03 ENCOUNTER — Other Ambulatory Visit: Payer: Self-pay

## 2023-10-06 ENCOUNTER — Other Ambulatory Visit (INDEPENDENT_AMBULATORY_CARE_PROVIDER_SITE_OTHER): Payer: Self-pay | Admitting: Primary Care

## 2023-10-06 DIAGNOSIS — E119 Type 2 diabetes mellitus without complications: Secondary | ICD-10-CM

## 2023-10-07 NOTE — Telephone Encounter (Signed)
No labs done since 12/25/22. Pt last picked rx on 09/11/23 for 30 day supply. Will forward to provider. Please refill if appropriate

## 2023-10-09 ENCOUNTER — Other Ambulatory Visit: Payer: Self-pay | Admitting: Internal Medicine

## 2023-10-09 ENCOUNTER — Institutional Professional Consult (permissible substitution): Payer: Medicare Other | Admitting: Plastic Surgery

## 2023-10-09 DIAGNOSIS — I10 Essential (primary) hypertension: Secondary | ICD-10-CM

## 2023-10-09 DIAGNOSIS — I5022 Chronic systolic (congestive) heart failure: Secondary | ICD-10-CM

## 2023-10-09 NOTE — Telephone Encounter (Signed)
Please contact pt for future appointment. Pt due for 6 month f/u.

## 2023-10-09 NOTE — Addendum Note (Signed)
Addended by: Elease Etienne A on: 10/09/2023 01:34 PM   Modules accepted: Orders

## 2023-10-09 NOTE — Progress Notes (Signed)
Remote ICD transmission.

## 2023-10-10 ENCOUNTER — Other Ambulatory Visit: Payer: Self-pay

## 2023-10-10 ENCOUNTER — Other Ambulatory Visit (HOSPITAL_COMMUNITY): Payer: Self-pay

## 2023-10-10 MED ORDER — SPIRONOLACTONE 25 MG PO TABS
12.5000 mg | ORAL_TABLET | Freq: Every day | ORAL | 1 refills | Status: DC
Start: 2023-10-10 — End: 2024-04-02
  Filled 2023-10-10: qty 45, 90d supply, fill #0
  Filled 2024-01-09: qty 45, 90d supply, fill #1

## 2023-10-13 ENCOUNTER — Other Ambulatory Visit (HOSPITAL_COMMUNITY): Payer: Self-pay

## 2023-10-16 ENCOUNTER — Other Ambulatory Visit: Payer: Self-pay

## 2023-10-17 ENCOUNTER — Other Ambulatory Visit: Payer: Self-pay

## 2023-10-17 ENCOUNTER — Ambulatory Visit: Payer: Medicaid Other | Admitting: Medical

## 2023-10-17 MED ORDER — CARVEDILOL 3.125 MG PO TABS
3.1250 mg | ORAL_TABLET | Freq: Two times a day (BID) | ORAL | 0 refills | Status: DC
Start: 2023-10-17 — End: 2024-01-08
  Filled 2023-10-17: qty 180, 90d supply, fill #0

## 2023-10-23 NOTE — Progress Notes (Unsigned)
Cardiology Clinic Note   Date: 10/24/2023 ID: Miah, Boye 21-Aug-1966, MRN [id number]  Primary Cardiologist:  Yvonne Kendall, MD  Chief Complaint   Stacey Richardson is a 58 y.o. female who presents to the clinic today for routine follow up.   Patient Profile   Stacey Richardson is followed by Dr. Okey Dupre for the history outlined below.      Past medical history significant for: CAD. LHC 08/02/2020: Severe ostial LMCA stenosis 80% with pressure dampening of 47F diagnostic catheter.  No significant improvement with IC NTG.  Mild nonobstructive CAD involving LAD and dominant LCx.  CTS consult. 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 Nuclear stress test 01/17/2022: No significant ischemia.  No EKG changes concerning for ischemia at peak stress or in recovery.  EF estimated at 48%.  Low risk scan. Chronic HFrEF/ischemic cardiomyopathy. RHC 08/02/2020: Normal left and right heart filling pressures, normal Fick cardiac output/index. ICD implantation 12/05/2020. Remote device check 09/05/2023: Normal device function.  Lead parameters stable. Echo 10/01/2021: EF 45 to 50%.  No RWMA.  Indeterminate diastolic parameters.  Normal RV size/function.  Mild MR.  (EF 30 to 35% on prior echo March 2022). SVT. SVT ablation 12/05/2020. Hypertension. Hyperlipidemia. Lipid panel 12/25/2022: LDL 76, HDL 44, TG 73, total 135. Stroke. T2DM.  In summary, patient presented to the ED in October 2021 with sudden onset of slurred speech.  She reported she was making a sandwich when she got news that her boyfriend had been in an MVA.  She had a brief episode of dizziness and presyncope.  She then contacted her boyfriend's father to drive her to the hospital and it was noted that her speech was slurred and she was having difficulty finding words.  Upon arrival to the ED she was unable to communicate verbally with triage staff but was able to write.  EKG showed sinus tachycardia with PVCs and no acute ST-T wave  changes.  Chest x-ray showed mild cardiomegaly with slight central congestion.  Head CT without acute intracranial pathology.  MRI/MRA brain/head showed 6 mm acute ischemic nonhemorrhagic cortical infarct involving the left frontal operculum with otherwise no acute findings.  tPA was not given due to improvement in symptoms.  Echo showed EF 35 to 40%, mild LVH, normal RV size/function, mild to moderate MR.  Patient was discharged from the hospital to undergo further cardiology workup as an outpatient.  She underwent 14-day ZIO monitoring which did not show any evidence of A-fib/flutter.  She did have brief episodes of Wenckebach at beta-blocker was discontinued.  She had a TEE with negative bubble study and no LA/LAA thrombus detected.  Right heart cath showed normal left and right heart filling pressures with normal cardiac output/index.  LHC showed severe left main disease and patient was transferred to [facility] for CTS consult.  She underwent CABG x 2 in November 2021.  In early 2022 patient had an episode of narrow complex SVT with rates 170 to 180 bpm and highly symptomatic.  She was treated with adenosine.  She was evaluated by Dr. 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  Patient agreed to proceed with EP study and possible ICD implantation.  Repeat echo in March 2022 showed EF 30 to 35%.  She underwent SVT ablation and ICD implantation March 2022.  Patient was last seen in the office by Cadence Furth, PA-C on 04/02/2023 for follow-up after medication changes.  Patient had been seen in April 2024  with complaints of fatigue and carvedilol was decreased.  At the time of her follow-up she reported doing well.  Zetia was added for better lipid control.     History of Present Illness    Today, patient reports she is doing well. Patient denies shortness of breath,  lower extremity edema, orthopnea or PND. She reports dyspnea with heavier exertion such as going up steps. She does  not typically get dyspneic when walking on the treadmill. She does not weigh herself daily at home. She denies abdominal bloating or fullness. No chest pain, pressure, or tightness. No palpitations. She does have occasional dizziness typically when she is sitting or lying down  for a longer period of time. She is currently in LPN school. She is interested in being more consistent with exercise. She is going to contact her insurance to see if she can go to [employer]. She will contact the office if she needs a referral.     ROS: All other systems reviewed and are otherwise negative except as noted in History of Present Illness.  EKGs/Labs Reviewed    EKG Interpretation Date/Time:  Friday October 24 2023 15:20:19 EST Ventricular Rate:  72 PR Interval:  174 QRS Duration:  84 QT Interval:  366 QTC Calculation: 400 R Axis:   -22  Text Interpretation: Normal sinus rhythm Low voltage QRS When compared with ECG of 02-Apr-2023 15:42, T wave inversion no longer evident in Lateral leads Confirmed by Carlos Levering [phone]) on 10/24/2023 3:28:52 PM   12/25/2022: ALT 18; AST 20; BUN 12; Creatinine, Ser 0.74; Potassium 4.4; Sodium 140   12/25/2022: Hemoglobin 15.3; WBC 6.0   Physical Exam    VS:  BP (!) 143/84   Pulse 72   Ht 5\' 6"  (1.676 m)   Wt 204 lb 3.2 oz (92.6 kg)   LMP 11/21/2020   SpO2 98%   BMI 32.96 kg/m  , BMI Body mass index is 32.96 kg/m.  Orthostatic VS for the past 24 hrs (Last 3 readings):  BP- Lying Pulse- Lying BP- Sitting Pulse- Sitting BP- Standing at 0 minutes Pulse- Standing at 0 minutes BP- Standing at 3 minutes Pulse- Standing at 3 minutes  10/24/23 1523 128/82 75 143/87 75 138/87 82 137/84 82     GEN: Well nourished, well developed, in no acute distress. Neck: No JVD or carotid bruits. Cardiac:  RRR. No murmurs. No rubs or gallops.   Respiratory:  Respirations regular and unlabored. Clear to auscultation without rales, wheezing or rhonchi. GI: Soft,  nontender, nondistended. Extremities: Radials/DP/PT 2+ and equal bilaterally. No clubbing or cyanosis. No edema.  Skin: Warm and dry, no rash. Neuro: Strength intact.  Assessment & Plan   CAD S/p CABG x 11 July 2020.  Nuclear stress test May 2023 low risk with no significant ischemia.  Patient denies chest pain, pressure or tightness. EKG demonstrates NSR. She would like to be more consistent with exercise. She is going to look into joining [employer]. She will let me know if she needs a referral.  -Continue aspirin, carvedilol, atorvastatin, Zetia, as needed SL NTG.  Chronic HFrEF/ischemic cardiomyopathy S/p ICD implantation March 2022.  Remote device check December 2024 showed normal device function with stable lead parameters.  Patient denies lower extremity edema, abdominal bloating/fullness, orthopnea or PND. She has noticed dyspnea with heavier exertion such as going up stairs. Not typically dyspneic when walking on the treadmill.  Euvolemic and well compensated on exam. -CBC and CMP today.  -Will update echo.  -  Continue spironolactone, Entresto, Jardiance, carvedilol.  Hypertension/dizziness BP today 143/84. She reports occasional lightheadedness particularly when she is sitting or laying down for long periods of time. She is not orthostatic with orthostatic vitals today.  -Educated on slow position changes.  -Continue Entresto, spironolactone, carvedilol.  Hyperlipidemia LDL April 2024 76, not at goal.  Weldon Picking was added at July follow-up visit. -Continue atorvastatin and Zetia. -Repeat lipid panel with LDL today.   Disposition: Echo. CBC, CMP, Lipid panel with direct LDL today. Return in 6 months or sooner as needed.          Signed, Etta Grandchild. Enma Maeda, DNP, NP-C

## 2023-10-24 ENCOUNTER — Encounter: Payer: Self-pay | Admitting: Student

## 2023-10-24 ENCOUNTER — Ambulatory Visit: Payer: Medicare Other | Attending: Student | Admitting: Student

## 2023-10-24 ENCOUNTER — Other Ambulatory Visit: Payer: Self-pay | Admitting: Internal Medicine

## 2023-10-24 VITALS — BP 143/84 | HR 72 | Ht 66.0 in | Wt 204.2 lb

## 2023-10-24 DIAGNOSIS — I255 Ischemic cardiomyopathy: Secondary | ICD-10-CM | POA: Diagnosis not present

## 2023-10-24 DIAGNOSIS — E785 Hyperlipidemia, unspecified: Secondary | ICD-10-CM

## 2023-10-24 DIAGNOSIS — R0609 Other forms of dyspnea: Secondary | ICD-10-CM | POA: Diagnosis not present

## 2023-10-24 DIAGNOSIS — I5022 Chronic systolic (congestive) heart failure: Secondary | ICD-10-CM | POA: Diagnosis not present

## 2023-10-24 DIAGNOSIS — Z79899 Other long term (current) drug therapy: Secondary | ICD-10-CM

## 2023-10-24 DIAGNOSIS — R42 Dizziness and giddiness: Secondary | ICD-10-CM

## 2023-10-24 DIAGNOSIS — I10 Essential (primary) hypertension: Secondary | ICD-10-CM

## 2023-10-24 NOTE — Patient Instructions (Signed)
Medication Instructions:   Your Physician recommend you continue on your current medication as directed.     *If you need a refill on your cardiac medications before your next appointment, please call your pharmacy*   Lab Work: Your provider would like for you to have following labs drawn today CBC, CMET, Lipid and Direct LDL.   If you have labs (blood work) drawn today and your tests are completely normal, you will receive your results only by: MyChart Message (if you have MyChart) OR A paper copy in the mail If you have any lab test that is abnormal or we need to change your treatment, we will call you to review the results.   Testing/Procedures: Your physician has requested that you have an echocardiogram. Echocardiography is a painless test that uses sound waves to create images of your heart. It provides your doctor with information about the size and shape of your heart and how well your heart's chambers and valves are working.   You may receive an ultrasound enhancing agent through an IV if needed to better visualize your heart during the echo. This procedure takes approximately one hour.  There are no restrictions for this procedure.  This will take place at [address] [facility] [address], [place] [address]  Please note: We ask at that you not bring children with you during ultrasound (echo/ vascular) testing. Due to room size and safety concerns, children are not allowed in the ultrasound rooms during exams. Our front office staff cannot provide observation of children in our lobby area while testing is being conducted. An adult accompanying a patient to their appointment will only be allowed in the ultrasound room at the discretion of the ultrasound technician under special circumstances. We apologize for any inconvenience.    Follow-Up: At [facility], you and your health needs are our priority.  As part of our continuing mission to provide you with  exceptional heart care, we have created designated Provider Care Teams.  These Care Teams include your primary Cardiologist (physician) and Advanced Practice Providers (APPs -  Physician Assistants and Nurse Practitioners) who all work together to provide you with the care you need, when you need it.  We recommend signing up for the patient portal called "MyChart".  Sign up information is provided on this After Visit Summary.  MyChart is used to connect with patients for Virtual Visits (Telemedicine).  Patients are able to view lab/test results, encounter notes, upcoming appointments, etc.  Non-urgent messages can be sent to your provider as well.   To learn more about what you can do with MyChart, go to [email or web address].    Your next appointment:   6 month(s)  Provider:   You may see Yvonne Kendall, MD or one of the following Advanced Practice Providers on your designated Care Team:   Nicolasa Ducking, NP Eula Listen, PA-C Cadence Fransico Michael, PA-C Charlsie Quest, NP Carlos Levering, NP

## 2023-10-25 LAB — LIPID PANEL
Chol/HDL Ratio: 2.6 {ratio} (ref 0.0–4.4)
Cholesterol, Total: 128 mg/dL (ref 100–199)
HDL: 49 mg/dL (ref >39)
LDL Chol Calc (NIH): 64 mg/dL (ref 0–99)
Triglycerides: 72 mg/dL (ref 0–149)
VLDL Cholesterol Cal: 15 mg/dL (ref 5–40)

## 2023-10-25 LAB — COMPREHENSIVE METABOLIC PANEL
ALT: 53 [IU]/L — ABNORMAL HIGH (ref 0–32)
AST: 29 [IU]/L (ref 0–40)
Albumin: 4.3 g/dL (ref 3.8–4.9)
Alkaline Phosphatase: 97 [IU]/L (ref 44–121)
BUN/Creatinine Ratio: 14 (ref 9–23)
BUN: 11 mg/dL (ref 6–24)
Bilirubin Total: 0.5 mg/dL (ref 0.0–1.2)
CO2: 24 mmol/L (ref 20–29)
Calcium: 9.3 mg/dL (ref 8.7–10.2)
Chloride: 105 mmol/L (ref 96–106)
Creatinine, Ser: 0.76 mg/dL (ref 0.57–1.00)
Globulin, Total: 3 g/dL (ref 1.5–4.5)
Glucose: 100 mg/dL — ABNORMAL HIGH (ref 70–99)
Potassium: 4.5 mmol/L (ref 3.5–5.2)
Sodium: 142 mmol/L (ref 134–144)
Total Protein: 7.3 g/dL (ref 6.0–8.5)
eGFR: 92 mL/min/{1.73_m2} (ref >59)

## 2023-10-25 LAB — CBC
Hematocrit: 46 % (ref 34.0–46.6)
Hemoglobin: 14.6 g/dL (ref 11.1–15.9)
MCH: 28.2 pg (ref 26.6–33.0)
MCHC: 31.7 g/dL (ref 31.5–35.7)
MCV: 89 fL (ref 79–97)
Platelets: 356 10*3/uL (ref 150–450)
RBC: 5.17 x10E6/uL (ref 3.77–5.28)
RDW: 12.6 % (ref 11.7–15.4)
WBC: 7 10*3/uL (ref 3.4–10.8)

## 2023-10-25 LAB — LDL CHOLESTEROL, DIRECT: LDL Direct: 64 mg/dL (ref 0–99)

## 2023-10-27 ENCOUNTER — Other Ambulatory Visit (HOSPITAL_COMMUNITY): Payer: Self-pay

## 2023-10-27 ENCOUNTER — Other Ambulatory Visit: Payer: Self-pay

## 2023-10-27 MED ORDER — SACUBITRIL-VALSARTAN 24-26 MG PO TABS
1.0000 | ORAL_TABLET | Freq: Two times a day (BID) | ORAL | 6 refills | Status: DC
  Filled 2023-10-27: qty 60, 30d supply, fill #0
  Filled 2023-11-22: qty 60, 30d supply, fill #1
  Filled 2023-12-22: qty 60, 30d supply, fill #2
  Filled 2024-01-24: qty 60, 30d supply, fill #3
  Filled 2024-02-20: qty 60, 30d supply, fill #4
  Filled 2024-03-21: qty 60, 30d supply, fill #5
  Filled 2024-04-20: qty 60, 30d supply, fill #6

## 2023-10-29 ENCOUNTER — Other Ambulatory Visit: Payer: Self-pay

## 2023-10-29 DIAGNOSIS — Z79899 Other long term (current) drug therapy: Secondary | ICD-10-CM

## 2023-10-30 ENCOUNTER — Ambulatory Visit: Payer: Medicaid Other | Admitting: Medical

## 2023-10-31 ENCOUNTER — Ambulatory Visit: Payer: Medicare Other

## 2023-11-04 ENCOUNTER — Other Ambulatory Visit (INDEPENDENT_AMBULATORY_CARE_PROVIDER_SITE_OTHER): Payer: Self-pay | Admitting: Primary Care

## 2023-11-04 DIAGNOSIS — F32A Depression, unspecified: Secondary | ICD-10-CM

## 2023-11-05 NOTE — Telephone Encounter (Signed)
 Requested medication (s) are due for refill today: yes  Requested medication (s) are on the active medication list: yes  Last refill:  04/02/23  Future visit scheduled: no  Notes to clinic:  Unable to refill per protocol, appointment needed.      Requested Prescriptions  Pending Prescriptions Disp Refills   escitalopram (LEXAPRO) 20 MG tablet 90 tablet 1    Sig: Take 1 tablet (20 mg total) by mouth daily.     Psychiatry:  Antidepressants - SSRI Failed - 11/05/2023  1:53 PM      Failed - Valid encounter within last 6 months    Recent Outpatient Visits           7 months ago Insomnia, unspecified type   [facility] Grayce Sessions, NP   8 months ago Encounter for screening for cervical cancer   [place] [facility] Grayce Sessions, NP   10 months ago Encounter to establish care   [place] [facility] Grayce Sessions, NP

## 2023-11-09 MED ORDER — ESCITALOPRAM OXALATE 20 MG PO TABS
20.0000 mg | ORAL_TABLET | Freq: Every day | ORAL | 1 refills | Status: DC
  Filled 2023-11-09: qty 90, 90d supply, fill #0
  Filled 2024-02-04: qty 90, 90d supply, fill #1

## 2023-11-10 ENCOUNTER — Other Ambulatory Visit (HOSPITAL_COMMUNITY): Payer: Self-pay

## 2023-11-11 ENCOUNTER — Other Ambulatory Visit (HOSPITAL_COMMUNITY): Payer: Self-pay

## 2023-11-21 ENCOUNTER — Ambulatory Visit: Payer: Medicare Other | Attending: Student

## 2023-11-22 ENCOUNTER — Other Ambulatory Visit (HOSPITAL_COMMUNITY): Payer: Self-pay

## 2023-11-23 ENCOUNTER — Other Ambulatory Visit (INDEPENDENT_AMBULATORY_CARE_PROVIDER_SITE_OTHER): Payer: Self-pay | Admitting: Primary Care

## 2023-11-23 DIAGNOSIS — E119 Type 2 diabetes mellitus without complications: Secondary | ICD-10-CM

## 2023-11-23 MED ORDER — OZEMPIC (0.25 OR 0.5 MG/DOSE) 2 MG/3ML ~~LOC~~ SOPN
0.5000 mg | PEN_INJECTOR | SUBCUTANEOUS | 0 refills | Status: DC
  Filled 2023-11-23: qty 3, 28d supply, fill #0

## 2023-11-24 ENCOUNTER — Other Ambulatory Visit (HOSPITAL_COMMUNITY): Payer: Self-pay

## 2023-11-24 ENCOUNTER — Other Ambulatory Visit: Payer: Self-pay

## 2023-12-03 ENCOUNTER — Other Ambulatory Visit (HOSPITAL_COMMUNITY): Payer: Self-pay

## 2023-12-05 ENCOUNTER — Ambulatory Visit (INDEPENDENT_AMBULATORY_CARE_PROVIDER_SITE_OTHER): Payer: Medicaid Other

## 2023-12-05 DIAGNOSIS — I255 Ischemic cardiomyopathy: Secondary | ICD-10-CM

## 2023-12-05 LAB — CUP PACEART REMOTE DEVICE CHECK
Date Time Interrogation Session: 20250328082424
Implantable Lead Connection Status: 753985
Implantable Lead Implant Date: 20220329
Implantable Lead Location: 753860
Implantable Lead Model: 436909
Implantable Lead Serial Number: 81437635
Implantable Pulse Generator Implant Date: 20220329
Pulse Gen Model: 429525
Pulse Gen Serial Number: 84824217

## 2023-12-06 ENCOUNTER — Encounter: Payer: Self-pay | Admitting: Cardiology

## 2023-12-09 ENCOUNTER — Other Ambulatory Visit (HOSPITAL_COMMUNITY): Payer: Self-pay

## 2023-12-10 ENCOUNTER — Telehealth (INDEPENDENT_AMBULATORY_CARE_PROVIDER_SITE_OTHER): Payer: Self-pay

## 2023-12-10 NOTE — Telephone Encounter (Signed)
 Copied from CRM [id number]. Topic: Clinical - Medical Advice >> Dec 05, 2023  1:52 PM [name] wrote: Reason for CRM:  Stacey Richardson with [facility] called to say that they have a team that meets with patients and have a group and whatever issues that they have they can speak with the team. Dr Randa Evens is invited to review the meeting to view the careplan.   Site to review ability.

## 2023-12-11 ENCOUNTER — Telehealth (INDEPENDENT_AMBULATORY_CARE_PROVIDER_SITE_OTHER): Payer: Self-pay | Admitting: Primary Care

## 2023-12-11 NOTE — Telephone Encounter (Signed)
 Called pt to confirm appt. Pt did not answer and VM was left for pt.

## 2023-12-14 ENCOUNTER — Encounter (INDEPENDENT_AMBULATORY_CARE_PROVIDER_SITE_OTHER): Payer: Self-pay | Admitting: Primary Care

## 2023-12-18 ENCOUNTER — Telehealth (INDEPENDENT_AMBULATORY_CARE_PROVIDER_SITE_OTHER): Payer: Self-pay | Admitting: Primary Care

## 2023-12-18 ENCOUNTER — Ambulatory Visit (INDEPENDENT_AMBULATORY_CARE_PROVIDER_SITE_OTHER): Admitting: Primary Care

## 2023-12-18 NOTE — Telephone Encounter (Signed)
 Called pt about upcoming appt. Pt did not answer and VM was left.

## 2023-12-19 ENCOUNTER — Encounter (INDEPENDENT_AMBULATORY_CARE_PROVIDER_SITE_OTHER): Payer: Self-pay

## 2023-12-19 ENCOUNTER — Ambulatory Visit: Attending: Student

## 2023-12-19 ENCOUNTER — Telehealth (INDEPENDENT_AMBULATORY_CARE_PROVIDER_SITE_OTHER): Payer: Self-pay | Admitting: Primary Care

## 2023-12-19 ENCOUNTER — Ambulatory Visit (INDEPENDENT_AMBULATORY_CARE_PROVIDER_SITE_OTHER): Admitting: Primary Care

## 2023-12-19 DIAGNOSIS — R0609 Other forms of dyspnea: Secondary | ICD-10-CM

## 2023-12-19 LAB — ECHOCARDIOGRAM COMPLETE
AR max vel: 2.88 cm2
AV Area VTI: 2.86 cm2
AV Area mean vel: 2.79 cm2
AV Mean grad: 3 mmHg
AV Peak grad: 5.6 mmHg
Ao pk vel: 1.18 m/s
Area-P 1/2: 4.6 cm2
S' Lateral: 3.6 cm

## 2023-12-19 MED ORDER — PERFLUTREN LIPID MICROSPHERE
1.0000 mL | INTRAVENOUS | Status: AC | PRN
  Administered 2023-12-19: 2 mL via INTRAVENOUS

## 2023-12-19 NOTE — Telephone Encounter (Signed)
 Called pt to confirm appt. Pt did not answer and VM was left about upcoming appt or switching to virtual so that appt is not missed.

## 2023-12-22 ENCOUNTER — Other Ambulatory Visit (HOSPITAL_COMMUNITY): Payer: Self-pay

## 2023-12-25 ENCOUNTER — Other Ambulatory Visit: Payer: Self-pay

## 2023-12-28 ENCOUNTER — Other Ambulatory Visit (INDEPENDENT_AMBULATORY_CARE_PROVIDER_SITE_OTHER): Payer: Self-pay | Admitting: Primary Care

## 2023-12-28 DIAGNOSIS — E119 Type 2 diabetes mellitus without complications: Secondary | ICD-10-CM

## 2023-12-29 ENCOUNTER — Other Ambulatory Visit (HOSPITAL_COMMUNITY): Payer: Self-pay

## 2023-12-29 DIAGNOSIS — Z0100 Encounter for examination of eyes and vision without abnormal findings: Secondary | ICD-10-CM | POA: Diagnosis not present

## 2023-12-29 LAB — HM DIABETES EYE EXAM

## 2023-12-29 NOTE — Telephone Encounter (Signed)
 Requested medication (s) are due for refill today: yes  Requested medication (s) are on the active medication list: yes  Last refill:  11/23/23 3 ml  Future visit scheduled: yes  Notes to clinic:  pt's last office visit: 04/02/23; overdue for visit, lab work    Requested Prescriptions  Pending Prescriptions Disp Refills   OZEMPIC , 0.25 OR 0.5 MG/DOSE, 2 MG/3ML SOPN 3 mL 0    Sig: Inject 0.5 mg into the skin once a week.     Endocrinology:  Diabetes - GLP-1 Receptor Agonists - semaglutide  Failed - 12/29/2023  4:00 PM      Failed - HBA1C in normal range and within 180 days    HbA1c, POC (controlled diabetic range)  Date Value Ref Range Status  12/25/2022 6.6 0.0 - 7.0 % Final         Failed - Valid encounter within last 6 months    Recent Outpatient Visits           9 months ago Insomnia, unspecified type   [place] [facility] Marius Siemens, NP   10 months ago Encounter for screening for cervical cancer   [place] [facility] Marius Siemens, NP   1 year ago Encounter to establish care   [place] [facility] Marius Siemens, NP              Passed - Cr in normal range and within 360 days    Creatinine, Ser  Date Value Ref Range Status  10/24/2023 0.76 0.57 - 1.00 mg/dL Final

## 2023-12-29 NOTE — Telephone Encounter (Signed)
Called pt and appt made 

## 2023-12-30 ENCOUNTER — Other Ambulatory Visit (HOSPITAL_COMMUNITY): Payer: Self-pay

## 2024-01-01 ENCOUNTER — Other Ambulatory Visit (INDEPENDENT_AMBULATORY_CARE_PROVIDER_SITE_OTHER): Payer: Self-pay | Admitting: Primary Care

## 2024-01-01 DIAGNOSIS — E119 Type 2 diabetes mellitus without complications: Secondary | ICD-10-CM

## 2024-01-02 NOTE — Telephone Encounter (Signed)
 Will forward to provider

## 2024-01-04 ENCOUNTER — Other Ambulatory Visit (INDEPENDENT_AMBULATORY_CARE_PROVIDER_SITE_OTHER): Payer: Self-pay | Admitting: Primary Care

## 2024-01-04 DIAGNOSIS — E119 Type 2 diabetes mellitus without complications: Secondary | ICD-10-CM

## 2024-01-06 ENCOUNTER — Telehealth (INDEPENDENT_AMBULATORY_CARE_PROVIDER_SITE_OTHER): Payer: Self-pay | Admitting: Primary Care

## 2024-01-06 NOTE — Telephone Encounter (Signed)
 Called pt to confirm appt. Pt will be present.

## 2024-01-07 ENCOUNTER — Other Ambulatory Visit (HOSPITAL_COMMUNITY): Payer: Self-pay

## 2024-01-08 ENCOUNTER — Other Ambulatory Visit (INDEPENDENT_AMBULATORY_CARE_PROVIDER_SITE_OTHER): Payer: Self-pay | Admitting: Primary Care

## 2024-01-08 ENCOUNTER — Other Ambulatory Visit: Payer: Self-pay

## 2024-01-08 ENCOUNTER — Other Ambulatory Visit (HOSPITAL_COMMUNITY): Payer: Self-pay

## 2024-01-08 ENCOUNTER — Other Ambulatory Visit (INDEPENDENT_AMBULATORY_CARE_PROVIDER_SITE_OTHER): Payer: Self-pay | Admitting: Internal Medicine

## 2024-01-08 DIAGNOSIS — E119 Type 2 diabetes mellitus without complications: Secondary | ICD-10-CM

## 2024-01-08 MED ORDER — CARVEDILOL 3.125 MG PO TABS
3.1250 mg | ORAL_TABLET | Freq: Two times a day (BID) | ORAL | 0 refills | Status: DC
  Filled 2024-01-08: qty 180, 90d supply, fill #0

## 2024-01-09 ENCOUNTER — Other Ambulatory Visit (HOSPITAL_COMMUNITY): Payer: Self-pay

## 2024-01-13 NOTE — Progress Notes (Signed)
 Remote ICD transmission.

## 2024-01-14 ENCOUNTER — Encounter (INDEPENDENT_AMBULATORY_CARE_PROVIDER_SITE_OTHER): Payer: Self-pay | Admitting: Primary Care

## 2024-01-14 ENCOUNTER — Ambulatory Visit (INDEPENDENT_AMBULATORY_CARE_PROVIDER_SITE_OTHER): Admitting: Primary Care

## 2024-01-14 VITALS — BP 105/72 | HR 87 | Resp 16 | Ht 66.0 in | Wt 204.8 lb

## 2024-01-14 DIAGNOSIS — Z7985 Long-term (current) use of injectable non-insulin antidiabetic drugs: Secondary | ICD-10-CM | POA: Diagnosis not present

## 2024-01-14 DIAGNOSIS — E119 Type 2 diabetes mellitus without complications: Secondary | ICD-10-CM

## 2024-01-14 DIAGNOSIS — Z23 Encounter for immunization: Secondary | ICD-10-CM

## 2024-01-14 LAB — POCT GLYCOSYLATED HEMOGLOBIN (HGB A1C): HbA1c, POC (controlled diabetic range): 7.9 % — AB (ref 0.0–7.0)

## 2024-01-14 NOTE — Progress Notes (Signed)
 Subjective:  Patient ID: Stacey Richardson, female    DOB: [birth year]  Age: 58 y.o. MRN: [id number]  CC: Medication Refill (Ozempic , but pt states its not really working ) and Diabetes   Stacey Richardson presents forFollow-up of diabetes. Patient does not check blood sugar at home Medication Refill  Diabetes  Vision problems - No  Medications as noted below. Taking them regularly without complication/adverse reaction being reported today.   History Avrielle has a past medical history of CAD (coronary artery disease), CVA (cerebral vascular accident) (HCC), Diabetes mellitus, HFrEF (heart failure with reduced ejection fraction) (HCC), HLD (hyperlipidemia), Hypertension, and SVT (supraventricular tachycardia) (HCC).   She has a past surgical history that includes Knee surgery; TEE without cardioversion (N/A, 07/18/2020); RIGHT/LEFT HEART CATH AND CORONARY ANGIOGRAPHY (Bilateral, 08/02/2020); Coronary artery bypass graft (N/A, 08/06/2020); TEE without cardioversion (N/A, 08/06/2020); Clipping of atrial appendage (N/A, 08/06/2020); Cardiac catheterization; SVT ABLATION (N/A, 12/05/2020); ICD IMPLANT (N/A, 12/05/2020); and Colonoscopy with propofol  (N/A, 09/14/2021).   Her family history includes Breast cancer in her niece; Heart disease in her mother; Heart failure in her brother; Hypertension in her brother.She reports that she has quit smoking. She has never used smokeless tobacco. She reports current alcohol use. She reports that she does not use drugs.  Current Outpatient Medications on File Prior to Visit  Medication Sig Dispense Refill   acetaminophen  (TYLENOL ) 500 MG tablet Take 1 tablet (500 mg total) by mouth every 6 (six) hours as needed. 30 tablet 0   alum & mag hydroxide-simeth (MAALOX ADVANCED MAX ST) 400-400-40 MG/5ML suspension Take 15 mLs by mouth every 6 (six) hours as needed for indigestion. 355 mL 0   aspirin  EC 81 MG tablet Take 81 mg by mouth daily.      atorvastatin  (LIPITOR ) 80  MG tablet Take 1 tablet (80 mg total) by mouth daily. 180 tablet 3   carvedilol  (COREG ) 3.125 MG tablet Take 1 tablet (3.125 mg total) by mouth 2 (two) times daily. 180 tablet 0   escitalopram  (LEXAPRO ) 20 MG tablet Take 1 tablet (20 mg total) by mouth daily. 90 tablet 1   ezetimibe  (ZETIA ) 10 MG tablet Take 1 tablet (10 mg total) by mouth daily. 90 tablet 3   nitroGLYCERIN  (NITROSTAT ) 0.4 MG SL tablet Place 1 tablet (0.4 mg total) under the tongue every 5 (five) minutes as needed for chest pain. 25 tablet 3   polyethylene glycol powder (GLYCOLAX /MIRALAX ) 17 GM/SCOOP powder Take 17 g by mouth as needed for moderate constipation.     sacubitril -valsartan  (ENTRESTO ) 24-26 MG Take 1 tablet by mouth 2 (two) times daily. 60 tablet 6   spironolactone  (ALDACTONE ) 25 MG tablet Take 0.5 tablets (12.5 mg total) by mouth daily. 45 tablet 1   tetrahydrozoline 0.05 % ophthalmic solution Place 1-2 drops into both eyes daily as needed (irritated eyes.). visine 15 mL 1   No current facility-administered medications on file prior to visit.    Review of Systems Comprehensive ROS Pertinent positive and negative noted in HPI   Objective:  BP 105/72   Pulse 87   Resp 16   Ht 5\' 6"  (1.676 m)   Wt 204 lb 12.8 oz (92.9 kg)   LMP 11/21/2020   SpO2 96%   BMI 33.06 kg/m   BP Readings from Last 3 Encounters:  01/14/24 105/72  10/24/23 (!) 143/84  04/02/23 114/60    Wt Readings from Last 3 Encounters:  01/14/24 204 lb 12.8 oz (92.9 kg)  10/24/23 204 lb  3.2 oz (92.6 kg)  04/02/23 202 lb 9.6 oz (91.9 kg)   Lab Results  Component Value Date   HGBA1C 7.9 (A) 01/14/2024   HGBA1C 6.6 12/25/2022   HGBA1C 6.5 (H) 12/11/2020   Physical Exam Vitals reviewed.  Constitutional:      Appearance: Normal appearance. She is obese.  HENT:     Head: Normocephalic.     Right Ear: Tympanic membrane, ear canal and external ear normal.     Left Ear: Tympanic membrane, ear canal and external ear normal.     Nose:  Nose normal.     Mouth/Throat:     Mouth: Mucous membranes are moist.  Eyes:     Extraocular Movements: Extraocular movements intact.     Pupils: Pupils are equal, round, and reactive to light.  Cardiovascular:     Rate and Rhythm: Normal rate.  Pulmonary:     Effort: Pulmonary effort is normal.     Breath sounds: Normal breath sounds.  Abdominal:     General: Bowel sounds are normal.     Palpations: Abdomen is soft.  Musculoskeletal:        General: Normal range of motion.     Cervical back: Normal range of motion.  Skin:    General: Skin is warm and dry.  Neurological:     Mental Status: She is alert and oriented to person, place, and time.  Psychiatric:        Mood and Affect: Mood normal.        Behavior: Behavior normal.        Thought Content: Thought content normal.    Lab Results  Component Value Date   HGBA1C 7.9 (A) 01/14/2024   HGBA1C 6.6 12/25/2022   HGBA1C 6.5 (H) 12/11/2020    Lab Results  Component Value Date   WBC 7.0 10/24/2023   HGB 14.6 10/24/2023   HCT 46.0 10/24/2023   PLT 356 10/24/2023   GLUCOSE 100 (H) 10/24/2023   CHOL 128 10/24/2023   TRIG 72 10/24/2023   HDL 49 10/24/2023   LDLDIRECT 64 10/24/2023   LDLCALC 64 10/24/2023   ALT 53 (H) 10/24/2023   AST 29 10/24/2023   NA 142 10/24/2023   K 4.5 10/24/2023   CL 105 10/24/2023   CREATININE 0.76 10/24/2023   BUN 11 10/24/2023   CO2 24 10/24/2023   TSH 1.251 06/12/2020   INR 1.4 (H) 08/06/2020   HGBA1C 7.9 (A) 01/14/2024     Assessment & Plan:  Major was seen today for medication refill and diabetes.  Diagnoses and all orders for this visit:  Type 2 diabetes mellitus without complication, without long-term current use of insulin  (HCC) -     POCT glycosylated hemoglobin (Hb A1C) increased in 1 year from 6.6 to 7.9  - educated on lifestyle modifications, including but not limited to diet choices and adding exercise to daily routine.   -     Microalbumin / creatinine urine ratio   Increase Semaglutide  1mg  weekly   Encounter for immunization -     Pneumococcal conjugate vaccine 20-valent    Follow-up:  F/u with [facility] and 3 months PCP  The above assessment and management plan was discussed with the patient. The patient verbalized understanding of and has agreed to the management plan. Patient is aware to call the clinic if symptoms fail to improve or worsen. Patient is aware when to return to the clinic for a follow-up visit. Patient educated on when it is appropriate to  go to the emergency department.   Madelyn Schick, NP-C

## 2024-01-14 NOTE — Patient Instructions (Addendum)
 Obesity, Adult Obesity is having too much body fat. Being obese means that your weight is more than what is healthy for you.  BMI (body mass index) is a number that explains how much body fat you have. If you have a BMI of 30 or more, you are obese. Obesity can cause serious health problems, such as: Stroke. Coronary artery disease (CAD). Type 2 diabetes. Some types of cancer. High blood pressure (hypertension). High cholesterol. Gallbladder stones. Obesity can also contribute to: Osteoarthritis. Sleep apnea. Infertility problems. What are the causes? Eating meals each day that are high in calories, sugar, and fat. Drinking a lot of drinks that have sugar in them. Being born with genes that may make you more likely to become obese. Having a medical condition that causes obesity. Taking certain medicines. Sitting a lot (having a sedentary lifestyle). Not getting enough sleep. What increases the risk? Having a family history of obesity. Living in an area with limited access to: [place], recreation centers, or sidewalks. Healthy food choices, such as grocery stores and farmers' markets. What are the signs or symptoms? The main sign is having too much body fat. How is this treated? Treatment for this condition often includes changing your lifestyle. Treatment may include: Changing your diet. This may include making a healthy meal plan. Exercise. This may include activity that causes your heart to beat faster (aerobic exercise) and strength training. Work with your doctor to design a program that works for you. Medicine to help you lose weight. This may be used if you are not able to lose one pound a week after 6 weeks of healthy eating and more exercise. Treating conditions that cause the obesity. Surgery. Options may include gastric banding and gastric bypass. This may be done if: Other treatments have not helped to improve your condition. You have a BMI of 40 or higher. You have  life-threatening health problems related to obesity. Follow these instructions at home: Eating and drinking  Follow advice from your doctor about what to eat and drink. Your doctor may tell you to: Limit fast food, sweets, and processed snack foods. Choose low-fat options. For example, choose low-fat milk instead of whole milk. Eat five or more servings of fruits or vegetables each day. Eat at home more often. This gives you more control over what you eat. Choose healthy foods when you eat out. Learn to read food labels. This will help you learn how much food is in one serving. Keep low-fat snacks available. Avoid drinks that have a lot of sugar in them. These include soda, fruit juice, iced tea with sugar, and flavored milk. Drink enough water  to keep your pee (urine) pale yellow. Do not go on fad diets. Physical activity Exercise often, as told by your doctor. Most adults should get up to 150 minutes of moderate-intensity exercise every week.Ask your doctor: What types of exercise are safe for you. How often you should exercise. Warm up and stretch before being active. Do slow stretching after being active (cool down). Rest between times of being active. Lifestyle Work with your doctor and a food expert (dietitian) to set a weight-loss goal that is best for you. Limit your screen time. Find ways to reward yourself that do not involve food. Do not drink alcohol if: Your doctor tells you not to drink. You are pregnant, may be pregnant, or are planning to become pregnant. If you drink alcohol: Limit how much you have to: 0-1 drink a day for women. 0-2 drinks  a day for men. Know how much alcohol is in your drink. In the [place] one drink equals one 12 oz bottle of beer (355 mL), one 5 oz glass of wine (148 mL), or one 1 oz glass of hard liquor (44 mL). General instructions Keep a weight-loss journal. This can help you keep track of: The food that you eat. How much exercise you  get. Take over-the-counter and prescription medicines only as told by your doctor. Take vitamins and supplements only as told by your doctor. Think about joining a support group. Pay attention to your mental health as obesity can lead to depression or self esteem issues. Keep all follow-up visits. Contact a doctor if: You cannot meet your weight-loss goal after you have changed your diet and lifestyle for 6 weeks. You are having trouble breathing. Summary Obesity is having too much body fat. Being obese means that your weight is more than what is healthy for you. Work with your doctor to set a weight-loss goal. Get regular exercise as told by your doctor. This information is not intended to replace advice given to you by your health care provider. Make sure you discuss any questions you have with your health care provider. Document Revised: 04/03/2021 Document Reviewed: 04/03/2021 Elsevier Patient Education  2024 Elsevier Inc.   Pneumococcal Conjugate Vaccine: What You Need to Know Many vaccine information statements are available in Spanish and other languages. See PromoAge.com.br. 1. Why get vaccinated? Pneumococcal conjugate vaccine can prevent pneumococcal disease. Pneumococcal disease refers to any illness caused by pneumococcal bacteria. These bacteria can cause many types of illnesses, including pneumonia, which is an infection of the lungs. Pneumococcal bacteria are one of the most common causes of pneumonia. Besides pneumonia, pneumococcal bacteria can also cause: Ear infections Sinus infections Meningitis (infection of the tissue covering the brain and spinal cord) Bacteremia (infection of the blood) Anyone can get pneumococcal disease, but children under 25 years old, people with certain medical conditions or other risk factors, and adults 65 years or older are at the highest risk. Most pneumococcal infections are mild. However, some can result in long-term problems,  such as brain damage or hearing loss. Meningitis, bacteremia, and pneumonia caused by pneumococcal disease can be fatal. 2. Pneumococcal conjugate vaccine Pneumococcal conjugate vaccine helps protect against bacteria that cause pneumococcal disease. There are three pneumococcal conjugate vaccines (PCV13, PCV15, and PCV20). The different vaccines are recommended for different people based on age and medical status. Your health care provider can help you determine which type of pneumococcal conjugate vaccine, and how many doses, you should receive. Infants and young children usually need 4 doses of pneumococcal conjugate vaccine. These doses are recommended at 2, 4, 6, and 70-2 months of age. Older children and adolescents might need pneumococcal conjugate vaccine depending on their age and medical conditions or other risk factors if they did not receive the recommended doses as infants or young children. Adults 19 through 38 years old with certain medical conditions or other risk factors who have not already received pneumococcal conjugate vaccine should receive pneumococcal conjugate vaccine. Adults 65 years or older who have not previously received pneumococcal conjugate vaccine should receive pneumococcal conjugate vaccine. Some people with certain medical conditions are also recommended to receive pneumococcal polysaccharide vaccine (a different type of pneumococcal vaccine known as PPSV23). Some adults who have previously received a pneumococcal conjugate vaccine may be recommended to receive another pneumococcal conjugate vaccine. 3. Talk with your health care provider Tell your vaccination provider if  the person getting the vaccine: Has had an allergic reaction after a previous dose of any type of pneumococcal conjugate vaccine (PCV13, PCV15, PCV20, or an earlier pneumococcal conjugate vaccine known as PCV7), or to any vaccine containing diphtheria toxoid (for example, DTaP), or has any severe,  life-threatening allergies In some cases, your health care provider may decide to postpone pneumococcal conjugate vaccination until a future visit. People with minor illnesses, such as a cold, may be vaccinated. People who are moderately or severely ill should usually wait until they recover. Your health care provider can give you more information. 4. Risks of a vaccine reaction Redness, swelling, pain, or tenderness where the shot is given, and fever, loss of appetite, fussiness (irritability), feeling tired, headache, muscle aches, joint pain, and chills can happen after pneumococcal conjugate vaccination. Young children may be at increased risk for seizures caused by fever after a pneumococcal conjugate vaccine if it is administered at the same time as inactivated influenza vaccine. Ask your health care provider for more information. People sometimes faint after medical procedures, including vaccination. Tell your provider if you feel dizzy or have vision changes or ringing in the ears. As with any medicine, there is a very remote chance of a vaccine causing a severe allergic reaction, other serious injury, or death. 5. What if there is a serious problem? An allergic reaction could occur after the vaccinated person leaves the clinic. If you see signs of a severe allergic reaction (hives, swelling of the face and throat, difficulty breathing, a fast heartbeat, dizziness, or weakness), call 9-1-1 and get the person to the nearest hospital. For other signs that concern you, call your health care provider. Adverse reactions should be reported to the Vaccine Adverse Event Reporting System (VAERS). Your health care provider will usually file this report, or you can do it yourself. Visit the VAERS website at www.vaers.LAgents.no or call 4127593304. VAERS is only for reporting reactions, and VAERS staff members do not give medical advice. 6. The National Vaccine Injury Compensation Program The [employer]  Vaccine Injury Compensation Program (VICP) is a federal program that was created to compensate people who may have been injured by certain vaccines. Claims regarding alleged injury or death due to vaccination have a time limit for filing, which may be as short as two years. Visit the VICP website at SpiritualWord.at or call (289)517-8875 to learn about the program and about filing a claim. 7. How can I learn more? Ask your health care provider. Call your local or state health department. Visit the website of the Food and Drug Administration (FDA) for vaccine package inserts and additional information at FinderList.no. Contact the Centers for Disease Control and Prevention (CDC): Call (612)239-5815 (1-800-CDC-INFO) or Visit CDC's website at PicCapture.uy. Source: CDC Vaccine Information Statement (Interim) Pneumococcal Conjugate Vaccine (01/18/2022) This same material is available at FootballExhibition.com.br for no charge. This information is not intended to replace advice given to you by your health care provider. Make sure you discuss any questions you have with your health care provider. Document Revised: 12/11/2022 Document Reviewed: 09/16/2022 Elsevier Patient Education  2024 [employer].

## 2024-01-15 LAB — MICROALBUMIN / CREATININE URINE RATIO
Creatinine, Urine: 29.2 mg/dL
Microalb/Creat Ratio: <10 mg/g{creat} (ref 0–29)
Microalbumin, Urine: <3 ug/mL

## 2024-01-17 ENCOUNTER — Other Ambulatory Visit (INDEPENDENT_AMBULATORY_CARE_PROVIDER_SITE_OTHER): Payer: Self-pay | Admitting: Primary Care

## 2024-01-17 ENCOUNTER — Encounter (INDEPENDENT_AMBULATORY_CARE_PROVIDER_SITE_OTHER): Payer: Self-pay | Admitting: Primary Care

## 2024-01-17 DIAGNOSIS — E119 Type 2 diabetes mellitus without complications: Secondary | ICD-10-CM

## 2024-01-17 MED ORDER — JARDIANCE 10 MG PO TABS
10.0000 mg | ORAL_TABLET | Freq: Every day | ORAL | 3 refills | Status: AC
  Filled 2024-01-17 – 2024-02-05 (×3): qty 90, 90d supply, fill #0
  Filled 2024-05-02: qty 90, 90d supply, fill #1
  Filled 2024-07-31: qty 30, 30d supply, fill #2
  Filled 2024-08-13 – 2024-08-20 (×3): qty 90, 90d supply, fill #2

## 2024-01-17 MED ORDER — SEMAGLUTIDE (1 MG/DOSE) 4 MG/3ML ~~LOC~~ SOPN
1.0000 mg | PEN_INJECTOR | SUBCUTANEOUS | 1 refills | Status: DC
  Filled 2024-01-17: qty 3, 28d supply, fill #0
  Filled 2024-02-11: qty 3, 28d supply, fill #1

## 2024-01-18 ENCOUNTER — Other Ambulatory Visit (HOSPITAL_COMMUNITY): Payer: Self-pay

## 2024-01-19 ENCOUNTER — Other Ambulatory Visit: Payer: Self-pay

## 2024-01-19 ENCOUNTER — Other Ambulatory Visit (HOSPITAL_COMMUNITY): Payer: Self-pay

## 2024-01-19 ENCOUNTER — Encounter (INDEPENDENT_AMBULATORY_CARE_PROVIDER_SITE_OTHER): Admitting: Primary Care

## 2024-01-19 ENCOUNTER — Encounter (INDEPENDENT_AMBULATORY_CARE_PROVIDER_SITE_OTHER): Payer: Self-pay

## 2024-01-20 ENCOUNTER — Telehealth (INDEPENDENT_AMBULATORY_CARE_PROVIDER_SITE_OTHER): Payer: Self-pay | Admitting: Primary Care

## 2024-01-20 NOTE — Telephone Encounter (Signed)
 Called pt to schedule an appt with [facility]. Pt did not answer and the phone number was unavailable.

## 2024-01-26 ENCOUNTER — Other Ambulatory Visit (HOSPITAL_COMMUNITY): Payer: Self-pay

## 2024-01-27 ENCOUNTER — Other Ambulatory Visit (HOSPITAL_COMMUNITY): Payer: Self-pay

## 2024-02-04 ENCOUNTER — Other Ambulatory Visit: Payer: Self-pay

## 2024-02-04 ENCOUNTER — Other Ambulatory Visit (HOSPITAL_COMMUNITY): Payer: Self-pay

## 2024-02-05 ENCOUNTER — Other Ambulatory Visit (HOSPITAL_COMMUNITY): Payer: Self-pay

## 2024-02-05 ENCOUNTER — Other Ambulatory Visit: Payer: Self-pay

## 2024-02-11 ENCOUNTER — Other Ambulatory Visit (HOSPITAL_COMMUNITY): Payer: Self-pay

## 2024-02-13 ENCOUNTER — Telehealth (INDEPENDENT_AMBULATORY_CARE_PROVIDER_SITE_OTHER): Payer: Self-pay

## 2024-02-13 NOTE — Telephone Encounter (Signed)
 Copied from CRM [id number]. Topic: Clinical - Medication Question >> Feb 13, 2024  3:26 PM Sophia H wrote: Reason for CRM: Patient is wanting to know if she can take vitamins with her condition.   Women's 1 a day vitamin Fish oil    Please advise, best contact # [phone]

## 2024-02-16 ENCOUNTER — Encounter: Payer: Self-pay | Admitting: Internal Medicine

## 2024-02-17 NOTE — Telephone Encounter (Signed)
 Will forward to provider

## 2024-02-19 NOTE — Telephone Encounter (Signed)
 Will forward to provider

## 2024-02-20 ENCOUNTER — Other Ambulatory Visit (HOSPITAL_COMMUNITY): Payer: Self-pay

## 2024-02-26 ENCOUNTER — Other Ambulatory Visit (HOSPITAL_COMMUNITY): Payer: Self-pay

## 2024-03-01 NOTE — Telephone Encounter (Signed)
 Will forward to provider

## 2024-03-04 NOTE — Telephone Encounter (Signed)
 Will forward to provider

## 2024-03-05 ENCOUNTER — Ambulatory Visit: Payer: Self-pay | Admitting: Cardiology

## 2024-03-05 ENCOUNTER — Ambulatory Visit (INDEPENDENT_AMBULATORY_CARE_PROVIDER_SITE_OTHER): Payer: Medicaid Other

## 2024-03-05 DIAGNOSIS — I255 Ischemic cardiomyopathy: Secondary | ICD-10-CM

## 2024-03-05 LAB — CUP PACEART REMOTE DEVICE CHECK
Date Time Interrogation Session: 20250627085325
Implantable Lead Connection Status: 753985
Implantable Lead Implant Date: 20220329
Implantable Lead Location: 753860
Implantable Lead Model: 436909
Implantable Lead Serial Number: 81437635
Implantable Pulse Generator Implant Date: 20220329
Pulse Gen Model: 429525
Pulse Gen Serial Number: 84824217

## 2024-03-13 ENCOUNTER — Other Ambulatory Visit (INDEPENDENT_AMBULATORY_CARE_PROVIDER_SITE_OTHER): Payer: Self-pay | Admitting: Primary Care

## 2024-03-16 NOTE — Telephone Encounter (Signed)
 Requested medication (s) are due for refill today: yes  Requested medication (s) are on the active medication list: yes  Last refill:  01/17/24 3 ml 1 RF  Future visit scheduled: no  Notes to clinic:  is this drug being titrated? Last Hgb A1C 7.9 %   Requested Prescriptions  Pending Prescriptions Disp Refills   Semaglutide , 1 MG/DOSE, (OZEMPIC , 1 MG/DOSE,) 4 MG/3ML SOPN 3 mL 1    Sig: Inject 1 mg as directed once a week.     Endocrinology:  Diabetes - GLP-1 Receptor Agonists - semaglutide  Failed - 03/16/2024  2:23 PM      Failed - HBA1C in normal range and within 180 days    HbA1c, POC (controlled diabetic range)  Date Value Ref Range Status  01/14/2024 7.9 (A) 0.0 - 7.0 % Final         Passed - Cr in normal range and within 360 days    Creatinine, Ser  Date Value Ref Range Status  10/24/2023 0.76 0.57 - 1.00 mg/dL Final         Passed - Valid encounter within last 6 months    Recent Outpatient Visits           2 months ago Type 2 diabetes mellitus without complication, without long-term current use of insulin  (HCC)   [place] [facility] [name], NP   11 months ago Insomnia, unspecified type   [place] [facility] [name], NP   1 year ago Encounter for screening for cervical cancer   [place] [facility] [name], NP   1 year ago Encounter to establish care    [facility] [name], NP       Future Appointments             In 1 month Wittenborn, Barnie, NP [facility]

## 2024-03-22 ENCOUNTER — Other Ambulatory Visit: Payer: Self-pay

## 2024-03-22 ENCOUNTER — Telehealth (INDEPENDENT_AMBULATORY_CARE_PROVIDER_SITE_OTHER): Payer: Self-pay | Admitting: Primary Care

## 2024-03-22 NOTE — Telephone Encounter (Unsigned)
 Copied from CRM [id number]. Topic: Clinical - Medication Refill >> Mar 22, 2024  3:33 PM Elle L wrote: Medication: Semaglutide , 1 MG/DOSE, 4 MG/3ML SOPN  Has the patient contacted their pharmacy? Yes  This is the patient's preferred pharmacy:  [facility] [address] [place] [address] Phone: [phone] Fax: [phone]  Is this the correct pharmacy for this prescription? Yes  Has the prescription been filled recently? Yes  Is the patient out of the medication? Yes  Has the patient been seen for an appointment in the last year OR does the patient have an upcoming appointment? Yes  Can we respond through MyChart? Yes  Agent: Please be advised that Rx refills may take up to 3 business days. We ask that you follow-up with your pharmacy.

## 2024-03-23 MED ORDER — OZEMPIC (1 MG/DOSE) 4 MG/3ML ~~LOC~~ SOPN
1.0000 mg | PEN_INJECTOR | SUBCUTANEOUS | 1 refills | Status: DC
  Filled 2024-03-23: qty 3, 28d supply, fill #0
  Filled 2024-04-11: qty 3, 28d supply, fill #1

## 2024-03-23 NOTE — Telephone Encounter (Signed)
 Will forward to provider

## 2024-03-24 ENCOUNTER — Other Ambulatory Visit (HOSPITAL_COMMUNITY): Payer: Self-pay

## 2024-03-24 ENCOUNTER — Other Ambulatory Visit: Payer: Self-pay

## 2024-03-31 ENCOUNTER — Other Ambulatory Visit (HOSPITAL_COMMUNITY): Payer: Self-pay

## 2024-03-31 ENCOUNTER — Other Ambulatory Visit: Payer: Self-pay | Admitting: Medical

## 2024-04-01 ENCOUNTER — Other Ambulatory Visit (HOSPITAL_COMMUNITY): Payer: Self-pay

## 2024-04-01 ENCOUNTER — Other Ambulatory Visit: Payer: Self-pay

## 2024-04-01 MED ORDER — EZETIMIBE 10 MG PO TABS
10.0000 mg | ORAL_TABLET | Freq: Every day | ORAL | 2 refills | Status: AC
  Filled 2024-04-01: qty 90, 90d supply, fill #0
  Filled 2024-04-04 – 2024-04-11 (×3): qty 90, 90d supply, fill #1
  Filled 2024-07-11 – 2024-08-02 (×2): qty 90, 90d supply, fill #2

## 2024-04-02 ENCOUNTER — Other Ambulatory Visit: Payer: Self-pay | Admitting: Medical

## 2024-04-02 ENCOUNTER — Other Ambulatory Visit: Payer: Self-pay

## 2024-04-02 ENCOUNTER — Other Ambulatory Visit (HOSPITAL_COMMUNITY): Payer: Self-pay

## 2024-04-02 DIAGNOSIS — I10 Essential (primary) hypertension: Secondary | ICD-10-CM

## 2024-04-02 DIAGNOSIS — I5022 Chronic systolic (congestive) heart failure: Secondary | ICD-10-CM

## 2024-04-02 MED ORDER — SPIRONOLACTONE 25 MG PO TABS
12.5000 mg | ORAL_TABLET | Freq: Every day | ORAL | 1 refills | Status: DC
  Filled 2024-04-02: qty 45, 90d supply, fill #0
  Filled 2024-07-03: qty 45, 90d supply, fill #1

## 2024-04-05 ENCOUNTER — Other Ambulatory Visit (HOSPITAL_COMMUNITY): Payer: Self-pay

## 2024-04-06 ENCOUNTER — Other Ambulatory Visit: Payer: Self-pay

## 2024-04-06 ENCOUNTER — Other Ambulatory Visit (HOSPITAL_COMMUNITY): Payer: Self-pay

## 2024-04-06 ENCOUNTER — Other Ambulatory Visit (HOSPITAL_BASED_OUTPATIENT_CLINIC_OR_DEPARTMENT_OTHER): Payer: Self-pay

## 2024-04-07 ENCOUNTER — Other Ambulatory Visit (INDEPENDENT_AMBULATORY_CARE_PROVIDER_SITE_OTHER): Payer: Self-pay | Admitting: Internal Medicine

## 2024-04-07 DIAGNOSIS — E119 Type 2 diabetes mellitus without complications: Secondary | ICD-10-CM

## 2024-04-09 ENCOUNTER — Other Ambulatory Visit (HOSPITAL_COMMUNITY): Payer: Self-pay

## 2024-04-09 ENCOUNTER — Other Ambulatory Visit: Payer: Self-pay

## 2024-04-09 MED ORDER — CARVEDILOL 3.125 MG PO TABS
3.1250 mg | ORAL_TABLET | Freq: Two times a day (BID) | ORAL | 1 refills | Status: DC
  Filled 2024-04-09: qty 180, 90d supply, fill #0
  Filled 2024-07-11: qty 180, 90d supply, fill #1

## 2024-04-11 ENCOUNTER — Encounter: Payer: Self-pay | Admitting: Plastic Surgery

## 2024-04-12 ENCOUNTER — Other Ambulatory Visit (HOSPITAL_COMMUNITY): Payer: Self-pay

## 2024-04-12 NOTE — Telephone Encounter (Signed)
 See msg, consult needed

## 2024-04-14 ENCOUNTER — Other Ambulatory Visit (HOSPITAL_COMMUNITY): Payer: Self-pay
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
## 2024-04-20 ENCOUNTER — Other Ambulatory Visit (HOSPITAL_COMMUNITY): Payer: Self-pay

## 2024-04-21 ENCOUNTER — Ambulatory Visit: Admitting: Student

## 2024-04-26 ENCOUNTER — Other Ambulatory Visit (HOSPITAL_COMMUNITY): Payer: Self-pay

## 2024-05-02 ENCOUNTER — Other Ambulatory Visit (INDEPENDENT_AMBULATORY_CARE_PROVIDER_SITE_OTHER): Payer: Self-pay | Admitting: Primary Care

## 2024-05-02 DIAGNOSIS — F32A Depression, unspecified: Secondary | ICD-10-CM

## 2024-05-03 ENCOUNTER — Other Ambulatory Visit (INDEPENDENT_AMBULATORY_CARE_PROVIDER_SITE_OTHER): Payer: Self-pay | Admitting: Primary Care

## 2024-05-03 ENCOUNTER — Other Ambulatory Visit: Payer: Self-pay

## 2024-05-03 ENCOUNTER — Other Ambulatory Visit (HOSPITAL_COMMUNITY): Payer: Self-pay

## 2024-05-03 ENCOUNTER — Other Ambulatory Visit: Payer: Self-pay | Admitting: Internal Medicine

## 2024-05-03 MED ORDER — ESCITALOPRAM OXALATE 20 MG PO TABS
20.0000 mg | ORAL_TABLET | Freq: Every day | ORAL | 0 refills | Status: DC
  Filled 2024-05-03: qty 90, 90d supply, fill #0

## 2024-05-04 ENCOUNTER — Other Ambulatory Visit (HOSPITAL_COMMUNITY): Payer: Self-pay

## 2024-05-04 ENCOUNTER — Other Ambulatory Visit: Payer: Self-pay

## 2024-05-04 MED ORDER — TETRAHYDROZOLINE HCL 0.05 % OP SOLN
1.0000 [drp] | Freq: Every day | OPHTHALMIC | 1 refills | Status: AC | PRN
  Filled 2024-05-04: qty 15, 75d supply, fill #0
  Filled 2024-09-10: qty 15, 150d supply, fill #0

## 2024-05-04 MED ORDER — OZEMPIC (1 MG/DOSE) 4 MG/3ML ~~LOC~~ SOPN
1.0000 mg | PEN_INJECTOR | SUBCUTANEOUS | 2 refills | Status: DC
  Filled 2024-05-04 – 2024-05-14 (×2): qty 3, 28d supply, fill #0
  Filled 2024-06-09: qty 3, 28d supply, fill #1
  Filled 2024-07-07: qty 3, 28d supply, fill #2

## 2024-05-04 NOTE — Telephone Encounter (Signed)
 Requested Prescriptions  Pending Prescriptions Disp Refills   tetrahydrozoline 0.05 % ophthalmic solution 15 mL 1    Sig: Place 1-2 drops into both eyes daily as needed (irritated eyes.). visine     Off-Protocol Failed - 05/04/2024 12:26 PM      Failed - Medication not assigned to a protocol, review manually.      Passed - Valid encounter within last 12 months    Recent Outpatient Visits           3 months ago Type 2 diabetes mellitus without complication, without long-term current use of insulin  (HCC)   [place] [facility] [name], NP   1 year ago Insomnia, unspecified type   [place] [facility] [name], NP   1 year ago Encounter for screening for cervical cancer   [place] [facility] [name], NP   1 year ago Encounter to establish care   [place] [facility] [name], NP       Future Appointments             In 1 week [name], NP [facility]             Semaglutide , 1 MG/DOSE, (OZEMPIC , 1 MG/DOSE,) 4 MG/3ML SOPN 3 mL 1    Sig: Inject 1 mg as directed once a week.     Endocrinology:  Diabetes - GLP-1 Receptor Agonists - semaglutide  Failed - 05/04/2024 12:26 PM      Failed - HBA1C in normal range and within 180 days    HbA1c, POC (controlled diabetic range)  Date Value Ref Range Status  01/14/2024 7.9 (A) 0.0 - 7.0 % Final         Passed - Cr in normal range and within 360 days    Creatinine, Ser  Date Value Ref Range Status  10/24/2023 0.76 0.57 - 1.00 mg/dL Final         Passed - Valid encounter within last 6 months    Recent Outpatient Visits           3 months ago Type 2 diabetes mellitus without complication, without long-term current use of insulin  (HCC)   [place] [facility] [name], NP   1 year ago Insomnia, unspecified type   [place] [facility] [name], NP   1 year ago Encounter for screening for cervical cancer   [place] [facility] [name], NP   1 year ago Encounter to establish care   [place] [facility] [name], NP       Future Appointments             In 1 week [name], NP [facility]

## 2024-05-04 NOTE — Telephone Encounter (Signed)
 Requested medication (s) are due for refill today: yes  Requested medication (s) are on the active medication list: yes  Last refill:  04/27/23  Future visit scheduled: no  Notes to clinic:  Medication not assigned to a protocol, review manually.      Requested Prescriptions  Pending Prescriptions Disp Refills   tetrahydrozoline 0.05 % ophthalmic solution 15 mL 1    Sig: Place 1-2 drops into both eyes daily as needed (irritated eyes.). visine     Off-Protocol Failed - 05/04/2024 12:27 PM      Failed - Medication not assigned to a protocol, review manually.      Passed - Valid encounter within last 12 months    Recent Outpatient Visits           3 months ago Type 2 diabetes mellitus without complication, without long-term current use of insulin  (HCC)   [place] [facility] [name], NP   1 year ago Insomnia, unspecified type   [place] [facility] [name], NP   1 year ago Encounter for screening for cervical cancer   [place] [facility] [name], NP   1 year ago Encounter to establish care   [place] [facility] [name], NP       Future Appointments             In 1 week [name], NP [facility]            Signed Prescriptions Disp Refills   Semaglutide , 1 MG/DOSE, (OZEMPIC , 1 MG/DOSE,) 4 MG/3ML SOPN 3 mL 2    Sig: Inject 1 mg as directed once a week.     Endocrinology:  Diabetes - GLP-1 Receptor Agonists - semaglutide  Failed - 05/04/2024 12:27 PM      Failed - HBA1C in normal range and within 180 days    HbA1c, POC (controlled diabetic range)  Date Value Ref Range Status  01/14/2024 7.9 (A) 0.0 - 7.0 % Final         Passed - Cr in normal range and within 360 days    Creatinine, Ser  Date Value Ref Range Status  10/24/2023 0.76 0.57 - 1.00 mg/dL Final         Passed - Valid encounter within last 6  months    Recent Outpatient Visits           3 months ago Type 2 diabetes mellitus without complication, without long-term current use of insulin  (HCC)   [place] [facility] [name], NP   1 year ago Insomnia, unspecified type   [place] [facility] [name], NP   1 year ago Encounter for screening for cervical cancer   [place] [facility] [name], NP   1 year ago Encounter to establish care    [facility] [name], NP       Future Appointments             In 1 week [name], NP [facility]

## 2024-05-05 ENCOUNTER — Other Ambulatory Visit: Payer: Self-pay

## 2024-05-05 ENCOUNTER — Other Ambulatory Visit (HOSPITAL_COMMUNITY): Payer: Self-pay

## 2024-05-05 MED ORDER — SACUBITRIL-VALSARTAN 24-26 MG PO TABS
1.0000 | ORAL_TABLET | Freq: Two times a day (BID) | ORAL | 6 refills | Status: DC
  Filled 2024-05-05 – 2024-05-18 (×3): qty 60, 30d supply, fill #0
  Filled 2024-06-15 – 2024-06-17 (×2): qty 60, 30d supply, fill #1
  Filled 2024-07-14 – 2024-07-31 (×5): qty 60, 30d supply, fill #2

## 2024-05-06 ENCOUNTER — Other Ambulatory Visit: Payer: Self-pay

## 2024-05-07 NOTE — Progress Notes (Signed)
 Remote ICD transmission.

## 2024-05-14 ENCOUNTER — Other Ambulatory Visit (HOSPITAL_COMMUNITY): Payer: Self-pay

## 2024-05-14 NOTE — Progress Notes (Deleted)
 Cardiology Clinic Note   Date: 05/14/2024 ID: [name], DOB 03-Apr-1966, MRN [id number]  Primary Cardiologist:  Stacey Hanson, MD  Chief Complaint   Stacey Richardson is a 58 y.o. female who presents to the clinic today for ***  Patient Profile   Stacey Richardson is followed by *** for the history outlined below.       Past medical history significant for: CAD. LHC 08/02/2020: Severe ostial LMCA stenosis 80% with pressure dampening of 76F diagnostic catheter.  No significant improvement with IC NTG.  Mild nonobstructive CAD involving LAD and dominant LCx.  CTS consult. 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 Nuclear stress test 01/17/2022: No significant ischemia.  No EKG changes concerning for ischemia at peak stress or in recovery.  EF estimated at 48%.  Low risk scan. Chronic HFrEF/ischemic cardiomyopathy. RHC 08/02/2020: Normal left and right heart filling pressures, normal Fick cardiac output/index. ICD implantation 12/05/2020. Remote device check 03/05/2024: A sensed V sensed.  Battery and lead parameters stable with stable capture and sensing.  Device programming is appropriate.  No arrhythmias noted. Echo 10/01/2021: EF 45 to 50%.  No RWMA.  Indeterminate diastolic parameters.  Normal RV size/function.  Mild MR.  (EF 30 to 35% on prior echo March 2022).*** Echo 12/19/2023: EF 50 to 55%.  No RWMA.  Normal diastolic parameters.  Normal RV size/function.  Normal PA pressure, RVSP 22.1 mmHg.  Mild LAE. SVT. SVT ablation 12/05/2020. Hypertension. Hyperlipidemia. Lipid panel 10/24/2023: LDL 64, HDL 49, TG 72, total 128. Stroke. T2DM.  In summary, patient presented to the ED in October 2021 with sudden onset of slurred speech.  She reported she was making a sandwich when she got news that her boyfriend had been in an MVA.  She had a brief episode of dizziness and presyncope.  She then contacted her boyfriend's father to drive her to the hospital and it was noted that her speech  was slurred and she was having difficulty finding words.  Upon arrival to the ED she was unable to communicate verbally with triage staff but was able to write.  EKG showed sinus tachycardia with PVCs and no acute ST-T wave changes.  Chest x-ray showed mild cardiomegaly with slight central congestion.  Head CT without acute intracranial pathology.  MRI/MRA brain/head showed 6 mm acute ischemic nonhemorrhagic cortical infarct involving the left frontal operculum with otherwise no acute findings.  tPA was not given due to improvement in symptoms.  Echo showed EF 35 to 40%, mild LVH, normal RV size/function, mild to moderate MR.  Patient was discharged from the hospital to undergo further cardiology workup as an outpatient.  She underwent 14-day ZIO monitoring which did not show any evidence of A-fib/flutter.  She did have brief episodes of Wenckebach at beta-blocker was discontinued.  She had a TEE with negative bubble study and no LA/LAA thrombus detected.  Right heart cath showed normal left and right heart filling pressures with normal cardiac output/index.  LHC showed severe left main disease and patient was transferred to [name] for CTS consult.  She underwent CABG x 2 in November 2021.  In early 2022 patient had an episode of narrow complex SVT with rates 170 to 180 bpm and highly symptomatic.  She was treated with adenosine.  She was evaluated by Dr. 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  Patient agreed to proceed with EP study and possible ICD implantation.  Repeat echo in March 2022 showed  EF 30 to 35%.  She underwent SVT ablation and ICD implantation March 2022.  Patient was seen in the office  on 04/02/2023 for follow-up after medication changes.  Patient had been seen in April 2024 with complaints of fatigue and carvedilol  was decreased.  At the time of her follow-up she reported doing well.  Zetia  was added for better lipid control.   Patient was last seen in the  office by me on 10/24/2023 for routine follow-up.  She was doing well at that time.  Echo was updated and showed improved LV function.     History of Present Illness    Today, patient ***  CAD S/p CABG x 11 July 2020.  Nuclear stress test May 2023 low risk with no significant ischemia.  Patient denies chest pain, pressure or tightness.  Patient*** -Continue aspirin , carvedilol , atorvastatin , Zetia , as needed SL NTG.   Chronic HFrEF/ischemic cardiomyopathy S/p ICD implantation March 2022.  Remote device check June 2025 showed normal device function, A-sensed V-sensed, stable battery and lead parameters, stable capture and sensing, appropriate device programming.  Echo April 2025 showed EF 50 to 55%, no RWMA, normal diastolic parameters, normal RV size/function, normal PA pressure.  Patient*** Euvolemic and well compensated on exam.  -Continue spironolactone , Entresto , Jardiance , carvedilol .   Hypertension BP today***.  -Continue Entresto , spironolactone , carvedilol .   Hyperlipidemia LDL 64 February 2025, at goal. -Continue atorvastatin  and Zetia .  ROS: All other systems reviewed and are otherwise negative except as noted in History of Present Illness.  EKGs/Labs Reviewed        10/24/2023: ALT 53; AST 29; BUN 11; Creatinine, Ser 0.76; Potassium 4.5; Sodium 142   10/24/2023: Hemoglobin 14.6; WBC 7.0   No results found for requested labs within last 365 days.   No results found for requested labs within last 365 days.  ***  Risk Assessment/Calculations    {Does this patient have ATRIAL FIBRILLATION?[phone]} No BP recorded.  {Refresh Note OR Click here to enter BP  :1}***        Physical Exam    VS:  LMP 11/21/2020  , BMI There is no height or weight on file to calculate BMI.  GEN: Well nourished, well developed, in no acute distress. Neck: No JVD or carotid bruits. Cardiac: *** RRR. *** No murmur. No rubs or gallops.   Respiratory:  Respirations regular and  unlabored. Clear to auscultation without rales, wheezing or rhonchi. GI: Soft, nontender, nondistended. Extremities: Radials/DP/PT 2+ and equal bilaterally. No clubbing or cyanosis. No edema ***  Skin: Warm and dry, no rash. Neuro: Strength intact.  Assessment & Plan   ***  Disposition: ***     {Are you ordering a CV Procedure (e.g. stress test, cath, DCCV, TEE, etc)?   Press F2        :[id number]}   Signed, Barnie HERO. Christmas Faraci, DNP, NP-C

## 2024-05-17 ENCOUNTER — Other Ambulatory Visit: Payer: Self-pay

## 2024-05-17 ENCOUNTER — Ambulatory Visit: Admitting: Student

## 2024-05-17 ENCOUNTER — Other Ambulatory Visit (HOSPITAL_COMMUNITY): Payer: Self-pay

## 2024-05-17 NOTE — Progress Notes (Unsigned)
 Cardiology Clinic Note   Date: 05/18/2024 ID: MURLINE WEIGEL, DOB 1965/12/04, MRN [id number]  [facility] Providers Cardiologist:  Lonni Hanson, MD Electrophysiologist:  OLE ONEIDA HOLTS, MD   Chief Complaint   Stacey Richardson is a 58 y.o. female who presents to the clinic today for routine follow up.   Patient Profile   Stacey Richardson is followed by Dr. Hanson and Dr. HOLTS for the history outlined below.       Past medical history significant for: CAD. LHC 08/02/2020: Severe ostial LMCA stenosis 80% with pressure dampening of 59F diagnostic catheter.  No significant improvement with IC NTG.  Mild nonobstructive CAD involving LAD and dominant LCx.  CTS consult. 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 Nuclear stress test 01/17/2022: No significant ischemia.  No EKG changes concerning for ischemia at peak stress or in recovery.  EF estimated at 48%.  Low risk scan. Chronic HFrEF/ischemic cardiomyopathy. RHC 08/02/2020: Normal left and right heart filling pressures, normal Fick cardiac output/index. ICD implantation 12/05/2020. Remote device check 03/05/2024: A sensed V sensed.  Battery and lead parameters stable with stable capture and sensing.  Device programming is appropriate.  No arrhythmias noted. Echo 12/19/2023: EF 50 to 55%.  No RWMA.  Normal diastolic parameters.  Normal RV size/function.  Normal PA pressure, RVSP 22.1 mmHg.  Mild LAE. SVT. SVT ablation 12/05/2020. Hypertension. Hyperlipidemia. Lipid panel 10/24/2023: LDL 64, HDL 49, TG 72, total 128. Stroke. T2DM.  In summary, patient presented to the ED in October 2021 with sudden onset of slurred speech.  She reported she was making a sandwich when she got news that her boyfriend had been in an MVA.  She had a brief episode of dizziness and presyncope.  She then contacted her boyfriend's father to drive her to the hospital and it was noted that her speech was slurred and she was having difficulty finding  words.  Upon arrival to the ED she was unable to communicate verbally with triage staff but was able to write.  EKG showed sinus tachycardia with PVCs and no acute ST-T wave changes.  Chest x-ray showed mild cardiomegaly with slight central congestion.  Head CT without acute intracranial pathology.  MRI/MRA brain/head showed 6 mm acute ischemic nonhemorrhagic cortical infarct involving the left frontal operculum with otherwise no acute findings.  tPA was not given due to improvement in symptoms.  Echo showed EF 35 to 40%, mild LVH, normal RV size/function, mild to moderate MR.  Patient was discharged from the hospital to undergo further cardiology workup as an outpatient.  She underwent 14-day ZIO monitoring which did not show any evidence of A-fib/flutter.  She did have brief episodes of Wenckebach at beta-blocker was discontinued.  She had a TEE with negative bubble study and no LA/LAA thrombus detected.  Right heart cath showed normal left and right heart filling pressures with normal cardiac output/index.  LHC showed severe left main disease and patient was transferred to [name] for CTS consult.  She underwent CABG x 2 in November 2021.  In early 2022 patient had an episode of narrow complex SVT with rates 170 to 180 bpm and highly symptomatic.  She was treated with adenosine.  She was evaluated by Dr. 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  Patient agreed to proceed with EP study and possible ICD implantation.  Repeat echo in March 2022 showed EF 30 to 35%.  She underwent SVT ablation and ICD implantation March  2022.  Patient was seen in the office  on 04/02/2023 for follow-up after medication changes.  Patient had been seen in April 2024 with complaints of fatigue and carvedilol  was decreased.  At the time of her follow-up she reported doing well.  Zetia  was added for better lipid control.   Patient was last seen in the office by me on 10/24/2023 for routine follow-up.  She  was doing well at that time.  Echo was updated and showed improved LV function.     History of Present Illness    Today, patient is doing well. Patient denies shortness of breath, dyspnea on exertion, lower extremity edema, orthopnea or PND. No chest pain, pressure, or tightness. No palpitations.  She does not follow a regular exercise routine. She does have a treadmill at home. She is very interested in losing weight. She mentions that she snores. She has never had a sleep study. Discussed last echo in detail. All questions answered.     ROS: All other systems reviewed and are otherwise negative except as noted in History of Present Illness.  EKGs/Labs Reviewed       EKG is not ordered today.   10/24/2023: ALT 53; AST 29; BUN 11; Creatinine, Ser 0.76; Potassium 4.5; Sodium 142   10/24/2023: Hemoglobin 14.6; WBC 7.0    Risk Assessment/Calculations         STOP-Bang Score:  4       Physical Exam    VS:  BP 132/70 (BP Location: Right Arm)   Pulse 93   Ht 5' 6 (1.676 m)   Wt 201 lb 9.6 oz (91.4 kg)   LMP 11/21/2020   SpO2 97%   BMI 32.54 kg/m  , BMI Body mass index is 32.54 kg/m.  GEN: Well nourished, well developed, in no acute distress. Neck: No JVD or carotid bruits. Cardiac:  RRR.  No murmur. No rubs or gallops.   Respiratory:  Respirations regular and unlabored. Clear to auscultation without rales, wheezing or rhonchi. GI: Soft, nontender, nondistended. Extremities: Radials/DP/PT 2+ and equal bilaterally. No clubbing or cyanosis. No edema.  Skin: Warm and dry, no rash. Neuro: Strength intact.  Assessment & Plan   CAD S/p CABG x 11 July 2020.  Nuclear stress test May 2023 low risk with no significant ischemia.  Patient denies chest pain, pressure or tightness.  She does not follow a routine exercise routine. She is interested in losing weight.  - Encouraged increasing physical activity as tolerated.   - Continue aspirin , carvedilol , atorvastatin , Zetia , as  needed SL NTG.   Chronic HFrEF/ischemic cardiomyopathy S/p ICD implantation March 2022.  Remote device check June 2025 showed normal device function, A-sensed V-sensed, stable battery and lead parameters, stable capture and sensing, appropriate device programming.  Echo April 2025 showed EF 50 to 55%, no RWMA, normal diastolic parameters, normal RV size/function, normal PA pressure.  Patient denies lower extremity edema, shortness of breath, orthopnea or PND.  Euvolemic and well compensated on exam.  - Continue spironolactone , Entresto , Jardiance , carvedilol . - Schedule EP follow up.    Hypertension BP today 132/70. She reports occasional positional lightheadedness. Discussed slow position changes.  -Continue Entresto , spironolactone , carvedilol .   Hyperlipidemia LDL 64 February 2025, at goal. -Continue atorvastatin  and Zetia .  Sleep disordered breathing Patient reports snoring at night. Stop Bang 4.  - Refer to pulmonology.   Disposition: Refer to pulmonology. Return in 6 months or sooner as needed.          Signed, Barnie  EMERSON Hila, DNP, NP-C

## 2024-05-18 ENCOUNTER — Other Ambulatory Visit (HOSPITAL_COMMUNITY): Payer: Self-pay

## 2024-05-18 ENCOUNTER — Ambulatory Visit: Attending: Student | Admitting: Student

## 2024-05-18 ENCOUNTER — Encounter: Payer: Self-pay | Admitting: Student

## 2024-05-18 ENCOUNTER — Other Ambulatory Visit: Payer: Self-pay

## 2024-05-18 VITALS — BP 132/70 | HR 93 | Ht 66.0 in | Wt 201.6 lb

## 2024-05-18 DIAGNOSIS — I255 Ischemic cardiomyopathy: Secondary | ICD-10-CM

## 2024-05-18 DIAGNOSIS — G4730 Sleep apnea, unspecified: Secondary | ICD-10-CM

## 2024-05-18 DIAGNOSIS — I10 Essential (primary) hypertension: Secondary | ICD-10-CM

## 2024-05-18 DIAGNOSIS — I5032 Chronic diastolic (congestive) heart failure: Secondary | ICD-10-CM | POA: Diagnosis not present

## 2024-05-18 DIAGNOSIS — E785 Hyperlipidemia, unspecified: Secondary | ICD-10-CM

## 2024-05-18 DIAGNOSIS — I25810 Atherosclerosis of coronary artery bypass graft(s) without angina pectoris: Secondary | ICD-10-CM | POA: Diagnosis not present

## 2024-05-18 NOTE — Patient Instructions (Addendum)
 Medication Instructions:  Your physician recommends that you continue on your current medications as directed. Please refer to the Current Medication list given to you today.   *If you need a refill on your cardiac medications before your next appointment, please call your pharmacy*  Lab Work: None ordered at this time  If you have labs (blood work) drawn today and your tests are completely normal, you will receive your results only by: MyChart Message (if you have MyChart) OR A paper copy in the mail If you have any lab test that is abnormal or we need to change your treatment, we will call you to review the results.  Testing/Procedures: None ordered at this time   Your cardiologist has referred you to Pulmonary  We have attached their office location and phone number below.  Please allow them 3-5 business days to reach out to you to make an appointment.  If you have not heard from their office within that time, please call them to schedule your appointment.    Follow-Up: At [facility], you and your health needs are our priority.  As part of our continuing mission to provide you with exceptional heart care, our providers are all part of one team.  This team includes your primary Cardiologist (physician) and Advanced Practice Providers or APPs (Physician Assistants and Nurse Practitioners) who all work together to provide you with the care you need, when you need it.  Your next appointment:   6 month(s)  Provider:   Lonni Hanson, MD or Barnie Hila, NP    We recommend signing up for the patient portal called MyChart.  Sign up information is provided on this After Visit Summary.  MyChart is used to connect with patients for Virtual Visits (Telemedicine).  Patients are able to view lab/test results, encounter notes, upcoming appointments, etc.  Non-urgent messages can be sent to your provider as well.   To learn more about what you can do with MyChart, go to  [email or web address].   Other Instructions  We will get you scheduled to see Dr. Cindie or Suzann Riddle, NP for EP follow up today.

## 2024-06-02 ENCOUNTER — Other Ambulatory Visit: Payer: Self-pay | Admitting: Internal Medicine

## 2024-06-03 ENCOUNTER — Other Ambulatory Visit (HOSPITAL_COMMUNITY): Payer: Self-pay

## 2024-06-03 MED ORDER — ATORVASTATIN CALCIUM 80 MG PO TABS
80.0000 mg | ORAL_TABLET | Freq: Every day | ORAL | 3 refills | Status: AC
  Filled 2024-06-03: qty 90, 90d supply, fill #0
  Filled 2024-09-09 – 2024-09-16 (×2): qty 90, 90d supply, fill #1

## 2024-06-04 ENCOUNTER — Ambulatory Visit: Payer: Medicaid Other

## 2024-06-04 DIAGNOSIS — I255 Ischemic cardiomyopathy: Secondary | ICD-10-CM

## 2024-06-05 LAB — CUP PACEART REMOTE DEVICE CHECK
Date Time Interrogation Session: 20250926101853
Implantable Lead Connection Status: 753985
Implantable Lead Implant Date: 20220329
Implantable Lead Location: 753860
Implantable Lead Model: 436909
Implantable Lead Serial Number: 81437635
Implantable Pulse Generator Implant Date: 20220329
Pulse Gen Model: 429525
Pulse Gen Serial Number: 84824217

## 2024-06-09 ENCOUNTER — Other Ambulatory Visit: Payer: Self-pay

## 2024-06-09 NOTE — Progress Notes (Signed)
 Remote ICD Transmission

## 2024-06-10 ENCOUNTER — Other Ambulatory Visit (HOSPITAL_COMMUNITY): Payer: Self-pay

## 2024-06-10 ENCOUNTER — Ambulatory Visit: Payer: Self-pay | Admitting: Cardiology

## 2024-06-10 ENCOUNTER — Ambulatory Visit: Admitting: Student in an Organized Health Care Education/Training Program

## 2024-06-11 ENCOUNTER — Other Ambulatory Visit: Payer: Self-pay

## 2024-06-15 ENCOUNTER — Other Ambulatory Visit (HOSPITAL_COMMUNITY): Payer: Self-pay

## 2024-06-17 ENCOUNTER — Other Ambulatory Visit: Payer: Self-pay

## 2024-06-21 ENCOUNTER — Ambulatory Visit: Admitting: Sleep Medicine

## 2024-07-04 ENCOUNTER — Other Ambulatory Visit (HOSPITAL_COMMUNITY): Payer: Self-pay

## 2024-07-08 ENCOUNTER — Other Ambulatory Visit: Payer: Self-pay

## 2024-07-12 ENCOUNTER — Other Ambulatory Visit (HOSPITAL_COMMUNITY): Payer: Self-pay

## 2024-07-13 ENCOUNTER — Other Ambulatory Visit: Payer: Self-pay

## 2024-07-15 ENCOUNTER — Other Ambulatory Visit: Payer: Self-pay

## 2024-07-16 ENCOUNTER — Other Ambulatory Visit: Payer: Self-pay

## 2024-07-18 ENCOUNTER — Other Ambulatory Visit (HOSPITAL_COMMUNITY): Payer: Self-pay

## 2024-07-19 ENCOUNTER — Other Ambulatory Visit (HOSPITAL_COMMUNITY): Payer: Self-pay

## 2024-07-19 ENCOUNTER — Other Ambulatory Visit: Payer: Self-pay

## 2024-07-19 NOTE — Progress Notes (Deleted)
  Electrophysiology Office Follow up Visit Note:    Date:  07/19/2024   ID:  Stacey Richardson, DOB Dec 10, 1965, MRN [id number]  PCP:  [name], NP  [facility] Cardiologist:  Lonni Hanson, MD  [facility] Electrophysiologist:  OLE ONEIDA HOLTS, MD    Interval History:     [name] is a 58 y.o. female who presents for a follow up visit.   I last saw the patient December 19, 2021.  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  The patient also has an ICD given history of chronic systolic heart failure.        Past medical, surgical, social and family history were reviewed.  ROS:   Please see the history of present illness.    All other systems reviewed and are negative.  EKGs/Labs/Other Studies Reviewed:    The following studies were reviewed today:  July 21, 2024 in-clinic device interrogation personally reviewed ***        Physical Exam:    VS:  LMP 11/21/2020     Wt Readings from Last 3 Encounters:  05/18/24 201 lb 9.6 oz (91.4 kg)  01/14/24 204 lb 12.8 oz (92.9 kg)  10/24/23 204 lb 3.2 oz (92.6 kg)     GEN: no distress CARD: RRR, No MRG.  Defibrillator pocket well-healed RESP: No IWOB. CTAB.      ASSESSMENT:    No diagnosis found. PLAN:    In order of problems listed above:  #Chronic systolic heart failure NYHA class II.  Warm and dry on exam.  Continue GDMT.  #ICD in situ Device functioning appropriately, continue remote monitoring  I discussed my upcoming departure from [name] during today's clinic appointment.  The patient will continue to follow-up with one of my partners, Dr. Kennyth.  Follow-up with the EP APP in 1 year.   Signed, Ole Holts, MD, [facility], [facility] 07/19/2024 11:48 AM    Electrophysiology [facility]

## 2024-07-20 ENCOUNTER — Encounter (INDEPENDENT_AMBULATORY_CARE_PROVIDER_SITE_OTHER): Payer: Self-pay

## 2024-07-20 ENCOUNTER — Ambulatory Visit (INDEPENDENT_AMBULATORY_CARE_PROVIDER_SITE_OTHER)

## 2024-07-21 ENCOUNTER — Ambulatory Visit: Admitting: Cardiology

## 2024-07-21 DIAGNOSIS — I5022 Chronic systolic (congestive) heart failure: Secondary | ICD-10-CM

## 2024-07-21 DIAGNOSIS — Z95810 Presence of automatic (implantable) cardiac defibrillator: Secondary | ICD-10-CM

## 2024-07-29 ENCOUNTER — Ambulatory Visit: Admitting: Internal Medicine

## 2024-07-29 DIAGNOSIS — R0683 Snoring: Secondary | ICD-10-CM

## 2024-07-31 ENCOUNTER — Other Ambulatory Visit (INDEPENDENT_AMBULATORY_CARE_PROVIDER_SITE_OTHER): Payer: Self-pay | Admitting: Primary Care

## 2024-07-31 DIAGNOSIS — F32A Depression, unspecified: Secondary | ICD-10-CM

## 2024-08-02 ENCOUNTER — Other Ambulatory Visit (HOSPITAL_COMMUNITY): Payer: Self-pay

## 2024-08-02 MED ORDER — ESCITALOPRAM OXALATE 20 MG PO TABS
20.0000 mg | ORAL_TABLET | Freq: Every day | ORAL | 0 refills | Status: DC
  Filled 2024-08-02 – 2024-08-17 (×2): qty 30, 30d supply, fill #0

## 2024-08-02 NOTE — Telephone Encounter (Signed)
 Courtesy refill. Patient will need an office visit for additional refills.  Requested Prescriptions  Pending Prescriptions Disp Refills   escitalopram  (LEXAPRO ) 20 MG tablet 30 tablet 0    Sig: Take 1 tablet (20 mg total) by mouth daily.     Psychiatry:  Antidepressants - SSRI Failed - 08/02/2024  4:02 PM      Failed - Valid encounter within last 6 months    Recent Outpatient Visits           6 months ago Type 2 diabetes mellitus without complication, without long-term current use of insulin  [facility])   [place] [facility] [name], NP   1 year ago Insomnia, unspecified type   [place] [facility] [name], NP   1 year ago Encounter for screening for cervical cancer   [place] [facility] [name], NP   1 year ago Encounter to establish care   [place] [facility] [name], NP

## 2024-08-03 ENCOUNTER — Other Ambulatory Visit (HOSPITAL_COMMUNITY): Payer: Self-pay

## 2024-08-03 NOTE — Progress Notes (Unsigned)
  Electrophysiology Office Follow up Visit Note:    Date:  08/04/2024   ID:  Stacey Richardson, DOB [birth year], MRN [id number]  PCP:  [name], NP  [facility] Cardiologist:  Lonni Hanson, MD  [facility] Electrophysiologist:  OLE ONEIDA HOLTS, MD    Interval History:     Stacey Richardson is a 57 y.o. female who presents for a follow up visit.   I last saw the patient December 19, 2021.  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  The patient also has an ICD given history of chronic systolic heart failure. She is with her daughter today in clinic.  She has been doing well.  No problems with her defibrillator.       Past medical, surgical, social and family history were reviewed.  ROS:   Please see the history of present illness.    All other systems reviewed and are negative.  EKGs/Labs/Other Studies Reviewed:    The following studies were reviewed today:  July 21, 2024 in-clinic device interrogation personally reviewed Battery and lead parameter stable.        Physical Exam:    VS:  BP 128/64 (BP Location: Left Arm, Patient Position: Sitting, Cuff Size: Normal)   Pulse 78   Ht 5' 6 (1.676 m)   Wt 197 lb 9.6 oz (89.6 kg)   LMP 11/21/2020   SpO2 98%   BMI 31.89 kg/m     Wt Readings from Last 3 Encounters:  08/04/24 197 lb 9.6 oz (89.6 kg)  05/18/24 201 lb 9.6 oz (91.4 kg)  01/14/24 204 lb 12.8 oz (92.9 kg)     GEN: no distress CARD: RRR, No MRG.  Defibrillator pocket well-healed RESP: No IWOB. CTAB.      ASSESSMENT:    1. Chronic HFrEF (heart failure with reduced ejection fraction) (HCC)   2. Implantable cardioverter-defibrillator (ICD) in situ    PLAN:    In order of problems listed above:  #Chronic systolic heart failure NYHA class II.  Warm and dry on exam.  Continue GDMT.  #ICD in situ Device functioning appropriately, continue remote monitoring  I discussed my upcoming  departure from [name] during today's clinic appointment.  The patient will continue to follow-up with one of my partners, Dr. Kennyth.  Follow-up with the EP APP in 1 year.   Signed, Ole Holts, MD, [facility], [facility] 08/04/2024 4:06 PM    Electrophysiology [facility]

## 2024-08-04 ENCOUNTER — Encounter: Payer: Self-pay | Admitting: Cardiology

## 2024-08-04 ENCOUNTER — Ambulatory Visit: Attending: Cardiology | Admitting: Cardiology

## 2024-08-04 ENCOUNTER — Other Ambulatory Visit: Payer: Self-pay

## 2024-08-04 VITALS — BP 128/64 | HR 78 | Ht 66.0 in | Wt 197.6 lb

## 2024-08-04 DIAGNOSIS — Z95810 Presence of automatic (implantable) cardiac defibrillator: Secondary | ICD-10-CM | POA: Insufficient documentation

## 2024-08-04 DIAGNOSIS — I5022 Chronic systolic (congestive) heart failure: Secondary | ICD-10-CM | POA: Diagnosis not present

## 2024-08-04 NOTE — Patient Instructions (Signed)

## 2024-08-09 ENCOUNTER — Ambulatory Visit: Payer: Self-pay | Admitting: Cardiology

## 2024-08-09 LAB — CUP PACEART INCLINIC DEVICE CHECK
Date Time Interrogation Session: 20251126161732
Implantable Lead Connection Status: 753985
Implantable Lead Implant Date: 20220329
Implantable Lead Location: 753860
Implantable Lead Model: 436909
Implantable Lead Serial Number: 81437635
Implantable Pulse Generator Implant Date: 20220329
Pulse Gen Model: 429525
Pulse Gen Serial Number: 84824217

## 2024-08-11 ENCOUNTER — Other Ambulatory Visit (HOSPITAL_COMMUNITY): Payer: Self-pay

## 2024-08-11 ENCOUNTER — Telehealth: Payer: Self-pay | Admitting: Internal Medicine

## 2024-08-11 NOTE — Telephone Encounter (Signed)
 Pt c/o medication issue:  1. Name of Medication:   sacubitril -valsartan  (ENTRESTO ) 24-26 MG  spironolactone  (ALDACTONE ) 25 MG tablet   2. How are you currently taking this medication (dosage and times per day)?   As prescribed  3. Are you having a reaction (difficulty breathing--STAT)?   4. What is your medication issue?   Patient stated she has been completely out of these medications for about a week and her insurance will not start again until January 2026.  Patient wants a call back to advise on next steps.

## 2024-08-12 ENCOUNTER — Other Ambulatory Visit (HOSPITAL_COMMUNITY): Payer: Self-pay

## 2024-08-12 ENCOUNTER — Other Ambulatory Visit: Payer: Self-pay

## 2024-08-12 ENCOUNTER — Telehealth: Payer: Self-pay | Admitting: Pharmacy Technician

## 2024-08-12 MED ORDER — LOSARTAN POTASSIUM 25 MG PO TABS
25.0000 mg | ORAL_TABLET | Freq: Every day | ORAL | 3 refills | Status: DC
  Filled 2024-08-12 – 2024-08-13 (×2): qty 90, 90d supply, fill #0

## 2024-08-12 NOTE — Telephone Encounter (Signed)
 Sure.  Follow-up BMP in ~2 weeks would be fine.  Thanks.  Lonni Hanson, MD [facility]

## 2024-08-12 NOTE — Telephone Encounter (Signed)
 We can switch her from Entresto  to losartan  25 mg daily.  She should be able to continue spironolactone , as it is generic and on the $5 list.  Thanks.  Lonni Hanson, MD [facility]

## 2024-08-12 NOTE — Telephone Encounter (Signed)
 SABRA

## 2024-08-13 ENCOUNTER — Encounter (HOSPITAL_COMMUNITY): Payer: Self-pay | Admitting: Pharmacist

## 2024-08-13 ENCOUNTER — Other Ambulatory Visit: Payer: Self-pay

## 2024-08-13 ENCOUNTER — Other Ambulatory Visit (HOSPITAL_COMMUNITY): Payer: Self-pay

## 2024-08-14 ENCOUNTER — Other Ambulatory Visit (HOSPITAL_COMMUNITY): Payer: Self-pay

## 2024-08-16 ENCOUNTER — Other Ambulatory Visit: Payer: Self-pay

## 2024-08-17 ENCOUNTER — Other Ambulatory Visit: Payer: Self-pay

## 2024-08-18 ENCOUNTER — Other Ambulatory Visit (HOSPITAL_COMMUNITY): Payer: Self-pay

## 2024-08-21 ENCOUNTER — Other Ambulatory Visit (HOSPITAL_COMMUNITY): Payer: Self-pay

## 2024-08-22 ENCOUNTER — Other Ambulatory Visit (HOSPITAL_COMMUNITY): Payer: Self-pay

## 2024-08-23 ENCOUNTER — Other Ambulatory Visit: Payer: Self-pay

## 2024-08-23 ENCOUNTER — Other Ambulatory Visit (HOSPITAL_COMMUNITY): Payer: Self-pay

## 2024-08-24 NOTE — Telephone Encounter (Signed)
 Copied from CRM #[id number]. Topic: Clinical - Medication Question >> Aug 24, 2024  3:29 PM Winona R wrote: Pt would like to know if the office can provide her with a sample of jardiance  medication as there insurance will not cover

## 2024-08-25 ENCOUNTER — Other Ambulatory Visit (HOSPITAL_COMMUNITY): Payer: Self-pay

## 2024-08-26 ENCOUNTER — Other Ambulatory Visit (HOSPITAL_COMMUNITY): Payer: Self-pay

## 2024-08-30 ENCOUNTER — Other Ambulatory Visit (HOSPITAL_COMMUNITY): Payer: Self-pay

## 2024-09-03 ENCOUNTER — Ambulatory Visit (INDEPENDENT_AMBULATORY_CARE_PROVIDER_SITE_OTHER): Payer: Self-pay

## 2024-09-03 DIAGNOSIS — I255 Ischemic cardiomyopathy: Secondary | ICD-10-CM | POA: Diagnosis not present

## 2024-09-05 ENCOUNTER — Ambulatory Visit: Payer: Self-pay | Admitting: Cardiology

## 2024-09-05 LAB — CUP PACEART REMOTE DEVICE CHECK
Date Time Interrogation Session: 20251226173517
Implantable Lead Connection Status: 753985
Implantable Lead Implant Date: 20220329
Implantable Lead Location: 753860
Implantable Lead Model: 436909
Implantable Lead Serial Number: 81437635
Implantable Pulse Generator Implant Date: 20220329
Pulse Gen Model: 429525
Pulse Gen Serial Number: 84824217

## 2024-09-06 NOTE — Progress Notes (Signed)
 Remote ICD Transmission

## 2024-09-08 ENCOUNTER — Other Ambulatory Visit (HOSPITAL_COMMUNITY): Payer: Self-pay

## 2024-09-08 ENCOUNTER — Encounter: Payer: Self-pay | Admitting: Internal Medicine

## 2024-09-08 DIAGNOSIS — I10 Essential (primary) hypertension: Secondary | ICD-10-CM

## 2024-09-08 MED ORDER — SACUBITRIL-VALSARTAN 24-26 MG PO TABS
1.0000 | ORAL_TABLET | Freq: Two times a day (BID) | ORAL | 3 refills | Status: AC
  Filled 2024-09-08 – 2024-09-15 (×3): qty 180, 90d supply, fill #0

## 2024-09-08 NOTE — Telephone Encounter (Signed)
 If Entresto  is no longer cost prohibitive in the new year, I agree with switching back from losartan  to Entresto  24-26 mg twice daily.  I recommend checking a basic metabolic panel about 2 weeks after making the switch to ensure that Stacey Richardson kidney function and electrolytes are stable.  Lonni Hanson, MD [facility]

## 2024-09-09 ENCOUNTER — Other Ambulatory Visit (INDEPENDENT_AMBULATORY_CARE_PROVIDER_SITE_OTHER): Payer: Self-pay | Admitting: Primary Care

## 2024-09-09 ENCOUNTER — Other Ambulatory Visit: Payer: Self-pay | Admitting: Internal Medicine

## 2024-09-09 DIAGNOSIS — F32A Depression, unspecified: Secondary | ICD-10-CM

## 2024-09-10 ENCOUNTER — Other Ambulatory Visit (HOSPITAL_COMMUNITY): Payer: Self-pay

## 2024-09-10 ENCOUNTER — Other Ambulatory Visit (INDEPENDENT_AMBULATORY_CARE_PROVIDER_SITE_OTHER): Payer: Self-pay | Admitting: Primary Care

## 2024-09-10 ENCOUNTER — Other Ambulatory Visit: Payer: Self-pay

## 2024-09-10 DIAGNOSIS — F32A Depression, unspecified: Secondary | ICD-10-CM

## 2024-09-10 MED ORDER — NITROGLYCERIN 0.4 MG SL SUBL
0.4000 mg | SUBLINGUAL_TABLET | SUBLINGUAL | 3 refills | Status: AC | PRN
  Filled 2024-09-10: qty 25, 9d supply, fill #0

## 2024-09-10 NOTE — Telephone Encounter (Signed)
 Requested medication (s) are due for refill today: yes  Requested medication (s) are on the active medication list: yes  Last refill:  05/04/24,08/02/24  Future visit scheduled: no  Notes to clinic:  Unable to refill per protocol, courtesy refill already given, routing for provider approval.      Requested Prescriptions  Pending Prescriptions Disp Refills   Semaglutide , 1 MG/DOSE, (OZEMPIC , 1 MG/DOSE,) 4 MG/3ML SOPN 3 mL 2    Sig: Inject 1 mg into the skin once a week.     Endocrinology:  Diabetes - GLP-1 Receptor Agonists - semaglutide  Failed - 09/10/2024  1:21 PM      Failed - HBA1C in normal range and within 180 days    HbA1c, POC (controlled diabetic range)  Date Value Ref Range Status  01/14/2024 7.9 (A) 0.0 - 7.0 % Final         Failed - Valid encounter within last 6 months    Recent Outpatient Visits           8 months ago Type 2 diabetes mellitus without complication, without long-term current use of insulin  (HCC)   [place] [facility] [name], NP   1 year ago Insomnia, unspecified type   [place] [facility] [name], NP   1 year ago Encounter for screening for cervical cancer   [place] [facility] [name], NP   1 year ago Encounter to establish care   [place] [facility] [name], NP              Passed - Cr in normal range and within 360 days    Creatinine, Ser  Date Value Ref Range Status  10/24/2023 0.76 0.57 - 1.00 mg/dL Final          escitalopram  (LEXAPRO ) 20 MG tablet 30 tablet 0    Sig: Take 1 tablet (20 mg total) by mouth daily.     Psychiatry:  Antidepressants - SSRI Failed - 09/10/2024  1:21 PM      Failed - Valid encounter within last 6 months    Recent Outpatient Visits           8 months ago Type 2 diabetes mellitus without complication, without long-term current use of insulin  (HCC)   [place] [facility] [name], NP   1 year ago Insomnia, unspecified type   [place] [facility] [name], NP   1 year ago Encounter for screening for cervical cancer   [place] [facility] [name], NP   1 year ago Encounter to establish care    [facility] [name], NP

## 2024-09-11 ENCOUNTER — Other Ambulatory Visit (HOSPITAL_COMMUNITY): Payer: Self-pay

## 2024-09-13 ENCOUNTER — Other Ambulatory Visit: Payer: Self-pay

## 2024-09-13 NOTE — Telephone Encounter (Signed)
 Will forward to provider

## 2024-09-13 NOTE — Telephone Encounter (Signed)
 Requested medication (s) are due for refill today: Yes  Requested medication (s) are on the active medication list: Yes  Last refill:    Future visit scheduled: No  Notes to clinic:  Left message to call and make appointment.    Requested Prescriptions  Pending Prescriptions Disp Refills   Semaglutide , 1 MG/DOSE, (OZEMPIC , 1 MG/DOSE,) 4 MG/3ML SOPN 3 mL 2    Sig: Inject 1 mg into the skin once a week.     Endocrinology:  Diabetes - GLP-1 Receptor Agonists - semaglutide  Failed - 09/13/2024 12:29 PM      Failed - HBA1C in normal range and within 180 days    HbA1c, POC (controlled diabetic range)  Date Value Ref Range Status  01/14/2024 7.9 (A) 0.0 - 7.0 % Final         Failed - Valid encounter within last 6 months    Recent Outpatient Visits           8 months ago Type 2 diabetes mellitus without complication, without long-term current use of insulin  (HCC)   [place] [facility] [name], NP   1 year ago Insomnia, unspecified type   [place] [facility] [name], NP   1 year ago Encounter for screening for cervical cancer   [place] [facility] [name], NP   1 year ago Encounter to establish care   [place] [facility] [name], NP              Passed - Cr in normal range and within 360 days    Creatinine, Ser  Date Value Ref Range Status  10/24/2023 0.76 0.57 - 1.00 mg/dL Final          escitalopram  (LEXAPRO ) 20 MG tablet 30 tablet 0    Sig: Take 1 tablet (20 mg total) by mouth daily.     Psychiatry:  Antidepressants - SSRI Failed - 09/13/2024 12:29 PM      Failed - Valid encounter within last 6 months    Recent Outpatient Visits           8 months ago Type 2 diabetes mellitus without complication, without long-term current use of insulin  (HCC)   [place] [facility] [name], NP   1 year ago Insomnia,  unspecified type   [place] [facility] [name], NP   1 year ago Encounter for screening for cervical cancer   [place] [facility] [name], NP   1 year ago Encounter to establish care   [place] [facility] [name], NP

## 2024-09-14 ENCOUNTER — Other Ambulatory Visit: Payer: Self-pay

## 2024-09-14 ENCOUNTER — Other Ambulatory Visit (HOSPITAL_COMMUNITY): Payer: Self-pay

## 2024-09-15 ENCOUNTER — Other Ambulatory Visit (HOSPITAL_COMMUNITY): Payer: Self-pay

## 2024-09-15 ENCOUNTER — Other Ambulatory Visit: Payer: Self-pay

## 2024-09-16 ENCOUNTER — Other Ambulatory Visit (HOSPITAL_COMMUNITY): Payer: Self-pay

## 2024-09-16 ENCOUNTER — Other Ambulatory Visit: Payer: Self-pay

## 2024-09-20 ENCOUNTER — Other Ambulatory Visit: Payer: Self-pay

## 2024-09-20 MED ORDER — ESCITALOPRAM OXALATE 20 MG PO TABS
20.0000 mg | ORAL_TABLET | Freq: Every day | ORAL | 0 refills | Status: AC
  Filled 2024-09-20: qty 30, 30d supply, fill #0

## 2024-09-20 MED ORDER — OZEMPIC (1 MG/DOSE) 4 MG/3ML ~~LOC~~ SOPN
1.0000 mg | PEN_INJECTOR | SUBCUTANEOUS | 2 refills | Status: DC
  Filled 2024-09-20: qty 3, 28d supply, fill #0

## 2024-09-21 ENCOUNTER — Other Ambulatory Visit: Payer: Self-pay

## 2024-09-21 ENCOUNTER — Telehealth (INDEPENDENT_AMBULATORY_CARE_PROVIDER_SITE_OTHER): Payer: Self-pay | Admitting: Primary Care

## 2024-09-21 NOTE — Telephone Encounter (Signed)
 Spoke to pt about upcoming appt.. Will be present

## 2024-09-23 ENCOUNTER — Other Ambulatory Visit: Payer: Self-pay

## 2024-09-26 ENCOUNTER — Other Ambulatory Visit: Payer: Self-pay | Admitting: Medical

## 2024-09-26 DIAGNOSIS — I5022 Chronic systolic (congestive) heart failure: Secondary | ICD-10-CM

## 2024-09-26 DIAGNOSIS — I10 Essential (primary) hypertension: Secondary | ICD-10-CM

## 2024-09-27 ENCOUNTER — Other Ambulatory Visit (HOSPITAL_COMMUNITY): Payer: Self-pay

## 2024-09-27 ENCOUNTER — Other Ambulatory Visit: Payer: Self-pay

## 2024-09-27 ENCOUNTER — Telehealth: Payer: Self-pay | Admitting: Primary Care

## 2024-09-27 MED ORDER — SPIRONOLACTONE 25 MG PO TABS
12.5000 mg | ORAL_TABLET | Freq: Every day | ORAL | 1 refills | Status: AC
  Filled 2024-09-27: qty 45, 90d supply, fill #0

## 2024-09-27 NOTE — Telephone Encounter (Signed)
 09/27/24 Left VM confirming address and appointment

## 2024-09-28 ENCOUNTER — Other Ambulatory Visit: Payer: Self-pay

## 2024-09-29 ENCOUNTER — Ambulatory Visit: Admitting: Primary Care

## 2024-09-29 ENCOUNTER — Ambulatory Visit: Payer: Self-pay | Admitting: Primary Care

## 2024-09-29 ENCOUNTER — Ambulatory Visit: Payer: Self-pay

## 2024-09-29 NOTE — Telephone Encounter (Signed)
 FYI Only or Action Required?: Action required by provider: request for appointment, clinical question for provider, update on patient condition, and needs social work assistance for living situation.  Patient was last seen in primary care on 01/14/2024 by [name], NP.  Called Nurse Triage reporting Anxiety, Insomnia, housing concerns, and Shortness of Breath.  Symptoms began several days ago.  Interventions attempted: Nothing.  Symptoms are: gradually worsening.  Triage Disposition: See HCP Within 4 Hours (Or PCP Triage) - pt states she will get ride to UC when able, likely tomorrow  Patient/caregiver understands and will follow disposition?: Yes       Reason for Disposition  [1] MILD difficulty breathing (e.g., minimal/no SOB at rest, SOB with walking, pulse < 100) AND [2] NEW-onset or WORSE than normal  Answer Assessment - Initial Assessment Questions This RN recommended pt be examined in next 4 hours, pt refusing disposition due to transportation issues, requesting next available with PCP, no availability until 2/5 but this RN scheduled and placed on waitlist and advised pt go to UC as soon as able. Pt states she will go to [facility] tomorrow when she has a ride. Advised pt call back or seek immediate care if any new or worsening symptoms. Sending message to PCP office for call back to pt with further recommendations.      2. ONSET: When did this breathing problem begin?      SOB really happens when walk up little stairs to the door  3. PATTERN Does the difficult breathing come and go, or has it been constant since it started?      Intermittent No SOB on treadmill When walk to mailbox and up steps to house get a little winded or clean house  6. CARDIAC HISTORY: Do you have any history of heart disease? (e.g., heart attack, angina, bypass surgery, angioplasty)      Significant  7. LUNG HISTORY: Do you have any history of lung disease?  (e.g., pulmonary embolus,  asthma, emphysema)     No diagnosis, maybe asthma per pt, been trying to meet with pulm but transportation issues  9. OTHER SYMPTOMS: Do you have any other symptoms? (e.g., chest pain, cough, dizziness, fever, runny nose)     Stressed/overwhelmed/anxiety Insomnia - can't sleep at night, been up all night, think because stuck with living situation Concerns with housing - staying with someone don't get along with, holes in the trailer, mildew, not healthy, no house heat just 2 little heaters Pt confirms no threats or violence with person she's living with and that she feels safe in that regard Car not working, transportation, roommate canceled on her today, was going to bring her to appt Back issues, spasms, very sore when turn a certain way 8/10 but not tried anything to help, for about a week  Denies: Chest pain Violence/threats  Protocols used: Breathing Difficulty-A-AH

## 2024-09-29 NOTE — Telephone Encounter (Signed)
 noted

## 2024-09-29 NOTE — Telephone Encounter (Signed)
 Appt for today 1/21 4:10 pm was created in error - please advise, it states canceled within 24 hours.

## 2024-10-01 NOTE — Telephone Encounter (Signed)
 Will forward to kellen

## 2024-10-04 ENCOUNTER — Other Ambulatory Visit: Payer: Self-pay

## 2024-10-04 ENCOUNTER — Other Ambulatory Visit (INDEPENDENT_AMBULATORY_CARE_PROVIDER_SITE_OTHER): Payer: Self-pay | Admitting: Internal Medicine

## 2024-10-04 DIAGNOSIS — I5022 Chronic systolic (congestive) heart failure: Secondary | ICD-10-CM

## 2024-10-04 DIAGNOSIS — E119 Type 2 diabetes mellitus without complications: Secondary | ICD-10-CM

## 2024-10-05 ENCOUNTER — Other Ambulatory Visit: Payer: Self-pay

## 2024-10-05 MED ORDER — CARVEDILOL 3.125 MG PO TABS
3.1250 mg | ORAL_TABLET | Freq: Two times a day (BID) | ORAL | 1 refills | Status: AC
  Filled 2024-10-05: qty 180, 90d supply, fill #0

## 2024-10-07 ENCOUNTER — Ambulatory Visit: Payer: Self-pay

## 2024-10-07 ENCOUNTER — Ambulatory Visit
Admission: EM | Admit: 2024-10-07 | Discharge: 2024-10-07 | Disposition: A | Discharge status: Home / Self Care | Attending: Family Medicine | Admitting: Family Medicine

## 2024-10-07 DIAGNOSIS — N898 Other specified noninflammatory disorders of vagina: Secondary | ICD-10-CM

## 2024-10-07 DIAGNOSIS — B372 Candidiasis of skin and nail: Secondary | ICD-10-CM | POA: Diagnosis not present

## 2024-10-07 MED ORDER — FLUCONAZOLE 150 MG PO TABS: 150.0000 mg | ORAL_TABLET | Freq: Every day | ORAL | 0 refills | Status: AC

## 2024-10-07 MED ORDER — NYSTATIN 100000 UNIT/GM EX CREA: TOPICAL_CREAM | CUTANEOUS | 1 refills | Status: AC

## 2024-10-07 NOTE — ED Provider Notes (Signed)
 " [facility]    CSN: [id number] Arrival date & time: 10/07/24  1229      History   Chief Complaint No chief complaint on file.   HPI Stacey Richardson is a 59 y.o. female.   Presenting today with itchy red rash below her breasts and in her groin folds and now also extending into her vaginal region.  Denies discharge, new soaps or products, throat itching or swelling, chest tightness, abdominal pain.  She states she tends to gather sweat in these areas and is wondering if she is having a yeast rash.  She does have a history of diabetes.  So far not trying anything over-the-counter for symptoms.    Past Medical History:  Diagnosis Date   CAD (coronary artery disease)    CABG (07/2020; LIMA-LAD, RIMA-OM1)   CVA (cerebral vascular accident) (HCC)    Diabetes mellitus    HFrEF (heart failure with reduced ejection fraction) (HCC)    HLD (hyperlipidemia)    Hypertension    SVT (supraventricular tachycardia)    s/p ablation (11/2020)    Patient Active Problem List   Diagnosis Date Noted   Adrenal nodule 12/25/2022   Colon cancer screening    Ulcer of cecum    Ischemic cardiomyopathy 10/11/2020   Hyperlipidemia associated with type 2 diabetes mellitus (HCC) 10/11/2020   CAD in native artery 08/02/2020   Chronic HFrEF (heart failure with reduced ejection fraction) (HCC) 07/26/2020   Lightheadedness 07/26/2020   History of stroke 07/05/2020   HFrEF (heart failure with reduced ejection fraction) (HCC) 06/20/2020   Cardiomyopathy (HCC) 06/20/2020   Hyperlipidemia LDL goal <70 06/20/2020   Hyperglycemia    Cryptogenic stroke (HCC)    TIA (transient ischemic attack) 06/12/2020   Multifocal pneumonia 01/22/2019   Type 2 diabetes mellitus without complication    Essential hypertension     Past Surgical History:  Procedure Laterality Date   CARDIAC CATHETERIZATION     CLIPPING OF ATRIAL APPENDAGE N/A 08/06/2020   Procedure: CLIPPING OF ATRIAL APPENDAGE USING ATRICURE  CLIP SIZE ;  Surgeon: German Bartlett PEDLAR, MD;  Location: [facility] OR;  Service: Open Heart Surgery;  Laterality: N/A;   COLONOSCOPY WITH PROPOFOL  N/A 09/14/2021   Procedure: COLONOSCOPY WITH PROPOFOL ;  Surgeon: Unk Corinn Skiff, MD;  Location: [facility] ENDOSCOPY;  Service: Gastroenterology;  Laterality: N/A;   CORONARY ARTERY BYPASS GRAFT N/A 08/06/2020   Procedure: CORONARY ARTERY BYPASS GRAFTING (CABG) X2, USING BILATERAL INTERNAL MAMMARY ARTERIES;  Surgeon: German Bartlett PEDLAR, MD;  Location: [facility] OR;  Service: Open Heart Surgery;  Laterality: N/A;   ICD IMPLANT N/A 12/05/2020   Procedure: ICD IMPLANT;  Surgeon: Cindie Ole DASEN, MD;  Location: [facility] INVASIVE CV LAB;  Service: Cardiovascular;  Laterality: N/A;   KNEE SURGERY     RIGHT/LEFT HEART CATH AND CORONARY ANGIOGRAPHY Bilateral 08/02/2020   Procedure: RIGHT/LEFT HEART CATH AND CORONARY ANGIOGRAPHY;  Surgeon: Mady Bruckner, MD;  Location: [facility] INVASIVE CV LAB;  Service: Cardiovascular;  Laterality: Bilateral;   SVT ABLATION N/A 12/05/2020   Procedure: SVT ABLATION;  Surgeon: Cindie Ole DASEN, MD;  Location: [facility] INVASIVE CV LAB;  Service: Cardiovascular;  Laterality: N/A;   TEE WITHOUT CARDIOVERSION N/A 07/18/2020   Procedure: TRANSESOPHAGEAL ECHOCARDIOGRAM (TEE);  Surgeon: Mady Bruckner, MD;  Location: [facility] ORS;  Service: Cardiovascular;  Laterality: N/A;   TEE WITHOUT CARDIOVERSION N/A 08/06/2020   Procedure: TRANSESOPHAGEAL ECHOCARDIOGRAM (TEE);  Surgeon: German Bartlett PEDLAR, MD;  Location: [facility] OR;  Service: Open Heart Surgery;  Laterality:  N/A;    OB History   No obstetric history on file.      Home Medications    Prior to Admission medications  Medication Sig Start Date End Date Taking? Authorizing Provider  fluconazole  (DIFLUCAN ) 150 MG tablet Take 1 tablet (150 mg total) by mouth daily. 10/07/24  Yes Stuart Vernell Norris, PA-C  nystatin  cream (MYCOSTATIN ) Apply to affected areas 2 times daily 10/07/24  Yes Stuart Vernell Norris,  PA-C  acetaminophen  (TYLENOL ) 500 MG tablet Take 1 tablet (500 mg total) by mouth every 6 (six) hours as needed. 02/18/23   Janit Thresa HERO, DPM  alum & mag hydroxide-simeth (MAALOX ADVANCED MAX ST) 400-400-40 MG/5ML suspension Take 15 mLs by mouth every 6 (six) hours as needed for indigestion. 12/09/20   Mesner, Selinda, MD  aspirin  EC 81 MG tablet Take 81 mg by mouth daily.     [provider]  atorvastatin  (LIPITOR ) 80 MG tablet Take 1 tablet (80 mg total) by mouth daily. 06/03/24 12/15/24  End, Lonni, MD  carvedilol  (COREG ) 3.125 MG tablet Take 1 tablet (3.125 mg total) by mouth 2 (two) times daily. 10/05/24   End, Lonni, MD  escitalopram  (LEXAPRO ) 20 MG tablet Take 1 tablet (20 mg total) by mouth daily. 09/20/24   [name], NP  ezetimibe  (ZETIA ) 10 MG tablet Take 1 tablet (10 mg total) by mouth daily. 04/01/24   [name], NP  JARDIANCE  10 MG TABS tablet Take 1 tablet (10 mg total) by mouth daily. 01/17/24   [name], NP  Multiple Vitamin (MULTIVITAMIN WITH MINERALS) TABS tablet Take 1 tablet by mouth daily.    [provider]  nitroGLYCERIN  (NITROSTAT ) 0.4 MG SL tablet Place 1 tablet (0.4 mg total) under the tongue every 5 (five) minutes as needed for chest pain. 09/10/24   End, Lonni, MD  polyethylene glycol powder (GLYCOLAX /MIRALAX ) 17 GM/SCOOP powder Take 17 g by mouth as needed for moderate constipation. 09/24/22   [provider]  sacubitril -valsartan  (ENTRESTO ) 24-26 MG Take 1 tablet by mouth 2 (two) times daily. 09/08/24   End, Lonni, MD  Semaglutide , 1 MG/DOSE, (OZEMPIC , 1 MG/DOSE,) 4 MG/3ML SOPN Inject 1 mg into the skin once a week. 09/20/24   [name], NP  spironolactone  (ALDACTONE ) 25 MG tablet Take 0.5 tablets (12.5 mg total) by mouth daily. 09/27/24   Furth, Cadence H, PA-C  tetrahydrozoline 0.05 % ophthalmic solution Place 1-2 drops into both eyes daily as needed (irritated eyes.). visine 05/04/24    [name], NP    Family History Family History  Problem Relation Age of Onset   Heart disease Mother        a. ?valve   Heart failure Brother    Hypertension Brother    Breast cancer Niece     Social History Social History[1]   Allergies   Lisinopril   Review of Systems Review of Systems Per HPI  Physical Exam Triage Vital Signs ED Triage Vitals  Encounter Vitals Group     BP 10/07/24 1236 109/70     Girls Systolic BP Percentile --      Girls Diastolic BP Percentile --      Boys Systolic BP Percentile --      Boys Diastolic BP Percentile --      Pulse Rate 10/07/24 1236 77     Resp 10/07/24 1236 20     Temp 10/07/24 1236 98.6 F (37 C)     Temp Source 10/07/24 1236 Oral  SpO2 10/07/24 1236 98 %     Weight --      Height --      Head Circumference --      Peak Flow --      Pain Score 10/07/24 1235 0     Pain Loc --      Pain Education --      Exclude from Growth Chart --    No data found.  Updated Vital Signs BP 109/70 (BP Location: Right Arm)   Pulse 77   Temp 98.6 F (37 C) (Oral)   Resp 20   LMP 11/21/2020   SpO2 98%   Visual Acuity Right Eye Distance:   Left Eye Distance:   Bilateral Distance:    Right Eye Near:   Left Eye Near:    Bilateral Near:     Physical Exam Vitals and nursing note reviewed.  Constitutional:      Appearance: Normal appearance. She is not ill-appearing.  HENT:     Head: Atraumatic.  Eyes:     Extraocular Movements: Extraocular movements intact.     Conjunctiva/sclera: Conjunctivae normal.  Cardiovascular:     Rate and Rhythm: Normal rate.  Pulmonary:     Effort: Pulmonary effort is normal.  Musculoskeletal:        General: Normal range of motion.     Cervical back: Normal range of motion and neck supple.  Skin:    General: Skin is warm and dry.     Findings: Rash present.     Comments: Erythematous slightly macerated rashes to area below both breasts and bilateral groin folds  Neurological:      Mental Status: She is alert and oriented to person, place, and time.  Psychiatric:        Mood and Affect: Mood normal.        Thought Content: Thought content normal.        Judgment: Judgment normal.      UC Treatments / Results  Labs (all labs ordered are listed, but only abnormal results are displayed) Labs Reviewed - No data to display  EKG   Radiology No results found.  Procedures Procedures (including critical care time)  Medications Ordered in UC Medications - No data to display  Initial Impression / Assessment and Plan / UC Course  I have reviewed the triage vital signs and the nursing notes.  Pertinent labs & imaging results that were available during my care of the patient were reviewed by me and considered in my medical decision making (see chart for details).     Consistent with yeast dermatitis, will treat with nystatin , Diflucan , and discussed keeping moisture to a minimum in these areas and keeping blood sugars under good control.  Return for worsening or unresolving symptoms.  Final Clinical Impressions(s) / UC Diagnoses   Final diagnoses:  Cutaneous candidiasis  Vaginal itching     Discharge Instructions      Try to keep the skin fold areas clean, dry and you may use unscented powders as needed to help with this.  You may use the nystatin  cream twice daily to these areas until the rash resolves.  The Diflucan  pills should significantly improve your symptoms additionally.  Follow-up for worsening or unresolving symptoms    ED Prescriptions     Medication Sig Dispense Auth. Provider   nystatin  cream (MYCOSTATIN ) Apply to affected areas 2 times daily 80 g Stuart Vernell Norris, PA-C   fluconazole  (DIFLUCAN ) 150 MG tablet Take 1 tablet (  150 mg total) by mouth daily. 7 tablet Stuart Vernell Norris, [place]      PDMP not reviewed this encounter.    [1]  Social History Tobacco Use   Smoking status: Former   Smokeless tobacco: Never    Tobacco comments:    Quit 30 years ago  Vaping Use   Vaping status: Never Used  Substance Use Topics   Alcohol use: Yes    Comment: social   Drug use: No     Stuart Vernell Norris, PA-C 10/07/24 1326  "

## 2024-10-07 NOTE — Discharge Instructions (Signed)
 Try to keep the skin fold areas clean, dry and you may use unscented powders as needed to help with this.  You may use the nystatin  cream twice daily to these areas until the rash resolves.  The Diflucan  pills should significantly improve your symptoms additionally.  Follow-up for worsening or unresolving symptoms

## 2024-10-07 NOTE — ED Triage Notes (Addendum)
 Pt reports rash under the breast, the abdomen and the vagina, has been scratching, which has made it worse , and now seems to be inside the vagina. Started 1 week ago. Pt says she typically sweats a lot and is aware that it can cause moisture.

## 2024-10-07 NOTE — Telephone Encounter (Signed)
 Please schedule pt a sooner appt if possible

## 2024-10-07 NOTE — Telephone Encounter (Signed)
 FYI Only or Action Required?: FYI only for provider: Referred to UC .  Patient was last seen in primary care on 01/14/2024 by [name], NP.  Called Nurse Triage reporting Vaginitis.  Symptoms began a week ago.  Interventions attempted: OTC medications: antibiotic ointment.  Symptoms are: unchanged.  Triage Disposition: See Physician Within 24 Hours  Patient/caregiver understands and will follow disposition?: Yes        Message from Montie POUR sent at 10/07/2024  8:55 AM EST  Reason for Triage:  Ms. Wiedman wants to know if her doctor will call her in something for a yeast infection. Her symptoms are: itching, rash under her breast, under belly and is vagina, very red, burning and getting worse.   Reason for Disposition  MODERATE-SEVERE itching (i.e., interferes with school, work, or sleep)  Answer Assessment - Initial Assessment Questions 1. SYMPTOM: What's the main symptom you're concerned about? (e.g., pain, itching, dryness)     Itchiness, redness   2. LOCATION: Vaginal area, under breasts       3. ONSET: When did the symptoms   start?     X 1 week   4. PAIN: Is there any pain? If Yes, ask: How bad is it? (Scale: 1-10; mild, moderate, severe)     No, burning sensation noted   5. ITCHING: Is there any itching? If Yes, ask: How bad is it? (Scale: 1-10; mild, moderate, severe)     Moderate   6. CAUSE: What do you think is causing the discharge? Have you had the same problem before? What happened then?     Possible yeast infection   7. OTHER SYMPTOMS: Do you have any other symptoms? (e.g., fever, itching, vaginal bleeding, pain with urination, injury to genital area, vaginal foreign body)     No     Patient called in to triage with complaints of itching, rash under her breast, under belly, vagina. This has been ongoing for one week.  The patient stated she suspects a yeast infection. For home care, the patient is taking OTC antibiotic  ointment.   Appointment offered for further evaluation; however due to patients work schedule, none of the available times will work for her, mobile bus declined;  she agrees to be seen in her local UC today.  Patient agrees with the plan of care, and will reach out if symptoms worsen or persist.  Protocols used: Vaginal Symptoms-A-AH

## 2024-10-13 ENCOUNTER — Ambulatory Visit: Admitting: Sleep Medicine

## 2024-10-13 ENCOUNTER — Telehealth (INDEPENDENT_AMBULATORY_CARE_PROVIDER_SITE_OTHER): Payer: Self-pay | Admitting: Primary Care

## 2024-10-13 NOTE — Telephone Encounter (Signed)
 Left VM with pt about their upcoming appt. Pt did not answer

## 2024-10-14 ENCOUNTER — Ambulatory Visit (INDEPENDENT_AMBULATORY_CARE_PROVIDER_SITE_OTHER): Admitting: Primary Care

## 2024-10-14 ENCOUNTER — Encounter (INDEPENDENT_AMBULATORY_CARE_PROVIDER_SITE_OTHER): Payer: Self-pay | Admitting: Primary Care

## 2024-10-14 VITALS — BP 113/73 | HR 79 | Resp 16 | Ht 66.0 in | Wt 197.8 lb

## 2024-10-14 DIAGNOSIS — B9689 Other specified bacterial agents as the cause of diseases classified elsewhere: Secondary | ICD-10-CM

## 2024-10-14 DIAGNOSIS — E1169 Type 2 diabetes mellitus with other specified complication: Secondary | ICD-10-CM

## 2024-10-14 DIAGNOSIS — I10 Essential (primary) hypertension: Secondary | ICD-10-CM

## 2024-10-14 DIAGNOSIS — E119 Type 2 diabetes mellitus without complications: Secondary | ICD-10-CM

## 2024-10-14 MED ORDER — OZEMPIC (1 MG/DOSE) 4 MG/3ML ~~LOC~~ SOPN: 1.0000 mg | PEN_INJECTOR | SUBCUTANEOUS | 2 refills | Status: AC

## 2024-10-15 ENCOUNTER — Other Ambulatory Visit (HOSPITAL_COMMUNITY): Payer: Self-pay

## 2024-10-15 ENCOUNTER — Other Ambulatory Visit: Payer: Self-pay

## 2024-10-15 ENCOUNTER — Encounter (INDEPENDENT_AMBULATORY_CARE_PROVIDER_SITE_OTHER): Payer: Self-pay | Admitting: Primary Care

## 2024-10-15 NOTE — Progress Notes (Signed)
 " [facility]  Stacey Richardson, is a 59 y.o. female  [phone]  FMW:[id number]  DOB - [birth year]  Chief Complaint  Patient presents with   Back Pain       Subjective:   Stacey Richardson is a 59 y.o. female here today for a follow up visit.  Hypertension -patient has No headache, No chest pain, No abdominal pain - No Nausea, No new weakness tingling or numbness, No Cough - shortness of breath. Type 2 diabetes-Denies polyuria, polydipsia, polyphasia or vision changes.  Does not check blood sugars at home.  Acute back pain Back Pain This is a new problem. The current episode started 1 to 4 weeks ago. The problem has been waxing and waning since onset. The pain is present in the lumbar spine. The pain is at a severity of 6/10. The pain is moderate. The pain is Worse during the night. The symptoms are aggravated by bending, twisting, standing and position. Stiffness is present In the morning. Risk factors include obesity. She has tried heat and NSAIDs for the symptoms. The treatment provided mild relief.    No problems updated.  Comprehensive ROS Pertinent positive and negative noted in HPI   Allergies[1]  Past Medical History:  Diagnosis Date   CAD (coronary artery disease)    CABG (07/2020; LIMA-LAD, RIMA-OM1)   CVA (cerebral vascular accident) (HCC)    Diabetes mellitus    HFrEF (heart failure with reduced ejection fraction) (HCC)    HLD (hyperlipidemia)    Hypertension    SVT (supraventricular tachycardia)    s/p ablation (11/2020)    Medications Ordered Prior to Encounter[2] Health Maintenance  Topic Date Due   Medicare Annual Wellness Visit  Never done   COVID-19 Vaccine (1) Never done   Hepatitis B Vaccine (1 of 3 - 19+ 3-dose series) Never done   Zoster (Shingles) Vaccine (1 of 2) Never done   Complete foot exam   12/25/2023   Flu Shot  04/09/2024   Hemoglobin A1C  07/16/2024   Yearly kidney function blood test for diabetes  10/23/2024   Eye exam  for diabetics  12/28/2024   Kidney health urinalysis for diabetes  01/13/2025   Breast Cancer Screening  02/25/2025   Pap with HPV screening  02/10/2026   Colon Cancer Screening  09/15/2031   DTaP/Tdap/Td vaccine (2 - Td or Tdap) 11/06/2032   Pneumococcal Vaccine for age over 36  Completed   HPV Vaccine (No Doses Required) Completed   Hepatitis C Screening  Completed   HIV Screening  Completed   Meningitis B Vaccine  Aged Out    Objective:     Physical Exam Vitals reviewed.  Constitutional:      Appearance: Normal appearance. She is obese.  HENT:     Head: Normocephalic.     Right Ear: Tympanic membrane, ear canal and external ear normal.     Left Ear: Tympanic membrane, ear canal and external ear normal.     Nose: Nose normal.     Mouth/Throat:     Mouth: Mucous membranes are moist.  Eyes:     Extraocular Movements: Extraocular movements intact.     Pupils: Pupils are equal, round, and reactive to light.  Cardiovascular:     Rate and Rhythm: Normal rate and regular rhythm.  Pulmonary:     Effort: Pulmonary effort is normal.     Breath sounds: Normal breath sounds.  Abdominal:     General: Bowel sounds are normal.  Palpations: Abdomen is soft.  Musculoskeletal:        General: Normal range of motion.     Cervical back: Normal range of motion.  Skin:    General: Skin is warm and dry.  Neurological:     Mental Status: She is alert and oriented to person, place, and time.  Psychiatric:        Mood and Affect: Mood normal.        Behavior: Behavior normal.        Thought Content: Thought content normal.     Assessment & Plan  Stacey Richardson was seen today for back pain.  Diagnoses and all orders for this visit:  Type 2 diabetes mellit without complication, without long-term current use of insulin  (HCC) - educated on lifestyle modifications, including but not limited to diet choices and adding exercise to daily routine.   -     Hemoglobin A1c; Future -      Semaglutide , 1 MG/DOSE, (OZEMPIC , 1 MG/DOSE,) 4 MG/3ML SOPN; Inject 1 mg into the skin once a week.  BV (bacterial vaginosis) -     Cervicovaginal ancillary only  Hyperlipidemia associated with type 2 diabetes mellitus (HCC) -     Lipid panel; Future  Essential hypertension Well controlled BP goal - < 130/80 DIET: Limit salt intake, read nutrition labels to check salt content, limit fried and high fatty foods  Avoid using multisymptom OTC cold preparations that generally contain sudafed which can rise BP. Consult with pharmacist on best cold relief products to use for persons with HTN EXERCISE Discussed incorporating exercise such as walking - 30 minutes most days of the week and can do in 10 minute intervals     -     CBC with Differential/Platelet; Future -     CMP14+EGFR; Future     Patient have been counseled extensively about nutrition and exercise. Other issues discussed during this visit include: low cholesterol diet, weight control and daily exercise, foot care, annual eye examinations at Ophthalmology, importance of adherence with medications and regular follow-up. We also discussed long term complications of uncontrolled diabetes and hypertension.   Return in about 3 months (around 01/11/2025).  The patient was given clear instructions to go to ER or return to medical center if symptoms don't improve, worsen or new problems develop. The patient verbalized understanding. The patient was told to call to get lab results if they haven't heard anything in the next week.   This note has been created with Education officer, environmental. Any transcriptional errors are unintentional.   Stacey SHAUNNA Bohr, NP 10/15/2024, 4:15 PM    [1]  Allergies Allergen Reactions   Lisinopril Cough  [2]  Current Outpatient Medications on File Prior to Visit  Medication Sig Dispense Refill   acetaminophen  (TYLENOL ) 500 MG tablet Take 1 tablet (500 mg total) by  mouth every 6 (six) hours as needed. 30 tablet 0   alum & mag hydroxide-simeth (MAALOX ADVANCED MAX ST) 400-400-40 MG/5ML suspension Take 15 mLs by mouth every 6 (six) hours as needed for indigestion. 355 mL 0   aspirin  EC 81 MG tablet Take 81 mg by mouth daily.      atorvastatin  (LIPITOR ) 80 MG tablet Take 1 tablet (80 mg total) by mouth daily. 180 tablet 3   carvedilol  (COREG ) 3.125 MG tablet Take 1 tablet (3.125 mg total) by mouth 2 (two) times daily. 180 tablet 1   escitalopram  (LEXAPRO ) 20 MG tablet Take 1 tablet (20  mg total) by mouth daily. 30 tablet 0   ezetimibe  (ZETIA ) 10 MG tablet Take 1 tablet (10 mg total) by mouth daily. 90 tablet 2   fluconazole  (DIFLUCAN ) 150 MG tablet Take 1 tablet (150 mg total) by mouth daily. 7 tablet 0   JARDIANCE  10 MG TABS tablet Take 1 tablet (10 mg total) by mouth daily. 90 tablet 3   Multiple Vitamin (MULTIVITAMIN WITH MINERALS) TABS tablet Take 1 tablet by mouth daily.     nitroGLYCERIN  (NITROSTAT ) 0.4 MG SL tablet Place 1 tablet (0.4 mg total) under the tongue every 5 (five) minutes as needed for chest pain. 25 tablet 3   nystatin  cream (MYCOSTATIN ) Apply to affected areas 2 times daily 80 g 1   polyethylene glycol powder (GLYCOLAX /MIRALAX ) 17 GM/SCOOP powder Take 17 g by mouth as needed for moderate constipation.     sacubitril -valsartan  (ENTRESTO ) 24-26 MG Take 1 tablet by mouth 2 (two) times daily. 180 tablet 3   spironolactone  (ALDACTONE ) 25 MG tablet Take 0.5 tablets (12.5 mg total) by mouth daily. 45 tablet 1   tetrahydrozoline 0.05 % ophthalmic solution Place 1-2 drops into both eyes daily as needed (irritated eyes.). visine 15 mL 1   No current facility-administered medications on file prior to visit.   "

## 2024-10-19 ENCOUNTER — Ambulatory Visit: Admitting: Sleep Medicine
